# Patient Record
Sex: Female | Born: 1940
Health system: Midwestern US, Community
[De-identification: ages and names within clinical notes are randomized; demographics above are authoritative.]

## PROBLEM LIST (undated history)

## (undated) DIAGNOSIS — R413 Other amnesia: Secondary | ICD-10-CM

## (undated) DIAGNOSIS — R5381 Other malaise: Secondary | ICD-10-CM

## (undated) DIAGNOSIS — K219 Gastro-esophageal reflux disease without esophagitis: Secondary | ICD-10-CM

## (undated) DIAGNOSIS — M542 Cervicalgia: Secondary | ICD-10-CM

## (undated) DIAGNOSIS — F419 Anxiety disorder, unspecified: Secondary | ICD-10-CM

## (undated) DIAGNOSIS — B9620 Unspecified Escherichia coli [E. coli] as the cause of diseases classified elsewhere: Secondary | ICD-10-CM

## (undated) DIAGNOSIS — C44311 Basal cell carcinoma of skin of nose: Secondary | ICD-10-CM

## (undated) DIAGNOSIS — D72829 Elevated white blood cell count, unspecified: Secondary | ICD-10-CM

## (undated) DIAGNOSIS — M549 Dorsalgia, unspecified: Secondary | ICD-10-CM

## (undated) DIAGNOSIS — R9439 Abnormal result of other cardiovascular function study: Secondary | ICD-10-CM

## (undated) DIAGNOSIS — R1312 Dysphagia, oropharyngeal phase: Secondary | ICD-10-CM

## (undated) DIAGNOSIS — M4319 Spondylolisthesis, multiple sites in spine: Secondary | ICD-10-CM

## (undated) DIAGNOSIS — M810 Age-related osteoporosis without current pathological fracture: Principal | ICD-10-CM

## (undated) DIAGNOSIS — M461 Sacroiliitis, not elsewhere classified: Secondary | ICD-10-CM

## (undated) DIAGNOSIS — M8580 Other specified disorders of bone density and structure, unspecified site: Secondary | ICD-10-CM

## (undated) DIAGNOSIS — M8589 Other specified disorders of bone density and structure, multiple sites: Secondary | ICD-10-CM

## (undated) DIAGNOSIS — M545 Low back pain, unspecified: Secondary | ICD-10-CM

## (undated) DIAGNOSIS — J449 Chronic obstructive pulmonary disease, unspecified: Principal | ICD-10-CM

## (undated) DIAGNOSIS — M47816 Spondylosis without myelopathy or radiculopathy, lumbar region: Secondary | ICD-10-CM

## (undated) DIAGNOSIS — R42 Dizziness and giddiness: Secondary | ICD-10-CM

## (undated) DIAGNOSIS — R300 Dysuria: Secondary | ICD-10-CM

## (undated) DIAGNOSIS — E785 Hyperlipidemia, unspecified: Secondary | ICD-10-CM

## (undated) DIAGNOSIS — J432 Centrilobular emphysema: Principal | ICD-10-CM

## (undated) DIAGNOSIS — R0902 Hypoxemia: Secondary | ICD-10-CM

## (undated) DIAGNOSIS — N390 Urinary tract infection, site not specified: Principal | ICD-10-CM

## (undated) DIAGNOSIS — J984 Other disorders of lung: Secondary | ICD-10-CM

## (undated) DIAGNOSIS — R1084 Generalized abdominal pain: Secondary | ICD-10-CM

## (undated) DIAGNOSIS — Z9889 Other specified postprocedural states: Principal | ICD-10-CM

## (undated) DIAGNOSIS — I160 Hypertensive urgency: Secondary | ICD-10-CM

## (undated) DIAGNOSIS — R251 Tremor, unspecified: Secondary | ICD-10-CM

## (undated) DIAGNOSIS — R030 Elevated blood-pressure reading, without diagnosis of hypertension: Secondary | ICD-10-CM

## (undated) DIAGNOSIS — R350 Frequency of micturition: Principal | ICD-10-CM

## (undated) DIAGNOSIS — M48061 Spinal stenosis, lumbar region without neurogenic claudication: Secondary | ICD-10-CM

## (undated) DIAGNOSIS — N3001 Acute cystitis with hematuria: Secondary | ICD-10-CM

## (undated) DIAGNOSIS — J069 Acute upper respiratory infection, unspecified: Principal | ICD-10-CM

## (undated) DIAGNOSIS — Z4789 Encounter for other orthopedic aftercare: Secondary | ICD-10-CM

## (undated) DIAGNOSIS — R1310 Dysphagia, unspecified: Secondary | ICD-10-CM

## (undated) DIAGNOSIS — G8918 Other acute postprocedural pain: Principal | ICD-10-CM

## (undated) DIAGNOSIS — R1906 Epigastric swelling, mass or lump: Secondary | ICD-10-CM

## (undated) DIAGNOSIS — M4807 Spinal stenosis, lumbosacral region: Secondary | ICD-10-CM

## (undated) DIAGNOSIS — Z1231 Encounter for screening mammogram for malignant neoplasm of breast: Secondary | ICD-10-CM

---

## 2011-11-11 NOTE — Telephone Encounter (Signed)
ERROR.

## 2011-11-17 NOTE — Telephone Encounter (Signed)
Patient is on Macrobid 100mg  BID.  She wanted to inform us of this so we will know when she gets her injection in December.

## 2011-11-21 NOTE — Telephone Encounter (Signed)
To add meds to her chart.

## 2011-11-21 NOTE — Telephone Encounter (Signed)
Error.

## 2013-11-06 NOTE — Telephone Encounter (Signed)
Pt scheduled to see Katie Juarez on 11/08/13. Will call for pt's paper chart.

## 2013-11-06 NOTE — Telephone Encounter (Signed)
Pt has not been seen for couple yrs and would like to come back in for an injection.   Please call to schedule

## 2013-11-06 NOTE — Telephone Encounter (Signed)
Left message to call office

## 2013-11-06 NOTE — Telephone Encounter (Signed)
Okay to schedule for H&P. Please provide paper chart

## 2013-11-08 ENCOUNTER — Ambulatory Visit: Admit: 2013-11-08 | Discharge: 2013-11-08 | Payer: MEDICARE | Attending: Nurse Practitioner | Primary: Family Medicine

## 2013-11-08 DIAGNOSIS — M48061 Spinal stenosis, lumbar region without neurogenic claudication: Secondary | ICD-10-CM

## 2013-11-08 NOTE — Progress Notes (Signed)
Subjective:      Patient ID: Katie Juarez is a 73 y.o. female.    HPI Here to schedule L4/L5 IESI. Last received 2013, worked very well. The pain is the same, prefers to schedule the same procedure. Also complaining neck pain, denies cervical radicular symptoms.     Allergies   Allergen Reactions   ??? Pcn [Penicillins]    ??? Zithromax [Azithromycin]        Outpatient Prescriptions Marked as Taking for the 11/08/13 encounter (Office Visit) with [name], NP   Medication Sig Dispense Refill   ??? atorvastatin (LIPITOR) 20 MG tablet      ??? NEXIUM 40 MG capsule      ??? FLUZONE HIGH-DOSE 0.5 ML SUSY injection   0   ??? RA LORATADINE 10 MG tablet   0   ??? PREVNAR 13 SUSP inj   0   ??? Multiple Vitamins-Iron (MULTI-VITAMIN/IRON) TABS Take  by mouth Daily.     ??? CALCIUM CARBONATE-VIT D-MIN PO Take  by mouth 2 times daily.     ??? aspirin 81 MG tablet Take 81 mg by mouth daily.     ??? citalopram (CELEXA) 10 MG tablet Take 10 mg by mouth. 1/2 tablet by mouth at bedtime     ??? LORazepam (ATIVAN) 0.5 MG tablet Take 0.5 mg by mouth. 1 tablet by mouth at bedtime PRN     ??? budesonide-formoterol (SYMBICORT) 160-4.5 MCG/ACT AERO Inhale 1 puff into the lungs 2 times daily.     ??? bisacodyl (DULCOLAX) 5 MG EC tablet Take 5 mg by mouth daily as needed.     ??? polyvinyl alcohol-povidone (HYPOTEARS) 1.4-0.6 % ophthalmic solution Place 1-2 drops into both eyes as needed.     ??? nitrofurantoin, macrocrystal-monohydrate, (MACROBID) 100 MG capsule Take 100 mg by mouth 2 times daily.         Past Medical History   Diagnosis Date   ??? Breast cancer ([place])    ??? SS (spinal stenosis)    ??? DDD (degenerative disc disease)    ??? LBP (low back pain)        Past Surgical History   Procedure Laterality Date   ??? Hysterectomy     ??? Bladder suspension     ??? Cholecystectomy     ??? Breast lumpectomy     ??? Spine surgery  06/23/11, 06/30/11, 07/12/11 12/29/11     L4/L5 IESI     Review of Systems   Constitutional: Positive for fatigue.   Respiratory: Negative for shortness  of breath.    Cardiovascular: Positive for leg swelling. Negative for chest pain.   Gastrointestinal: Positive for constipation.   Genitourinary: Negative for difficulty urinating.   Musculoskeletal: Positive for myalgias, back pain, joint swelling and arthralgias.   Neurological: Positive for weakness and numbness.   Psychiatric/Behavioral: Positive for sleep disturbance.       Objective:   Physical Exam   Constitutional: She is oriented to person, place, and time. Vital signs are normal. She appears well-developed and well-nourished. She is cooperative. No distress.   HENT:   Head: Normocephalic and atraumatic.   Cardiovascular: Normal rate and intact distal pulses.    Pulmonary/Chest: Effort normal. No respiratory distress.   Musculoskeletal:        Lumbar back: She exhibits decreased range of motion, tenderness and pain.   Neurological: She is alert and oriented to person, place, and time. She is not disoriented. No cranial nerve deficit.   Skin: Skin  is warm, dry and intact. She is not diaphoretic. No cyanosis. No pallor. Nails show no clubbing.   Psychiatric: She has a normal mood and affect. Her speech is normal.   Vitals reviewed.      Assessment:      1. Spinal stenosis of lumbar region    2. Midline low back pain without sciatica    3. Degeneration of intervertebral disc of lumbar region           Plan:     Schedule L4/L5 IESI    Informed consent has been obtained for procedure. Roselyn Reef Dishongis  on blood thinners. Medications to hold have been reviewed with patient.Blood thinners must be held with permission from your cardiologist or primary care physician. A letter is  required to besent to PCP/Cardiologist regarding holding medications for procedure to decrease bleeding risk. Blood Thinners (antiplatelet/) medications that must be held prior to spinal cord stimulator trials, and implants, interlaminar epidurals, caudal epidurals, or transforaminal epidural injections:    Antiplatelets Aspirin,  Effient, Plavix,Ticlid,  Brilinta, Savaysa for 3-5 days  Anticoagulant: Coumadin, Eliquis, Lovenox, Heparin, Pradaxa, Xarelto for 5-7 days.  All antiplatelets/anticoagulants must be held for 7 days for a spinal cord stimulator.

## 2013-11-08 NOTE — Patient Instructions (Signed)
[facility]  [address] [place], [place] [address]  Telephone [phone]  Fax [phone]    PROCEDURE INSTRUCTIONS FOR  PAIN MANAGEMENT PROCEDURES WITHOUT SEDATION    You are scheduled to see Dr. Joylene Igo to undergo the following procedure: ___L4/L5 Interlaminar Epidural Steroid Injection___    Procedure Date: __Friday  11/6/15__  Arrival Time: ___7:30 AM___     Report to the [facility], Registration office on the 1st floor in the hospital, after check in and signing of paper work you will then go to the second floor to the surgery center.    1. Stop the following medications prior to the procedure: ASPIRIN  Date_11/3/15_ : Stop blood thinners as directed before the injection, with permission from your cardiologist or primary care physician. We will send a letter to them requesting permission to hold the blood thinners.     Blood Thinners (antiplatelet/) medications that must be held prior to injections:    Antiplatelets: Effient, Plavix,Ticlid,  Brilinta, Savaysa for 3 days  Anticoagulant: Coumadin,Eliquis, Lovenox, Heparin, Pradaxa, Xarelto for 5-7 days.     If you are having an interlaminar epidural, caudal epidural, or transforaminal epidural injection performed, ASA must also be held for 3 days, with permission from your cardiologist or primary care physician.      If you take Warfarin (Coumadin), you must have your blood drawn for an INR the day before the procedure. INR must be less than 1.5.    2.  Take all routine medications unless otherwise instructed. Ok to take vitamins and antiinflammatory medications    3.  EATING & DRINKING:  Ok to eat or drink before the injection - no IV sedation will be used.     4. If you are allergic to contrast or iodine, you must take benadryl and prednisone prior to the injection to prevent an allergic reaction. Follow the directions on the prescription for the times to take the medication.    5. Oral valium can be prescribed if your are  anxious about the injections or feel that you can not lay still during the injection. If you take valium, you must have a driver. Make arrangements for a family member or friend to drive you to the surgery center.  Your ride must stay in the hospital while you are having the injection done. If they cannot stay, the injection will be rescheduled. The valium may affect your judgment following the procedure and driving a vehicle within 24 hours after the sedation could be dangerous.    6.  Wear simple loose clothing, which can be easily changed.      7.  Leave jewelry (including rings) and other valuables at home.     8.  You will be asked to sign several forms prior to surgery; patients under the age of 62 must have a parent or legal guardian sign the permit to be able to do the procedure.      9.  You must have finished any antibiotic prescribed for recent infections. If required, please take pre-procedure antibiotic or other pre-procedure medications as instructed.      10. Bring inhalers and pain medications with you to your procedure.      11. Bring your MRI/CT films if they were done outside of the [facility].    12. If you should develop a cold, sore throat, cough, fever or other new indication of illness or infection prior to the scheduled procedure, please notify the Doctor???s office  as early as possible at [phone].

## 2013-11-14 NOTE — H&P (Signed)
11/08/2013 11:25 AM        Related encounter: Office Visit from 11/08/2013 in [facility]      Attestation signed by Brien Few, MD at 11/11/2013 8:14 AM   I have reviewed the encounter and agree with the above assessment and plan.     Electronically signed by Brien Few, MD on 11/11/2013 at 8:14 AM        Expand All Collapse All   Subjective:    Patient ID: Katie Juarez is a 73 y.o. female.    HPI Here to schedule L4/L5 IESI. Last received 2013, worked very well. The pain is the same, prefers to schedule the same procedure. Also complaining neck pain, denies cervical radicular symptoms.   Allergies    Allergen  Reactions    ???  Pcn [Penicillins]     ???  Zithromax [Azithromycin]        Active Medications    Outpatient Prescriptions Marked as Taking for the 11/08/13 encounter (Office Visit) with [name], NP    Medication  Sig  Dispense  Refill    ???  atorvastatin (LIPITOR) 20 MG tablet       ???  NEXIUM 40 MG capsule       ???  FLUZONE HIGH-DOSE 0.5 ML SUSY injection    0    ???  RA LORATADINE 10 MG tablet    0    ???  PREVNAR 13 SUSP inj    0    ???  Multiple Vitamins-Iron (MULTI-VITAMIN/IRON) TABS  Take by mouth Daily.      ???  CALCIUM CARBONATE-VIT D-MIN PO  Take by mouth 2 times daily.      ???  aspirin 81 MG tablet  Take 81 mg by mouth daily.      ???  citalopram (CELEXA) 10 MG tablet  Take 10 mg by mouth. 1/2 tablet by mouth at bedtime      ???  LORazepam (ATIVAN) 0.5 MG tablet  Take 0.5 mg by mouth. 1 tablet by mouth at bedtime PRN      ???  budesonide-formoterol (SYMBICORT) 160-4.5 MCG/ACT AERO  Inhale 1 puff into the lungs 2 times daily.      ???  bisacodyl (DULCOLAX) 5 MG EC tablet  Take 5 mg by mouth daily as needed.      ???  polyvinyl alcohol-povidone (HYPOTEARS) 1.4-0.6 % ophthalmic solution  Place 1-2 drops into both eyes as needed.      ???  nitrofurantoin, macrocrystal-monohydrate, (MACROBID) 100 MG capsule  Take 100 mg by mouth 2 times daily.            Past Medical History    Past Medical  History    Diagnosis  Date    ???  Breast cancer ([place])     ???  SS (spinal stenosis)     ???  DDD (degenerative disc disease)     ???  LBP (low back pain)           Past Surgical History    Past Surgical History    Procedure  Laterality  Date    ???  Hysterectomy      ???  Bladder suspension      ???  Cholecystectomy      ???  Breast lumpectomy      ???  Spine surgery   06/23/11, 06/30/11, 07/12/11 12/29/11      L4/L5 IESI         Review of  Systems   Constitutional: Positive for fatigue.   Respiratory: Negative for shortness of breath.   Cardiovascular: Positive for leg swelling. Negative for chest pain.   Gastrointestinal: Positive for constipation.   Genitourinary: Negative for difficulty urinating.   Musculoskeletal: Positive for myalgias, back pain, joint swelling and arthralgias.   Neurological: Positive for weakness and numbness.   Psychiatric/Behavioral: Positive for sleep disturbance.     Objective:    Physical Exam   Constitutional: She is oriented to person, place, and time. Vital signs are normal. She appears well-developed and well-nourished. She is cooperative. No distress.   HENT:   Head: Normocephalic and atraumatic.   Cardiovascular: Normal rate and intact distal pulses.   Pulmonary/Chest: Effort normal. No respiratory distress.   Musculoskeletal:   Lumbar back: She exhibits decreased range of motion, tenderness and pain.   Neurological: She is alert and oriented to person, place, and time. She is not disoriented. No cranial nerve deficit.   Skin: Skin is warm, dry and intact. She is not diaphoretic. No cyanosis. No pallor. Nails show no clubbing.   Psychiatric: She has a normal mood and affect. Her speech is normal.   Vitals reviewed.    Assessment:       1.  Spinal stenosis of lumbar region     2.  Midline low back pain without sciatica    3.  Degeneration of intervertebral disc of lumbar region           Plan:      Schedule L4/L5 IESI    Informed consent has been obtained for procedure. Roselyn Reef Dishongis  on blood  thinners. Medications to hold have been reviewed with patient.Blood thinners must be held with permission from your cardiologist or primary care physician. A letter is required to besent to PCP/Cardiologist regarding holding medications for procedure to decrease bleeding risk. Blood Thinners (antiplatelet/) medications that must be held prior to spinal cord stimulator trials, and implants, interlaminar epidurals, caudal epidurals, or transforaminal epidural injections:   Antiplatelets Aspirin, Effient, Plavix,Ticlid, Brilinta, Savaysa for 3-5 days  Anticoagulant: Coumadin, Eliquis, Lovenox, Heparin, Pradaxa, Xarelto for 5-7 days.  All antiplatelets/anticoagulants must be held for 7 days for a spinal cord stimulator.

## 2013-11-15 ENCOUNTER — Encounter: Admit: 2013-11-15 | Attending: Physical Medicine & Rehabilitation | Primary: Family Medicine

## 2013-11-15 ENCOUNTER — Encounter: Admit: 2013-11-15 | Primary: Family Medicine

## 2013-11-15 MED ORDER — BUFFERED LIDOCAINE 1%
Status: AC
Start: 2013-11-15 — End: ?

## 2013-11-15 MED ORDER — BUPIVACAINE HCL (PF) 0.5 % IJ SOLN
0.5 % | INTRAMUSCULAR | Status: AC
Start: 2013-11-15 — End: ?

## 2013-11-15 MED ORDER — DEXAMETHASONE SODIUM PHOSPHATE 10 MG/ML IJ SOLN
10 MG/ML | INTRAMUSCULAR | Status: AC
Start: 2013-11-15 — End: ?

## 2013-11-15 MED FILL — DEXAMETHASONE SODIUM PHOSPHATE 10 MG/ML IJ SOLN: 10 MG/ML | INTRAMUSCULAR | Qty: 2

## 2013-11-15 MED FILL — BUFFERED LIDOCAINE 1%: Qty: 20

## 2013-11-15 MED FILL — BUPIVACAINE HCL (PF) 0.5 % IJ SOLN: 0.5 % | INTRAMUSCULAR | Qty: 30

## 2013-11-15 NOTE — Discharge Instructions (Signed)
Home Care after Epidural Steroid Injection    The doctor has done an injection in your back to see if those nerves are causing your pain and to decrease pain and inflammation.     You may feel sore at the injection site for the next 2-4 days. You may apply ice to the site for 20 minutes on and 20 minutes off to decrease pain and discomfort, if needed, for the first 24 hours. After 24 hours, you may use heat if needed.    Your pain may subside right away, or it may take a number of days. This is because two medicines were used in the injection. The first, a local anesthetic, will only work for a few hours. The second, a steroid, may not start working for 2-5 days. Some patients have noticed no changes in their pain for up to 2 weeks.    There may be a time after the local anesthetic wears off that you feel like you have more pain. This is called pain flare. If this happens:  . Limit your activities for the first 24 hours to those that you can do without pain.  . Keep on taking your pain medicine as prescribed.    Sometimes, some patients have had facial and neck flushing, anxiety or nervousness, and mood swings with the use of steroids. These symptoms most often occur within the first 24-48 hours and do not require any treatment. They should go away on their own within one week.    If you have diabetes, steroids will cause your blood sugar to increase. Make sure your primary doctor is aware of this and that you have orders to treat your blood sugar to keep it within your normal range.    You may have some weakness for the next 3-5 hours due to the anesthetic used. Take it easy. No baths or soaking the injection site for 24 hours after the procedure. Taking a shower is okay.    You may resume taking your routine medicines after the procedure including pain medicines as prescribed. Resume any medications held for the procedure (blood thinners, aspirin, anti-inflammatories).    If you do not already have a  follow-up appointment, call your referring doctor's office to make one to discuss your results within 2-4 weeks after the treatment. Your doctor will have the report within 7-10 days.    You will be given a pain log to complete for the next 14 days. Complete this form and make a copy for your own records. Then, mail it back to us or drop it off at the pain management clinic. We will need this information to decide the next step in your treatment plan.    Signs of infection:  . Fever greater than 100.4F by mouth for 2 readings taken 4 hours apart  . Increased redness, swelling around the site  . Any drainage from the site     If you have any new symptoms or any signs of infection, please call [phone] during business hours to notify us. You can also notify your primary care physician.After hours, nights and weekends, call [phone].

## 2013-11-15 NOTE — Interval H&P Note (Signed)
I have interviewed and examined the patient and reviewed the recent History and Physical.  There have been no changes to the recent H&P documentation. The surgical consent form has been signed.      Outpatient Prescriptions Marked as Taking for the 11/15/13 encounter [facility] Encounter) with Brien Few, MD   Medication Sig Dispense Refill   ??? NEXIUM 40 MG capsule      ??? RA LORATADINE 10 MG tablet   0   ??? Multiple Vitamins-Iron (MULTI-VITAMIN/IRON) TABS Take  by mouth Daily.     ??? CALCIUM CARBONATE-VIT D-MIN PO Take  by mouth 2 times daily.     ??? citalopram (CELEXA) 10 MG tablet Take 10 mg by mouth. 1/2 tablet by mouth at bedtime     ??? LORazepam (ATIVAN) 0.5 MG tablet Take 0.5 mg by mouth. 1 tablet by mouth at bedtime PRN     ??? budesonide-formoterol (SYMBICORT) 160-4.5 MCG/ACT AERO Inhale 1 puff into the lungs 2 times daily.     ??? nitrofurantoin, macrocrystal-monohydrate, (MACROBID) 100 MG capsule Take 100 mg by mouth 2 times daily.         The patient understands the planned operation and its associated risks and benefits and agrees to proceed.        Electronically signed by Brien Few, MD on 11/15/2013 at 8:26 AM

## 2013-11-15 NOTE — Op Note (Signed)
INTERLAMINAR EPIDURAL STEROID INJECTION    11/15/13    Surgeon: Brien Few, MD    Pre-operative Diagnosis:   Active Hospital Problems    Diagnosis Date Noted   ??? SS (spinal stenosis) [M48.00]    ??? DDD (degenerative disc disease) [IMO0002]    ??? Low back pain [M54.5]        Post-operative Diagnosis: Same    Assistants: none    This is a 73 y.o. female patient with pain in the Back.?? Pain intensity is 7 on a 0 to 10 numeric pain scale.  Previous treatment and examination findings are noted in the H&P.??L4/L5 interlaminar epidural injection has been requested for diagnostic and therapeutic reasons.    EXAMINATION:  1. L4/L5 Interlaminar radiculogram/epidurogram.   2. L4/L5 Interlaminar epidural anesthetic injection.   3. L4/L5 Interlaminar epidural steroid injection.    CONSENT: Written consent was obtained from the patient on preprinted consent form after explaining the procedure, indications, potential complications and outcomes. Alternative treatments were also discussed.    DISCUSSION: The patient was sterilely prepped and draped in the usual fashion in the prone position. ???Time out??? was verified for correct patient, side, level and procedure.    SEDATION:   No conscious sedation was performed during the procedure.?? The patient remained awake and conversed throughout the procedure.?? The patient underwent pulse oximetry and blood pressure monitoring independently by a trained observer, as well as by a physician.    PROCEDURE:  Under image-intensifier control, a 22 gauge needle x 3.5 inch spinal needle was guided successfully into the epidural space employing a posterior midline interlaminar approach. Needle aspiration was negative for heme or CSF.Instillation of  .5 mL of Omnipaque 240 contrast medium opacified the spinal nerve and demonstrated contiguous flow into the epidural space. No vascular spread was noted. Digital subtraction was not employed to evaluate for vascular spread. The patient was monitored  for any untoward reaction to contrast medium before proceeding with procedure #2. The patient did report pain reproduction in a concordant distribution.    Following needle position verification, a test dose of  1 mL of sterile normal saline was administered and patient monitored for any adverse effects. Then, 1.5 mL of dexamethasone (Decadron 10 mg/mL) was instilled into the epidural space and the patient's response was again monitored.  Finally, 56ml of 0.5% bupivacaine was then instilled. The patient's response was again monitored.    The spinal needle was removed, and the patient's pain response, gait pattern, sensorimotor examination and range of motion were examined.    The patient tolerated the procedure well and without complications and was noted to be in stable condition prior to discharge from the procedure center with discharge instructions.    IMPRESSIONS:  1. L4/L5 Interlaminar epidurogram, epidural anesthesia and epidural steroid injection procedures accomplished without incident.     EBL: no blood loss    SPECIMEN: none    RECOMMENDATIONS:  1. Complete and return ???Post-Procedure Pain and Activity Diary.???   2. Contact the [facility] for symptom exacerbation, fever or unusual symptoms.   3. Post-procedure care according to verbal and written discharge instructions    POST-PROCEDURE EPIDUROGRAPHY INTERPRETATION:    EXAMINATION: AP, lateral views.    FLUORO TIME: 23 seconds    DISCUSSION: Spot views of the spine reveal normal alignment and segmentation.  Spinal needle is positioned at the L4/L5 interlaminar space. Contrast spreads and outlines the epidural space. The epidurogram reveals good contrast flow. Visualized spine reveals See radiology  report.Soft tissues reveal no abnormalities.    IMPRESSION: L4/L5 interlaminar epidurogram/epineurogram reveals satisfactory needle position and contrast spread.     Electronically signed by Brien Few, MD on 11/15/2013 at 8:29 AM

## 2013-11-15 NOTE — Other (Signed)
Xray 23 seconds  Procedure time 5 minutes

## 2013-11-19 LAB — SURGICAL PATHOLOGY REPORT

## 2013-12-02 NOTE — Telephone Encounter (Signed)
Patient called c/o worsening LBP and trouble with neck pain. Stated that she will be leaving for [place] on 12/20/13 and will be gone until April. Please advise what patient should do since she had her injections on 11/6 & 11/11.  [phone]

## 2013-12-03 NOTE — Telephone Encounter (Signed)
What injection did she have on 11/20/2013? Recommend sooner appt, do discuss her neck pain and any other recommendations for her back

## 2013-12-03 NOTE — Telephone Encounter (Signed)
Patient didn't have procedure on 11/20/13, she had a L4/L5 IESI on 11/15/13 with no f/u scheduled.  She was originally a referral for injection therapy.  At [facility] surgery center our soonest available is on 12/27/13, but we do have [facility] surgery center next Wednesday.  They are leaving for [place] on 12/20/13 and wouldn't be able to get into our [place] surgery center.  I called patient to see if she was interested in [place] surgery center but she wasn't available, and I spoke to her husband.  He stated that if she cannot have another procedure done prior to leaving for [place] she would like to discuss pain meds.  She is to call our office back.

## 2013-12-04 ENCOUNTER — Ambulatory Visit: Admit: 2013-12-04 | Payer: MEDICARE | Primary: Family Medicine

## 2013-12-04 ENCOUNTER — Ambulatory Visit: Admit: 2013-12-04 | Discharge: 2013-12-04 | Payer: MEDICARE | Attending: Nurse Practitioner | Primary: Family Medicine

## 2013-12-04 DIAGNOSIS — M5136 Other intervertebral disc degeneration, lumbar region: Secondary | ICD-10-CM

## 2013-12-04 MED ORDER — IBUPROFEN 600 MG PO TABS
600 MG | ORAL_TABLET | Freq: Three times a day (TID) | ORAL | Status: DC | PRN
Start: 2013-12-04 — End: 2017-04-24

## 2013-12-04 NOTE — Progress Notes (Signed)
Subjective:      Patient ID: Katie Juarez is a 73 y.o. female.    HPI L4/L5 IESI 11/14/13, helped initially the first 3 days, pain gradually returning. She had the same injection 2013, the pain is the same, no change in pain. Taking Tylenol/Motrin PRN. Leaving December 11th for [place]. Also complaining neck pain, radiates upwards causing headache. Denies radicular complaints. Back pain is low back, radiates into the buttocks. Denies groin pain.     Allergies   Allergen Reactions   ??? Pcn [Penicillins]    ??? Zithromax [Azithromycin]        Outpatient Prescriptions Marked as Taking for the 12/04/13 encounter (Office Visit) with [name], NP   Medication Sig Dispense Refill   ??? vitamin B-12 (CYANOCOBALAMIN) 1000 MCG tablet Take 1,000 mcg by mouth daily     ??? atorvastatin (LIPITOR) 20 MG tablet      ??? NEXIUM 40 MG capsule      ??? FLUZONE HIGH-DOSE 0.5 ML SUSY injection   0   ??? RA LORATADINE 10 MG tablet   0   ??? PREVNAR 13 SUSP inj   0   ??? Multiple Vitamins-Iron (MULTI-VITAMIN/IRON) TABS Take  by mouth Daily.     ??? CALCIUM CARBONATE-VIT D-MIN PO Take  by mouth 2 times daily.     ??? aspirin 81 MG tablet Take 81 mg by mouth daily.     ??? citalopram (CELEXA) 10 MG tablet Take 10 mg by mouth. 1/2 tablet by mouth at bedtime     ??? LORazepam (ATIVAN) 0.5 MG tablet Take 0.5 mg by mouth. 1 tablet by mouth at bedtime PRN     ??? budesonide-formoterol (SYMBICORT) 160-4.5 MCG/ACT AERO Inhale 1 puff into the lungs 2 times daily.     ??? bisacodyl (DULCOLAX) 5 MG EC tablet Take 5 mg by mouth daily as needed.     ??? polyvinyl alcohol-povidone (HYPOTEARS) 1.4-0.6 % ophthalmic solution Place 1-2 drops into both eyes as needed.     ??? nitrofurantoin, macrocrystal-monohydrate, (MACROBID) 100 MG capsule Take 100 mg by mouth 2 times daily.         Past Medical History   Diagnosis Date   ??? Breast cancer ([place])    ??? SS (spinal stenosis)    ??? DDD (degenerative disc disease)    ??? LBP (low back pain)        Past Surgical History   Procedure  Laterality Date   ??? Hysterectomy     ??? Bladder suspension     ??? Cholecystectomy     ??? Breast lumpectomy     ??? Spine surgery  06/23/11, 06/30/11, 07/12/11 12/29/11     L4/L5 IESI   ??? Other surgical history  11/14/2013     L4/L5 IESI     Review of Systems   Constitutional: Positive for fatigue.   Respiratory: Negative for shortness of breath.    Cardiovascular: Positive for leg swelling. Negative for chest pain.   Gastrointestinal: Positive for constipation.   Genitourinary: Negative for difficulty urinating.   Musculoskeletal: Positive for myalgias, back pain, joint swelling, arthralgias and neck pain.   Neurological: Positive for weakness, numbness and headaches.   Psychiatric/Behavioral: Positive for sleep disturbance.       Objective:   Physical Exam   Constitutional: She is oriented to person, place, and time. Vital signs are normal. She appears well-developed and well-nourished. She is cooperative. No distress.   HENT:   Head: Normocephalic and atraumatic.  Cardiovascular: Normal rate and intact distal pulses.    Pulmonary/Chest: Effort normal. No respiratory distress.   Musculoskeletal:        Lumbar back: She exhibits decreased range of motion, tenderness and pain.        Back:    Neurological: She is alert and oriented to person, place, and time. She is not disoriented. No cranial nerve deficit.   Skin: Skin is warm, dry and intact. She is not diaphoretic. No cyanosis. No pallor. Nails show no clubbing.   Psychiatric: She has a normal mood and affect. Her speech is normal.   Vitals reviewed.      Assessment:      1. Degeneration of intervertebral disc of lumbar region    2. Lumbar spinal stenosis    3. Cervicalgia             Plan:    Schedule repeat L4/L5 IESI vs L5/S1 (as the pain seems lower, radiates into buttocks), although it has been two years since previous set of injections, L4/L5 level may be so flared up one injection wasn't going to work. Recommend updated lumbar imaging to rule out worsening of that  level and changes. Cspine Xray followed by cervical MRI as she is also interested in cervical injections.    Informed consent has been obtained for procedure. Roselyn Reef Dishongis  on blood thinners. Medications to hold have been reviewed with patient.Blood thinners must be held with permission from your cardiologist or primary care physician. A letter is  required to besent to PCP/Cardiologist regarding holding medications for procedure to decrease bleeding risk. Blood Thinners (antiplatelet/) medications that must be held prior to spinal cord stimulator trials, and implants, interlaminar epidurals, caudal epidurals, or transforaminal epidural injections:    Antiplatelets Aspirin, Effient, Plavix,Ticlid,  Brilinta, Savaysa for 3-5 days  Anticoagulant: Coumadin, Eliquis, Lovenox, Heparin, Pradaxa, Xarelto for 5-7 days.  All antiplatelets/anticoagulants must be held for 7 days for a spinal cord stimulator.

## 2013-12-04 NOTE — Telephone Encounter (Signed)
Evaluated in clinic 12/04/13

## 2013-12-04 NOTE — Patient Instructions (Addendum)
Okay to take Motrin 600mg  up to 3x's per day        [facility]  [address] [place], [place] [address]  Telephone [phone]  Fax [phone]    PROCEDURE INSTRUCTIONS FOR  PAIN MANAGEMENT PROCEDURES WITHOUT IV SEDATION    Katie Juarez scheduled to see Dr. Eulah Citizen to undergo the following procedure:  L4/L5 IESI vs L5/S1 IESI    Procedure Date: _____12/8/15_____  Arrival Time: _____07:00AM_____     Report to the [facility], Registration office on the 1st floor in the hospital, after check in and signing of paper work you will then go to the second floor to the surgery center.    1. Stop the following medications prior to the procedure:  Date__12/1/15__ : Stop blood thinners as directed before the injection, with permission from your cardiologist or primary care physician. We will send a letter to them requesting permission to hold the blood thinners.     ?? Medication____Aspirin____ held for __7__ days    If you take Warfarin (Coumadin), you must have your blood drawn for an INR the day before the procedure. INR must be less than 1.5.    2.  Take all routine medications unless otherwise instructed. Ok to take vitamins and antiinflammatory medications    3.  EATING & DRINKING:  Ok to eat or drink before the injection - no IV sedation will be used.     4. If you are allergic to contrast or iodine, you must take benadryl and prednisone prior to the injection to prevent an allergic reaction. Follow the directions on the prescription for the times to take the medication.    5. Oral valium can be prescribed if your are anxious about the injections or feel that you can not lay still during the injection. If you take valium, you must have a driver. Make arrangements for a family member or friend to drive you to the surgery center.  Your ride must stay in the hospital while you are having the injection done. If they cannot stay, the injection will be rescheduled. The valium may affect  your judgment following the procedure and driving a vehicle within 24 hours after the sedation could be dangerous.    6.  Wear simple loose clothing, which can be easily changed.      7.  Leave jewelry (including rings) and other valuables at home.     8.  You will be asked to sign several forms prior to surgery; patients under the age of 23 must have a parent or legal guardian sign the permit to be able to do the procedure.      9.  You must have finished any antibiotic prescribed for recent infections. If required, please take pre-procedure antibiotic or other pre-procedure medications as instructed.      10. Bring inhalers and pain medications with you to your procedure.      11. Bring your MRI/CT films if they were done outside of the [facility].    12. If you should develop a cold, sore throat, cough, fever or other new indication of illness or infection, or are started on antibiotics within 2 weeks of the scheduled procedure, please notify the Doctor???s office as early as possible at [phone].          ORAL SEDATION:  Ativan  take 1 tablet 12 hours prior to procedure (at 0800PM), may take 1 additional tablet 2 hours prior to procedure (  at 06:00AM) if needed.  MUST HAVE A DRIVER TO AND FROM THE PROCEDURE!!!

## 2013-12-12 ENCOUNTER — Encounter

## 2013-12-13 ENCOUNTER — Ambulatory Visit: Admit: 2013-12-13 | Payer: MEDICARE | Primary: Family Medicine

## 2013-12-17 ENCOUNTER — Encounter: Admit: 2013-12-17 | Primary: Family Medicine

## 2013-12-17 ENCOUNTER — Inpatient Hospital Stay: Payer: MEDICARE | Attending: Physical Medicine & Rehabilitation | Primary: Family Medicine

## 2013-12-17 ENCOUNTER — Encounter: Admit: 2013-12-17 | Attending: Physical Medicine & Rehabilitation | Primary: Family Medicine

## 2013-12-17 DIAGNOSIS — M4319 Spondylolisthesis, multiple sites in spine: Secondary | ICD-10-CM

## 2013-12-17 MED ORDER — BUPIVACAINE HCL (PF) 0.5 % IJ SOLN
0.5 % | INTRAMUSCULAR | Status: AC
Start: 2013-12-17 — End: ?

## 2013-12-17 MED ORDER — BUFFERED LIDOCAINE 1%
Status: AC
Start: 2013-12-17 — End: ?

## 2013-12-17 MED FILL — BUFFERED LIDOCAINE 1%: Qty: 20

## 2013-12-17 MED FILL — BUPIVACAINE HCL (PF) 0.5 % IJ SOLN: 0.5 % | INTRAMUSCULAR | Qty: 30

## 2013-12-17 NOTE — Interval H&P Note (Signed)
I have interviewed and examined the patient and reviewed the recent History and Physical.  There have been no changes to the recent H&P documentation. The surgical consent form has been signed.    Outpatient Prescriptions Marked as Taking for the 12/17/13 encounter [facility] Encounter) with Brien Few, MD   Medication Sig Dispense Refill   ??? NEXIUM 40 MG capsule      ??? Multiple Vitamins-Iron (MULTI-VITAMIN/IRON) TABS Take  by mouth Daily.     ??? CALCIUM CARBONATE-VIT D-MIN PO Take  by mouth 2 times daily.     ??? aspirin 81 MG tablet Take 81 mg by mouth daily.     ??? citalopram (CELEXA) 10 MG tablet Take 10 mg by mouth. 1/2 tablet by mouth at bedtime     ??? LORazepam (ATIVAN) 0.5 MG tablet Take 0.5 mg by mouth. 1 tablet by mouth at bedtime PRN     ??? budesonide-formoterol (SYMBICORT) 160-4.5 MCG/ACT AERO Inhale 1 puff into the lungs 2 times daily.     ??? bisacodyl (DULCOLAX) 5 MG EC tablet Take 5 mg by mouth daily as needed.     ??? polyvinyl alcohol-povidone (HYPOTEARS) 1.4-0.6 % ophthalmic solution Place 1-2 drops into both eyes as needed.     ??? nitrofurantoin, macrocrystal-monohydrate, (MACROBID) 100 MG capsule Take 100 mg by mouth 2 times daily.         The patient understands the planned operation and its associated risks and benefits and agrees to proceed.        Electronically signed by Brien Few, MD on 12/17/2013 at 8:20 AM

## 2013-12-17 NOTE — Op Note (Signed)
INTERLAMINAR EPIDURAL STEROID INJECTION    12/17/13    Surgeon: Brien Few, MD    Pre-operative Diagnosis:   Active Hospital Problems    Diagnosis Date Noted   ??? 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 DDD (degenerative disc disease) [[id number]]        Post-operative Diagnosis: Same    Assistants: none    This is a 73 y.o. female patient with pain in the Back.?? Pain intensity is 6 on a 0 to 10 numeric pain scale.  Previous treatment and examination findings are noted in the H&P.??  L5/S1 interlaminar epidural injection has been requested for diagnostic and therapeutic reasons.    EXAMINATION:  1. L5/S1 Interlaminar radiculogram/epidurogram.   2. L5/S1 Interlaminar epidural anesthetic injection.   3. L5/S1 Interlaminar epidural steroid injection.    CONSENT: Written consent was obtained from the patient on preprinted consent form after explaining the procedure, indications, potential complications and outcomes. Alternative treatments were also discussed.    DISCUSSION: The patient was sterilely prepped and draped in the usual fashion in the prone position. ???Time out??? was verified for correct patient, side, level and procedure.    SEDATION: No conscious sedation was performed during the procedure.?? The patient remained awake and conversed throughout the procedure.?? The patient underwent pulse oximetry and blood pressure monitoring independently by a trained observer, as well as by a physician.    PROCEDURE:  Under image-intensifier control, a 22 gauge needle x 3.5 inch spinal needle was guided successfully into the epidural space employing a posterior midline  interlaminar approach. Needle aspiration was negative for heme or CSF.Instillation of  1 mL of Omnipaque 240 contrast medium opacified the spinal nerve and demonstrated contiguous flow into the epidural space. No vascular spread was noted. Digital subtraction was not employed to evaluate for  vascular spread. The patient was monitored for any untoward reaction to contrast medium before proceeding with procedure #2. The patient did not report pain reproduction in a concordant distribution.    Following needle position verification, a test dose of  1 mL of sterile normal saline was administered and patient monitored for any adverse effects. Then, 1.5 mL of dexamethasone (Decadron 10 mg/mL) was instilled into the epidural space and the patient's response was again monitored.  Finally, 67ml of 0.5% bupivacaine was then instilled. The patient's response was again monitored.    The spinal needle was removed, and the patient's pain response, gait pattern, sensorimotor examination and range of motion were examined.    The patient tolerated the procedure well and without complications and was noted to be in stable condition prior to discharge from the procedure center with discharge instructions.    IMPRESSIONS:  1. L5/S1 Interlaminar epidurogram, epidural anesthesia and epidural steroid injection procedures accomplished without incident.     EBL: no blood loss    SPECIMEN: none    RECOMMENDATIONS:  1. Complete and return ???Post-Procedure Pain and Activity Diary.???   2. Contact the [facility] for symptom exacerbation, fever or unusual symptoms.   3. Post-procedure care according to verbal and written discharge instructions    POST-PROCEDURE EPIDUROGRAPHY INTERPRETATION:    EXAMINATION: AP, lateral views.    FLUORO TIME: 19 seconds    DISCUSSION: Spot views of the spine reveal normal alignment and segmentation.  Spinal needle is positioned at the L5/S1 interlaminar space. Contrast spreads and outlines the epidural space. The epidurogram reveals good contrast flow. Visualized  spine reveals Spondylolisthesis- L3, L4, L5, S1, Degenerative Joint Disease- L3, L4, L5, S1.Soft tissues reveal no abnormalities.    IMPRESSION:L5/S1  interlaminar epidurogram/epineurogram reveals satisfactory needle position and  contrast spread.     Electronically signed by Brien Few, MD on 12/17/2013 at 7:59 AM

## 2013-12-17 NOTE — Discharge Instructions (Signed)
Home Care after Epidural Steroid Injection    The doctor has done an injection in your back to see if those nerves are causing your pain and to decrease pain and inflammation.     You may feel sore at the injection site for the next 2-4 days. You may apply ice to the site for 20 minutes on and 20 minutes off to decrease pain and discomfort, if needed, for the first 24 hours. After 24 hours, you may use heat if needed.    Your pain may subside right away, or it may take a number of days. This is because two medicines were used in the injection. The first, a local anesthetic, will only work for a few hours. The second, a steroid, may not start working for 2-5 days. Some patients have noticed no changes in their pain for up to 2 weeks.    There may be a time after the local anesthetic wears off that you feel like you have more pain. This is called pain flare. If this happens:  . Limit your activities for the first 24 hours to those that you can do without pain.  . Keep on taking your pain medicine as prescribed.    Sometimes, some patients have had facial and neck flushing, anxiety or nervousness, and mood swings with the use of steroids. These symptoms most often occur within the first 24-48 hours and do not require any treatment. They should go away on their own within one week.    If you have diabetes, steroids will cause your blood sugar to increase. Make sure your primary doctor is aware of this and that you have orders to treat your blood sugar to keep it within your normal range.    You may have some weakness for the next 3-5 hours due to the anesthetic used. Take it easy. No baths or soaking the injection site for 24 hours after the procedure. Taking a shower is okay.    You may resume taking your routine medicines after the procedure including pain medicines as prescribed. Resume any medications held for the procedure (blood thinners, aspirin, anti-inflammatories).    If you do not already have a  follow-up appointment, call your referring doctor's office to make one to discuss your results within 2-4 weeks after the treatment. Your doctor will have the report within 7-10 days.    You will be given a pain log to complete for the next 14 days. Complete this form and make a copy for your own records. Then, mail it back to us or drop it off at the pain management clinic. We will need this information to decide the next step in your treatment plan.    Signs of infection:  . Fever greater than 100.4F by mouth for 2 readings taken 4 hours apart  . Increased redness, swelling around the site  . Any drainage from the site     If you have any new symptoms or any signs of infection, please call [phone] during business hours to notify us. You can also notify your primary care physician.After hours, nights and weekends, call [phone].

## 2013-12-17 NOTE — H&P (Signed)
[name], NP Nurse Practitioner Attested  Progress Notes 12/04/2013 10:10 AM   Related encounter: Office Visit from 12/04/2013 in [facility]      Attestation signed by Brien Few, MD at 12/09/2013 8:17 AM   I have reviewed the encounter and agree with the above assessment and plan.     Electronically signed by Brien Few, MD on 12/09/2013 at 8:17 AM            Expand All Collapse All   Subjective:        Patient ID: Katie Juarez is a 73 y.o. female.    HPI L4/L5 IESI 11/14/13, helped initially the first 3 days, pain gradually returning. She had the same injection 2013, the pain is the same, no change in pain. Taking Tylenol/Motrin PRN. Leaving December 11th for [place]. Also complaining neck pain, radiates upwards causing headache. Denies radicular complaints. Back pain is low back, radiates into the buttocks. Denies groin pain.       Allergies    Allergen  Reactions    ???  Pcn [Penicillins]      ???  Zithromax [Azithromycin]             Active Medications    Outpatient Prescriptions Marked as Taking for the 12/04/13 encounter (Office Visit) with [name], NP    Medication  Sig  Dispense  Refill    ???  vitamin B-12 (CYANOCOBALAMIN) 1000 MCG tablet  Take 1,000 mcg by mouth daily        ???  atorvastatin (LIPITOR) 20 MG tablet          ???  NEXIUM 40 MG capsule          ???  FLUZONE HIGH-DOSE 0.5 ML SUSY injection      0    ???  RA LORATADINE 10 MG tablet      0    ???  PREVNAR 13 SUSP inj      0    ???  Multiple Vitamins-Iron (MULTI-VITAMIN/IRON) TABS  Take  by mouth Daily.        ???  CALCIUM CARBONATE-VIT D-MIN PO  Take  by mouth 2 times daily.        ???  aspirin 81 MG tablet  Take 81 mg by mouth daily.        ???  citalopram (CELEXA) 10 MG tablet  Take 10 mg by mouth. 1/2 tablet by mouth at bedtime        ???  LORazepam (ATIVAN) 0.5 MG tablet  Take 0.5 mg by mouth. 1 tablet by mouth at bedtime PRN        ???  budesonide-formoterol (SYMBICORT) 160-4.5 MCG/ACT AERO  Inhale 1 puff into the lungs 2  times daily.        ???  bisacodyl (DULCOLAX) 5 MG EC tablet  Take 5 mg by mouth daily as needed.        ???  polyvinyl alcohol-povidone (HYPOTEARS) 1.4-0.6 % ophthalmic solution  Place 1-2 drops into both eyes as needed.        ???  nitrofurantoin, macrocrystal-monohydrate, (MACROBID) 100 MG capsule  Take 100 mg by mouth 2 times daily.                  Past Medical History    Past Medical History    Diagnosis  Date    ???  Breast cancer ([place])      ???  SS (spinal stenosis)      ???  DDD (degenerative disc disease)      ???  LBP (low back pain)                Past Surgical History    Past Surgical History    Procedure  Laterality  Date    ???  Hysterectomy        ???  Bladder suspension        ???  Cholecystectomy        ???  Breast lumpectomy        ???  Spine surgery    06/23/11, 06/30/11, 07/12/11 12/29/11        L4/L5 IESI    ???  Other surgical history    11/14/2013        L4/L5 IESI         Review of Systems   Constitutional: Positive for fatigue.   Respiratory: Negative for shortness of breath.    Cardiovascular: Positive for leg swelling. Negative for chest pain.   Gastrointestinal: Positive for constipation.   Genitourinary: Negative for difficulty urinating.   Musculoskeletal: Positive for myalgias, back pain, joint swelling, arthralgias and neck pain.   Neurological: Positive for weakness, numbness and headaches.   Psychiatric/Behavioral: Positive for sleep disturbance.        Objective:    Physical Exam   Constitutional: She is oriented to person, place, and time. Vital signs are normal. She appears well-developed and well-nourished. She is cooperative. No distress.   HENT:    Head: Normocephalic and atraumatic.   Cardiovascular: Normal rate and intact distal pulses.    Pulmonary/Chest: Effort normal. No respiratory distress.   Musculoskeletal:        Lumbar back: She exhibits decreased range of motion, tenderness and pain.        Back:    Neurological: She is alert and oriented to person, place, and time. She is not disoriented. No  cranial nerve deficit.   Skin: Skin is warm, dry and intact. She is not diaphoretic. No cyanosis. No pallor. Nails show no clubbing.   Psychiatric: She has a normal mood and affect. Her speech is normal.   Vitals reviewed.        Assessment:       1.  Degeneration of intervertebral disc of lumbar region     2.  Lumbar spinal stenosis     3.  Cervicalgia                        Plan:     Schedule repeat L4/L5 IESI vs L5/S1 (as the pain seems lower, radiates into buttocks), although it has been two years since previous set of injections, L4/L5 level may be so flared up one injection wasn't going to work. Recommend updated lumbar imaging to rule out worsening of that level and changes. Cspine Xray followed by cervical MRI as she is also interested in cervical injections.    Informed consent has been obtained for procedure. Roselyn Reef Dishongis  on blood thinners. Medications to hold have been reviewed with patient.Blood thinners must be held with permission from your cardiologist or primary care physician. A letter is  required to besent to PCP/Cardiologist regarding holding medications for procedure to decrease bleeding risk. Blood Thinners (antiplatelet/) medications that must be held prior to spinal cord stimulator trials, and implants, interlaminar epidurals, caudal epidurals, or transforaminal epidural injections:     Antiplatelets Aspirin, Effient, Plavix,Ticlid,  Brilinta, Savaysa for 3-5 days  Anticoagulant: Coumadin, Eliquis,  Lovenox, Heparin, Pradaxa, Xarelto for 5-7 days.  All antiplatelets/anticoagulants must be held for 7 days for a spinal cord stimulator.                                Cosigned by: Brien Few, MD at 12/09/2013  8:17 AM   Revision History       Date/Time User Provider Type Action     12/09/2013  8:17 AM Brien Few, MD Physician Cosign     12/04/2013 12:47 PM [name], NP Nurse Practitioner Sign

## 2013-12-18 ENCOUNTER — Encounter

## 2013-12-18 NOTE — Telephone Encounter (Signed)
Faxed 12/04/13 medication hiatus letter to Dr Beau Fanny at [phone] on 12/09/13. Received confirmation of fax transmission at 14:10.

## 2014-04-30 ENCOUNTER — Encounter: Attending: Nurse Practitioner | Primary: Family Medicine

## 2014-05-02 ENCOUNTER — Ambulatory Visit: Admit: 2014-05-02 | Discharge: 2014-05-02 | Payer: MEDICARE | Attending: Family | Primary: Family Medicine

## 2014-05-02 DIAGNOSIS — N390 Urinary tract infection, site not specified: Secondary | ICD-10-CM

## 2014-05-02 LAB — MICROSCOPIC URINALYSIS
Epithelial Cells UA: 5 /HPF (ref 0–5)
RBC, UA: 5 /HPF (ref 0–4)
WBC, UA: >100 /HPF (ref 0–4)

## 2014-05-02 LAB — UA W/REFLEX CULTURE
Bilirubin Urine: NEGATIVE
Glucose, Ur: NEGATIVE
Ketones, Urine: NEGATIVE
Nitrite, Urine: NEGATIVE
Protein, UA: NEGATIVE
Specific Gravity, UA: 1.03 — ABNORMAL HIGH (ref 1.010–1.025)
Urobilinogen, Urine: NORMAL
pH, UA: 5 (ref 5.0–6.0)

## 2014-05-02 MED ORDER — LEVOFLOXACIN 500 MG PO TABS
500 MG | ORAL_TABLET | Freq: Every day | ORAL | Status: DC
Start: 2014-05-02 — End: 2014-05-09

## 2014-05-02 NOTE — Patient Instructions (Signed)
Urinary Tract Infection in Women: Care Instructions  Your Care Instructions     A urinary tract infection, or UTI, is a general term for an infection anywhere between the kidneys and the urethra (where urine comes out). Most UTIs are bladder infections. They often cause pain or burning when you urinate.  UTIs are caused by bacteria and can be cured with antibiotics. Be sure to complete your treatment so that the infection goes away.  Follow-up care is a key part of your treatment and safety. Be sure to make and go to all appointments, and call your doctor if you are having problems. It's also a good idea to know your test results and keep a list of the medicines you take.  How can you care for yourself at home?  ?? Take your antibiotics as directed. Do not stop taking them just because you feel better. You need to take the full course of antibiotics.  ?? Drink extra water and other fluids for the next day or two. This may help wash out the bacteria that are causing the infection. (If you have kidney, heart, or liver disease and have to limit fluids, talk with your doctor before you increase your fluid intake.)  ?? Avoid drinks that are carbonated or have caffeine. They can irritate the bladder.  ?? Urinate often. Try to empty your bladder each time.  ?? To relieve pain, take a hot bath or lay a heating pad set on low over your lower belly or genital area. Never go to sleep with a heating pad in place.  To prevent UTIs  ?? Drink plenty of water each day. This helps you urinate often, which clears bacteria from your system. (If you have kidney, heart, or liver disease and have to limit fluids, talk with your doctor before you increase your fluid intake.)  ?? Consider adding cranberry juice to your diet.  ?? Urinate when you need to.  ?? Urinate right after you have sex.  ?? Change sanitary pads often.  ?? Avoid douches, bubble baths, feminine hygiene sprays, and other feminine hygiene products that have deodorants.  ?? After  going to the bathroom, wipe from front to back.  When should you call for help?  Call your doctor now or seek immediate medical care if:  ?? Symptoms such as fever, chills, nausea, or vomiting get worse or appear for the first time.  ?? You have new pain in your back just below your rib cage. This is called flank pain.  ?? There is new blood or pus in your urine.  ?? You have any problems with your antibiotic medicine.  Watch closely for changes in your health, and be sure to contact your doctor if:  ?? You are not getting better after taking an antibiotic for 2 days.  ?? Your symptoms go away but then come back.   Where can you learn more?   Go to [email or web address] and sign in to your MyChart account. Enter [id number] in the Search Health Information box to learn more about ???Urinary Tract Infection in Women: Care Instructions.???    If you do not have an account, please click on the ???Sign Up Now??? link.     ?? 2006-2015 [employer]. Care instructions adapted under license by [facility]. This care instruction is for use with your licensed healthcare professional. If you have questions about a medical condition or this instruction, always ask your healthcare professional. [employer] disclaims any warranty or liability for your   use of this information.  Content Version: 10.6.465758; Current as of: September 18, 2012

## 2014-05-02 NOTE — Progress Notes (Addendum)
Subjective:      Patient ID: Katie Juarez is a 74 y.o. female.    Urinary Tract Infection   This is a new problem. The current episode started 1 to 4 weeks ago. The problem occurs every urination. The quality of the pain is described as burning. The pain is at a severity of 6/10. She is sexually active. Associated symptoms include frequency and urgency. Pertinent negatives include no nausea. Treatments tried: Keflex. The treatment provided no relief.       Review of Systems   Constitutional: Negative.    Respiratory: Negative.    Cardiovascular: Negative.    Gastrointestinal: Negative for nausea.   Genitourinary: Positive for dysuria, urgency and frequency.   Musculoskeletal: Negative.    Skin: Negative.    Neurological: Negative.        Objective:   Physical Exam   Constitutional: She is oriented to person, place, and time. She appears well-developed.   HENT:   Head: Normocephalic and atraumatic.   Right Ear: Tympanic membrane, external ear and ear canal normal.   Left Ear: Tympanic membrane, external ear and ear canal normal.   Nose: Nose normal.   Mouth/Throat: Uvula is midline, oropharynx is clear and moist and mucous membranes are normal.   Eyes: Conjunctivae, EOM and lids are normal. Pupils are equal, round, and reactive to light.   Neck: Trachea normal and normal range of motion.   Cardiovascular: Normal rate, regular rhythm, normal heart sounds and normal pulses.    Pulmonary/Chest: Effort normal and breath sounds normal.   Musculoskeletal: Normal range of motion.   Neurological: She is alert and oriented to person, place, and time.   Skin: Skin is warm, dry and intact.     Filed Vitals:    05/02/14 1746   BP: 140/78   Pulse: 60   Temp: 97.9 ??F (36.6 ??C)   Resp: 16   SpO2: 97%     Results for orders placed or performed in visit on 05/02/14   UA W/Reflex Culture   Result Value Ref Range    Color, UA NOT REPORTED YEL    Turbidity UA NOT REPORTED CLEAR    Glucose, Ur NEGATIVE NEG    Bilirubin Urine NEGATIVE  NEG    Ketones, Urine NEGATIVE NEG    Specific Gravity, UA 1.030 (H) 1.010 - 1.025    Urine Hgb 2+ (A) NEG    pH, UA 5.0 5.0 - 6.0    Protein, UA NEGATIVE NEG    Urobilinogen, Urine Normal NORM    Nitrite, Urine NEGATIVE NEG    Leukocyte Esterase, Urine 3+ (A) NEG    Urinalysis Comments NOT REPORTED      U/A:    Lab Results   Component Value Date    COLORU NOT REPORTED 05/05/2014    PHUR 5.0 05/05/2014    WBCUA 10 TO 25 05/05/2014    RBCUA 5 TO 10 05/05/2014    MUCUS NOT REPORTED 05/05/2014    TRICHOMONAS NOT REPORTED 05/05/2014    YEAST NOT REPORTED 05/05/2014    BACTERIA 2+ 05/05/2014    SPECGRAV 1.025 05/05/2014    LEUKOCYTESUR 2+ 05/05/2014    UROBILINOGEN 4 mg/dL 05/05/2014    BILIRUBINUR NEGATIVE 05/05/2014    GLUCOSEU TRACE 05/05/2014    AMORPHOUS NOT REPORTED 05/05/2014         Assessment:      1. Urinary tract infection, site unspecified  levofloxacin (LEVAQUIN) 500 MG tablet   2. Dysuria  UA W/Reflex  Culture    levofloxacin (LEVAQUIN) 500 MG tablet     05/05/14: Patient was switched to Bactrim due to worsening symptoms from 05/02/14.         Plan:      Patient advised to drink plenty of fluids and take medication as prescribed. Advised to follow up with family doctor or return to urgent care/ emergency room if symptoms worsen with high fever, chills, vomiting, increased back or flank pain, abdominal pain or any new symptoms develops.  Stop taking Celexa while taking Levaquin.  Patient states she is usually treated with Levaquin as Cipro and Bactrim do not work for her.    05/05/14: Switch from Levaquin to Bactrim.  Start Pyridium.  Will call when Urine culture results made available if Bactrim not going to be effective.    Allergies   Allergen Reactions   ??? Pcn [Penicillins]    ??? Zithromax [Azithromycin]      Current Outpatient Prescriptions   Medication Sig Dispense Refill   ??? levofloxacin (LEVAQUIN) 500 MG tablet Take 1 tablet by mouth daily for 7 days Take with food. 7 tablet 0   ??? ibuprofen (ADVIL;MOTRIN)  600 MG tablet Take 1 tablet by mouth every 8 hours as needed for Pain 120 tablet 3   ??? atorvastatin (LIPITOR) 20 MG tablet      ??? NEXIUM 40 MG capsule      ??? RA LORATADINE 10 MG tablet   0   ??? PREVNAR 13 SUSP inj   0   ??? Multiple Vitamins-Iron (MULTI-VITAMIN/IRON) TABS Take  by mouth Daily.     ??? CALCIUM CARBONATE-VIT D-MIN PO Take  by mouth 2 times daily.     ??? aspirin 81 MG tablet Take 81 mg by mouth daily.     ??? citalopram (CELEXA) 10 MG tablet Take 10 mg by mouth. 1/2 tablet by mouth at bedtime     ??? LORazepam (ATIVAN) 0.5 MG tablet Take 0.5 mg by mouth. 1 tablet by mouth at bedtime PRN     ??? budesonide-formoterol (SYMBICORT) 160-4.5 MCG/ACT AERO Inhale 1 puff into the lungs 2 times daily.     ??? polyvinyl alcohol-povidone (HYPOTEARS) 1.4-0.6 % ophthalmic solution Place 1-2 drops into both eyes as needed.     ??? nitrofurantoin, macrocrystal-monohydrate, (MACROBID) 100 MG capsule Take 100 mg by mouth 2 times daily.     ??? sulfamethoxazole-trimethoprim (BACTRIM DS) 800-160 MG per tablet Take 1 tablet by mouth 2 times daily for 7 days 14 tablet 0   ??? phenazopyridine (PYRIDIUM) 200 MG tablet Take 1 tablet by mouth 3 times daily as needed for Pain 12 tablet 0   ??? vitamin B-12 (CYANOCOBALAMIN) 1000 MCG tablet Take 1,000 mcg by mouth daily     ??? FLUZONE HIGH-DOSE 0.5 ML SUSY injection   0   ??? bisacodyl (DULCOLAX) 5 MG EC tablet Take 5 mg by mouth daily as needed.       No current facility-administered medications for this visit.

## 2014-05-05 ENCOUNTER — Encounter: Admit: 2014-05-05 | Discharge: 2014-05-05 | Payer: MEDICARE | Primary: Family Medicine

## 2014-05-05 ENCOUNTER — Encounter

## 2014-05-05 ENCOUNTER — Telehealth: Payer: MEDICARE

## 2014-05-05 LAB — UA W/REFLEX CULTURE
Bilirubin Urine: NEGATIVE
Ketones, Urine: NEGATIVE
Nitrite, Urine: POSITIVE — AB
Specific Gravity, UA: 1.025 (ref 1.010–1.025)
Urobilinogen, Urine: 4 — AB
pH, UA: 5 (ref 5.0–6.0)

## 2014-05-05 LAB — MICROSCOPIC URINALYSIS
Epithelial Cells UA: 0 /HPF (ref 0–5)
RBC, UA: 5 /HPF (ref 0–4)
WBC, UA: 10 /HPF (ref 0–4)

## 2014-05-05 MED ORDER — SULFAMETHOXAZOLE-TRIMETHOPRIM 800-160 MG PO TABS
800-160 MG | ORAL_TABLET | Freq: Two times a day (BID) | ORAL | Status: AC
Start: 2014-05-05 — End: 2014-05-12

## 2014-05-05 MED ORDER — PHENAZOPYRIDINE HCL 200 MG PO TABS
200 MG | ORAL_TABLET | Freq: Three times a day (TID) | ORAL | Status: AC | PRN
Start: 2014-05-05 — End: 2014-05-08

## 2014-05-05 NOTE — Telephone Encounter (Signed)
Patient called stating she is not feeling better, still having urinary burning and frequency.  Spoke with Lanice Schwab and verbal order for UA and culture placed in computer.  Patient will stop in today to have done.

## 2014-05-05 NOTE — Telephone Encounter (Signed)
UA done today and notes placed under lab result for change in medication.

## 2014-05-08 LAB — CULTURE, URINE

## 2014-05-09 ENCOUNTER — Ambulatory Visit: Admit: 2014-05-09 | Discharge: 2014-05-09 | Payer: MEDICARE | Attending: Nurse Practitioner | Primary: Family Medicine

## 2014-05-09 DIAGNOSIS — M533 Sacrococcygeal disorders, not elsewhere classified: Secondary | ICD-10-CM

## 2014-05-09 NOTE — Telephone Encounter (Signed)
Faxed medication hiatus letter to Dr Beau Fanny at [phone] on 05/09/14. Received confirmation of fax transmission at [phone].

## 2014-05-09 NOTE — Progress Notes (Signed)
Subjective:      Patient ID: Katie Juarez is a 74 y.o. female.    HPI Underwent IESI's previously in 11/2013 and 12/2013, no longer effective. Low back pain, pain in buttocks as well as posterior thigh. Worse with sitting.    Allergies   Allergen Reactions   ??? Pcn [Penicillins]    ??? Zithromax [Azithromycin]        Outpatient Prescriptions Marked as Taking for the 05/09/14 encounter (Office Visit) with [name], NP   Medication Sig Dispense Refill   ??? sulfamethoxazole-trimethoprim (BACTRIM DS) 800-160 MG per tablet Take 1 tablet by mouth 2 times daily for 7 days 14 tablet 0   ??? vitamin B-12 (CYANOCOBALAMIN) 1000 MCG tablet Take 1,000 mcg by mouth daily     ??? ibuprofen (ADVIL;MOTRIN) 600 MG tablet Take 1 tablet by mouth every 8 hours as needed for Pain 120 tablet 3   ??? atorvastatin (LIPITOR) 20 MG tablet Take 20 mg by mouth daily      ??? NEXIUM 40 MG capsule Take 40 mg by mouth every morning (before breakfast)      ??? RA LORATADINE 10 MG tablet Take 10 mg by mouth daily   0   ??? Multiple Vitamins-Iron (MULTI-VITAMIN/IRON) TABS Take  by mouth Daily.     ??? CALCIUM CARBONATE-VIT D-MIN PO Take  by mouth 2 times daily.     ??? aspirin 81 MG tablet Take 81 mg by mouth daily.     ??? citalopram (CELEXA) 10 MG tablet Take 10 mg by mouth. 1/2 tablet by mouth at bedtime     ??? LORazepam (ATIVAN) 0.5 MG tablet Take 0.5 mg by mouth. 1 tablet by mouth at bedtime PRN     ??? budesonide-formoterol (SYMBICORT) 160-4.5 MCG/ACT AERO Inhale 1 puff into the lungs 2 times daily.     ??? polyvinyl alcohol-povidone (HYPOTEARS) 1.4-0.6 % ophthalmic solution Place 1-2 drops into both eyes as needed.     ??? nitrofurantoin, macrocrystal-monohydrate, (MACROBID) 100 MG capsule Take 100 mg by mouth 2 times daily.         Past Medical History   Diagnosis Date   ??? Breast cancer ([place])    ??? SS (spinal stenosis)    ??? DDD (degenerative disc disease)    ??? LBP (low back pain)    ??? 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 Hysterectomy     ??? Bladder suspension     ??? Cholecystectomy     ??? Breast lumpectomy     ??? Other surgical history  11/14/13, 06/23/11, 06/30/11, 07/12/11 12/29/11     L4/L5 IESI   ??? Other surgical history  12/17/13     L5/S1 IESI       Review of Systems   Constitutional: Positive for fatigue.   Respiratory: Negative for shortness of breath.    Cardiovascular: Positive for leg swelling. Negative for chest pain.   Gastrointestinal: Positive for constipation.   Genitourinary: Negative for difficulty urinating.   Musculoskeletal: Positive for myalgias, back pain, joint swelling, arthralgias and neck pain.   Neurological: Positive for weakness, numbness and headaches.   Psychiatric/Behavioral: Positive for sleep disturbance.       Objective:   Physical Exam   Constitutional: She is oriented to person, place, and time. Vital signs are normal. She appears well-developed and well-nourished. She is cooperative. No distress.   HENT:  Head: Normocephalic and atraumatic.   Cardiovascular: Normal rate and intact distal pulses.    Pulmonary/Chest: Effort normal. No respiratory distress.   Musculoskeletal:        Lumbar back: She exhibits decreased range of motion (flexion induces pain), tenderness, bony tenderness (bilateral PSIS) and pain.        Back:    TYPE OF EXAM: MRI lumbar spine without contrast  DATE OF EXAM: 12/13/13  COMPARISON: 12/21/10  CLINICAL INDICATION: Degeneration of intervertebral disc of the lumbar region, lumbar spinal stenosis  FINDINGS: 2 mm retrolisthesis of L5 on S1, 2 mm anterolisthesis of L4 on L5, and 2 mm retrolisthesis of L3 on L4, no pars interarticularis defect evident. Conus at the level of LI and appears unremarkable. Mild degenerative disease within the lumbar   spine. Cystic structures posterior to L4/5 facet joints and adjacent to the spinous process of L5 likely related to synovial cysts, largest is on the right measuring 1.6 cm. Fluid within L4/5 facet  joints may be on a degenerative or inflammatory basis. 1  cm cyst lower pole left kidney unchanged.   L5/S1 disc bulge asymmetric to the left. Facet hypertrophic degenerative changes and ligamentum flavum hypertrophy are present. No central canal stenosis. Mild narrowing right neural foramen. Severe narrowing left neural foramen, facet hypertrophic   degenerative changes and disc contact exiting nerve root within the left neural foramen and disc contacts exiting nerve root just lateral to the left neural foramen.  L4/5 disc bulge along with facet hypertrophic degenerative changes. Severe central canal stenosis anterior posterior dimension of the thecal sac measures 6 mm. Mild narrowing bilateral neural foramina.  L3/4 left neural foraminal/lateral disc protrusion superimposed on disc bulge. Facet hypertrophic degenerative changes and ligamentum flavum hypertrophy present. No central canal stenosis. Mild narrowing right neural foramen. Moderate narrowing left   neural foramen, disc contacts exiting nerve root in the distal left neural foramen and just lateral to the left neural foramen.  L2/3 mild disc bulge slightly asymmetric to the left. Facet hypertrophic degenerative changes and ligamentum flavum hypertrophy present. No central canal stenosis. Right neural foramen patent. Mild narrowing left neural foramen.  L1/2 no disc herniation, central canal stenosis, or neural foramina compromise. Mild facet hypertrophic degenerative changes are present.  IMPRESSION: 1. Degenerative changes result in severe central canal stenosis at L4/5 along with varying degrees of neural foraminal narrowing within the lumbar spine. See above for description of findings at each level. Overall, findings are similar when compared to   prior exam.           Neurological: She is alert and oriented to person, place, and time. She is not disoriented. No cranial nerve deficit.   Negative SLR   Skin: Skin is warm, dry and intact. She is not  diaphoretic. No cyanosis. No pallor. Nails show no clubbing.   Psychiatric: She has a normal mood and affect. Her speech is normal.   Vitals reviewed.      Assessment:      1. Pain of both sacroiliac joints    2. Piriformis syndrome of left side    3. Piriformis syndrome, right    4. Lumbar radicular pain    5. Neural foraminal stenosis of lumbar spine    6. Chronic low back pain    7. Spinal stenosis, lumbar           Plan:    Schedule bilateral SIJ/piriformis injection. Two weeks later bilateral L5 TFE     Informed consent  has been obtained for procedure. Roselyn Reef Dishongis  on blood thinners. Medications to hold have been reviewed with patient.Blood thinners must be held with permission from your cardiologist or primary care physician. A letter is required to besent to PCP/Cardiologist regarding holding medications for procedure to decrease bleeding risk. Blood Thinners (antiplatelet/) medications that must be held prior to spinal cord stimulator trials, and implants, interlaminar epidurals, caudal epidurals, or transforaminal epidural injections:    Antiplatelets Aspirin, Effient, Plavix,Ticlid,  Brilinta, Savaysa for 3-5 days  Anticoagulant: Coumadin, Eliquis, Lovenox, Heparin, Pradaxa, Xarelto for 5-7 days.  All antiplatelets/anticoagulants must be held for 7 days for a spinal cord stimulator.

## 2014-05-09 NOTE — Patient Instructions (Signed)
[facility]  [address], [place], [place] [address]  Telephone [phone]  Fax [phone]      PROCEDURE INSTRUCTIONS FOR  PAIN MANAGEMENT PROCEDURES WITHOUT IV SEDATION    Katie Juarez scheduled to see Dr. Marcello Moores to undergo the following procedure: Bilateral SIJ and piriformis Inj    Procedure Date: ____05/20/2016____  Arrival Time: ____08:45AM____     Report to the [place], Registration office on the 1st floor in the hospital, after check in and signing of paper work you will then go to the second floor to the surgery center.    1. Stop the following medications prior to the procedure: NONE    2.  Take all routine medications unless otherwise instructed. Ok to take vitamins and antiinflammatory medications    3.  EATING & DRINKING:  Ok to eat or drink before the injection - no IV sedation will be used.     4. If you are allergic to contrast or iodine, you must take benadryl and prednisone prior to the injection to prevent an allergic reaction. Follow the directions on the prescription for the times to take the medication.    5. Oral valium can be prescribed if your are anxious about the injections or feel that you can not lay still during the injection. If you take valium, you must have a driver. Make arrangements for a family member or friend to drive you to the surgery center.  Your ride must stay in the hospital while you are having the injection done. If they cannot stay, the injection will be rescheduled. The valium may affect your judgment following the procedure and driving a vehicle within 24 hours after the sedation could be dangerous.    6.  Wear simple loose clothing, which can be easily changed.      7.  Leave jewelry (including rings) and other valuables at home.     8.  You will be asked to sign several forms prior to surgery; patients under the age of 7 must have a parent or legal guardian sign the permit to be able to do the procedure.      9.  You must  have finished any antibiotic prescribed for recent infections. If required, please take pre-procedure antibiotic or other pre-procedure medications as instructed.      10. Bring inhalers and pain medications with you to your procedure.      11. Bring your MRI/CT films if they were done outside of the [facility].    12. If you should develop a cold, sore throat, cough, fever or other new indication of illness or infection, or are started on antibiotics within 2 weeks of the scheduled procedure, please notify the Doctor???s office as early as possible at [phone].                    [facility]  [address] [place], [place] [address]  Telephone [phone]  Fax [phone]      PROCEDURE INSTRUCTIONS FOR  PAIN MANAGEMENT PROCEDURES WITHOUT IV SEDATION    Katie Juarez scheduled to see Dr. Marcello Moores to undergo the following procedure: Bilateral L5 TFE    Procedure Date: ____06/03/2016____  Arrival Time: ____08:45AM____     Report to the [place], Registration office on the 1st floor in the hospital, after check in and signing of paper work you will then go to the second floor to the surgery center.  1. Stop the following medications prior to the procedure:  Date__5/27/16__ : Stop blood thinners as directed before the injection, with permission from your cardiologist or primary care physician. We will send a letter to them requesting permission to hold the blood thinners.     ?? Medication___Aspirin___ held for __7__ days    If you take Warfarin (Coumadin), you must have your blood drawn for an INR the day before the procedure. INR must be less than 1.5.    2.  Take all routine medications unless otherwise instructed. Ok to take vitamins and antiinflammatory medications    3.  EATING & DRINKING:  Ok to eat or drink before the injection - no IV sedation will be used.     4. If you are allergic to contrast or iodine, you must take benadryl and prednisone prior to the  injection to prevent an allergic reaction. Follow the directions on the prescription for the times to take the medication.    5. Oral valium can be prescribed if your are anxious about the injections or feel that you can not lay still during the injection. If you take valium, you must have a driver. Make arrangements for a family member or friend to drive you to the surgery center.  Your ride must stay in the hospital while you are having the injection done. If they cannot stay, the injection will be rescheduled. The valium may affect your judgment following the procedure and driving a vehicle within 24 hours after the sedation could be dangerous.    6.  Wear simple loose clothing, which can be easily changed.      7.  Leave jewelry (including rings) and other valuables at home.     8.  You will be asked to sign several forms prior to surgery; patients under the age of 62 must have a parent or legal guardian sign the permit to be able to do the procedure.      9.  You must have finished any antibiotic prescribed for recent infections. If required, please take pre-procedure antibiotic or other pre-procedure medications as instructed.      10. Bring inhalers and pain medications with you to your procedure.      11. Bring your MRI/CT films if they were done outside of the [facility].    12. If you should develop a cold, sore throat, cough, fever or other new indication of illness or infection, or are started on antibiotics within 2 weeks of the scheduled procedure, please notify the Doctor???s office as early as possible at [phone].

## 2014-05-30 ENCOUNTER — Encounter: Admit: 2014-05-30 | Payer: MEDICARE | Primary: Family Medicine

## 2014-05-30 ENCOUNTER — Inpatient Hospital Stay: Payer: MEDICARE | Attending: Physical Medicine & Rehabilitation | Primary: Family Medicine

## 2014-05-30 ENCOUNTER — Encounter: Attending: Physical Medicine & Rehabilitation | Primary: Family Medicine

## 2014-05-30 DIAGNOSIS — M533 Sacrococcygeal disorders, not elsewhere classified: Secondary | ICD-10-CM

## 2014-05-30 MED ORDER — ROPIVACAINE HCL 5 MG/ML IJ SOLN
INTRAMUSCULAR | Status: AC
Start: 2014-05-30 — End: ?

## 2014-05-30 MED ORDER — DEXAMETHASONE SOD PHOSPHATE PF 10 MG/ML IJ SOLN
10 MG/ML | INTRAMUSCULAR | Status: AC
Start: 2014-05-30 — End: ?

## 2014-05-30 MED ORDER — BUFFERED LIDOCAINE 1%
Status: AC
Start: 2014-05-30 — End: ?

## 2014-05-30 MED ORDER — LIDOCAINE HCL (PF) 2 % IJ SOLN
2 % | INTRAMUSCULAR | Status: AC
Start: 2014-05-30 — End: ?

## 2014-05-30 MED FILL — BUFFERED LIDOCAINE 1%: Qty: 20

## 2014-05-30 MED FILL — NAROPIN 5 MG/ML IJ SOLN: 5 MG/ML | INTRAMUSCULAR | Qty: 20

## 2014-05-30 MED FILL — DEXAMETHASONE SOD PHOSPHATE PF 10 MG/ML IJ SOLN: 10 MG/ML | INTRAMUSCULAR | Qty: 2

## 2014-05-30 MED FILL — LIDOCAINE HCL (PF) 2 % IJ SOLN: 2 % | INTRAMUSCULAR | Qty: 10

## 2014-05-30 NOTE — H&P (Signed)
[name], NP Nurse Practitioner Signed  Progress Notes 05/09/2014 11:27 AM   Related encounter: Office Visit from 05/09/2014 in [facility]      Expand All Collapse All   Subjective:        Patient ID: Katie Juarez is a 74 y.o. female.    HPI Underwent IESI's previously in 11/2013 and 12/2013, no longer effective. Low back pain, pain in buttocks as well as posterior thigh. Worse with sitting.      Allergies    Allergen  Reactions    ???  Pcn [Penicillins]      ???  Zithromax [Azithromycin]             Active Medications    Outpatient Prescriptions Marked as Taking for the 05/09/14 encounter (Office Visit) with [name], NP    Medication  Sig  Dispense  Refill    ???  sulfamethoxazole-trimethoprim (BACTRIM DS) 800-160 MG per tablet  Take 1 tablet by mouth 2 times daily for 7 days  14 tablet  0    ???  vitamin B-12 (CYANOCOBALAMIN) 1000 MCG tablet  Take 1,000 mcg by mouth daily        ???  ibuprofen (ADVIL;MOTRIN) 600 MG tablet  Take 1 tablet by mouth every 8 hours as needed for Pain  120 tablet  3    ???  atorvastatin (LIPITOR) 20 MG tablet  Take 20 mg by mouth daily         ???  NEXIUM 40 MG capsule  Take 40 mg by mouth every morning (before breakfast)         ???  RA LORATADINE 10 MG tablet  Take 10 mg by mouth daily     0    ???  Multiple Vitamins-Iron (MULTI-VITAMIN/IRON) TABS  Take  by mouth Daily.        ???  CALCIUM CARBONATE-VIT D-MIN PO  Take  by mouth 2 times daily.        ???  aspirin 81 MG tablet  Take 81 mg by mouth daily.        ???  citalopram (CELEXA) 10 MG tablet  Take 10 mg by mouth. 1/2 tablet by mouth at bedtime        ???  LORazepam (ATIVAN) 0.5 MG tablet  Take 0.5 mg by mouth. 1 tablet by mouth at bedtime PRN        ???  budesonide-formoterol (SYMBICORT) 160-4.5 MCG/ACT AERO  Inhale 1 puff into the lungs 2 times daily.        ???  polyvinyl alcohol-povidone (HYPOTEARS) 1.4-0.6 % ophthalmic solution  Place 1-2 drops into both eyes as needed.        ???  nitrofurantoin, macrocrystal-monohydrate, (MACROBID)  100 MG capsule  Take 100 mg by mouth 2 times daily.                  Past Medical History    Past Medical History    Diagnosis  Date    ???  Breast cancer ([place])      ???  SS (spinal stenosis)      ???  DDD (degenerative disc disease)      ???  LBP (low back pain)      ???  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  Hysterectomy        ???  Bladder suspension        ???  Cholecystectomy        ???  Breast lumpectomy        ???  Other surgical history    11/14/13, 06/23/11, 06/30/11, 07/12/11 12/29/11        L4/L5 IESI    ???  Other surgical history    12/17/13        L5/S1 IESI           Review of Systems   Constitutional: Positive for fatigue.   Respiratory: Negative for shortness of breath.    Cardiovascular: Positive for leg swelling. Negative for chest pain.   Gastrointestinal: Positive for constipation.   Genitourinary: Negative for difficulty urinating.   Musculoskeletal: Positive for myalgias, back pain, joint swelling, arthralgias and neck pain.   Neurological: Positive for weakness, numbness and headaches.   Psychiatric/Behavioral: Positive for sleep disturbance.        Objective:    Physical Exam   Constitutional: She is oriented to person, place, and time. Vital signs are normal. She appears well-developed and well-nourished. She is cooperative. No distress.   HENT:    Head: Normocephalic and atraumatic.   Cardiovascular: Normal rate and intact distal pulses.    Pulmonary/Chest: Effort normal. No respiratory distress.   Musculoskeletal:        Lumbar back: She exhibits decreased range of motion (flexion induces pain), tenderness, bony tenderness (bilateral PSIS) and pain.       Back:    TYPE OF EXAM: MRI lumbar spine without contrast  DATE OF EXAM: 12/13/13  COMPARISON: 12/21/10  CLINICAL INDICATION: Degeneration of intervertebral disc of the lumbar region, lumbar spinal stenosis  FINDINGS: 2 mm retrolisthesis of L5 on S1,  2 mm anterolisthesis of L4 on L5, and 2 mm retrolisthesis of L3 on L4, no pars interarticularis defect evident. Conus at the level of LI and appears unremarkable. Mild degenerative disease within the lumbar   spine. Cystic structures posterior to L4/5 facet joints and adjacent to the spinous process of L5 likely related to synovial cysts, largest is on the right measuring 1.6 cm. Fluid within L4/5 facet joints may be on a degenerative or inflammatory basis. 1  cm cyst lower pole left kidney unchanged.   L5/S1 disc bulge asymmetric to the left. Facet hypertrophic degenerative changes and ligamentum flavum hypertrophy are present. No central canal stenosis. Mild narrowing right neural foramen. Severe narrowing left neural foramen, facet hypertrophic   degenerative changes and disc contact exiting nerve root within the left neural foramen and disc contacts exiting nerve root just lateral to the left neural foramen.  L4/5 disc bulge along with facet hypertrophic degenerative changes. Severe central canal stenosis anterior posterior dimension of the thecal sac measures 6 mm. Mild narrowing bilateral neural foramina.  L3/4 left neural foraminal/lateral disc protrusion superimposed on disc bulge. Facet hypertrophic degenerative changes and ligamentum flavum hypertrophy present. No central canal stenosis. Mild narrowing right neural foramen. Moderate narrowing left   neural foramen, disc contacts exiting nerve root in the distal left neural foramen and just lateral to the left neural foramen.  L2/3 mild disc bulge slightly asymmetric to the left. Facet hypertrophic degenerative changes and ligamentum flavum hypertrophy present. No central canal stenosis. Right neural foramen patent. Mild narrowing left neural foramen.  L1/2 no disc herniation, central canal stenosis, or neural foramina compromise. Mild facet hypertrophic degenerative changes are present.  IMPRESSION: 1. Degenerative changes  result in severe central canal  stenosis at L4/5 along with varying degrees of neural foraminal narrowing within the lumbar spine. See above for description of findings at each level. Overall, findings are similar when compared to   prior exam.          Neurological: She is alert and oriented to person, place, and time. She is not disoriented. No cranial nerve deficit.   Negative SLR   Skin: Skin is warm, dry and intact. She is not diaphoretic. No cyanosis. No pallor. Nails show no clubbing.   Psychiatric: She has a normal mood and affect. Her speech is normal.   Vitals reviewed.        Assessment:       1.  Pain of both sacroiliac joints     2.  Piriformis syndrome of left side     3.  Piriformis syndrome, right     4.  Lumbar radicular pain     5.  Neural foraminal stenosis of lumbar spine     6.  Chronic low back pain     7.  Spinal stenosis, lumbar                      Plan:     Schedule bilateral SIJ/piriformis injection. Two weeks later bilateral L5 TFE     Informed consent has been obtained for procedure. Roselyn Reef Dishongis  on blood thinners. Medications to hold have been reviewed with patient.Blood thinners must be held with permission from your cardiologist or primary care physician. A letter is required to besent to PCP/Cardiologist regarding holding medications for procedure to decrease bleeding risk. Blood Thinners (antiplatelet/) medications that must be held prior to spinal cord stimulator trials, and implants, interlaminar epidurals, caudal epidurals, or transforaminal epidural injections:     Antiplatelets Aspirin, Effient, Plavix,Ticlid,  Brilinta, Savaysa for 3-5 days  Anticoagulant: Coumadin, Eliquis, Lovenox, Heparin, Pradaxa, Xarelto for 5-7 days.  All antiplatelets/anticoagulants must be held for 7 days for a spinal cord stimulator.

## 2014-05-30 NOTE — Op Note (Signed)
[place] JOINT INJECTION    05/30/14    Surgeon: Willa Rough, MD    Pre-operative Diagnosis:   Patient Active Problem List   Diagnosis Code   ??? SS (spinal stenosis) M48.00   ??? 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 Low back pain M54.5   ??? 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 Lumbosacral stenosis M48.07   ??? 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 Pain of both sacroiliac joints M53.3   ??? Piriformis syndrome of left side G57.02   ??? Piriformis syndrome G57.00   ??? Lumbar radicular pain M54.16   ??? 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 Chronic low back pain M54.5, G89.29   ??? Spinal stenosis, lumbar M48.06       Post-operative Diagnosis: Same    Assistants: none    INDICATION::Please see H&P for details on previous treatments, examination findings, and work up.  Bilateral  sacroiliac joint injection with arthrography is requested for diagnostic reasons.    Conservative treatment was ineffective i.e.: ice, NSAIDS, rest, narcotic medication, chiropractic care, physical therapy and message therapy.    Patient is unable to perform the following ADL's: ambulating and grooming                         Last Plain films:lumbar MRI    EXAMINATION:??Bilateral sacroiliac joint injection with arthrography.    CONSENT:?? Written consent was obtained from the patient on preprinted consent form after explaining the procedure, indications, potential complications and outcomes. Alternative treatments were also discussed.    DISCUSSION:?? The patient was sterilely prepped and draped in the usual fashion in the prone position.?? ???Time out??? was verified for correct patient, side, level and procedure.    SEDATION:   No conscious sedation was performed during the procedure.?? The patient remained awake and conversed throughout the procedure.?? The patient underwent pulse oximetry and blood pressure monitoring independently by a trained observer, as well as by a  physician.    PROCEDURES #1 to #3:  Under image-intensifier control, a standard technique was employed, a 22 gauge needle x 3.5 inch spinal needle was guided successfully to cannulate the Bilateral sacroiliac joint via a posterior lateral approach.    Instillation of .5 mL of Omnipaque 240 contrast medium demonstrated contiguous flow into the joint and the joint capsule. No vascular spread was noted. Digital subtraction was not employed to evaluate for vascular spread. The patient was monitored for any untoward reaction to contrast medium before proceeding with procedure #2. Then 3.0 mL of equal volumes of 0.50% ropivacaine, 2% lidocaine and betamethasone (Celestone 6 mg/mL), dexamethasone (Decadron 10 mg/mL), methylprednisolone (Depo-Medrol 80 mg/mL was injected into each joint.      PIRIFORMIS MUSCLE TRIGGER POINT INJECTION:??  Using a 22 gauge needle x 3.5 inch spinal needle and fluoroscopic imaging the Bilateral piriformis muscle was accessed just inferior and lateral to the inferior margin of the Bilateral sacroiliac joint.?? Then, 0.3 mL of Omnipaque 240 confirmed avascular injection into the muscle, and 3 mL of 2% lidocaine was injected into the muscle and the needle removed.    PROVOCATION: The patient did not report pain reproduction in a concordant distribution upon capsular distention of the Bilateral sacroiliac joints.    ARTHROGRAPHY: The Bilateral sacroiliac arthrogram revealed contrast in the joint space and inferior recesses; no contrast extravasation.    EBL: no blood loss    SPECIMEN: none  The patient tolerated the procedure well and without complications and was noted to be in stable condition prior to discharge from the procedure center with discharge instructions.    IMPRESSIONS:  1. Bilateral  sacroiliac arthrogram is abnormal: extrasvasation  Right.  2. Bilateral  sacroiliac  joint injection  negative for provocation of the patient's pain symptoms.  3. Bilateral piriformis muscle  injection    RECOMMENDATIONS:  1. Complete and return ???Post-Procedure Pain and Activity Diary.???  2. Contact the [facility] for symptom exacerbation, fever or unusual symptoms.   3. Post-procedure care according to verbal and written discharge instructions    PELVIC FLUOROSCOPIC IMAGE INTERPRETATION    EXAMINATION:?? AP and lateral views.    FLUORO TIME: 21 seconds    DISCUSSION:?? Spot views of the spine reveal normal alignment and segmentation. Spinal needles are positioned in the Bilateral sacroiliac joint. Contrast outlines the joint space and reveals excellent contrast flow.Spinal needles are placed just anterior to the sacrum for the piriformis muscle injection. Contrast pattern is consistent with contrast in the muscle.  Visualized spine reveals Degenerative Joint Disease- S-I  pyriformis.Soft tissues reveal no abnormalities.    IMPRESSION:?? Bilateral sacroiliac joint arthrogram ??with satiafactory needle placement and contrast dispersal.     Electronically signed by Willa Rough, MD on 05/30/2014 at 8:06 AM

## 2014-05-30 NOTE — Interval H&P Note (Signed)
I have interviewed and examined the patient and reviewed the recent History and Physical.  There have been no changes to the recent H&P documentation. The surgical consent form has been signed.    Last anticoagulant medication use was:    Premedication taken for contrast allergy?No    Valium taken for oral sedation? No    No outpatient prescriptions have been marked as taking for the 05/30/14 encounter [facility] Encounter) with Willa Rough, MD.       The patient understands the planned operation and its associated risks and benefits and agrees to proceed.        Electronically signed by Willa Rough, MD on 05/30/2014 at 8:04 AM

## 2014-05-30 NOTE — Other (Signed)
Patient Acct Nbr:  [id number]  Primary AUTH/CERT:  [place] Name:   [place] Name:    Primary Insurance Group Number:    Primary Insurance Plan Type: [facility]  Primary Insurance Policy Number:  [id number]    Secondary AUTH/CERT:    [place] Name:   [facility]  Secondary Insurance Plan Name:  [place]  Secondary Insurance Group Number:  [id number]  Secondary Insurance Plan Type: [employer]  Secondary Insurance Policy Number:  [id number]    Tertiary AUTH/CERT:    [place] Name:   [facility] Insurance Plan Name:  [facility] Insurance Group Number:    [facility] Insurance Plan Type: [employer] Policy Number:  [id number]

## 2014-06-02 NOTE — Telephone Encounter (Signed)
Patient called to inform our office that after her procedure on Friday 05/30/14 she became very red all over her body and itchy.  The patient stated that after she took 2 benadryl and the redness and itching went away.  Patient stated that she has never had a reaction to these in the past.  The patient had one back in December 2015 and the patient has only ever had decadron and the one she had recently shows that she had methylprednisolone, and she had a reaction after the previous one.  Surgery center aware per phone to only use decadron for upcoming procedures,and depomedrol added to patient's allergies.

## 2014-06-02 NOTE — Progress Notes (Signed)
Patient thought she may have had a reaction to the injection due to feeling hot and turning red. Pt notified the office this morning.

## 2014-06-13 ENCOUNTER — Inpatient Hospital Stay: Payer: MEDICARE | Attending: Physical Medicine & Rehabilitation | Primary: Family Medicine

## 2014-06-13 ENCOUNTER — Encounter: Admit: 2014-06-13 | Payer: MEDICARE | Primary: Family Medicine

## 2014-06-13 ENCOUNTER — Encounter: Admit: 2014-06-13 | Attending: Physical Medicine & Rehabilitation | Primary: Family Medicine

## 2014-06-13 DIAGNOSIS — M5416 Radiculopathy, lumbar region: Secondary | ICD-10-CM

## 2014-06-13 MED ORDER — BUPIVACAINE HCL (PF) 0.5 % IJ SOLN
0.5 % | INTRAMUSCULAR | Status: AC
Start: 2014-06-13 — End: ?

## 2014-06-13 MED ORDER — BUFFERED LIDOCAINE 1%
Status: AC
Start: 2014-06-13 — End: ?

## 2014-06-13 MED ORDER — DEXAMETHASONE SOD PHOSPHATE PF 10 MG/ML IJ SOLN
10 MG/ML | INTRAMUSCULAR | Status: AC
Start: 2014-06-13 — End: ?

## 2014-06-13 MED FILL — BUPIVACAINE HCL (PF) 0.5 % IJ SOLN: 0.5 % | INTRAMUSCULAR | Qty: 30

## 2014-06-13 MED FILL — BUFFERED LIDOCAINE 1%: Qty: 20

## 2014-06-13 MED FILL — DEXAMETHASONE SOD PHOSPHATE PF 10 MG/ML IJ SOLN: 10 MG/ML | INTRAMUSCULAR | Qty: 2

## 2014-06-13 NOTE — Interval H&P Note (Signed)
I have interviewed and examined the patient and reviewed the recent History and Physical.  There have been no changes to the recent H&P documentation. The surgical consent form has been signed.    Last anticoagulant medication use was:    Premedication taken for contrast allergy?No    Valium taken for oral sedation? No    Outpatient Prescriptions Marked as Taking for the 06/13/14 encounter [facility] Encounter) with Willa Rough, MD   Medication Sig Dispense Refill   ??? vitamin B-12 (CYANOCOBALAMIN) 1000 MCG tablet Take 1,000 mcg by mouth daily     ??? ibuprofen (ADVIL;MOTRIN) 600 MG tablet Take 1 tablet by mouth every 8 hours as needed for Pain 120 tablet 3   ??? atorvastatin (LIPITOR) 20 MG tablet Take 20 mg by mouth daily      ??? NEXIUM 40 MG capsule Take 40 mg by mouth every morning (before breakfast)      ??? Multiple Vitamins-Iron (MULTI-VITAMIN/IRON) TABS Take  by mouth Daily.     ??? CALCIUM CARBONATE-VIT D-MIN PO Take  by mouth 2 times daily.     ??? aspirin 81 MG tablet Take 81 mg by mouth daily.     ??? citalopram (CELEXA) 10 MG tablet Take 10 mg by mouth. 1/2 tablet by mouth at bedtime     ??? LORazepam (ATIVAN) 0.5 MG tablet Take 0.5 mg by mouth. 1 tablet by mouth at bedtime PRN     ??? budesonide-formoterol (SYMBICORT) 160-4.5 MCG/ACT AERO Inhale 1 puff into the lungs 2 times daily.     ??? polyvinyl alcohol-povidone (HYPOTEARS) 1.4-0.6 % ophthalmic solution Place 1-2 drops into both eyes as needed.         The patient understands the planned operation and its associated risks and benefits and agrees to proceed.        Electronically signed by Willa Rough, MD on 06/13/2014 at 9:57 AM

## 2014-06-13 NOTE — Other (Addendum)
Patient Acct Nbr:  [id number]  Primary AUTH/CERT:    Katie Juarez Name:   [name] Name:    Primary Insurance Group Number:    Primary Insurance Plan Type: [facility]  Primary Insurance Policy Number:  [id number]    Secondary AUTH/CERT:    [place] Name:   [facility]  Secondary Insurance Plan Name:  [place]  Secondary Insurance Group Number:  [id number]  Secondary Insurance Plan Type: [employer]  Secondary Insurance Policy Number:  [id number]    Tertiary AUTH/CERT:    [place] Name:   [facility] Insurance Plan Name:  [facility] Insurance Group Number:    [facility] Insurance Plan Type: [employer] Policy Number:  [id number]

## 2014-06-13 NOTE — Op Note (Signed)
TRANSFORAMINAL EPIDURAL STEROID INJECTION    06/13/14    Surgeon: Willa Rough, MD    Pre-operative Diagnosis:   Patient Active Problem List   Diagnosis Code   ??? SS (spinal stenosis) M48.00   ??? 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 Low back pain M54.5   ??? 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 Lumbosacral stenosis M48.07   ??? 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 Pain of both sacroiliac joints M53.3   ??? Piriformis syndrome of left side G57.02   ??? Piriformis syndrome G57.00   ??? Lumbar radicular pain M54.16   ??? 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 Chronic low back pain M54.5, G89.29   ??? Spinal stenosis, lumbar M48.06       Post-operative Diagnosis: Same    Assistants: none    This is a 74 y.o. female patient with pain in the Back, left leg, right leg.?? Previous treatment and examination findings are noted in the H&P.BL5 transforaminal epidural injection has been requested for diagnostic and therapeutic reasons.    Conservative treatment was ineffective i.e.: ice, NSAIDS, rest, narcotic medication, chiropractic care, physical therapy and message therapy.    Patient is unable to perform the following ADL's: ambulating and grooming     Pain Assessment: 0-10  Pain Level: 5     Pain Orientation: Lower  Pain Location: Back       Last Plain films: 12/13/13      EXAMINATION:  1. BL5 transforaminal radiculogram/epidurogram.   2. BL5 transforaminal epidural anesthetic injection.   3. BL5 transforaminal epidural steroid injection.    CONSENT: Written consent was obtained from the patient on preprinted consent form after explaining the procedure, indications, potential complications and outcomes. Alternative treatments were also discussed.    DISCUSSION: The patient was sterilely prepped and draped in the usual fashion in the prone position. ???Time out??? was verified for correct patient, side, level and procedure.    SEDATION:   No conscious sedation was performed  during the procedure.?? The patient remained awake and conversed throughout the procedure.?? The patient underwent pulse oximetry and blood pressure monitoring independently by a trained observer, as well as by a physician.    PROCEDURE:  Under image-intensifier control, a 22 gauge needle x 5 inch spinal needle was guided successfully into the epidural space employing a posterior lateral/oblique approach. Needle aspiration was negative for heme or CSF.     Instillation of  .5 mL of Omnipaque 240 contrast medium opacified the spinal nerve and demonstrated contiguous flow into the epidural space. No vascular spread was noted. Digital subtraction was not employed to evaluate for vascular spread. The patient was monitored for any untoward reaction to contrast medium before proceeding with procedure #2. The patient did not report pain reproduction in a concordant distribution.    Following needle position verification, a test dose of .5 mL of sterile saline was administered and patient monitored for any adverse effects. Then, 1 mL of dexamethasone (Decadron 10 mg/mL), was instilled into the epidural space and the patient's response was again monitored.  Finally,  .5 ml of 0.5% bupivacaine was then instilled. The patient's response was again monitored. The spinal needle was removed.    The patient tolerated the procedure well and without complications and was noted to be in stable condition prior to discharge from the procedure center with discharge instructions.    EBL: no blood loss  SPECIMEN: none    IMPRESSIONS:  1. BL5 transforaminal epidurogram, epidural anesthesia and epidural steroid injection procedures accomplished without incident.      RECOMMENDATIONS:  1. Complete and return ???Post-Procedure Pain and Activity Diary.???  2. Contact the [facility] for symptom exacerbation, fever or unusual symptoms.   3. Post-procedure care according to verbal and written discharge instructions    POST-PROCEDURE  EPIDUROGRAPHY INTERPRETATION:    EXAMINATION: AP, lateral, and oblique views    FLUORO TIME: 31 seconds    DISCUSSION: Spot views of the spine reveal normal alignment and segmentation.  Spinal needle is positioned at the BL5 neuroforamin. Contrast spreads and outlines the BL5 nerve/ neuroforamin and epidural space. The epidurogram reveals excellent contrast flow. Visualized spine reveals Degenerative Joint Disease- L5. Soft tissues reveal no abnormalities.    IMPRESSION: BL5 transforaminal epidurogram/epineurogram reveals satisfactory needle position and contrast spread.     Electronically signed by Willa Rough, MD on 06/13/2014 at 9:58 AM

## 2014-06-13 NOTE — H&P (Signed)
Willa Rough, MD Physician Signed General Surgery H&P 05/30/2014  8:03 AM   Related encounter: Admission (Discharged) from OP Visit from 05/30/2014 in [place], NP  Nurse Practitioner  Signed    Progress Notes  05/09/2014 11:27 AM    Related encounter: Office Visit from 05/09/2014 in [facility] Pain Management        Expand All Collapse All   Subjective:         Patient ID: [name] is a 74 y.o. female.    HPI Underwent IESI's previously in 11/2013 and 12/2013, no longer effective. Low back pain, pain in buttocks as well as posterior thigh. Worse with sitting.      Allergies     Allergen   Reactions     ???   Pcn [Penicillins]        ???   Zithromax [Azithromycin]                Active Medications      Outpatient Prescriptions Marked as Taking for the 05/09/14 encounter (Office Visit) with [name], NP     Medication   Sig   Dispense   Refill     ???   sulfamethoxazole-trimethoprim (BACTRIM DS) 800-160 MG per tablet   Take 1 tablet by mouth 2 times daily for 7 days   14 tablet   0     ???   vitamin B-12 (CYANOCOBALAMIN) 1000 MCG tablet   Take 1,000 mcg by mouth daily           ???   ibuprofen (ADVIL;MOTRIN) 600 MG tablet   Take 1 tablet by mouth every 8 hours as needed for Pain   120 tablet   3     ???   atorvastatin (LIPITOR) 20 MG tablet   Take 20 mg by mouth daily            ???   NEXIUM 40 MG capsule   Take 40 mg by mouth every morning (before breakfast)            ???   RA LORATADINE 10 MG tablet   Take 10 mg by mouth daily       0     ???   Multiple Vitamins-Iron (MULTI-VITAMIN/IRON) TABS   Take  by mouth Daily.           ???   CALCIUM CARBONATE-VIT D-MIN PO   Take  by mouth 2 times daily.           ???   aspirin 81 MG tablet   Take 81 mg by mouth daily.           ???   citalopram (CELEXA) 10 MG tablet   Take 10 mg by mouth. 1/2 tablet by mouth at bedtime           ???   LORazepam (ATIVAN) 0.5 MG tablet   Take 0.5 mg by mouth. 1 tablet by mouth at bedtime PRN           ???    budesonide-formoterol (SYMBICORT) 160-4.5 MCG/ACT AERO   Inhale 1 puff into the lungs 2 times daily.           ???   polyvinyl alcohol-povidone (HYPOTEARS) 1.4-0.6 % ophthalmic solution   Place 1-2 drops into both eyes as needed.           ???   nitrofurantoin,  macrocrystal-monohydrate, (MACROBID) 100 MG capsule   Take 100 mg by mouth 2 times daily.                       Past Medical History      Past Medical History     Diagnosis   Date     ???   Breast cancer ([place])        ???   SS (spinal stenosis)        ???   DDD (degenerative disc disease)        ???   LBP (low back pain)        ???   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   Hysterectomy           ???   Bladder suspension           ???   Cholecystectomy           ???   Breast lumpectomy           ???   Other surgical history      11/14/13, 06/23/11, 06/30/11, 07/12/11 12/29/11           L4/L5 IESI     ???   Other surgical history      12/17/13           L5/S1 IESI             Review of Systems    Constitutional: Positive for fatigue.   Respiratory: Negative for shortness of breath.     Cardiovascular: Positive for leg swelling. Negative for chest pain.    Gastrointestinal: Positive for constipation.   Genitourinary: Negative for difficulty urinating.    Musculoskeletal: Positive for myalgias, back pain, joint swelling, arthralgias and neck pain.   Neurological: Positive for weakness, numbness and headaches.   Psychiatric/Behavioral: Positive for sleep disturbance.        Objective:     Physical Exam   Constitutional: She is oriented to person, place, and time. Vital signs are normal. She appears well-developed and well-nourished. She is cooperative. No distress.    HENT:    Head: Normocephalic and atraumatic.    Cardiovascular: Normal rate and intact distal pulses.     Pulmonary/Chest: Effort normal. No respiratory distress.   Musculoskeletal:        Lumbar back: She  exhibits decreased range of motion (flexion induces pain), tenderness, bony tenderness (bilateral PSIS) and pain.       Back:    TYPE OF EXAM: MRI lumbar spine without contrast  DATE OF EXAM: 12/13/13  COMPARISON: 12/21/10  CLINICAL INDICATION: Degeneration of intervertebral disc of the lumbar region, lumbar spinal stenosis  FINDINGS: 2 mm retrolisthesis of L5 on S1, 2 mm anterolisthesis of L4 on L5, and 2 mm retrolisthesis of L3 on L4, no pars interarticularis defect evident. Conus at the level of LI and appears unremarkable. Mild degenerative disease within the lumbar   spine. Cystic structures posterior to L4/5 facet joints and adjacent to the spinous process of L5 likely related to synovial cysts, largest is on the right measuring 1.6 cm. Fluid within L4/5 facet joints may be on a degenerative or inflammatory basis. 1  cm cyst  lower pole left kidney unchanged.   L5/S1 disc bulge asymmetric to the left. Facet hypertrophic degenerative changes and ligamentum flavum hypertrophy are present. No central canal stenosis. Mild narrowing right neural foramen. Severe narrowing left neural foramen, facet hypertrophic   degenerative changes and disc contact exiting nerve root within the left neural foramen and disc contacts exiting nerve root just lateral to the left neural foramen.  L4/5 disc bulge along with facet hypertrophic degenerative changes. Severe central canal stenosis anterior posterior dimension of the thecal sac measures 6 mm. Mild narrowing bilateral neural foramina.  L3/4 left neural foraminal/lateral disc protrusion superimposed on disc bulge. Facet hypertrophic degenerative changes and ligamentum flavum hypertrophy present. No central canal stenosis. Mild narrowing right neural foramen. Moderate narrowing left   neural foramen, disc contacts exiting nerve root in the distal left neural foramen and just lateral to the left neural foramen.  L2/3 mild disc bulge slightly asymmetric to the left. Facet  hypertrophic degenerative changes and ligamentum flavum hypertrophy present. No central canal stenosis. Right neural foramen patent. Mild narrowing left neural foramen.  L1/2 no disc herniation, central canal stenosis, or neural foramina compromise. Mild facet hypertrophic degenerative changes are present.  IMPRESSION: 1. Degenerative changes result in severe central canal stenosis at L4/5 along with varying degrees of neural foraminal narrowing within the lumbar spine. See above for description of findings at each level. Overall, findings are similar when compared to   prior exam.          Neurological: She is alert and oriented to person, place, and time. She is not disoriented. No cranial nerve deficit.   Negative SLR    Skin: Skin is warm, dry and intact. She is not diaphoretic. No cyanosis. No pallor. Nails show no clubbing.   Psychiatric: She has a normal mood and affect. Her speech is normal.    Vitals reviewed.        Assessment:        1.   Pain of both sacroiliac joints      2.   Piriformis syndrome of left side      3.   Piriformis syndrome, right      4.   Lumbar radicular pain      5.   Neural foraminal stenosis of lumbar spine      6.   Chronic low back pain      7.   Spinal stenosis, lumbar                        Plan:      Schedule bilateral SIJ/piriformis injection. Two weeks later bilateral L5 TFE     Informed consent has been obtained for procedure. Roselyn Reef Dishongis  on blood thinners. Medications to hold have been reviewed with patient.Blood thinners must be held with permission from your cardiologist or primary care physician. A letter is required to besent to PCP/Cardiologist regarding holding medications for procedure to decrease bleeding risk. Blood Thinners (antiplatelet/) medications that must be held prior to spinal cord stimulator trials, and implants, interlaminar epidurals, caudal epidurals, or transforaminal epidural injections:     Antiplatelets Aspirin, Effient, Plavix,Ticlid,   Brilinta, Savaysa for 3-5 days  Anticoagulant: Coumadin, Eliquis, Lovenox, Heparin, Pradaxa, Xarelto for 5-7 days.  All antiplatelets/anticoagulants must be held for 7 days for a spinal cord stimulator.  [name], NP Nurse Practitioner Signed  Progress Notes 05/09/2014 11:27 AM   Related encounter: Office Visit from 05/09/2014 in [facility] Pain Management      Expand All Collapse All   Subjective:        Patient ID: ASTRYD PEARCY is a 74 y.o. female.    HPI Underwent IESI's previously in 11/2013 and 12/2013, no longer effective. Low back pain, pain in buttocks as well as posterior thigh. Worse with sitting.      Allergies    Allergen  Reactions    ???  Pcn [Penicillins]      ???  Zithromax [Azithromycin]             Active Medications    Outpatient Prescriptions Marked as Taking for the 05/09/14 encounter (Office Visit) with [name], NP    Medication  Sig  Dispense  Refill    ???  sulfamethoxazole-trimethoprim (BACTRIM DS) 800-160 MG per tablet  Take 1 tablet by mouth 2 times daily for 7 days  14 tablet  0    ???  vitamin B-12 (CYANOCOBALAMIN) 1000 MCG tablet  Take 1,000 mcg by mouth daily        ???  ibuprofen (ADVIL;MOTRIN) 600 MG tablet  Take 1 tablet by mouth every 8 hours as needed for Pain  120 tablet  3    ???  atorvastatin (LIPITOR) 20 MG tablet  Take 20 mg by mouth daily         ???  NEXIUM 40 MG capsule  Take 40 mg by mouth every morning (before breakfast)         ???  RA LORATADINE 10 MG tablet  Take 10 mg by mouth daily     0    ???  Multiple Vitamins-Iron (MULTI-VITAMIN/IRON) TABS  Take  by mouth Daily.        ???  CALCIUM CARBONATE-VIT D-MIN PO  Take  by mouth 2 times daily.        ???  aspirin 81 MG tablet  Take 81 mg by mouth daily.        ???  citalopram (CELEXA) 10 MG tablet  Take 10 mg by mouth. 1/2 tablet by mouth at bedtime        ???  LORazepam (ATIVAN) 0.5 MG tablet  Take 0.5 mg by mouth. 1 tablet by mouth at bedtime PRN        ???   budesonide-formoterol (SYMBICORT) 160-4.5 MCG/ACT AERO  Inhale 1 puff into the lungs 2 times daily.        ???  polyvinyl alcohol-povidone (HYPOTEARS) 1.4-0.6 % ophthalmic solution  Place 1-2 drops into both eyes as needed.        ???  nitrofurantoin, macrocrystal-monohydrate, (MACROBID) 100 MG capsule  Take 100 mg by mouth 2 times daily.                  Past Medical History    Past Medical History    Diagnosis  Date    ???  Breast cancer ([place])      ???  SS (spinal stenosis)      ???  DDD (degenerative disc disease)      ???  LBP (low back pain)      ???  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  Hysterectomy        ???  Bladder suspension        ???  Cholecystectomy        ???  Breast lumpectomy        ???  Other surgical history    11/14/13, 06/23/11, 06/30/11, 07/12/11 12/29/11        L4/L5 IESI    ???  Other surgical history    12/17/13        L5/S1 IESI           Review of Systems   Constitutional: Positive for fatigue.   Respiratory: Negative for shortness of breath.    Cardiovascular: Positive for leg swelling. Negative for chest pain.   Gastrointestinal: Positive for constipation.   Genitourinary: Negative for difficulty urinating.   Musculoskeletal: Positive for myalgias, back pain, joint swelling, arthralgias and neck pain.   Neurological: Positive for weakness, numbness and headaches.   Psychiatric/Behavioral: Positive for sleep disturbance.        Objective:    Physical Exam   Constitutional: She is oriented to person, place, and time. Vital signs are normal. She appears well-developed and well-nourished. She is cooperative. No distress.   HENT:    Head: Normocephalic and atraumatic.   Cardiovascular: Normal rate and intact distal pulses.    Pulmonary/Chest: Effort normal. No respiratory distress.   Musculoskeletal:        Lumbar back: She exhibits decreased range of motion (flexion induces pain), tenderness, bony  tenderness (bilateral PSIS) and pain.       Back:    TYPE OF EXAM: MRI lumbar spine without contrast  DATE OF EXAM: 12/13/13  COMPARISON: 12/21/10  CLINICAL INDICATION: Degeneration of intervertebral disc of the lumbar region, lumbar spinal stenosis  FINDINGS: 2 mm retrolisthesis of L5 on S1, 2 mm anterolisthesis of L4 on L5, and 2 mm retrolisthesis of L3 on L4, no pars interarticularis defect evident. Conus at the level of LI and appears unremarkable. Mild degenerative disease within the lumbar   spine. Cystic structures posterior to L4/5 facet joints and adjacent to the spinous process of L5 likely related to synovial cysts, largest is on the right measuring 1.6 cm. Fluid within L4/5 facet joints may be on a degenerative or inflammatory basis. 1  cm cyst lower pole left kidney unchanged.   L5/S1 disc bulge asymmetric to the left. Facet hypertrophic degenerative changes and ligamentum flavum hypertrophy are present. No central canal stenosis. Mild narrowing right neural foramen. Severe narrowing left neural foramen, facet hypertrophic   degenerative changes and disc contact exiting nerve root within the left neural foramen and disc contacts exiting nerve root just lateral to the left neural foramen.  L4/5 disc bulge along with facet hypertrophic degenerative changes. Severe central canal stenosis anterior posterior dimension of the thecal sac measures 6 mm. Mild narrowing bilateral neural foramina.  L3/4 left neural foraminal/lateral disc protrusion superimposed on disc bulge. Facet hypertrophic degenerative changes and ligamentum flavum hypertrophy present. No central canal stenosis. Mild narrowing right neural foramen. Moderate narrowing left   neural foramen, disc contacts exiting nerve root in the distal left neural foramen and just lateral to the left neural foramen.  L2/3 mild disc bulge slightly asymmetric to the left. Facet hypertrophic degenerative changes and ligamentum flavum hypertrophy present. No  central canal stenosis. Right neural foramen patent. Mild narrowing left neural foramen.  L1/2 no disc herniation, central canal stenosis, or neural foramina compromise. Mild facet hypertrophic degenerative changes are present.  IMPRESSION: 1. Degenerative  changes result in severe central canal stenosis at L4/5 along with varying degrees of neural foraminal narrowing within the lumbar spine. See above for description of findings at each level. Overall, findings are similar when compared to   prior exam.          Neurological: She is alert and oriented to person, place, and time. She is not disoriented. No cranial nerve deficit.   Negative SLR   Skin: Skin is warm, dry and intact. She is not diaphoretic. No cyanosis. No pallor. Nails show no clubbing.   Psychiatric: She has a normal mood and affect. Her speech is normal.   Vitals reviewed.        Assessment:       1.  Pain of both sacroiliac joints     2.  Piriformis syndrome of left side     3.  Piriformis syndrome, right     4.  Lumbar radicular pain     5.  Neural foraminal stenosis of lumbar spine     6.  Chronic low back pain     7.  Spinal stenosis, lumbar                      Plan:     Schedule bilateral SIJ/piriformis injection. Two weeks later bilateral L5 TFE     Informed consent has been obtained for procedure. Roselyn Reef Dishongis  on blood thinners. Medications to hold have been reviewed with patient.Blood thinners must be held with permission from your cardiologist or primary care physician. A letter is required to besent to PCP/Cardiologist regarding holding medications for procedure to decrease bleeding risk. Blood Thinners (antiplatelet/) medications that must be held prior to spinal cord stimulator trials, and implants, interlaminar epidurals, caudal epidurals, or transforaminal epidural injections:     Antiplatelets Aspirin, Effient, Plavix,Ticlid,  Brilinta, Savaysa for 3-5 days  Anticoagulant: Coumadin, Eliquis, Lovenox, Heparin, Pradaxa, Xarelto  for 5-7 days.  All antiplatelets/anticoagulants must be held for 7 days for a spinal cord stimulator.

## 2014-09-10 ENCOUNTER — Ambulatory Visit: Admit: 2014-09-10 | Payer: MEDICARE | Attending: Physical Medicine & Rehabilitation | Primary: Family Medicine

## 2014-09-10 DIAGNOSIS — M47816 Spondylosis without myelopathy or radiculopathy, lumbar region: Secondary | ICD-10-CM

## 2014-09-10 NOTE — Progress Notes (Signed)
PAIN MANAGEMENT FOLLOW-UP NOTE  09/10/14    CHIEF COMPLAINT: This is a 74 y.o. female patient who returns to the Pain Management Clinic with a history of No chief complaint on file.      PAIN HPI: Katie Juarez returns today for  reevaluation.  Since the visit, the patient reports that the pain is not changed. Seems that S-I and ESIS are now not effective for B  Gluteal and  Posterior upper thigh pain    ANALGESIA:   Are your Current Pain medication (s) helping to decrease pain?  No.   Current Pain score:      ADVERSE AFFECTS:   Medication Side Effects: No.    ACTIVITY:  Are you able to be more active with your pain medications? No      ABERRANT BEHAVIORS SINCE LAST VISIT? No    Review of Systems   Constitutional: Positive for fatigue.   HENT: Negative.    Eyes: Negative.    Respiratory: Negative.    Cardiovascular: Negative.    Gastrointestinal: Negative for constipation and diarrhea.   Endocrine: Negative.    Genitourinary: Negative for difficulty urinating and flank pain.   Musculoskeletal: Positive for back pain and myalgias.   Skin: Negative.    Allergic/Immunologic: Negative.    Neurological: Positive for weakness and numbness.   Hematological: Negative.    Psychiatric/Behavioral: Positive for sleep disturbance.        Allergies   Allergen Reactions   ??? Pcn [Penicillins] Hives   ??? Zithromax [Azithromycin] Hives       Outpatient Medications Prior to Visit   Medication Sig Dispense Refill   ??? ibuprofen (ADVIL;MOTRIN) 600 MG tablet Take 1 tablet by mouth every 8 hours as needed for Pain 120 tablet 3   ??? atorvastatin (LIPITOR) 20 MG tablet Take 20 mg by mouth daily      ??? NEXIUM 40 MG capsule Take 40 mg by mouth every morning (before breakfast)      ??? RA LORATADINE 10 MG tablet Take 10 mg by mouth daily   0   ??? Multiple Vitamins-Iron (MULTI-VITAMIN/IRON) TABS Take  by mouth Daily.     ??? CALCIUM CARBONATE-VIT D-MIN PO Take  by mouth 2 times daily.     ??? aspirin 81 MG tablet Take 81 mg by mouth daily.     ???  citalopram (CELEXA) 10 MG tablet Take 10 mg by mouth nightly      ??? LORazepam (ATIVAN) 0.5 MG tablet Take 0.5 mg by mouth. 1 tablet by mouth at bedtime PRN     ??? budesonide-formoterol (SYMBICORT) 160-4.5 MCG/ACT AERO Inhale 1 puff into the lungs 2 times daily.     ??? polyvinyl alcohol-povidone (HYPOTEARS) 1.4-0.6 % ophthalmic solution Place 1-2 drops into both eyes as needed.     ??? vitamin B-12 (CYANOCOBALAMIN) 1000 MCG tablet Take 1,000 mcg by mouth daily       No facility-administered medications prior to visit.        Past Medical History   Diagnosis Date   ??? Breast cancer ([place])    ??? DDD (degenerative disc disease)    ??? LBP (low back pain)    ??? 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 SS (spinal stenosis)        Past Surgical History   Procedure Laterality Date   ??? Hysterectomy     ??? Bladder suspension     ??? Cholecystectomy     ??? Breast lumpectomy     ???  Other surgical history  11/14/13, 06/23/11, 06/30/11, 07/12/11 12/29/11     L4/L5 IESI   ??? Other surgical history  12/17/13     L5/S1 IESI   ??? Other surgical history Bilateral 05/30/14     SIJ and piriformis       Family and Social History reviewed in the electronic medical record.    Imaging: Reviewed available imaging in our system with the patient.  No results found.    Objective:     Physical Exam:  Vitals:    09/10/14 0812   Weight: 153 lb 9.6 oz (69.7 kg)          Physical Exam   Constitutional: She is oriented to person, place, and time. She appears well-developed and well-nourished. No distress.   HENT:   Head: Normocephalic and atraumatic.   Right Ear: External ear normal. No decreased hearing is noted.   Left Ear: External ear normal. No decreased hearing is noted.   Nose: Nose normal.   Mouth/Throat: Oropharynx is clear and moist and mucous membranes are normal.   Eyes: Conjunctivae and lids are normal. No scleral icterus.   Neck: Phonation normal.   Cardiovascular:   No BLE edema present   Pulmonary/Chest: Effort normal. No accessory muscle  usage. No respiratory distress.   Abdominal: Normal appearance.   Genitourinary:   Genitourinary Comments: deferred   Neurological: She is alert and oriented to person, place, and time.   Skin: Skin is warm, dry and intact. No rash noted. She is not diaphoretic. No cyanosis or erythema. No pallor.   Psychiatric: She has a normal mood and affect. Her speech is normal and behavior is normal.     Back Exam     Comments:  Mild Kemps B L3,4,5  Slump +  Fabers +                                   Assessment:   This is a 74 y.o. female patient with:    Diagnosis:   No diagnosis found.    Medical Comorbidities:  As listed in the patient's past medical and surgical history    Functional Limitations:   Pain limits function and quality of life.     Plan:     B L3,4,5 MBB Diagnostic  Meds:   Controlled Substances Monitoring: Pt educated about the risks of taking opiates, including increased sedation, constipation, slowed breathing, tolerance, dependence, and addiction.      New Prescriptions    No medications on file      No orders of the defined types were placed in this encounter.    No orders of the defined types were placed in this encounter.      No Follow-up on file.    The patient expressed understanding of the above assessment and plan.    Total time spent face to face with patient was 20 minutes in which  50% or more of the time was spent in counseling, education about risk and benefits of the above plan, and coordination of care.

## 2014-09-10 NOTE — Patient Instructions (Signed)
[facility]  [address] [place], [place] [address]  Telephone [phone]  Fax [phone]      PROCEDURE INSTRUCTIONS FOR  PAIN MANAGEMENT PROCEDURES WITHOUT IV SEDATION    Katie Juarez scheduled to see Dr. Marcello Moores to undergo the following procedure: Bilateral L3,L4,L5 Diagnostic MBB    Procedure Date: ____09/06/2016____  Arrival Time: ____01:00PM____     Report to the [place], Registration office on the 1st floor in the hospital, after check in and signing of paper work you will then go to the second floor to the surgery center.    1. Stop the following medications prior to the procedure: NONE    2.  Take all routine medications unless otherwise instructed. Ok to take vitamins and antiinflammatory medications    3.  EATING & DRINKING:  Ok to eat or drink before the injection - no IV sedation will be used.     4. If you are allergic to contrast or iodine, you must take benadryl and prednisone prior to the injection to prevent an allergic reaction. Follow the directions on the prescription for the times to take the medication.    5. Oral valium can be prescribed if your are anxious about the injections or feel that you can not lay still during the injection. If you take valium, you must have a driver. Make arrangements for a family member or friend to drive you to the surgery center.  Your ride must stay in the hospital while you are having the injection done. If they cannot stay, the injection will be rescheduled. The valium may affect your judgment following the procedure and driving a vehicle within 24 hours after the sedation could be dangerous.    6.  Wear simple loose clothing, which can be easily changed.      7.  Leave jewelry (including rings) and other valuables at home.     8.  You will be asked to sign several forms prior to surgery; patients under the age of 83 must have a parent or legal guardian sign the permit to be able to do the procedure.      9.  You must  have finished any antibiotic prescribed for recent infections. If required, please take pre-procedure antibiotic or other pre-procedure medications as instructed.      10. Bring inhalers and pain medications with you to your procedure.      11. Bring your MRI/CT films if they were done outside of the [facility]    12. If you should develop a cold, sore throat, cough, fever or other new indication of illness or infection, or are started on antibiotics within 2 weeks of the scheduled procedure, please notify the Doctor???s office as early as possible at [phone].

## 2014-09-16 ENCOUNTER — Encounter: Admit: 2014-09-16 | Primary: Family Medicine

## 2014-09-16 ENCOUNTER — Encounter: Admit: 2014-09-16 | Attending: Physical Medicine & Rehabilitation | Primary: Family Medicine

## 2014-09-16 MED ORDER — LIDOCAINE HCL (PF) 2 % IJ SOLN
2 % | INTRAMUSCULAR | Status: AC
Start: 2014-09-16 — End: ?

## 2014-09-16 MED ORDER — NORMAL SALINE FLUSH 0.9 % IV SOLN
0.9 % | Freq: Two times a day (BID) | INTRAVENOUS | Status: DC
Start: 2014-09-16 — End: 2014-09-16

## 2014-09-16 MED ORDER — BUFFERED LIDOCAINE 1%
Status: AC
Start: 2014-09-16 — End: ?

## 2014-09-16 MED ORDER — NORMAL SALINE FLUSH 0.9 % IV SOLN
0.9 % | INTRAVENOUS | Status: DC | PRN
Start: 2014-09-16 — End: 2014-09-16

## 2014-09-16 MED FILL — LIDOCAINE HCL (PF) 2 % IJ SOLN: 2 % | INTRAMUSCULAR | Qty: 10

## 2014-09-16 MED FILL — BUFFERED LIDOCAINE 1%: Qty: 20

## 2014-09-16 NOTE — H&P (Signed)
Katie Rough, MD Physician Signed  Progress Notes Date of Service: 09/10/2014  8:18 AM   Related encounter: Office Visit from 09/10/2014 in [facility]      Expand All Collapse All    Hide copied text  Hover for attribution information  PAIN MANAGEMENT FOLLOW-UP NOTE  09/10/14     CHIEF COMPLAINT: This is a 74 y.o. female patient who returns to the Pain Management Clinic with a history of No chief complaint on file.        PAIN HPI: Katie Juarez returns today for reevaluation. Since the visit, the patient reports that the pain is not changed. Seems that S-I and ESIS are now not effective for B Gluteal and Posterior upper thigh pain     ANALGESIA:   Are your Current Pain medication (s) helping to decrease pain? No.    Current Pain score:       ADVERSE AFFECTS:   Medication Side Effects: No.     ACTIVITY:  Are you able to be more active with your pain medications? No        ABERRANT BEHAVIORS SINCE LAST VISIT? No     Review of Systems   Constitutional: Positive for fatigue.   HENT: Negative.   Eyes: Negative.   Respiratory: Negative.   Cardiovascular: Negative.   Gastrointestinal: Negative for constipation and diarrhea.   Endocrine: Negative.   Genitourinary: Negative for difficulty urinating and flank pain.   Musculoskeletal: Positive for back pain and myalgias.   Skin: Negative.   Allergic/Immunologic: Negative.   Neurological: Positive for weakness and numbness.   Hematological: Negative.   Psychiatric/Behavioral: Positive for sleep disturbance.              Allergies    Allergen  Reactions    ???  Pcn [Penicillins]  Hives    ???  Zithromax [Azithromycin]  Hives           Medications Prior to Visit           Outpatient Medications Prior to Visit    Medication  Sig  Dispense  Refill    ???  ibuprofen (ADVIL;MOTRIN) 600 MG tablet  Take 1 tablet by mouth every 8 hours as needed for Pain  120 tablet  3    ???  atorvastatin (LIPITOR) 20 MG tablet  Take 20 mg by mouth daily         ???  NEXIUM 40 MG capsule  Take 40 mg  by mouth every morning (before breakfast)         ???  RA LORATADINE 10 MG tablet  Take 10 mg by mouth daily     0    ???  Multiple Vitamins-Iron (MULTI-VITAMIN/IRON) TABS  Take by mouth Daily.        ???  CALCIUM CARBONATE-VIT D-MIN PO  Take by mouth 2 times daily.        ???  aspirin 81 MG tablet  Take 81 mg by mouth daily.        ???  citalopram (CELEXA) 10 MG tablet  Take 10 mg by mouth nightly         ???  LORazepam (ATIVAN) 0.5 MG tablet  Take 0.5 mg by mouth. 1 tablet by mouth at bedtime PRN        ???  budesonide-formoterol (SYMBICORT) 160-4.5 MCG/ACT AERO  Inhale 1 puff into the lungs 2 times daily.        ???  polyvinyl alcohol-povidone (HYPOTEARS) 1.4-0.6 %  ophthalmic solution  Place 1-2 drops into both eyes as needed.        ???  vitamin B-12 (CYANOCOBALAMIN) 1000 MCG tablet  Take 1,000 mcg by mouth daily           No facility-administered medications prior to visit.               Past Medical History         Past Medical History    Diagnosis  Date    ???  Breast cancer ([place])      ???  DDD (degenerative disc disease)      ???  LBP (low back pain)      ???  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  SS (spinal stenosis)                Past Surgical History           Past Surgical History    Procedure  Laterality  Date    ???  Hysterectomy        ???  Bladder suspension        ???  Cholecystectomy        ???  Breast lumpectomy        ???  Other surgical history    11/14/13, 06/23/11, 06/30/11, 07/12/11 12/29/11        L4/L5 IESI    ???  Other surgical history    12/17/13        L5/S1 IESI    ???  Other surgical history  Bilateral  05/30/14        SIJ and piriformis             Family and Social History reviewed in the electronic medical record.     Imaging: Reviewed available imaging in our system with the patient.  No results found.     Objective:       Physical Exam:   Vitals    Vitals:      09/10/14 0812    Weight:  153 lb 9.6 oz (69.7 kg)                Physical Exam   Constitutional: She is oriented to person, place, and time.  She appears well-developed and well-nourished. No distress.   HENT:    Head: Normocephalic and atraumatic.   Right Ear: External ear normal. No decreased hearing is noted.   Left Ear: External ear normal. No decreased hearing is noted.    Nose: Nose normal.    Mouth/Throat: Oropharynx is clear and moist and mucous membranes are normal.   Eyes: Conjunctivae and lids are normal. No scleral icterus.   Neck: Phonation normal.   Cardiovascular:   No BLE edema present   Pulmonary/Chest: Effort normal. No accessory muscle usage. No respiratory distress.   Abdominal: Normal appearance.   Genitourinary:   Genitourinary Comments: deferred   Neurological: She is alert and oriented to person, place, and time.   Skin: Skin is warm, dry and intact. No rash noted. She is not diaphoretic. No cyanosis or erythema. No pallor.   Psychiatric: She has a normal mood and affect. Her speech is normal and behavior is normal.      Back Exam      Comments: Mild [place] B L3,4,5  Slump +  [place] +            Assessment:    This is a 74 y.o. female  patient with:     Diagnosis:    No diagnosis found.     Medical Comorbidities:  As listed in the patient's past medical and surgical history     Functional Limitations:    Pain limits function and quality of life.      Plan:       B L3,4,5 MBB Diagnostic  Meds:    Controlled Substances Monitoring: Pt educated about the risks of taking opiates, including increased sedation, constipation, slowed breathing, tolerance, dependence, and addiction.           New Prescriptions      No medications on file           Encounter Medications     No orders of the defined types were placed in this encounter.      No orders of the defined types were placed in this encounter.        No Follow-up on file.     The patient expressed understanding of the above assessment and plan.     Total time spent face to face with patient was 20 minutes in which 50% or more of the time was spent in counseling, education about risk and  benefits of the above plan, and coordination of care.                      Katie Rough, MD Physician Signed General Surgery H&P 05/30/2014  8:03 AM   Related encounter: Admission (Discharged) from OP Visit from 05/30/2014 in [place], NP  Nurse Practitioner  Signed    Progress Notes  05/09/2014 11:27 AM    Related encounter: Office Visit from 05/09/2014 in [facility]        Expand All Collapse All   Subjective:         Patient ID: Katie Juarez is a 74 y.o. female.    HPI Underwent IESI's previously in 11/2013 and 12/2013, no longer effective. Low back pain, pain in buttocks as well as posterior thigh. Worse with sitting.      Allergies     Allergen   Reactions     ???   Pcn [Penicillins]        ???   Zithromax [Azithromycin]                Active Medications      Outpatient Prescriptions Marked as Taking for the 05/09/14 encounter (Office Visit) with [name], NP     Medication   Sig   Dispense   Refill     ???   sulfamethoxazole-trimethoprim (BACTRIM DS) 800-160 MG per tablet   Take 1 tablet by mouth 2 times daily for 7 days   14 tablet   0     ???   vitamin B-12 (CYANOCOBALAMIN) 1000 MCG tablet   Take 1,000 mcg by mouth daily           ???   ibuprofen (ADVIL;MOTRIN) 600 MG tablet   Take 1 tablet by mouth every 8 hours as needed for Pain   120 tablet   3     ???   atorvastatin (LIPITOR) 20 MG tablet   Take 20 mg by mouth daily            ???   NEXIUM 40 MG capsule   Take 40 mg by mouth every morning (before breakfast)            ???  RA LORATADINE 10 MG tablet   Take 10 mg by mouth daily       0     ???   Multiple Vitamins-Iron (MULTI-VITAMIN/IRON) TABS   Take  by mouth Daily.           ???   CALCIUM CARBONATE-VIT D-MIN PO   Take  by mouth 2 times daily.           ???   aspirin 81 MG tablet   Take 81 mg by mouth daily.           ???   citalopram (CELEXA) 10 MG tablet   Take 10 mg by mouth. 1/2 tablet by mouth at bedtime           ???   LORazepam (ATIVAN) 0.5 MG  tablet   Take 0.5 mg by mouth. 1 tablet by mouth at bedtime PRN           ???   budesonide-formoterol (SYMBICORT) 160-4.5 MCG/ACT AERO   Inhale 1 puff into the lungs 2 times daily.           ???   polyvinyl alcohol-povidone (HYPOTEARS) 1.4-0.6 % ophthalmic solution   Place 1-2 drops into both eyes as needed.           ???   nitrofurantoin, macrocrystal-monohydrate, (MACROBID) 100 MG capsule   Take 100 mg by mouth 2 times daily.                       Past Medical History      Past Medical History     Diagnosis   Date     ???   Breast cancer ([place])        ???   SS (spinal stenosis)        ???   DDD (degenerative disc disease)        ???   LBP (low back pain)        ???   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   Hysterectomy           ???   Bladder suspension           ???   Cholecystectomy           ???   Breast lumpectomy           ???   Other surgical history      11/14/13, 06/23/11, 06/30/11, 07/12/11 12/29/11           L4/L5 IESI     ???   Other surgical history      12/17/13           L5/S1 IESI             Review of Systems    Constitutional: Positive for fatigue.   Respiratory: Negative for shortness of breath.     Cardiovascular: Positive for leg swelling. Negative for chest pain.    Gastrointestinal: Positive for constipation.   Genitourinary: Negative for difficulty urinating.    Musculoskeletal: Positive for myalgias, back pain, joint swelling, arthralgias and neck pain.   Neurological: Positive for weakness, numbness and headaches.   Psychiatric/Behavioral: Positive for sleep disturbance.  Objective:     Physical Exam   Constitutional: She is oriented to person, place, and time. Vital signs are normal. She appears well-developed and well-nourished. She is cooperative. No distress.    HENT:    Head: Normocephalic and atraumatic.    Cardiovascular: Normal rate and intact distal pulses.     Pulmonary/Chest: Effort  normal. No respiratory distress.   Musculoskeletal:        Lumbar back: She exhibits decreased range of motion (flexion induces pain), tenderness, bony tenderness (bilateral PSIS) and pain.       Back:    TYPE OF EXAM: MRI lumbar spine without contrast  DATE OF EXAM: 12/13/13  COMPARISON: 12/21/10  CLINICAL INDICATION: Degeneration of intervertebral disc of the lumbar region, lumbar spinal stenosis  FINDINGS: 2 mm retrolisthesis of L5 on S1, 2 mm anterolisthesis of L4 on L5, and 2 mm retrolisthesis of L3 on L4, no pars interarticularis defect evident. Conus at the level of LI and appears unremarkable. Mild degenerative disease within the lumbar   spine. Cystic structures posterior to L4/5 facet joints and adjacent to the spinous process of L5 likely related to synovial cysts, largest is on the right measuring 1.6 cm. Fluid within L4/5 facet joints may be on a degenerative or inflammatory basis. 1  cm cyst lower pole left kidney unchanged.   L5/S1 disc bulge asymmetric to the left. Facet hypertrophic degenerative changes and ligamentum flavum hypertrophy are present. No central canal stenosis. Mild narrowing right neural foramen. Severe narrowing left neural foramen, facet hypertrophic   degenerative changes and disc contact exiting nerve root within the left neural foramen and disc contacts exiting nerve root just lateral to the left neural foramen.  L4/5 disc bulge along with facet hypertrophic degenerative changes. Severe central canal stenosis anterior posterior dimension of the thecal sac measures 6 mm. Mild narrowing bilateral neural foramina.  L3/4 left neural foraminal/lateral disc protrusion superimposed on disc bulge. Facet hypertrophic degenerative changes and ligamentum flavum hypertrophy present. No central canal stenosis. Mild narrowing right neural foramen. Moderate narrowing left   neural foramen, disc contacts exiting nerve root in the distal left neural foramen and just lateral to the left neural  foramen.  L2/3 mild disc bulge slightly asymmetric to the left. Facet hypertrophic degenerative changes and ligamentum flavum hypertrophy present. No central canal stenosis. Right neural foramen patent. Mild narrowing left neural foramen.  L1/2 no disc herniation, central canal stenosis, or neural foramina compromise. Mild facet hypertrophic degenerative changes are present.  IMPRESSION: 1. Degenerative changes result in severe central canal stenosis at L4/5 along with varying degrees of neural foraminal narrowing within the lumbar spine. See above for description of findings at each level. Overall, findings are similar when compared to   prior exam.          Neurological: She is alert and oriented to person, place, and time. She is not disoriented. No cranial nerve deficit.   Negative SLR    Skin: Skin is warm, dry and intact. She is not diaphoretic. No cyanosis. No pallor. Nails show no clubbing.   Psychiatric: She has a normal mood and affect. Her speech is normal.    Vitals reviewed.        Assessment:        1.   Pain of both sacroiliac joints      2.   Piriformis syndrome of left side      3.   Piriformis syndrome, right      4.  Lumbar radicular pain      5.   Neural foraminal stenosis of lumbar spine      6.   Chronic low back pain      7.   Spinal stenosis, lumbar                        Plan:      Schedule bilateral SIJ/piriformis injection. Two weeks later bilateral L5 TFE     Informed consent has been obtained for procedure. Roselyn Reef Dishongis  on blood thinners. Medications to hold have been reviewed with patient.Blood thinners must be held with permission from your cardiologist or primary care physician. A letter is required to besent to PCP/Cardiologist regarding holding medications for procedure to decrease bleeding risk. Blood Thinners (antiplatelet/) medications that must be held prior to spinal cord stimulator trials, and implants, interlaminar epidurals, caudal epidurals, or transforaminal  epidural injections:     Antiplatelets Aspirin, Effient, Plavix,Ticlid,  Brilinta, Savaysa for 3-5 days  Anticoagulant: Coumadin, Eliquis, Lovenox, Heparin, Pradaxa, Xarelto for 5-7 days.  All antiplatelets/anticoagulants must be held for 7 days for a spinal cord stimulator.                                                                          [name], NP Nurse Practitioner Signed  Progress Notes 05/09/2014 11:27 AM   Related encounter: Office Visit from 05/09/2014 in [facility]      Expand All Collapse All   Subjective:        Patient ID: Katie Juarez is a 74 y.o. female.    HPI Underwent IESI's previously in 11/2013 and 12/2013, no longer effective. Low back pain, pain in buttocks as well as posterior thigh. Worse with sitting.      Allergies    Allergen  Reactions    ???  Pcn [Penicillins]      ???  Zithromax [Azithromycin]             Active Medications    Outpatient Prescriptions Marked as Taking for the 05/09/14 encounter (Office Visit) with [name], NP    Medication  Sig  Dispense  Refill    ???  sulfamethoxazole-trimethoprim (BACTRIM DS) 800-160 MG per tablet  Take 1 tablet by mouth 2 times daily for 7 days  14 tablet  0    ???  vitamin B-12 (CYANOCOBALAMIN) 1000 MCG tablet  Take 1,000 mcg by mouth daily        ???  ibuprofen (ADVIL;MOTRIN) 600 MG tablet  Take 1 tablet by mouth every 8 hours as needed for Pain  120 tablet  3    ???  atorvastatin (LIPITOR) 20 MG tablet  Take 20 mg by mouth daily         ???  NEXIUM 40 MG capsule  Take 40 mg by mouth every morning (before breakfast)         ???  RA LORATADINE 10 MG tablet  Take 10 mg by mouth daily     0    ???  Multiple Vitamins-Iron (MULTI-VITAMIN/IRON) TABS  Take  by mouth Daily.        ???  CALCIUM CARBONATE-VIT D-MIN PO  Take  by mouth 2 times daily.        ???  aspirin 81 MG tablet  Take 81 mg by mouth daily.        ???  citalopram (CELEXA) 10 MG tablet  Take 10 mg by mouth. 1/2 tablet by mouth at bedtime        ???  LORazepam (ATIVAN) 0.5 MG tablet   Take 0.5 mg by mouth. 1 tablet by mouth at bedtime PRN        ???  budesonide-formoterol (SYMBICORT) 160-4.5 MCG/ACT AERO  Inhale 1 puff into the lungs 2 times daily.        ???  polyvinyl alcohol-povidone (HYPOTEARS) 1.4-0.6 % ophthalmic solution  Place 1-2 drops into both eyes as needed.        ???  nitrofurantoin, macrocrystal-monohydrate, (MACROBID) 100 MG capsule  Take 100 mg by mouth 2 times daily.                  Past Medical History    Past Medical History    Diagnosis  Date    ???  Breast cancer ([place])      ???  SS (spinal stenosis)      ???  DDD (degenerative disc disease)      ???  LBP (low back pain)      ???  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  Hysterectomy        ???  Bladder suspension        ???  Cholecystectomy        ???  Breast lumpectomy        ???  Other surgical history    11/14/13, 06/23/11, 06/30/11, 07/12/11 12/29/11        L4/L5 IESI    ???  Other surgical history    12/17/13        L5/S1 IESI           Review of Systems   Constitutional: Positive for fatigue.   Respiratory: Negative for shortness of breath.    Cardiovascular: Positive for leg swelling. Negative for chest pain.   Gastrointestinal: Positive for constipation.   Genitourinary: Negative for difficulty urinating.   Musculoskeletal: Positive for myalgias, back pain, joint swelling, arthralgias and neck pain.   Neurological: Positive for weakness, numbness and headaches.   Psychiatric/Behavioral: Positive for sleep disturbance.        Objective:    Physical Exam   Constitutional: She is oriented to person, place, and time. Vital signs are normal. She appears well-developed and well-nourished. She is cooperative. No distress.   HENT:    Head: Normocephalic and atraumatic.   Cardiovascular: Normal rate and intact distal pulses.    Pulmonary/Chest: Effort normal. No respiratory distress.   Musculoskeletal:        Lumbar back: She exhibits decreased  range of motion (flexion induces pain), tenderness, bony tenderness (bilateral PSIS) and pain.       Back:    TYPE OF EXAM: MRI lumbar spine without contrast  DATE OF EXAM: 12/13/13  COMPARISON: 12/21/10  CLINICAL INDICATION: Degeneration of intervertebral disc of the lumbar region, lumbar spinal stenosis  FINDINGS: 2 mm retrolisthesis of L5 on S1, 2 mm anterolisthesis of L4 on L5, and 2 mm retrolisthesis of L3 on L4,  no pars interarticularis defect evident. Conus at the level of LI and appears unremarkable. Mild degenerative disease within the lumbar   spine. Cystic structures posterior to L4/5 facet joints and adjacent to the spinous process of L5 likely related to synovial cysts, largest is on the right measuring 1.6 cm. Fluid within L4/5 facet joints may be on a degenerative or inflammatory basis. 1  cm cyst lower pole left kidney unchanged.   L5/S1 disc bulge asymmetric to the left. Facet hypertrophic degenerative changes and ligamentum flavum hypertrophy are present. No central canal stenosis. Mild narrowing right neural foramen. Severe narrowing left neural foramen, facet hypertrophic   degenerative changes and disc contact exiting nerve root within the left neural foramen and disc contacts exiting nerve root just lateral to the left neural foramen.  L4/5 disc bulge along with facet hypertrophic degenerative changes. Severe central canal stenosis anterior posterior dimension of the thecal sac measures 6 mm. Mild narrowing bilateral neural foramina.  L3/4 left neural foraminal/lateral disc protrusion superimposed on disc bulge. Facet hypertrophic degenerative changes and ligamentum flavum hypertrophy present. No central canal stenosis. Mild narrowing right neural foramen. Moderate narrowing left   neural foramen, disc contacts exiting nerve root in the distal left neural foramen and just lateral to the left neural foramen.  L2/3 mild disc bulge slightly asymmetric to the left. Facet hypertrophic degenerative  changes and ligamentum flavum hypertrophy present. No central canal stenosis. Right neural foramen patent. Mild narrowing left neural foramen.  L1/2 no disc herniation, central canal stenosis, or neural foramina compromise. Mild facet hypertrophic degenerative changes are present.  IMPRESSION: 1. Degenerative changes result in severe central canal stenosis at L4/5 along with varying degrees of neural foraminal narrowing within the lumbar spine. See above for description of findings at each level. Overall, findings are similar when compared to   prior exam.          Neurological: She is alert and oriented to person, place, and time. She is not disoriented. No cranial nerve deficit.   Negative SLR   Skin: Skin is warm, dry and intact. She is not diaphoretic. No cyanosis. No pallor. Nails show no clubbing.   Psychiatric: She has a normal mood and affect. Her speech is normal.   Vitals reviewed.        Assessment:       1.  Pain of both sacroiliac joints     2.  Piriformis syndrome of left side     3.  Piriformis syndrome, right     4.  Lumbar radicular pain     5.  Neural foraminal stenosis of lumbar spine     6.  Chronic low back pain     7.  Spinal stenosis, lumbar                      Plan:     Schedule bilateral SIJ/piriformis injection. Two weeks later bilateral L5 TFE     Informed consent has been obtained for procedure. Roselyn Reef Dishongis  on blood thinners. Medications to hold have been reviewed with patient.Blood thinners must be held with permission from your cardiologist or primary care physician. A letter is required to besent to PCP/Cardiologist regarding holding medications for procedure to decrease bleeding risk. Blood Thinners (antiplatelet/) medications that must be held prior to spinal cord stimulator trials, and implants, interlaminar epidurals, caudal epidurals, or transforaminal epidural injections:     Antiplatelets Aspirin, Effient, Plavix,Ticlid,  Brilinta, Savaysa for 3-5 days  Anticoagulant:  Coumadin, Eliquis, Lovenox, Heparin, Pradaxa, Xarelto for 5-7 days.  All antiplatelets/anticoagulants must be held for 7 days for a spinal cord stimulator.

## 2014-09-16 NOTE — Other (Signed)
Patient Acct Nbr:  [id number]  Primary AUTH/CERT:    [place] Name:   [place] Name:    Primary Insurance Group Number:    Primary Insurance Plan Type: [facility]  Primary Insurance Policy Number:  [id number]    Secondary AUTH/CERT:    [place] Name:   [facility]  Secondary Insurance Plan Name:  [place]  Secondary Insurance Group Number:  [id number]  Secondary Insurance Plan Type: [employer]  Secondary Insurance Policy Number:  [id number]    Tertiary AUTH/CERT:    [place] Name:   [facility] Insurance Plan Name:  [facility] Insurance Group Number:    [facility] Insurance Plan Type: [employer] Policy Number:  [id number]

## 2014-09-16 NOTE — Op Note (Signed)
MEDIAL BRANCH BLOCK     09/16/14    Surgeon: Willa Rough, MD    Pre-operative Diagnosis:   Patient Active Problem List   Diagnosis Code   ??? SS (spinal stenosis) M48.00   ??? 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 Lumbar facet joint syndrome M48.8X6   ??? 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 Lumbosacral stenosis M48.07   ??? 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 Pain of both sacroiliac joints M53.3   ??? Piriformis syndrome of left side G57.02   ??? Piriformis syndrome G57.00   ??? Lumbar radicular pain M54.16   ??? 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 Chronic low back pain M54.5, G89.29   ??? Spinal stenosis, lumbar M48.06       Post-operative Diagnosis: Same    INDICATION:Please see H&P for details on previous treatments, examination findings, and work up. bilateral L3, L4, L5 medial branch blocks are requested for diagnostic reasons.    Conservative treatment was ineffective i.e.: ice, NSAIDS, rest, narcotic medication, chiropractic care, physical therapy and message therapy.    Patient is unable to perform the following ADL's: ambulating and grooming     Pain Assessment: 0-10  Pain Level: 8     Pain Orientation: Lower  Pain Location: Back  Pain Descriptors: Constant    Last Plain films: 12/15/13    EXAMINATION:?? bilateral L3, L4, L5 medial branch blocks.    CONSENT:?? Written consent was obtained from the patient on preprinted consent form after explaining the procedure, indications, potential complications and outcomes. Alternative treatments were also discussed.    DISCUSSION:?? The patient was sterilely prepped and draped in the usual fashion in the prone position.?? ???Time out??? was verified for correct patient, side, level and procedure.    SEDATION:   No conscious sedation was performed during the procedure.?? The patient remained awake and conversed throughout the procedure.?? The patient underwent pulse oximetry and blood pressure monitoring independently by  a trained observer, as well as by a physician.    PROCEDURE:   Under image-intensifier control, 22 gauge needle x 5 inch spinal needles were directed into the bilateral L3, L4, L5 medial branches of the dorsal rami at the target points, according to ISIS guidelines.?? Needle tip positions were confirmed with 0.2 mL of Omnipaque 240 contrast medium. Then, 74mL of equal volumes of  2% lidocaine  were instilled at each site.     EBL: no blood loss    SPECIMEN: none    The patient tolerated the procedure well and without complications and was noted to be in stable condition prior to discharge from the procedure center with discharge instructions.    IMPRESSIONS:  1.??????????bilateral L4 medial branch blocks performed uneventfully.  2.?????????? bilateralL3, L4, L5 medial branch blocks decreased pain to a 2 on 0 to 10 numeric pain scale at 15 and 30 minutes after the procedure.    RECOMMENDATIONS:  1. Complete and return the "Post-Procedure Pain and Activity Diary.???  2. Contact me for symptom exacerbation, fever or unusual symptoms. 4.?????????? Post-procedure care according to verbal and written instructions at discharge.  3. Follow this block with physical therapy, as per MD directions.  4. Consider confirmatory MBB if there is > 80% pain relief and improved activity scores.    POST-PROCEDURE LUMBAR/CERVICAL SPINE FLUOROSCOPIC IMAGE INTERPRETATION:    EXAMINATION: AP, lateral, and oblique views.    FLUORO TIME: 36 seconds    DISCUSSION:??  Spot views of the spine reveal normal alignment and segmentation. Spinal needles are positioned at the base of the SAP at the junction with the transverse process/sacral ala  OR center of?? the articular pillars on PA and lateral views. Contrast at needle tip confirms satisfactory placement. Visualized spine reveals bilateral Degenerative Joint Disease- L3, L4, L5. Soft tissues reveal no abnormalities.    IMPRESSION:?? Satisfactory needle placement and contrast dispersal for bilateral L3, L4, L5 medial  branch block.     Electronically signed by Willa Rough, MD on 09/16/2014 at 1:54 PM

## 2014-09-16 NOTE — Other (Signed)
Procedure time 6 minutes , xray 36 seconds

## 2014-09-16 NOTE — Interval H&P Note (Signed)
I have interviewed and examined the patient and reviewed the recent History and Physical.  There have been no changes to the recent H&P documentation. The surgical consent form has been signed.    Last anticoagulant medication use was:na    Premedication taken for contrast allergy?No    Valium taken for oral sedation? No    Outpatient Prescriptions Marked as Taking for the 09/16/14 encounter [facility] Encounter) with Willa Rough, MD   Medication Sig Dispense Refill   ??? acetaminophen (TYLENOL) 500 MG tablet Take 1,000 mg by mouth every 6 hours as needed for Pain     ??? clobetasol (TEMOVATE) 0.05 % cream Apply topically twice a week Apply topically 2 times daily.     ??? ibuprofen (ADVIL;MOTRIN) 600 MG tablet Take 1 tablet by mouth every 8 hours as needed for Pain 120 tablet 3   ??? atorvastatin (LIPITOR) 20 MG tablet Take 20 mg by mouth daily      ??? NEXIUM 40 MG capsule Take 40 mg by mouth every morning (before breakfast)      ??? Multiple Vitamins-Iron (MULTI-VITAMIN/IRON) TABS Take  by mouth Daily.     ??? CALCIUM CARBONATE-VIT D-MIN PO Take  by mouth 2 times daily.     ??? aspirin 81 MG tablet Take 81 mg by mouth daily.     ??? citalopram (CELEXA) 10 MG tablet Take 10 mg by mouth nightly      ??? LORazepam (ATIVAN) 0.5 MG tablet Take 0.5 mg by mouth. 1 tablet by mouth at bedtime PRN     ??? budesonide-formoterol (SYMBICORT) 160-4.5 MCG/ACT AERO Inhale 1 puff into the lungs 2 times daily.     ??? polyvinyl alcohol-povidone (HYPOTEARS) 1.4-0.6 % ophthalmic solution Place 1-2 drops into both eyes as needed.         The patient understands the planned operation and its associated risks and benefits and agrees to proceed.        Electronically signed by Willa Rough, MD on 09/16/2014 at 1:52 PM

## 2014-09-18 NOTE — Telephone Encounter (Signed)
Patient had a Bilateral L3,L4,L5 Diagnostic MBB on 09/12/14 and did not feel that it was effective for her pain.

## 2014-09-18 NOTE — Telephone Encounter (Signed)
Patient called and stated that she could not tell that she had anything done for pain. She stated that yesterday was "uncomfortable", but thought that it could have been from being in the car for a total of 6 hrs. She stated that today she has pain that she took OTC Tylenol.   [phone]

## 2014-09-18 NOTE — Telephone Encounter (Signed)
We can discuss further options at OV

## 2014-09-18 NOTE — Telephone Encounter (Signed)
Patient is scheduled for 09/24/14.

## 2014-09-24 ENCOUNTER — Ambulatory Visit: Admit: 2014-09-24 | Discharge: 2014-09-24 | Payer: MEDICARE | Attending: Nurse Practitioner | Primary: Family Medicine

## 2014-09-24 DIAGNOSIS — M5416 Radiculopathy, lumbar region: Secondary | ICD-10-CM

## 2014-09-24 NOTE — Patient Instructions (Signed)
[facility]  [address], [place], [place] [address]  Telephone [phone]  Fax [phone]      PROCEDURE INSTRUCTIONS FOR  PAIN MANAGEMENT PROCEDURES WITHOUT IV SEDATION    Katie Juarez scheduled to see Dr. Marcello Moores to undergo the following procedure: Bilateral L5 TFE    Procedure Date: ____09/16/2016____  Arrival Time: ____7:30AM____     Report to the [place], Registration office on the 1st floor in the hospital, after check in and signing of paper work you will then go to the second floor to the surgery center.    1. Stop the following medications prior to the procedure:  Date__9/12/16__ : Stop blood thinners as directed before the injection, with permission from your cardiologist or primary care physician. We will send a letter to them requesting permission to hold the blood thinners.     ?? Medication___Aspirin___ held for __3__ days    If you take Warfarin (Coumadin), you must have your blood drawn for an INR the day before the procedure. INR must be less than 1.5.    2.  Take all routine medications unless otherwise instructed. Ok to take vitamins and antiinflammatory medications    3.  EATING & DRINKING:  Ok to eat or drink before the injection - no IV sedation will be used.     4. If you are allergic to contrast or iodine, you must take benadryl and prednisone prior to the injection to prevent an allergic reaction. Follow the directions on the prescription for the times to take the medication.    5. Oral valium can be prescribed if your are anxious about the injections or feel that you can not lay still during the injection. If you take valium, you must have a driver. Make arrangements for a family member or friend to drive you to the surgery center.  Your ride must stay in the hospital while you are having the injection done. If they cannot stay, the injection will be rescheduled. The valium may affect your judgment following the procedure and driving a vehicle  within 24 hours after the sedation could be dangerous.    6.  Wear simple loose clothing, which can be easily changed.      7.  Leave jewelry (including rings) and other valuables at home.     8.  You will be asked to sign several forms prior to surgery; patients under the age of 46 must have a parent or legal guardian sign the permit to be able to do the procedure.      9.  You must have finished any antibiotic prescribed for recent infections. If required, please take pre-procedure antibiotic or other pre-procedure medications as instructed.      10. Bring inhalers and pain medications with you to your procedure.      11. Bring your MRI/CT films if they were done outside of the [facility].    12. If you should develop a cold, sore throat, cough, fever or other new indication of illness or infection, or are started on antibiotics within 2 weeks of the scheduled procedure, please notify the Doctor???s office as early as possible at [phone].            [facility]  [address] [place], [place] [address]  Telephone [phone]  Fax [phone]      PROCEDURE INSTRUCTIONS FOR  PAIN MANAGEMENT PROCEDURES WITHOUT IV SEDATION    Katie Juarez scheduled to see  Dr. Marcello Moores to undergo the following procedure: Bilateral L5 TFE    Procedure Date: ____9/30/2016____  Arrival Time: ____8:00AM____     Report to the [place], Registration office on the 1st floor in the hospital, after check in and signing of paper work you will then go to the second floor to the surgery center.    1. Stop the following medications prior to the procedure:  Date__9/26/16__ : Stop blood thinners as directed before the injection, with permission from your cardiologist or primary care physician. We will send a letter to them requesting permission to hold the blood thinners.     ?? Medication___Aspirin___ held for __3__ days    If you take Warfarin (Coumadin), you must have your blood drawn for an INR the  day before the procedure. INR must be less than 1.5.    2.  Take all routine medications unless otherwise instructed. Ok to take vitamins and antiinflammatory medications    3.  EATING & DRINKING:  Ok to eat or drink before the injection - no IV sedation will be used.     4. If you are allergic to contrast or iodine, you must take benadryl and prednisone prior to the injection to prevent an allergic reaction. Follow the directions on the prescription for the times to take the medication.    5. Oral valium can be prescribed if your are anxious about the injections or feel that you can not lay still during the injection. If you take valium, you must have a driver. Make arrangements for a family member or friend to drive you to the surgery center.  Your ride must stay in the hospital while you are having the injection done. If they cannot stay, the injection will be rescheduled. The valium may affect your judgment following the procedure and driving a vehicle within 24 hours after the sedation could be dangerous.    6.  Wear simple loose clothing, which can be easily changed.      7.  Leave jewelry (including rings) and other valuables at home.     8.  You will be asked to sign several forms prior to surgery; patients under the age of 39 must have a parent or legal guardian sign the permit to be able to do the procedure.      9.  You must have finished any antibiotic prescribed for recent infections. If required, please take pre-procedure antibiotic or other pre-procedure medications as instructed.      10. Bring inhalers and pain medications with you to your procedure.      11. Bring your MRI/CT films if they were done outside of the [facility].    12. If you should develop a cold, sore throat, cough, fever or other new indication of illness or infection, or are started on antibiotics within 2 weeks of the scheduled procedure, please notify the Doctor???s office as early as possible at [phone].

## 2014-09-24 NOTE — Progress Notes (Signed)
Subjective:      Patient ID: Katie Juarez is a 74 y.o. female.    HPI Post diagnostic MBB, no relief. Feels it made her pain worse. When she first started injections, L4/L5 IESI effective. At that time, she was not have radicular pain    Pain Assessment  Location of Pain: Leg  Location Modifiers: Left, Right  Severity of Pain: 7  Quality of Pain: Aching  Duration of Pain: Persistent  Frequency of Pain: Constant  Aggravating Factors: Other (Comment), Standing, Walking (shoots from buttocks into legs )  Limiting Behavior: Some  Relieving Factors: Rest, Ice, Heat  Are there other pain locations you wish to document?: Yes    Allergies   Allergen Reactions   ??? Pcn [Penicillins] Hives   ??? Zithromax [Azithromycin] Hives       Outpatient Prescriptions Marked as Taking for the 09/24/14 encounter (Office Visit) with [name], NP   Medication Sig Dispense Refill   ??? acetaminophen (TYLENOL) 500 MG tablet Take 1,000 mg by mouth every 6 hours as needed for Pain     ??? clobetasol (TEMOVATE) 0.05 % cream Apply topically twice a week Apply topically 2 times daily.     ??? ibuprofen (ADVIL;MOTRIN) 600 MG tablet Take 1 tablet by mouth every 8 hours as needed for Pain 120 tablet 3   ??? atorvastatin (LIPITOR) 20 MG tablet Take 20 mg by mouth daily      ??? NEXIUM 40 MG capsule Take 40 mg by mouth every morning (before breakfast)      ??? RA LORATADINE 10 MG tablet Take 10 mg by mouth daily   0   ??? Multiple Vitamins-Iron (MULTI-VITAMIN/IRON) TABS Take  by mouth Daily.     ??? CALCIUM CARBONATE-VIT D-MIN PO Take  by mouth 2 times daily.     ??? aspirin 81 MG tablet Take 81 mg by mouth daily.     ??? citalopram (CELEXA) 10 MG tablet Take 10 mg by mouth nightly      ??? LORazepam (ATIVAN) 0.5 MG tablet Take 0.5 mg by mouth. 1 tablet by mouth at bedtime PRN     ??? budesonide-formoterol (SYMBICORT) 160-4.5 MCG/ACT AERO Inhale 1 puff into the lungs 2 times daily.     ??? polyvinyl alcohol-povidone (HYPOTEARS) 1.4-0.6 % ophthalmic solution Place 1-2  drops into both eyes as needed.         Past Medical History   Diagnosis Date   ??? Breast cancer ([place])    ??? DDD (degenerative disc disease)    ??? LBP (low back pain)    ??? 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 SS (spinal stenosis)        Past Surgical History   Procedure Laterality Date   ??? Hysterectomy     ??? Bladder suspension     ??? Cholecystectomy     ??? Breast lumpectomy     ??? Other surgical history  11/14/13, 06/23/11, 06/30/11, 07/12/11 12/29/11     L4/L5 IESI   ??? Other surgical history  12/17/13     L5/S1 IESI   ??? Other surgical history Bilateral 05/30/14     SIJ and piriformis   ??? Other surgical history Bilateral 09/16/2014     L3, L4, L5 Diagnostic Medial Branch Block     Review of Systems   Constitutional: Positive for fatigue.   Respiratory: Negative for shortness of breath.    Cardiovascular: Positive for leg swelling. Negative for chest pain.  Gastrointestinal: Positive for constipation.   Genitourinary: Negative for difficulty urinating.   Musculoskeletal: Positive for arthralgias, back pain, joint swelling, myalgias and neck pain.   Neurological: Positive for weakness, numbness and headaches.   Psychiatric/Behavioral: Positive for sleep disturbance.       Objective:   Physical Exam   Constitutional: She is oriented to person, place, and time. Vital signs are normal. She appears well-developed and well-nourished. She is cooperative. No distress.   HENT:   Head: Normocephalic and atraumatic.   Cardiovascular: Normal rate and intact distal pulses.    Pulmonary/Chest: Effort normal. No respiratory distress.   Musculoskeletal:        Lumbar back: She exhibits decreased range of motion, tenderness and pain.   Neurological: She is alert and oriented to person, place, and time. She is not disoriented. No cranial nerve deficit.   Skin: Skin is warm, dry and intact. She is not diaphoretic. No cyanosis. No pallor. Nails show no clubbing.   Psychiatric: She has a normal mood and affect. Her speech is  normal.   Vitals reviewed.      Assessment:      1. Lumbar radicular pain    2. Neural foraminal stenosis of lumbar spine    3. Chronic bilateral low back pain with bilateral sciatica          Plan:    Schedule bilateral L5 TFE x2    Informed consent has been obtained for procedure. Roselyn Reef Dishongis  on blood thinners. Medications to hold have been reviewed with patient.Blood thinners must be held with permission from your cardiologist or primary care physician. A letter is  required to besent to PCP/Cardiologist regarding holding medications for procedure to decrease bleeding risk. Blood Thinners (antiplatelet/) medications that must be held prior to spinal cord stimulator trials, and implants, interlaminar epidurals, caudal epidurals, or transforaminal epidural injections:    Antiplatelets Aspirin, Effient, Plavix,Ticlid,  Brilinta, Savaysa for 3-5 days  Anticoagulant: Coumadin, Eliquis, Lovenox, Heparin, Pradaxa, Xarelto for 5-7 days.  All antiplatelets/anticoagulants must be held for 7 days for a spinal cord stimulator.

## 2014-09-26 ENCOUNTER — Encounter: Admit: 2014-09-26 | Attending: Physical Medicine & Rehabilitation | Primary: Family Medicine

## 2014-09-26 ENCOUNTER — Inpatient Hospital Stay: Payer: MEDICARE | Attending: Physical Medicine & Rehabilitation | Primary: Family Medicine

## 2014-09-26 ENCOUNTER — Encounter: Admit: 2014-09-26 | Primary: Family Medicine

## 2014-09-26 DIAGNOSIS — M5416 Radiculopathy, lumbar region: Secondary | ICD-10-CM

## 2014-09-26 MED ORDER — BUFFERED LIDOCAINE 1%
Status: AC
Start: 2014-09-26 — End: ?

## 2014-09-26 MED ORDER — NORMAL SALINE FLUSH 0.9 % IV SOLN
0.9 % | Freq: Two times a day (BID) | INTRAVENOUS | Status: DC
Start: 2014-09-26 — End: 2014-09-26

## 2014-09-26 MED ORDER — NORMAL SALINE FLUSH 0.9 % IV SOLN
0.9 % | INTRAVENOUS | Status: DC | PRN
Start: 2014-09-26 — End: 2014-09-26

## 2014-09-26 MED ORDER — DEXAMETHASONE SOD PHOSPHATE PF 10 MG/ML IJ SOLN
10 MG/ML | INTRAMUSCULAR | Status: AC
Start: 2014-09-26 — End: ?

## 2014-09-26 MED ORDER — BUPIVACAINE HCL (PF) 0.5 % IJ SOLN
0.5 % | INTRAMUSCULAR | Status: AC
Start: 2014-09-26 — End: ?

## 2014-09-26 MED FILL — BUFFERED LIDOCAINE 1%: Qty: 20

## 2014-09-26 MED FILL — DEXAMETHASONE SOD PHOSPHATE PF 10 MG/ML IJ SOLN: 10 MG/ML | INTRAMUSCULAR | Qty: 2

## 2014-09-26 MED FILL — BUPIVACAINE HCL (PF) 0.5 % IJ SOLN: 0.5 % | INTRAMUSCULAR | Qty: 30

## 2014-09-26 NOTE — Other (Signed)
Patient Acct Nbr:  [id number]  Primary AUTH/CERT:    [place] Name:   [place]  Primary Insurance Plan Name:    Primary Insurance Group Number:    Primary Insurance Plan Type: [facility]  Primary Insurance Policy Number:  [id number]    Secondary AUTH/CERT:    [place] Name:   [employer] [place]  Secondary Insurance Plan Name:  [place]  Secondary Insurance Group Number:  [id number]  Secondary Insurance Plan Type: [employer]  Secondary Insurance Policy Number:  [id number]    Tertiary AUTH/CERT:    [employer] Name:   [facility] Insurance Plan Name:  [facility] Insurance Group Number:    [facility] Insurance Plan Type: [employer] Policy Number:  [id number]

## 2014-09-26 NOTE — Interval H&P Note (Signed)
I have interviewed and examined the patient and reviewed the recent History and Physical.  There have been no changes to the recent H&P documentation. The surgical consent form has been signed.    Last anticoagulant medication use was:na    Premedication taken for contrast allergy?No    Valium taken for oral sedation? No    Outpatient Prescriptions Marked as Taking for the 09/26/14 encounter [facility] Encounter) with Willa Rough, MD   Medication Sig Dispense Refill   ??? acetaminophen (TYLENOL) 500 MG tablet Take 1,000 mg by mouth every 6 hours as needed for Pain     ??? clobetasol (TEMOVATE) 0.05 % cream Apply topically twice a week Apply topically 2 times daily.     ??? ibuprofen (ADVIL;MOTRIN) 600 MG tablet Take 1 tablet by mouth every 8 hours as needed for Pain 120 tablet 3   ??? NEXIUM 40 MG capsule Take 40 mg by mouth every morning (before breakfast)      ??? Multiple Vitamins-Iron (MULTI-VITAMIN/IRON) TABS Take  by mouth Daily.     ??? CALCIUM CARBONATE-VIT D-MIN PO Take  by mouth 2 times daily.     ??? aspirin 81 MG tablet Take 81 mg by mouth daily.     ??? citalopram (CELEXA) 10 MG tablet Take 10 mg by mouth nightly      ??? LORazepam (ATIVAN) 0.5 MG tablet Take 0.5 mg by mouth. 1 tablet by mouth at bedtime PRN     ??? budesonide-formoterol (SYMBICORT) 160-4.5 MCG/ACT AERO Inhale 1 puff into the lungs 2 times daily.     ??? polyvinyl alcohol-povidone (HYPOTEARS) 1.4-0.6 % ophthalmic solution Place 1-2 drops into both eyes as needed.         The patient understands the planned operation and its associated risks and benefits and agrees to proceed.        Electronically signed by Willa Rough, MD on 09/26/2014 at 8:30 AM

## 2014-09-26 NOTE — H&P (Signed)
[name], NP Nurse Practitioner Signed  Progress Notes Date of Service: 09/24/2014  9:23 AM   Related encounter: Office Visit from 09/24/2014 in [facility] Pain Management      Expand All Collapse All    Hide copied text  Hover for attribution information  Subjective:        Patient ID: Katie Juarez is a 74 y.o. female.     HPI Post diagnostic MBB, no relief. Feels it made her pain worse. When she first started injections, L4/L5 IESI effective. At that time, she was not have radicular pain     Pain Assessment  Location of Pain: Leg  Location Modifiers: Left, Right  Severity of Pain: 7  Quality of Pain: Aching  Duration of Pain: Persistent  Frequency of Pain: Constant  Aggravating Factors: Other (Comment), Standing, Walking (shoots from buttocks into legs )  Limiting Behavior: Some  Relieving Factors: Rest, Ice, Heat  Are there other pain locations you wish to document?: Yes          Allergies    Allergen  Reactions    ???  Pcn [Penicillins]  Hives    ???  Zithromax [Azithromycin]  Hives           Active Medications           Outpatient Prescriptions Marked as Taking for the 09/24/14 encounter (Office Visit) with [name], NP    Medication  Sig  Dispense  Refill    ???  acetaminophen (TYLENOL) 500 MG tablet  Take 1,000 mg by mouth every 6 hours as needed for Pain        ???  clobetasol (TEMOVATE) 0.05 % cream  Apply topically twice a week Apply topically 2 times daily.        ???  ibuprofen (ADVIL;MOTRIN) 600 MG tablet  Take 1 tablet by mouth every 8 hours as needed for Pain  120 tablet  3    ???  atorvastatin (LIPITOR) 20 MG tablet  Take 20 mg by mouth daily         ???  NEXIUM 40 MG capsule  Take 40 mg by mouth every morning (before breakfast)         ???  RA LORATADINE 10 MG tablet  Take 10 mg by mouth daily     0    ???  Multiple Vitamins-Iron (MULTI-VITAMIN/IRON) TABS  Take by mouth Daily.        ???  CALCIUM CARBONATE-VIT D-MIN PO  Take by mouth 2 times daily.        ???  aspirin 81 MG tablet  Take 81 mg by mouth daily.         ???  citalopram (CELEXA) 10 MG tablet  Take 10 mg by mouth nightly         ???  LORazepam (ATIVAN) 0.5 MG tablet  Take 0.5 mg by mouth. 1 tablet by mouth at bedtime PRN        ???  budesonide-formoterol (SYMBICORT) 160-4.5 MCG/ACT AERO  Inhale 1 puff into the lungs 2 times daily.        ???  polyvinyl alcohol-povidone (HYPOTEARS) 1.4-0.6 % ophthalmic solution  Place 1-2 drops into both eyes as needed.                  Past Medical History         Past Medical History    Diagnosis  Date    ???  Breast cancer ([place])      ???  DDD (degenerative disc disease)      ???  LBP (low back pain)      ???  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  SS (spinal stenosis)                Past Surgical History           Past Surgical History    Procedure  Laterality  Date    ???  Hysterectomy        ???  Bladder suspension        ???  Cholecystectomy        ???  Breast lumpectomy        ???  Other surgical history    11/14/13, 06/23/11, 06/30/11, 07/12/11 12/29/11        L4/L5 IESI    ???  Other surgical history    12/17/13        L5/S1 IESI    ???  Other surgical history  Bilateral  05/30/14        SIJ and piriformis    ???  Other surgical history  Bilateral  09/16/2014        L3, L4, L5 Diagnostic Medial Branch Block          Review of Systems   Constitutional: Positive for fatigue.   Respiratory: Negative for shortness of breath.   Cardiovascular: Positive for leg swelling. Negative for chest pain.   Gastrointestinal: Positive for constipation.   Genitourinary: Negative for difficulty urinating.   Musculoskeletal: Positive for arthralgias, back pain, joint swelling, myalgias and neck pain.   Neurological: Positive for weakness, numbness and headaches.   Psychiatric/Behavioral: Positive for sleep disturbance.         Objective:    Physical Exam   Constitutional: She is oriented to person, place, and time. Vital signs are normal. She appears well-developed and well-nourished. She is cooperative. No distress.   HENT:    Head: Normocephalic and  atraumatic.   Cardiovascular: Normal rate and intact distal pulses.   Pulmonary/Chest: Effort normal. No respiratory distress.   Musculoskeletal:   Lumbar back: She exhibits decreased range of motion, tenderness and pain.   Neurological: She is alert and oriented to person, place, and time. She is not disoriented. No cranial nerve deficit.   Skin: Skin is warm, dry and intact. She is not diaphoretic. No cyanosis. No pallor. Nails show no clubbing.   Psychiatric: She has a normal mood and affect. Her speech is normal.   Vitals reviewed.        Assessment:       1.  Lumbar radicular pain     2.  Neural foraminal stenosis of lumbar spine     3.  Chronic bilateral low back pain with bilateral sciatica            Plan:     Schedule bilateral L5 TFE x2     Informed consent has been obtained for procedure. Roselyn Reef Dishongis  on blood thinners. Medications to hold have been reviewed with patient.Blood thinners must be held with permission from your cardiologist or primary care physician. A letter is  required to besent to PCP/Cardiologist regarding holding medications for procedure to decrease bleeding risk. Blood Thinners (antiplatelet/) medications that must be held prior to spinal cord stimulator trials, and implants, interlaminar epidurals, caudal epidurals, or transforaminal epidural injections:    Antiplatelets Aspirin, Effient, Plavix,Ticlid, Brilinta, Savaysa for 3-5 days  Anticoagulant: Coumadin, Eliquis, Lovenox,  Heparin, Pradaxa, Xarelto for 5-7 days.  All antiplatelets/anticoagulants must be held for 7 days for a spinal cord stimulator.                                Willa Rough, MD Physician Signed  Progress Notes Date of Service: 09/10/2014  8:18 AM   Related encounter: Office Visit from 09/10/2014 in [facility] Pain Management      Expand All Collapse All    Hide copied text  Hover for attribution information  PAIN MANAGEMENT FOLLOW-UP NOTE  09/10/14     CHIEF COMPLAINT: This is a 74 y.o. female patient who returns  to the Pain Management Clinic with a history of No chief complaint on file.        PAIN HPI: YORLEY BUCH returns today for reevaluation. Since the visit, the patient reports that the pain is not changed. Seems that S-I and ESIS are now not effective for B Gluteal and Posterior upper thigh pain     ANALGESIA:   Are your Current Pain medication (s) helping to decrease pain? No.    Current Pain score:       ADVERSE AFFECTS:   Medication Side Effects: No.     ACTIVITY:  Are you able to be more active with your pain medications? No        ABERRANT BEHAVIORS SINCE LAST VISIT? No     Review of Systems   Constitutional: Positive for fatigue.   HENT: Negative.   Eyes: Negative.   Respiratory: Negative.   Cardiovascular: Negative.   Gastrointestinal: Negative for constipation and diarrhea.   Endocrine: Negative.   Genitourinary: Negative for difficulty urinating and flank pain.   Musculoskeletal: Positive for back pain and myalgias.   Skin: Negative.   Allergic/Immunologic: Negative.   Neurological: Positive for weakness and numbness.   Hematological: Negative.   Psychiatric/Behavioral: Positive for sleep disturbance.              Allergies    Allergen  Reactions    ???  Pcn [Penicillins]  Hives    ???  Zithromax [Azithromycin]  Hives           Medications Prior to Visit           Outpatient Medications Prior to Visit    Medication  Sig  Dispense  Refill    ???  ibuprofen (ADVIL;MOTRIN) 600 MG tablet  Take 1 tablet by mouth every 8 hours as needed for Pain  120 tablet  3    ???  atorvastatin (LIPITOR) 20 MG tablet  Take 20 mg by mouth daily         ???  NEXIUM 40 MG capsule  Take 40 mg by mouth every morning (before breakfast)         ???  RA LORATADINE 10 MG tablet  Take 10 mg by mouth daily     0    ???  Multiple Vitamins-Iron (MULTI-VITAMIN/IRON) TABS  Take by mouth Daily.        ???  CALCIUM CARBONATE-VIT D-MIN PO  Take by mouth 2 times daily.        ???  aspirin 81 MG tablet  Take 81 mg by mouth daily.        ???  citalopram (CELEXA)  10 MG tablet  Take 10 mg by mouth nightly         ???  LORazepam (ATIVAN) 0.5 MG  tablet  Take 0.5 mg by mouth. 1 tablet by mouth at bedtime PRN        ???  budesonide-formoterol (SYMBICORT) 160-4.5 MCG/ACT AERO  Inhale 1 puff into the lungs 2 times daily.        ???  polyvinyl alcohol-povidone (HYPOTEARS) 1.4-0.6 % ophthalmic solution  Place 1-2 drops into both eyes as needed.        ???  vitamin B-12 (CYANOCOBALAMIN) 1000 MCG tablet  Take 1,000 mcg by mouth daily           No facility-administered medications prior to visit.               Past Medical History         Past Medical History    Diagnosis  Date    ???  Breast cancer ([place])      ???  DDD (degenerative disc disease)      ???  LBP (low back pain)      ???  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  SS (spinal stenosis)                Past Surgical History           Past Surgical History    Procedure  Laterality  Date    ???  Hysterectomy        ???  Bladder suspension        ???  Cholecystectomy        ???  Breast lumpectomy        ???  Other surgical history    11/14/13, 06/23/11, 06/30/11, 07/12/11 12/29/11        L4/L5 IESI    ???  Other surgical history    12/17/13        L5/S1 IESI    ???  Other surgical history  Bilateral  05/30/14        SIJ and piriformis             Family and Social History reviewed in the electronic medical record.     Imaging: Reviewed available imaging in our system with the patient.  No results found.     Objective:       Physical Exam:   Vitals        Vitals:      09/10/14 0812    Weight:  153 lb 9.6 oz (69.7 kg)                Physical Exam   Constitutional: She is oriented to person, place, and time. She appears well-developed and well-nourished. No distress.   HENT:    Head: Normocephalic and atraumatic.   Right Ear: External ear normal. No decreased hearing is noted.   Left Ear: External ear normal. No decreased hearing is noted.    Nose: Nose normal.    Mouth/Throat: Oropharynx is clear and moist and mucous membranes are normal.   Eyes:  Conjunctivae and lids are normal. No scleral icterus.   Neck: Phonation normal.   Cardiovascular:   No BLE edema present   Pulmonary/Chest: Effort normal. No accessory muscle usage. No respiratory distress.   Abdominal: Normal appearance.   Genitourinary:   Genitourinary Comments: deferred   Neurological: She is alert and oriented to person, place, and time.   Skin: Skin is warm, dry and intact. No rash noted. She is not diaphoretic. No cyanosis or erythema. No pallor.   Psychiatric: She has a  normal mood and affect. Her speech is normal and behavior is normal.      Back Exam      Comments: Mild Kemps B L3,4,5  Slump +  Fabers +            Assessment:    This is a 74 y.o. female patient with:     Diagnosis:    No diagnosis found.     Medical Comorbidities:  As listed in the patient's past medical and surgical history     Functional Limitations:    Pain limits function and quality of life.      Plan:       B L3,4,5 MBB Diagnostic  Meds:    Controlled Substances Monitoring: Pt educated about the risks of taking opiates, including increased sedation, constipation, slowed breathing, tolerance, dependence, and addiction.           New Prescriptions      No medications on file           Encounter Medications     No orders of the defined types were placed in this encounter.      No orders of the defined types were placed in this encounter.        No Follow-up on file.     The patient expressed understanding of the above assessment and plan.     Total time spent face to face with patient was 20 minutes in which 50% or more of the time was spent in counseling, education about risk and benefits of the above plan, and coordination of care.                      Willa Rough, MD Physician Signed General Surgery H&P 05/30/2014  8:03 AM   Related encounter: Admission (Discharged) from OP Visit from 05/30/2014 in [facility], NP  Nurse Practitioner  Signed    Progress Notes   05/09/2014 11:27 AM    Related encounter: Office Visit from 05/09/2014 in [facility]        Expand All Collapse All   Subjective:         Patient ID: Katie Juarez is a 74 y.o. female.    HPI Underwent IESI's previously in 11/2013 and 12/2013, no longer effective. Low back pain, pain in buttocks as well as posterior thigh. Worse with sitting.      Allergies     Allergen   Reactions     ???   Pcn [Penicillins]        ???   Zithromax [Azithromycin]                Active Medications      Outpatient Prescriptions Marked as Taking for the 05/09/14 encounter (Office Visit) with [name], NP     Medication   Sig   Dispense   Refill     ???   sulfamethoxazole-trimethoprim (BACTRIM DS) 800-160 MG per tablet   Take 1 tablet by mouth 2 times daily for 7 days   14 tablet   0     ???   vitamin B-12 (CYANOCOBALAMIN) 1000 MCG tablet   Take 1,000 mcg by mouth daily           ???   ibuprofen (ADVIL;MOTRIN) 600 MG tablet   Take 1 tablet by mouth every 8 hours as needed for Pain  120 tablet   3     ???   atorvastatin (LIPITOR) 20 MG tablet   Take 20 mg by mouth daily            ???   NEXIUM 40 MG capsule   Take 40 mg by mouth every morning (before breakfast)            ???   RA LORATADINE 10 MG tablet   Take 10 mg by mouth daily       0     ???   Multiple Vitamins-Iron (MULTI-VITAMIN/IRON) TABS   Take  by mouth Daily.           ???   CALCIUM CARBONATE-VIT D-MIN PO   Take  by mouth 2 times daily.           ???   aspirin 81 MG tablet   Take 81 mg by mouth daily.           ???   citalopram (CELEXA) 10 MG tablet   Take 10 mg by mouth. 1/2 tablet by mouth at bedtime           ???   LORazepam (ATIVAN) 0.5 MG tablet   Take 0.5 mg by mouth. 1 tablet by mouth at bedtime PRN           ???   budesonide-formoterol (SYMBICORT) 160-4.5 MCG/ACT AERO   Inhale 1 puff into the lungs 2 times daily.           ???   polyvinyl alcohol-povidone (HYPOTEARS) 1.4-0.6 % ophthalmic solution   Place 1-2 drops into both eyes as needed.           ???   nitrofurantoin,  macrocrystal-monohydrate, (MACROBID) 100 MG capsule   Take 100 mg by mouth 2 times daily.                       Past Medical History      Past Medical History     Diagnosis   Date     ???   Breast cancer ([place])        ???   SS (spinal stenosis)        ???   DDD (degenerative disc disease)        ???   LBP (low back pain)        ???   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   Hysterectomy           ???   Bladder suspension           ???   Cholecystectomy           ???   Breast lumpectomy           ???   Other surgical history      11/14/13, 06/23/11, 06/30/11, 07/12/11 12/29/11           L4/L5 IESI     ???   Other surgical history      12/17/13           L5/S1 IESI             Review of Systems    Constitutional: Positive for fatigue.   Respiratory:  Negative for shortness of breath.     Cardiovascular: Positive for leg swelling. Negative for chest pain.    Gastrointestinal: Positive for constipation.   Genitourinary: Negative for difficulty urinating.    Musculoskeletal: Positive for myalgias, back pain, joint swelling, arthralgias and neck pain.   Neurological: Positive for weakness, numbness and headaches.   Psychiatric/Behavioral: Positive for sleep disturbance.        Objective:     Physical Exam   Constitutional: She is oriented to person, place, and time. Vital signs are normal. She appears well-developed and well-nourished. She is cooperative. No distress.    HENT:    Head: Normocephalic and atraumatic.    Cardiovascular: Normal rate and intact distal pulses.     Pulmonary/Chest: Effort normal. No respiratory distress.   Musculoskeletal:        Lumbar back: She exhibits decreased range of motion (flexion induces pain), tenderness, bony tenderness (bilateral PSIS) and pain.       Back:    TYPE OF EXAM: MRI lumbar spine without contrast  DATE OF EXAM: 12/13/13  COMPARISON: 12/21/10  CLINICAL INDICATION: Degeneration of  intervertebral disc of the lumbar region, lumbar spinal stenosis  FINDINGS: 2 mm retrolisthesis of L5 on S1, 2 mm anterolisthesis of L4 on L5, and 2 mm retrolisthesis of L3 on L4, no pars interarticularis defect evident. Conus at the level of LI and appears unremarkable. Mild degenerative disease within the lumbar   spine. Cystic structures posterior to L4/5 facet joints and adjacent to the spinous process of L5 likely related to synovial cysts, largest is on the right measuring 1.6 cm. Fluid within L4/5 facet joints may be on a degenerative or inflammatory basis. 1  cm cyst lower pole left kidney unchanged.   L5/S1 disc bulge asymmetric to the left. Facet hypertrophic degenerative changes and ligamentum flavum hypertrophy are present. No central canal stenosis. Mild narrowing right neural foramen. Severe narrowing left neural foramen, facet hypertrophic   degenerative changes and disc contact exiting nerve root within the left neural foramen and disc contacts exiting nerve root just lateral to the left neural foramen.  L4/5 disc bulge along with facet hypertrophic degenerative changes. Severe central canal stenosis anterior posterior dimension of the thecal sac measures 6 mm. Mild narrowing bilateral neural foramina.  L3/4 left neural foraminal/lateral disc protrusion superimposed on disc bulge. Facet hypertrophic degenerative changes and ligamentum flavum hypertrophy present. No central canal stenosis. Mild narrowing right neural foramen. Moderate narrowing left   neural foramen, disc contacts exiting nerve root in the distal left neural foramen and just lateral to the left neural foramen.  L2/3 mild disc bulge slightly asymmetric to the left. Facet hypertrophic degenerative changes and ligamentum flavum hypertrophy present. No central canal stenosis. Right neural foramen patent. Mild narrowing left neural foramen.  L1/2 no disc herniation, central canal stenosis, or neural foramina compromise. Mild facet  hypertrophic degenerative changes are present.  IMPRESSION: 1. Degenerative changes result in severe central canal stenosis at L4/5 along with varying degrees of neural foraminal narrowing within the lumbar spine. See above for description of findings at each level. Overall, findings are similar when compared to   prior exam.          Neurological: She is alert and oriented to person, place, and time. She is not disoriented. No cranial nerve deficit.   Negative SLR    Skin: Skin is warm, dry and intact. She is not diaphoretic. No cyanosis. No pallor. Nails show no clubbing.   Psychiatric:  She has a normal mood and affect. Her speech is normal.    Vitals reviewed.        Assessment:        1.   Pain of both sacroiliac joints      2.   Piriformis syndrome of left side      3.   Piriformis syndrome, right      4.   Lumbar radicular pain      5.   Neural foraminal stenosis of lumbar spine      6.   Chronic low back pain      7.   Spinal stenosis, lumbar                        Plan:      Schedule bilateral SIJ/piriformis injection. Two weeks later bilateral L5 TFE     Informed consent has been obtained for procedure. Roselyn Reef Dishongis  on blood thinners. Medications to hold have been reviewed with patient.Blood thinners must be held with permission from your cardiologist or primary care physician. A letter is required to besent to PCP/Cardiologist regarding holding medications for procedure to decrease bleeding risk. Blood Thinners (antiplatelet/) medications that must be held prior to spinal cord stimulator trials, and implants, interlaminar epidurals, caudal epidurals, or transforaminal epidural injections:     Antiplatelets Aspirin, Effient, Plavix,Ticlid,  Brilinta, Savaysa for 3-5 days  Anticoagulant: Coumadin, Eliquis, Lovenox, Heparin, Pradaxa, Xarelto for 5-7 days.  All antiplatelets/anticoagulants must be held for 7 days for a spinal cord stimulator.                                                                           [name], NP Nurse Practitioner Signed  Progress Notes 05/09/2014 11:27 AM   Related encounter: Office Visit from 05/09/2014 in [facility] Pain Management      Expand All Collapse All   Subjective:        Patient ID: SAYRE MAZOR is a 74 y.o. female.    HPI Underwent IESI's previously in 11/2013 and 12/2013, no longer effective. Low back pain, pain in buttocks as well as posterior thigh. Worse with sitting.      Allergies    Allergen  Reactions    ???  Pcn [Penicillins]      ???  Zithromax [Azithromycin]             Active Medications    Outpatient Prescriptions Marked as Taking for the 05/09/14 encounter (Office Visit) with [name], NP    Medication  Sig  Dispense  Refill    ???  sulfamethoxazole-trimethoprim (BACTRIM DS) 800-160 MG per tablet  Take 1 tablet by mouth 2 times daily for 7 days  14 tablet  0    ???  vitamin B-12 (CYANOCOBALAMIN) 1000 MCG tablet  Take 1,000 mcg by mouth daily        ???  ibuprofen (ADVIL;MOTRIN) 600 MG tablet  Take 1 tablet by mouth every 8 hours as needed for Pain  120 tablet  3    ???  atorvastatin (LIPITOR) 20 MG tablet  Take 20 mg by mouth daily         ???  NEXIUM 40 MG capsule  Take 40 mg by mouth every morning (before breakfast)         ???  RA LORATADINE 10 MG tablet  Take 10 mg by mouth daily     0    ???  Multiple Vitamins-Iron (MULTI-VITAMIN/IRON) TABS  Take  by mouth Daily.        ???  CALCIUM CARBONATE-VIT D-MIN PO  Take  by mouth 2 times daily.        ???  aspirin 81 MG tablet  Take 81 mg by mouth daily.        ???  citalopram (CELEXA) 10 MG tablet  Take 10 mg by mouth. 1/2 tablet by mouth at bedtime        ???  LORazepam (ATIVAN) 0.5 MG tablet  Take 0.5 mg by mouth. 1 tablet by mouth at bedtime PRN        ???  budesonide-formoterol (SYMBICORT) 160-4.5 MCG/ACT AERO  Inhale 1 puff into the lungs 2 times daily.        ???  polyvinyl alcohol-povidone (HYPOTEARS) 1.4-0.6 % ophthalmic solution  Place 1-2 drops into both eyes as needed.        ???  nitrofurantoin, macrocrystal-monohydrate,  (MACROBID) 100 MG capsule  Take 100 mg by mouth 2 times daily.                  Past Medical History    Past Medical History    Diagnosis  Date    ???  Breast cancer ([place])      ???  SS (spinal stenosis)      ???  DDD (degenerative disc disease)      ???  LBP (low back pain)      ???  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  Hysterectomy        ???  Bladder suspension        ???  Cholecystectomy        ???  Breast lumpectomy        ???  Other surgical history    11/14/13, 06/23/11, 06/30/11, 07/12/11 12/29/11        L4/L5 IESI    ???  Other surgical history    12/17/13        L5/S1 IESI           Review of Systems   Constitutional: Positive for fatigue.   Respiratory: Negative for shortness of breath.    Cardiovascular: Positive for leg swelling. Negative for chest pain.   Gastrointestinal: Positive for constipation.   Genitourinary: Negative for difficulty urinating.   Musculoskeletal: Positive for myalgias, back pain, joint swelling, arthralgias and neck pain.   Neurological: Positive for weakness, numbness and headaches.   Psychiatric/Behavioral: Positive for sleep disturbance.        Objective:    Physical Exam   Constitutional: She is oriented to person, place, and time. Vital signs are normal. She appears well-developed and well-nourished. She is cooperative. No distress.   HENT:    Head: Normocephalic and atraumatic.   Cardiovascular: Normal rate and intact distal pulses.    Pulmonary/Chest: Effort normal. No respiratory distress.   Musculoskeletal:        Lumbar back: She exhibits decreased range of motion (flexion induces pain), tenderness, bony  tenderness (bilateral PSIS) and pain.       Back:    TYPE OF EXAM: MRI lumbar spine without contrast  DATE OF EXAM: 12/13/13  COMPARISON: 12/21/10  CLINICAL INDICATION: Degeneration of intervertebral disc of the lumbar region, lumbar spinal stenosis  FINDINGS: 2 mm retrolisthesis of  L5 on S1, 2 mm anterolisthesis of L4 on L5, and 2 mm retrolisthesis of L3 on L4, no pars interarticularis defect evident. Conus at the level of LI and appears unremarkable. Mild degenerative disease within the lumbar   spine. Cystic structures posterior to L4/5 facet joints and adjacent to the spinous process of L5 likely related to synovial cysts, largest is on the right measuring 1.6 cm. Fluid within L4/5 facet joints may be on a degenerative or inflammatory basis. 1  cm cyst lower pole left kidney unchanged.   L5/S1 disc bulge asymmetric to the left. Facet hypertrophic degenerative changes and ligamentum flavum hypertrophy are present. No central canal stenosis. Mild narrowing right neural foramen. Severe narrowing left neural foramen, facet hypertrophic   degenerative changes and disc contact exiting nerve root within the left neural foramen and disc contacts exiting nerve root just lateral to the left neural foramen.  L4/5 disc bulge along with facet hypertrophic degenerative changes. Severe central canal stenosis anterior posterior dimension of the thecal sac measures 6 mm. Mild narrowing bilateral neural foramina.  L3/4 left neural foraminal/lateral disc protrusion superimposed on disc bulge. Facet hypertrophic degenerative changes and ligamentum flavum hypertrophy present. No central canal stenosis. Mild narrowing right neural foramen. Moderate narrowing left   neural foramen, disc contacts exiting nerve root in the distal left neural foramen and just lateral to the left neural foramen.  L2/3 mild disc bulge slightly asymmetric to the left. Facet hypertrophic degenerative changes and ligamentum flavum hypertrophy present. No central canal stenosis. Right neural foramen patent. Mild narrowing left neural foramen.  L1/2 no disc herniation, central canal stenosis, or neural foramina compromise. Mild facet hypertrophic degenerative changes are present.  IMPRESSION: 1. Degenerative changes result in severe  central canal stenosis at L4/5 along with varying degrees of neural foraminal narrowing within the lumbar spine. See above for description of findings at each level. Overall, findings are similar when compared to   prior exam.          Neurological: She is alert and oriented to person, place, and time. She is not disoriented. No cranial nerve deficit.   Negative SLR   Skin: Skin is warm, dry and intact. She is not diaphoretic. No cyanosis. No pallor. Nails show no clubbing.   Psychiatric: She has a normal mood and affect. Her speech is normal.   Vitals reviewed.        Assessment:       1.  Pain of both sacroiliac joints     2.  Piriformis syndrome of left side     3.  Piriformis syndrome, right     4.  Lumbar radicular pain     5.  Neural foraminal stenosis of lumbar spine     6.  Chronic low back pain     7.  Spinal stenosis, lumbar                      Plan:     Schedule bilateral SIJ/piriformis injection. Two weeks later bilateral L5 TFE     Informed consent has been obtained for procedure. Roselyn Reef Dishongis  on blood thinners. Medications to hold have been reviewed with  patient.Blood thinners must be held with permission from your cardiologist or primary care physician. A letter is required to besent to PCP/Cardiologist regarding holding medications for procedure to decrease bleeding risk. Blood Thinners (antiplatelet/) medications that must be held prior to spinal cord stimulator trials, and implants, interlaminar epidurals, caudal epidurals, or transforaminal epidural injections:     Antiplatelets Aspirin, Effient, Plavix,Ticlid,  Brilinta, Savaysa for 3-5 days  Anticoagulant: Coumadin, Eliquis, Lovenox, Heparin, Pradaxa, Xarelto for 5-7 days.  All antiplatelets/anticoagulants must be held for 7 days for a spinal cord stimulator.

## 2014-09-26 NOTE — Op Note (Signed)
TRANSFORAMINAL EPIDURAL STEROID INJECTION    09/26/14    Surgeon: Willa Rough, MD    Pre-operative Diagnosis:   Patient Active Problem List   Diagnosis Code   ??? SS (spinal stenosis) M48.00   ??? 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 Lumbar facet joint syndrome M48.8X6   ??? 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 Lumbosacral stenosis M48.07   ??? 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 Pain of both sacroiliac joints M53.3   ??? Piriformis syndrome of left side G57.02   ??? Piriformis syndrome G57.00   ??? Lumbar radicular pain M54.16   ??? 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 Chronic low back pain M54.5, G89.29   ??? Spinal stenosis, lumbar M48.06       Post-operative Diagnosis: Same    Assistants: none    This is a 74 y.o. female patient with pain in the Back, left leg, right leg.?? Previous treatment and examination findings are noted in the H&P.B L5 transforaminal epidural injection has been requested for diagnostic and therapeutic reasons.    Conservative treatment was ineffective i.e.: ice, NSAIDS, rest, narcotic medication, chiropractic care, physical therapy and message therapy.    Patient is unable to perform the following ADL's: ambulating and grooming     Pain Assessment: 0-10  Pain Level: 7     Pain Orientation: Right, Left, Lower, Mid  Pain Location: Back  Pain Descriptors: Nagging    Last Plain films: 12/13/13      EXAMINATION:  1. B L5 transforaminal radiculogram/epidurogram.   2. B L5 transforaminal epidural anesthetic injection.   3. B L5 transforaminal epidural steroid injection.    CONSENT: Written consent was obtained from the patient on preprinted consent form after explaining the procedure, indications, potential complications and outcomes. Alternative treatments were also discussed.    DISCUSSION: The patient was sterilely prepped and draped in the usual fashion in the prone position. ???Time out??? was verified for correct patient, side, level and  procedure.    SEDATION:   No conscious sedation was performed during the procedure.?? The patient remained awake and conversed throughout the procedure.?? The patient underwent pulse oximetry and blood pressure monitoring independently by a trained observer, as well as by a physician.    PROCEDURE:  Under image-intensifier control, a 22 gauge needle x 5 inch spinal needle was guided successfully into the epidural space employing a posterior lateral/oblique approach. Needle aspiration was negative for heme or CSF.     Instillation of  .5 mL of Omnipaque 240 contrast medium opacified the spinal nerve and demonstrated contiguous flow into the epidural space. No vascular spread was noted. Digital subtraction was not employed to evaluate for vascular spread. The patient was monitored for any untoward reaction to contrast medium before proceeding with procedure #2. The patient did not report pain reproduction in a concordant distribution.    Following needle position verification, a test dose of .5 mL of sterile saline was administered and patient monitored for any adverse effects. Then, 1 mL of , dexamethasone (Decadron 10 mg/mL), was instilled into the epidural space and the patient's response was again monitored.  Finally,  .5 ml of 0.5% bupivacaine was then instilled. The patient's response was again monitored. The spinal needle was removed.    The patient tolerated the procedure well and without complications and was noted to be in stable condition prior to discharge from the procedure center with discharge instructions.  EBL: no blood loss    SPECIMEN: none    IMPRESSIONS:  1. R L5 transforaminal epidurogram, epidural anesthesia and epidural steroid injection procedures accomplished without incident.      RECOMMENDATIONS:  1. Complete and return ???Post-Procedure Pain and Activity Diary.???  2. Contact the [facility] for symptom exacerbation, fever or unusual symptoms.   3. Post-procedure care according  to verbal and written discharge instructions    POST-PROCEDURE EPIDUROGRAPHY INTERPRETATION:    EXAMINATION: AP, lateral, and oblique views    FLUORO TIME: 31 seconds    DISCUSSION: Spot views of the spine reveal normal alignment and segmentation.  Spinal needle is positioned at the R L5 neuroforamin. Contrast spreads and outlines the R L5 nerve/ neuroforamin and epidural space. The epidurogram reveals excellent contrast flow. Visualized spine reveals Degenerative Joint Disease- L5. Soft tissues reveal no abnormalities.    IMPRESSION: B L5 transforaminal epidurogram/epineurogram reveals satisfactory needle position and contrast spread.     Electronically signed by Willa Rough, MD on 09/26/2014 at 8:31 AM

## 2014-09-30 NOTE — Telephone Encounter (Signed)
Erroneous encounter

## 2014-10-10 ENCOUNTER — Encounter: Admit: 2014-10-10 | Primary: Family Medicine

## 2014-10-10 ENCOUNTER — Encounter: Admit: 2014-10-10 | Attending: Physical Medicine & Rehabilitation | Primary: Family Medicine

## 2014-10-10 ENCOUNTER — Inpatient Hospital Stay: Payer: MEDICARE | Attending: Physical Medicine & Rehabilitation | Primary: Family Medicine

## 2014-10-10 DIAGNOSIS — M5416 Radiculopathy, lumbar region: Secondary | ICD-10-CM

## 2014-10-10 MED ORDER — BUPIVACAINE HCL (PF) 0.5 % IJ SOLN
0.5 % | INTRAMUSCULAR | Status: AC
Start: 2014-10-10 — End: ?

## 2014-10-10 MED ORDER — DEXAMETHASONE SOD PHOSPHATE PF 10 MG/ML IJ SOLN
10 MG/ML | INTRAMUSCULAR | Status: AC
Start: 2014-10-10 — End: ?

## 2014-10-10 MED ORDER — BUFFERED LIDOCAINE 1%
Status: AC
Start: 2014-10-10 — End: ?

## 2014-10-10 MED FILL — BUPIVACAINE HCL (PF) 0.5 % IJ SOLN: 0.5 % | INTRAMUSCULAR | Qty: 30

## 2014-10-10 MED FILL — DEXAMETHASONE SOD PHOSPHATE PF 10 MG/ML IJ SOLN: 10 MG/ML | INTRAMUSCULAR | Qty: 1

## 2014-10-10 MED FILL — BUFFERED LIDOCAINE 1%: Qty: 20

## 2014-10-10 NOTE — Op Note (Signed)
TRANSFORAMINAL EPIDURAL STEROID INJECTION    10/10/14    Surgeon: Willa Rough, MD    Pre-operative Diagnosis:   Patient Active Problem List   Diagnosis Code   ??? SS (spinal stenosis) M48.00   ??? 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 Lumbar facet joint syndrome M48.8X6   ??? 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 Lumbosacral stenosis M48.07   ??? 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 Pain of both sacroiliac joints M53.3   ??? Piriformis syndrome of left side G57.02   ??? Piriformis syndrome G57.00   ??? Lumbar radicular pain M54.16   ??? 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 Chronic low back pain M54.5, G89.29   ??? Spinal stenosis, lumbar M48.06       Post-operative Diagnosis: Same    Assistants: none    This is a 74 y.o. female patient with pain in the Back, left leg, right leg.?? Previous treatment and examination findings are noted in the H&P.B L5 transforaminal epidural injection has been requested for diagnostic and therapeutic reasons.    Conservative treatment was ineffective i.e.: ice, NSAIDS, rest, narcotic medication, chiropractic care, physical therapy and message therapy.    Patient is unable to perform the following ADL's: ambulating and grooming     Pain Assessment: 0-10  Pain Level: 7     Pain Orientation: Lower  Pain Location: Back       Last Plain films: 12/13/13      EXAMINATION:  1. B L5 B L5 transforaminal radiculogram/epidurogram.   2. B L5 transforaminal epidural anesthetic injection.   3. B L5 transforaminal epidural steroid injection.    CONSENT: Written consent was obtained from the patient on preprinted consent form after explaining the procedure, indications, potential complications and outcomes. Alternative treatments were also discussed.    DISCUSSION: The patient was sterilely prepped and draped in the usual fashion in the prone position. ???Time out??? was verified for correct patient, side, level and procedure.    SEDATION:   No  conscious sedation was performed during the procedure.?? The patient remained awake and conversed throughout the procedure.?? The patient underwent pulse oximetry and blood pressure monitoring independently by a trained observer, as well as by a physician.    PROCEDURE:  Under image-intensifier control, a 22 gauge needle x 5 inch spinal needle was guided successfully into the epidural space employing a posterior lateral/oblique approach. Needle aspiration was negative for heme or CSF.     Instillation of  .5 mL of Omnipaque 240 contrast medium opacified the spinal nerve and demonstrated contiguous flow into the epidural space. No vascular spread was noted. Digital subtraction was not employed to evaluate for vascular spread. The patient was monitored for any untoward reaction to contrast medium before proceeding with procedure #2. The patient did not report pain reproduction in a concordant distribution.    Following needle position verification, a test dose of .5 mL of sterile saline was administered and patient monitored for any adverse effects. Then, 1 mL of dexamethasone (Decadron 10 mg/mL),  was instilled into the epidural space and the patient's response was again monitored.  Finally,  .5 ml of 0.5% bupivacaine was then instilled. The patient's response was again monitored. The spinal needle was removed.    The patient tolerated the procedure well and without complications and was noted to be in stable condition prior to discharge from the procedure center with discharge instructions.  EBL: no blood loss    SPECIMEN: none    IMPRESSIONS:  1. B L5 transforaminal epidurogram, epidural anesthesia and epidural steroid injection procedures accomplished without incident.      RECOMMENDATIONS:  1. Complete and return ???Post-Procedure Pain and Activity Diary.???  2. Contact the [facility] for symptom exacerbation, fever or unusual symptoms.   3. Post-procedure care according to verbal and written  discharge instructions    POST-PROCEDURE EPIDUROGRAPHY INTERPRETATION:    EXAMINATION: AP, lateral, and oblique views    FLUORO TIME: 31 seconds    DISCUSSION: Spot views of the spine reveal normal alignment and segmentation.  Spinal needle is positioned at the B L5 neuroforamin. Contrast spreads and outlines the BL5 nerve/ neuroforamin and epidural space. The epidurogram reveals excellent contrast flow. Visualized spine reveals Degenerative Joint Disease- L5. Soft tissues reveal no abnormalities.    IMPRESSION: B L5 transforaminal epidurogram/epineurogram reveals satisfactory needle position and contrast spread.     Electronically signed by Willa Rough, MD on 10/10/2014 at 9:40 AM

## 2014-10-10 NOTE — Other (Signed)
Patient Acct Nbr:  [id number]  Primary AUTH/CERT:    [place] Name:   [place]  Primary Insurance Plan Name:    Primary Insurance Group Number:    Primary Insurance Plan Type: [facility]  Primary Insurance Policy Number:  [id number]    Secondary AUTH/CERT:    [place] Name:   [facility]  Secondary Insurance Plan Name:  [place]  Secondary Insurance Group Number:  [id number]  Secondary Insurance Plan Type: [employer]  Secondary Insurance Policy Number:  [id number]    Tertiary AUTH/CERT:    [place] Name:   [facility] Insurance Plan Name:  [facility] Insurance Group Number:    [facility] Insurance Plan Type: [employer] Policy Number:  [id number]

## 2014-10-10 NOTE — H&P (Signed)
[name], NP Nurse Practitioner Signed  Progress Notes Date of Service: 09/24/2014  9:23 AM   Related encounter: Office Visit from 09/24/2014 in [facility]      Expand All Collapse All    Hide copied text  Hover for attribution information  Subjective:        Patient ID: Katie Juarez is a 74 y.o. female.     HPI Post diagnostic MBB, no relief. Feels it made her pain worse. When she first started injections, L4/L5 IESI effective. At that time, she was not have radicular pain     Pain Assessment  Location of Pain: Leg  Location Modifiers: Left, Right  Severity of Pain: 7  Quality of Pain: Aching  Duration of Pain: Persistent  Frequency of Pain: Constant  Aggravating Factors: Other (Comment), Standing, Walking (shoots from buttocks into legs )  Limiting Behavior: Some  Relieving Factors: Rest, Ice, Heat  Are there other pain locations you wish to document?: Yes          Allergies    Allergen  Reactions    ???  Pcn [Penicillins]  Hives    ???  Zithromax [Azithromycin]  Hives           Active Medications           Outpatient Prescriptions Marked as Taking for the 09/24/14 encounter (Office Visit) with [name], NP    Medication  Sig  Dispense  Refill    ???  acetaminophen (TYLENOL) 500 MG tablet  Take 1,000 mg by mouth every 6 hours as needed for Pain        ???  clobetasol (TEMOVATE) 0.05 % cream  Apply topically twice a week Apply topically 2 times daily.        ???  ibuprofen (ADVIL;MOTRIN) 600 MG tablet  Take 1 tablet by mouth every 8 hours as needed for Pain  120 tablet  3    ???  atorvastatin (LIPITOR) 20 MG tablet  Take 20 mg by mouth daily         ???  NEXIUM 40 MG capsule  Take 40 mg by mouth every morning (before breakfast)         ???  RA LORATADINE 10 MG tablet  Take 10 mg by mouth daily     0    ???  Multiple Vitamins-Iron (MULTI-VITAMIN/IRON) TABS  Take by mouth Daily.        ???  CALCIUM CARBONATE-VIT D-MIN PO  Take by mouth 2 times daily.        ???  aspirin 81 MG tablet  Take 81 mg by mouth daily.         ???  citalopram (CELEXA) 10 MG tablet  Take 10 mg by mouth nightly         ???  LORazepam (ATIVAN) 0.5 MG tablet  Take 0.5 mg by mouth. 1 tablet by mouth at bedtime PRN        ???  budesonide-formoterol (SYMBICORT) 160-4.5 MCG/ACT AERO  Inhale 1 puff into the lungs 2 times daily.        ???  polyvinyl alcohol-povidone (HYPOTEARS) 1.4-0.6 % ophthalmic solution  Place 1-2 drops into both eyes as needed.                  Past Medical History         Past Medical History    Diagnosis  Date    ???  Breast cancer ([place])      ???  DDD (degenerative disc disease)      ???  LBP (low back pain)      ???  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  SS (spinal stenosis)                Past Surgical History           Past Surgical History    Procedure  Laterality  Date    ???  Hysterectomy        ???  Bladder suspension        ???  Cholecystectomy        ???  Breast lumpectomy        ???  Other surgical history    11/14/13, 06/23/11, 06/30/11, 07/12/11 12/29/11        L4/L5 IESI    ???  Other surgical history    12/17/13        L5/S1 IESI    ???  Other surgical history  Bilateral  05/30/14        SIJ and piriformis    ???  Other surgical history  Bilateral  09/16/2014        L3, L4, L5 Diagnostic Medial Branch Block          Review of Systems   Constitutional: Positive for fatigue.   Respiratory: Negative for shortness of breath.   Cardiovascular: Positive for leg swelling. Negative for chest pain.   Gastrointestinal: Positive for constipation.   Genitourinary: Negative for difficulty urinating.   Musculoskeletal: Positive for arthralgias, back pain, joint swelling, myalgias and neck pain.   Neurological: Positive for weakness, numbness and headaches.   Psychiatric/Behavioral: Positive for sleep disturbance.         Objective:    Physical Exam   Constitutional: She is oriented to person, place, and time. Vital signs are normal. She appears well-developed and well-nourished. She is cooperative. No distress.   HENT:    Head: Normocephalic and  atraumatic.   Cardiovascular: Normal rate and intact distal pulses.   Pulmonary/Chest: Effort normal. No respiratory distress.   Musculoskeletal:   Lumbar back: She exhibits decreased range of motion, tenderness and pain.   Neurological: She is alert and oriented to person, place, and time. She is not disoriented. No cranial nerve deficit.   Skin: Skin is warm, dry and intact. She is not diaphoretic. No cyanosis. No pallor. Nails show no clubbing.   Psychiatric: She has a normal mood and affect. Her speech is normal.   Vitals reviewed.        Assessment:       1.  Lumbar radicular pain     2.  Neural foraminal stenosis of lumbar spine     3.  Chronic bilateral low back pain with bilateral sciatica            Plan:     Schedule bilateral L5 TFE x2     Informed consent has been obtained for procedure. Roselyn Reef Dishongis  on blood thinners. Medications to hold have been reviewed with patient.Blood thinners must be held with permission from your cardiologist or primary care physician. A letter is  required to besent to PCP/Cardiologist regarding holding medications for procedure to decrease bleeding risk. Blood Thinners (antiplatelet/) medications that must be held prior to spinal cord stimulator trials, and implants, interlaminar epidurals, caudal epidurals, or transforaminal epidural injections:    Antiplatelets Aspirin, Effient, Plavix,Ticlid, Brilinta, Savaysa for 3-5 days  Anticoagulant: Coumadin, Eliquis, Lovenox,  Heparin, Pradaxa, Xarelto for 5-7 days.  All antiplatelets/anticoagulants must be held for 7 days for a spinal cord stimulator.                                [name], NP Nurse Practitioner Signed  Progress Notes Date of Service: 09/24/2014  9:23 AM   Related encounter: Office Visit from 09/24/2014 in [facility]      Expand All Collapse All    Hide copied text  Hover for attribution information  Subjective:        Patient ID: Katie Juarez is a 74 y.o. female.     HPI Post diagnostic  MBB, no relief. Feels it made her pain worse. When she first started injections, L4/L5 IESI effective. At that time, she was not have radicular pain     Pain Assessment  Location of Pain: Leg  Location Modifiers: Left, Right  Severity of Pain: 7  Quality of Pain: Aching  Duration of Pain: Persistent  Frequency of Pain: Constant  Aggravating Factors: Other (Comment), Standing, Walking (shoots from buttocks into legs )  Limiting Behavior: Some  Relieving Factors: Rest, Ice, Heat  Are there other pain locations you wish to document?: Yes          Allergies    Allergen  Reactions    ???  Pcn [Penicillins]  Hives    ???  Zithromax [Azithromycin]  Hives           Active Medications           Outpatient Prescriptions Marked as Taking for the 09/24/14 encounter (Office Visit) with [name], NP    Medication  Sig  Dispense  Refill    ???  acetaminophen (TYLENOL) 500 MG tablet  Take 1,000 mg by mouth every 6 hours as needed for Pain        ???  clobetasol (TEMOVATE) 0.05 % cream  Apply topically twice a week Apply topically 2 times daily.        ???  ibuprofen (ADVIL;MOTRIN) 600 MG tablet  Take 1 tablet by mouth every 8 hours as needed for Pain  120 tablet  3    ???  atorvastatin (LIPITOR) 20 MG tablet  Take 20 mg by mouth daily         ???  NEXIUM 40 MG capsule  Take 40 mg by mouth every morning (before breakfast)         ???  RA LORATADINE 10 MG tablet  Take 10 mg by mouth daily     0    ???  Multiple Vitamins-Iron (MULTI-VITAMIN/IRON) TABS  Take by mouth Daily.        ???  CALCIUM CARBONATE-VIT D-MIN PO  Take by mouth 2 times daily.        ???  aspirin 81 MG tablet  Take 81 mg by mouth daily.        ???  citalopram (CELEXA) 10 MG tablet  Take 10 mg by mouth nightly         ???  LORazepam (ATIVAN) 0.5 MG tablet  Take 0.5 mg by mouth. 1 tablet by mouth at bedtime PRN        ???  budesonide-formoterol (SYMBICORT) 160-4.5 MCG/ACT AERO  Inhale 1 puff into the lungs 2 times daily.        ???  polyvinyl alcohol-povidone (HYPOTEARS) 1.4-0.6 % ophthalmic  solution  Place 1-2  drops into both eyes as needed.                  Past Medical History         Past Medical History    Diagnosis  Date    ???  Breast cancer ([place])      ???  DDD (degenerative disc disease)      ???  LBP (low back pain)      ???  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  SS (spinal stenosis)                Past Surgical History           Past Surgical History    Procedure  Laterality  Date    ???  Hysterectomy        ???  Bladder suspension        ???  Cholecystectomy        ???  Breast lumpectomy        ???  Other surgical history    11/14/13, 06/23/11, 06/30/11, 07/12/11 12/29/11        L4/L5 IESI    ???  Other surgical history    12/17/13        L5/S1 IESI    ???  Other surgical history  Bilateral  05/30/14        SIJ and piriformis    ???  Other surgical history  Bilateral  09/16/2014        L3, L4, L5 Diagnostic Medial Branch Block          Review of Systems   Constitutional: Positive for fatigue.   Respiratory: Negative for shortness of breath.   Cardiovascular: Positive for leg swelling. Negative for chest pain.   Gastrointestinal: Positive for constipation.   Genitourinary: Negative for difficulty urinating.   Musculoskeletal: Positive for arthralgias, back pain, joint swelling, myalgias and neck pain.   Neurological: Positive for weakness, numbness and headaches.   Psychiatric/Behavioral: Positive for sleep disturbance.         Objective:    Physical Exam   Constitutional: She is oriented to person, place, and time. Vital signs are normal. She appears well-developed and well-nourished. She is cooperative. No distress.   HENT:    Head: Normocephalic and atraumatic.   Cardiovascular: Normal rate and intact distal pulses.   Pulmonary/Chest: Effort normal. No respiratory distress.   Musculoskeletal:   Lumbar back: She exhibits decreased range of motion, tenderness and pain.   Neurological: She is alert and oriented to person, place, and time. She is not disoriented. No cranial nerve deficit.   Skin: Skin  is warm, dry and intact. She is not diaphoretic. No cyanosis. No pallor. Nails show no clubbing.   Psychiatric: She has a normal mood and affect. Her speech is normal.   Vitals reviewed.        Assessment:       1.  Lumbar radicular pain     2.  Neural foraminal stenosis of lumbar spine     3.  Chronic bilateral low back pain with bilateral sciatica            Plan:     Schedule bilateral L5 TFE x2     Informed consent has been obtained for procedure. Roselyn Reef Dishongis  on blood thinners. Medications to hold have been reviewed with patient.Blood thinners must be held with permission from your cardiologist or primary care physician. A  letter is  required to besent to PCP/Cardiologist regarding holding medications for procedure to decrease bleeding risk. Blood Thinners (antiplatelet/) medications that must be held prior to spinal cord stimulator trials, and implants, interlaminar epidurals, caudal epidurals, or transforaminal epidural injections:    Antiplatelets Aspirin, Effient, Plavix,Ticlid, Brilinta, Savaysa for 3-5 days  Anticoagulant: Coumadin, Eliquis, Lovenox, Heparin, Pradaxa, Xarelto for 5-7 days.  All antiplatelets/anticoagulants must be held for 7 days for a spinal cord stimulator.                                Willa Rough, MD Physician Signed  Progress Notes Date of Service: 09/10/2014  8:18 AM   Related encounter: Office Visit from 09/10/2014 in [facility]      Expand All Collapse All    Hide copied text  Hover for attribution information  PAIN MANAGEMENT FOLLOW-UP NOTE  09/10/14     CHIEF COMPLAINT: This is a 74 y.o. female patient who returns to the Pain Management Clinic with a history of No chief complaint on file.        PAIN HPI: IYLAH DWORKIN returns today for reevaluation. Since the visit, the patient reports that the pain is not changed. Seems that S-I and ESIS are now not effective for B Gluteal and Posterior upper thigh pain     ANALGESIA:   Are your Current Pain medication (s)  helping to decrease pain? No.    Current Pain score:       ADVERSE AFFECTS:   Medication Side Effects: No.     ACTIVITY:  Are you able to be more active with your pain medications? No        ABERRANT BEHAVIORS SINCE LAST VISIT? No     Review of Systems   Constitutional: Positive for fatigue.   HENT: Negative.   Eyes: Negative.   Respiratory: Negative.   Cardiovascular: Negative.   Gastrointestinal: Negative for constipation and diarrhea.   Endocrine: Negative.   Genitourinary: Negative for difficulty urinating and flank pain.   Musculoskeletal: Positive for back pain and myalgias.   Skin: Negative.   Allergic/Immunologic: Negative.   Neurological: Positive for weakness and numbness.   Hematological: Negative.   Psychiatric/Behavioral: Positive for sleep disturbance.              Allergies    Allergen  Reactions    ???  Pcn [Penicillins]  Hives    ???  Zithromax [Azithromycin]  Hives           Medications Prior to Visit           Outpatient Medications Prior to Visit    Medication  Sig  Dispense  Refill    ???  ibuprofen (ADVIL;MOTRIN) 600 MG tablet  Take 1 tablet by mouth every 8 hours as needed for Pain  120 tablet  3    ???  atorvastatin (LIPITOR) 20 MG tablet  Take 20 mg by mouth daily         ???  NEXIUM 40 MG capsule  Take 40 mg by mouth every morning (before breakfast)         ???  RA LORATADINE 10 MG tablet  Take 10 mg by mouth daily     0    ???  Multiple Vitamins-Iron (MULTI-VITAMIN/IRON) TABS  Take by mouth Daily.        ???  CALCIUM CARBONATE-VIT D-MIN PO  Take by mouth 2  times daily.        ???  aspirin 81 MG tablet  Take 81 mg by mouth daily.        ???  citalopram (CELEXA) 10 MG tablet  Take 10 mg by mouth nightly         ???  LORazepam (ATIVAN) 0.5 MG tablet  Take 0.5 mg by mouth. 1 tablet by mouth at bedtime PRN        ???  budesonide-formoterol (SYMBICORT) 160-4.5 MCG/ACT AERO  Inhale 1 puff into the lungs 2 times daily.        ???  polyvinyl alcohol-povidone (HYPOTEARS) 1.4-0.6 % ophthalmic solution  Place 1-2 drops into  both eyes as needed.        ???  vitamin B-12 (CYANOCOBALAMIN) 1000 MCG tablet  Take 1,000 mcg by mouth daily           No facility-administered medications prior to visit.               Past Medical History         Past Medical History    Diagnosis  Date    ???  Breast cancer ([place])      ???  DDD (degenerative disc disease)      ???  LBP (low back pain)      ???  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  SS (spinal stenosis)                Past Surgical History           Past Surgical History    Procedure  Laterality  Date    ???  Hysterectomy        ???  Bladder suspension        ???  Cholecystectomy        ???  Breast lumpectomy        ???  Other surgical history    11/14/13, 06/23/11, 06/30/11, 07/12/11 12/29/11        L4/L5 IESI    ???  Other surgical history    12/17/13        L5/S1 IESI    ???  Other surgical history  Bilateral  05/30/14        SIJ and piriformis             Family and Social History reviewed in the electronic medical record.     Imaging: Reviewed available imaging in our system with the patient.  No results found.     Objective:       Physical Exam:   Vitals        Vitals:      09/10/14 0812    Weight:  153 lb 9.6 oz (69.7 kg)                Physical Exam   Constitutional: She is oriented to person, place, and time. She appears well-developed and well-nourished. No distress.   HENT:    Head: Normocephalic and atraumatic.   Right Ear: External ear normal. No decreased hearing is noted.   Left Ear: External ear normal. No decreased hearing is noted.    Nose: Nose normal.    Mouth/Throat: Oropharynx is clear and moist and mucous membranes are normal.   Eyes: Conjunctivae and lids are normal. No scleral icterus.   Neck: Phonation normal.   Cardiovascular:   No BLE edema present   Pulmonary/Chest: Effort normal. No accessory muscle usage.  No respiratory distress.   Abdominal: Normal appearance.   Genitourinary:   Genitourinary Comments: deferred   Neurological: She is alert and oriented to person, place, and  time.   Skin: Skin is warm, dry and intact. No rash noted. She is not diaphoretic. No cyanosis or erythema. No pallor.   Psychiatric: She has a normal mood and affect. Her speech is normal and behavior is normal.      Back Exam      Comments: Mild [place] B L3,4,5  Slump +  [place] +            Assessment:    This is a 73 y.o. female patient with:     Diagnosis:    No diagnosis found.     Medical Comorbidities:  As listed in the patient's past medical and surgical history     Functional Limitations:    Pain limits function and quality of life.      Plan:       B L3,4,5 MBB Diagnostic  Meds:    Controlled Substances Monitoring: Pt educated about the risks of taking opiates, including increased sedation, constipation, slowed breathing, tolerance, dependence, and addiction.           New Prescriptions      No medications on file           Encounter Medications     No orders of the defined types were placed in this encounter.      No orders of the defined types were placed in this encounter.        No Follow-up on file.     The patient expressed understanding of the above assessment and plan.     Total time spent face to face with patient was 20 minutes in which 50% or more of the time was spent in counseling, education about risk and benefits of the above plan, and coordination of care.                      Willa Rough, MD Physician Signed General Surgery H&P 05/30/2014  8:03 AM   Related encounter: Admission (Discharged) from OP Visit from 05/30/2014 in [place], NP  Nurse Practitioner  Signed    Progress Notes  05/09/2014 11:27 AM    Related encounter: Office Visit from 05/09/2014 in [facility]        Expand All Collapse All   Subjective:         Patient ID: Katie Juarez is a 74 y.o. female.    HPI Underwent IESI's previously in 11/2013 and 12/2013, no longer effective. Low back pain, pain in buttocks as well as posterior thigh. Worse with sitting.       Allergies     Allergen   Reactions     ???   Pcn [Penicillins]        ???   Zithromax [Azithromycin]                Active Medications      Outpatient Prescriptions Marked as Taking for the 05/09/14 encounter (Office Visit) with [name], NP     Medication   Sig   Dispense   Refill     ???   sulfamethoxazole-trimethoprim (BACTRIM DS) 800-160 MG per tablet   Take 1 tablet by mouth 2 times daily for 7 days   14  tablet   0     ???   vitamin B-12 (CYANOCOBALAMIN) 1000 MCG tablet   Take 1,000 mcg by mouth daily           ???   ibuprofen (ADVIL;MOTRIN) 600 MG tablet   Take 1 tablet by mouth every 8 hours as needed for Pain   120 tablet   3     ???   atorvastatin (LIPITOR) 20 MG tablet   Take 20 mg by mouth daily            ???   NEXIUM 40 MG capsule   Take 40 mg by mouth every morning (before breakfast)            ???   RA LORATADINE 10 MG tablet   Take 10 mg by mouth daily       0     ???   Multiple Vitamins-Iron (MULTI-VITAMIN/IRON) TABS   Take  by mouth Daily.           ???   CALCIUM CARBONATE-VIT D-MIN PO   Take  by mouth 2 times daily.           ???   aspirin 81 MG tablet   Take 81 mg by mouth daily.           ???   citalopram (CELEXA) 10 MG tablet   Take 10 mg by mouth. 1/2 tablet by mouth at bedtime           ???   LORazepam (ATIVAN) 0.5 MG tablet   Take 0.5 mg by mouth. 1 tablet by mouth at bedtime PRN           ???   budesonide-formoterol (SYMBICORT) 160-4.5 MCG/ACT AERO   Inhale 1 puff into the lungs 2 times daily.           ???   polyvinyl alcohol-povidone (HYPOTEARS) 1.4-0.6 % ophthalmic solution   Place 1-2 drops into both eyes as needed.           ???   nitrofurantoin, macrocrystal-monohydrate, (MACROBID) 100 MG capsule   Take 100 mg by mouth 2 times daily.                       Past Medical History      Past Medical History     Diagnosis   Date     ???   Breast cancer ([place])        ???   SS (spinal stenosis)        ???   DDD (degenerative disc disease)        ???   LBP (low back pain)        ???   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   Hysterectomy           ???   Bladder suspension           ???   Cholecystectomy           ???   Breast lumpectomy           ???   Other surgical history      11/14/13, 06/23/11, 06/30/11, 07/12/11 12/29/11  L4/L5 IESI     ???   Other surgical history      12/17/13           L5/S1 IESI             Review of Systems    Constitutional: Positive for fatigue.   Respiratory: Negative for shortness of breath.     Cardiovascular: Positive for leg swelling. Negative for chest pain.    Gastrointestinal: Positive for constipation.   Genitourinary: Negative for difficulty urinating.    Musculoskeletal: Positive for myalgias, back pain, joint swelling, arthralgias and neck pain.   Neurological: Positive for weakness, numbness and headaches.   Psychiatric/Behavioral: Positive for sleep disturbance.        Objective:     Physical Exam   Constitutional: She is oriented to person, place, and time. Vital signs are normal. She appears well-developed and well-nourished. She is cooperative. No distress.    HENT:    Head: Normocephalic and atraumatic.    Cardiovascular: Normal rate and intact distal pulses.     Pulmonary/Chest: Effort normal. No respiratory distress.   Musculoskeletal:        Lumbar back: She exhibits decreased range of motion (flexion induces pain), tenderness, bony tenderness (bilateral PSIS) and pain.       Back:    TYPE OF EXAM: MRI lumbar spine without contrast  DATE OF EXAM: 12/13/13  COMPARISON: 12/21/10  CLINICAL INDICATION: Degeneration of intervertebral disc of the lumbar region, lumbar spinal stenosis  FINDINGS: 2 mm retrolisthesis of L5 on S1, 2 mm anterolisthesis of L4 on L5, and 2 mm retrolisthesis of L3 on L4, no pars interarticularis defect evident. Conus at the level of LI and appears unremarkable. Mild degenerative disease within the lumbar   spine. Cystic structures posterior to L4/5 facet  joints and adjacent to the spinous process of L5 likely related to synovial cysts, largest is on the right measuring 1.6 cm. Fluid within L4/5 facet joints may be on a degenerative or inflammatory basis. 1  cm cyst lower pole left kidney unchanged.   L5/S1 disc bulge asymmetric to the left. Facet hypertrophic degenerative changes and ligamentum flavum hypertrophy are present. No central canal stenosis. Mild narrowing right neural foramen. Severe narrowing left neural foramen, facet hypertrophic   degenerative changes and disc contact exiting nerve root within the left neural foramen and disc contacts exiting nerve root just lateral to the left neural foramen.  L4/5 disc bulge along with facet hypertrophic degenerative changes. Severe central canal stenosis anterior posterior dimension of the thecal sac measures 6 mm. Mild narrowing bilateral neural foramina.  L3/4 left neural foraminal/lateral disc protrusion superimposed on disc bulge. Facet hypertrophic degenerative changes and ligamentum flavum hypertrophy present. No central canal stenosis. Mild narrowing right neural foramen. Moderate narrowing left   neural foramen, disc contacts exiting nerve root in the distal left neural foramen and just lateral to the left neural foramen.  L2/3 mild disc bulge slightly asymmetric to the left. Facet hypertrophic degenerative changes and ligamentum flavum hypertrophy present. No central canal stenosis. Right neural foramen patent. Mild narrowing left neural foramen.  L1/2 no disc herniation, central canal stenosis, or neural foramina compromise. Mild facet hypertrophic degenerative changes are present.  IMPRESSION: 1. Degenerative changes result in severe central canal stenosis at L4/5 along with varying degrees of neural foraminal narrowing within the lumbar spine. See above for description of findings at each level. Overall, findings are similar when compared to   prior exam.  Neurological: She is alert and  oriented to person, place, and time. She is not disoriented. No cranial nerve deficit.   Negative SLR    Skin: Skin is warm, dry and intact. She is not diaphoretic. No cyanosis. No pallor. Nails show no clubbing.   Psychiatric: She has a normal mood and affect. Her speech is normal.    Vitals reviewed.        Assessment:        1.   Pain of both sacroiliac joints      2.   Piriformis syndrome of left side      3.   Piriformis syndrome, right      4.   Lumbar radicular pain      5.   Neural foraminal stenosis of lumbar spine      6.   Chronic low back pain      7.   Spinal stenosis, lumbar                        Plan:      Schedule bilateral SIJ/piriformis injection. Two weeks later bilateral L5 TFE     Informed consent has been obtained for procedure. Roselyn Reef Dishongis  on blood thinners. Medications to hold have been reviewed with patient.Blood thinners must be held with permission from your cardiologist or primary care physician. A letter is required to besent to PCP/Cardiologist regarding holding medications for procedure to decrease bleeding risk. Blood Thinners (antiplatelet/) medications that must be held prior to spinal cord stimulator trials, and implants, interlaminar epidurals, caudal epidurals, or transforaminal epidural injections:     Antiplatelets Aspirin, Effient, Plavix,Ticlid,  Brilinta, Savaysa for 3-5 days  Anticoagulant: Coumadin, Eliquis, Lovenox, Heparin, Pradaxa, Xarelto for 5-7 days.  All antiplatelets/anticoagulants must be held for 7 days for a spinal cord stimulator.                                                                          [name], NP Nurse Practitioner Signed  Progress Notes 05/09/2014 11:27 AM   Related encounter: Office Visit from 05/09/2014 in [facility]      Expand All Collapse All   Subjective:        Patient ID: TAHNI PORCHIA is a 75 y.o. female.    HPI Underwent IESI's previously in 11/2013 and 12/2013, no longer effective. Low back pain, pain in  buttocks as well as posterior thigh. Worse with sitting.      Allergies    Allergen  Reactions    ???  Pcn [Penicillins]      ???  Zithromax [Azithromycin]             Active Medications    Outpatient Prescriptions Marked as Taking for the 05/09/14 encounter (Office Visit) with [name], NP    Medication  Sig  Dispense  Refill    ???  sulfamethoxazole-trimethoprim (BACTRIM DS) 800-160 MG per tablet  Take 1 tablet by mouth 2 times daily for 7 days  14 tablet  0    ???  vitamin B-12 (CYANOCOBALAMIN) 1000 MCG tablet  Take 1,000 mcg by mouth daily        ???  ibuprofen (ADVIL;MOTRIN) 600 MG tablet  Take 1 tablet by mouth every 8 hours as needed for Pain  120 tablet  3    ???  atorvastatin (LIPITOR) 20 MG tablet  Take 20 mg by mouth daily         ???  NEXIUM 40 MG capsule  Take 40 mg by mouth every morning (before breakfast)         ???  RA LORATADINE 10 MG tablet  Take 10 mg by mouth daily     0    ???  Multiple Vitamins-Iron (MULTI-VITAMIN/IRON) TABS  Take  by mouth Daily.        ???  CALCIUM CARBONATE-VIT D-MIN PO  Take  by mouth 2 times daily.        ???  aspirin 81 MG tablet  Take 81 mg by mouth daily.        ???  citalopram (CELEXA) 10 MG tablet  Take 10 mg by mouth. 1/2 tablet by mouth at bedtime        ???  LORazepam (ATIVAN) 0.5 MG tablet  Take 0.5 mg by mouth. 1 tablet by mouth at bedtime PRN        ???  budesonide-formoterol (SYMBICORT) 160-4.5 MCG/ACT AERO  Inhale 1 puff into the lungs 2 times daily.        ???  polyvinyl alcohol-povidone (HYPOTEARS) 1.4-0.6 % ophthalmic solution  Place 1-2 drops into both eyes as needed.        ???  nitrofurantoin, macrocrystal-monohydrate, (MACROBID) 100 MG capsule  Take 100 mg by mouth 2 times daily.                  Past Medical History    Past Medical History    Diagnosis  Date    ???  Breast cancer ([place])      ???  SS (spinal stenosis)      ???  DDD (degenerative disc disease)      ???  LBP (low back pain)      ???  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  Hysterectomy        ???  Bladder suspension        ???  Cholecystectomy        ???  Breast lumpectomy        ???  Other surgical history    11/14/13, 06/23/11, 06/30/11, 07/12/11 12/29/11        L4/L5 IESI    ???  Other surgical history    12/17/13        L5/S1 IESI           Review of Systems   Constitutional: Positive for fatigue.   Respiratory: Negative for shortness of breath.    Cardiovascular: Positive for leg swelling. Negative for chest pain.   Gastrointestinal: Positive for constipation.   Genitourinary: Negative for difficulty urinating.   Musculoskeletal: Positive for myalgias, back pain, joint swelling, arthralgias and neck pain.   Neurological: Positive for weakness, numbness and headaches.   Psychiatric/Behavioral: Positive for sleep disturbance.        Objective:    Physical Exam   Constitutional: She is oriented to person, place, and time. Vital signs are normal. She appears well-developed and well-nourished. She is cooperative. No distress.  HENT:    Head: Normocephalic and atraumatic.   Cardiovascular: Normal rate and intact distal pulses.    Pulmonary/Chest: Effort normal. No respiratory distress.   Musculoskeletal:        Lumbar back: She exhibits decreased range of motion (flexion induces pain), tenderness, bony tenderness (bilateral PSIS) and pain.       Back:    TYPE OF EXAM: MRI lumbar spine without contrast  DATE OF EXAM: 12/13/13  COMPARISON: 12/21/10  CLINICAL INDICATION: Degeneration of intervertebral disc of the lumbar region, lumbar spinal stenosis  FINDINGS: 2 mm retrolisthesis of L5 on S1, 2 mm anterolisthesis of L4 on L5, and 2 mm retrolisthesis of L3 on L4, no pars interarticularis defect evident. Conus at the level of LI and appears unremarkable. Mild degenerative disease within the lumbar   spine. Cystic structures posterior to L4/5 facet joints and adjacent to the spinous process of L5 likely related to synovial cysts, largest is on  the right measuring 1.6 cm. Fluid within L4/5 facet joints may be on a degenerative or inflammatory basis. 1  cm cyst lower pole left kidney unchanged.   L5/S1 disc bulge asymmetric to the left. Facet hypertrophic degenerative changes and ligamentum flavum hypertrophy are present. No central canal stenosis. Mild narrowing right neural foramen. Severe narrowing left neural foramen, facet hypertrophic   degenerative changes and disc contact exiting nerve root within the left neural foramen and disc contacts exiting nerve root just lateral to the left neural foramen.  L4/5 disc bulge along with facet hypertrophic degenerative changes. Severe central canal stenosis anterior posterior dimension of the thecal sac measures 6 mm. Mild narrowing bilateral neural foramina.  L3/4 left neural foraminal/lateral disc protrusion superimposed on disc bulge. Facet hypertrophic degenerative changes and ligamentum flavum hypertrophy present. No central canal stenosis. Mild narrowing right neural foramen. Moderate narrowing left   neural foramen, disc contacts exiting nerve root in the distal left neural foramen and just lateral to the left neural foramen.  L2/3 mild disc bulge slightly asymmetric to the left. Facet hypertrophic degenerative changes and ligamentum flavum hypertrophy present. No central canal stenosis. Right neural foramen patent. Mild narrowing left neural foramen.  L1/2 no disc herniation, central canal stenosis, or neural foramina compromise. Mild facet hypertrophic degenerative changes are present.  IMPRESSION: 1. Degenerative changes result in severe central canal stenosis at L4/5 along with varying degrees of neural foraminal narrowing within the lumbar spine. See above for description of findings at each level. Overall, findings are similar when compared to   prior exam.          Neurological: She is alert and oriented to person, place, and time. She is not disoriented. No cranial nerve deficit.   Negative SLR    Skin: Skin is warm, dry and intact. She is not diaphoretic. No cyanosis. No pallor. Nails show no clubbing.   Psychiatric: She has a normal mood and affect. Her speech is normal.   Vitals reviewed.        Assessment:       1.  Pain of both sacroiliac joints     2.  Piriformis syndrome of left side     3.  Piriformis syndrome, right     4.  Lumbar radicular pain     5.  Neural foraminal stenosis of lumbar spine     6.  Chronic low back pain     7.  Spinal stenosis, lumbar  Plan:     Schedule bilateral SIJ/piriformis injection. Two weeks later bilateral L5 TFE     Informed consent has been obtained for procedure. Roselyn Reef Dishongis  on blood thinners. Medications to hold have been reviewed with patient.Blood thinners must be held with permission from your cardiologist or primary care physician. A letter is required to besent to PCP/Cardiologist regarding holding medications for procedure to decrease bleeding risk. Blood Thinners (antiplatelet/) medications that must be held prior to spinal cord stimulator trials, and implants, interlaminar epidurals, caudal epidurals, or transforaminal epidural injections:     Antiplatelets Aspirin, Effient, Plavix,Ticlid,  Brilinta, Savaysa for 3-5 days  Anticoagulant: Coumadin, Eliquis, Lovenox, Heparin, Pradaxa, Xarelto for 5-7 days.  All antiplatelets/anticoagulants must be held for 7 days for a spinal cord stimulator.

## 2014-10-10 NOTE — Interval H&P Note (Signed)
I have interviewed and examined the patient and reviewed the recent History and Physical.  There have been no changes to the recent H&P documentation. The surgical consent form has been signed.    Last anticoagulant medication use was:na    Premedication taken for contrast allergy?No    Valium taken for oral sedation? No    Outpatient Prescriptions Marked as Taking for the 10/10/14 encounter [facility] Encounter) with Willa Rough, MD   Medication Sig Dispense Refill   ??? acetaminophen (TYLENOL) 500 MG tablet Take 1,000 mg by mouth every 6 hours as needed for Pain     ??? clobetasol (TEMOVATE) 0.05 % cream Apply topically twice a week Apply topically 2 times daily.     ??? ibuprofen (ADVIL;MOTRIN) 600 MG tablet Take 1 tablet by mouth every 8 hours as needed for Pain 120 tablet 3   ??? atorvastatin (LIPITOR) 20 MG tablet Take 20 mg by mouth daily      ??? RA LORATADINE 10 MG tablet Take 10 mg by mouth daily   0   ??? Multiple Vitamins-Iron (MULTI-VITAMIN/IRON) TABS Take  by mouth Daily.     ??? CALCIUM CARBONATE-VIT D-MIN PO Take  by mouth 2 times daily.     ??? aspirin 81 MG tablet Take 81 mg by mouth daily.     ??? citalopram (CELEXA) 10 MG tablet Take 10 mg by mouth nightly      ??? LORazepam (ATIVAN) 0.5 MG tablet Take 0.5 mg by mouth. 1 tablet by mouth at bedtime PRN     ??? budesonide-formoterol (SYMBICORT) 160-4.5 MCG/ACT AERO Inhale 1 puff into the lungs 2 times daily.     ??? polyvinyl alcohol-povidone (HYPOTEARS) 1.4-0.6 % ophthalmic solution Place 1-2 drops into both eyes as needed.         The patient understands the planned operation and its associated risks and benefits and agrees to proceed.        Electronically signed by Willa Rough, MD on 10/10/2014 at 9:19 AM

## 2014-10-24 ENCOUNTER — Ambulatory Visit: Admit: 2014-10-24 | Payer: MEDICARE | Attending: Nurse Practitioner | Primary: Family Medicine

## 2014-10-24 DIAGNOSIS — G8929 Other chronic pain: Secondary | ICD-10-CM

## 2014-10-24 MED ORDER — GABAPENTIN 300 MG PO CAPS
300 MG | ORAL_CAPSULE | Freq: Every evening | ORAL | 3 refills | Status: DC
Start: 2014-10-24 — End: 2017-04-24

## 2014-10-24 MED ORDER — DIAZEPAM 5 MG PO TABS
5 MG | ORAL_TABLET | ORAL | 0 refills | Status: DC
Start: 2014-10-24 — End: 2017-04-24

## 2014-10-24 NOTE — Telephone Encounter (Signed)
10/24/14                                                                                                                                                                         To: Ethel Rana   [address] [place] / [place] [address]       From:  Josefine Class, MD   Interventional Pain Mgmt Physician, [facility]    Re: Medication Hiatus for Interventional Pain/Spine Procedure for Katie Juarez [id number]    Dear Ethel Rana                   I am recommending an injection for Roselyn Reef Minor to decrease their spine related pain. In order to perform this procedure, antiplatelet/anticoagulant medications will need to be held prior to the procedure.  Please respond with one of the options below if you feel that it is safe for [name] to hold this medication and return this letter to me.     Roselyn Reef Handler is taking: ASA    Medications that must be held prior to injections:     Antiplatelets Aspirin, Effient, Plavix,Ticlid,  Brilinta, Savaysa for 3-5 days   Anticoagulant: Coumadin, Eliquis, Lovenox, Heparin, Pradaxa, Xarelto for 5-7 days.   All antiplatelets/anticoagulants must be held for 7 days for a spinal cord stimulator..    Sincerely,       Electronically signed by [name], NP on 10/24/2014 at 1:46 PM    Josefine Class, MD      ??  I agree with holding this medication for the time described above.  ??  I agree with holding this medication for ____ days only.  ??  I agree with holding this medication for ____ days, however pt must be on another anticoagulant, _______________.  I  DO NOT agree with holding this medication

## 2014-10-24 NOTE — Progress Notes (Signed)
Subjective:      Patient ID: Katie Juarez is a 74 y.o. female.    HPI Here today for routine pain clinic recheck.     Frustrated no injections have worked.     Pain Assessment  Location of Pain: Leg  Location Modifiers: Left, Right  Severity of Pain: 6  Quality of Pain: Other (Comment) (shoots down buttocks to both legs )  Duration of Pain: Persistent  Frequency of Pain: Constant  Aggravating Factors: Walking, Standing, Other (Comment) (sitting )  Limiting Behavior: Some  Relieving Factors: Nsaids  Are there other pain locations you wish to document?: Yes    Allergies   Allergen Reactions   ??? Pcn [Penicillins] Hives   ??? Zithromax [Azithromycin] Hives       Outpatient Prescriptions Marked as Taking for the 10/24/14 encounter (Office Visit) with [name], NP   Medication Sig Dispense Refill   ??? gabapentin (NEURONTIN) 300 MG capsule Take 1 capsule by mouth nightly 30 capsule 3   ??? acetaminophen (TYLENOL) 500 MG tablet Take 1,000 mg by mouth every 6 hours as needed for Pain     ??? clobetasol (TEMOVATE) 0.05 % cream Apply topically twice a week Apply topically 2 times daily.     ??? ibuprofen (ADVIL;MOTRIN) 600 MG tablet Take 1 tablet by mouth every 8 hours as needed for Pain 120 tablet 3   ??? atorvastatin (LIPITOR) 20 MG tablet Take 20 mg by mouth daily      ??? NEXIUM 40 MG capsule Take 40 mg by mouth every morning (before breakfast)      ??? RA LORATADINE 10 MG tablet Take 10 mg by mouth daily   0   ??? Multiple Vitamins-Iron (MULTI-VITAMIN/IRON) TABS Take  by mouth Daily.     ??? CALCIUM CARBONATE-VIT D-MIN PO Take  by mouth 2 times daily.     ??? aspirin 81 MG tablet Take 81 mg by mouth daily.     ??? citalopram (CELEXA) 10 MG tablet Take 10 mg by mouth nightly      ??? LORazepam (ATIVAN) 0.5 MG tablet Take 0.5 mg by mouth. 1 tablet by mouth at bedtime PRN     ??? budesonide-formoterol (SYMBICORT) 160-4.5 MCG/ACT AERO Inhale 1 puff into the lungs 2 times daily.     ??? polyvinyl alcohol-povidone (HYPOTEARS) 1.4-0.6 %  ophthalmic solution Place 1-2 drops into both eyes as needed.         Past Medical History   Diagnosis Date   ??? Breast cancer ([place])    ??? DDD (degenerative disc disease)    ??? LBP (low back pain)    ??? 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 SS (spinal stenosis)        Past Surgical History   Procedure Laterality Date   ??? Hysterectomy     ??? Bladder suspension     ??? Cholecystectomy     ??? Breast lumpectomy     ??? Other surgical history  11/14/13, 06/23/11, 06/30/11, 07/12/11 12/29/11     L4/L5 IESI   ??? Other surgical history  12/17/13     L5/S1 IESI   ??? Other surgical history Bilateral 05/30/14     SIJ and piriformis   ??? Other surgical history Bilateral 09/16/2014     L3, L4, L5 Diagnostic Medial Branch Block   ??? Other surgical history Bilateral 09/26/2014     L5 TFE   ??? Other surgical history  10/10/2014  bil L5 TFE     Review of Systems   Constitutional: Positive for fatigue.   Respiratory: Negative for shortness of breath.    Cardiovascular: Positive for leg swelling. Negative for chest pain.   Gastrointestinal: Positive for constipation.   Genitourinary: Negative for difficulty urinating.   Musculoskeletal: Positive for arthralgias, back pain, joint swelling, myalgias and neck pain.   Neurological: Positive for weakness, numbness and headaches.   Psychiatric/Behavioral: Positive for sleep disturbance.       Objective:   Physical Exam   Constitutional: She is oriented to person, place, and time. Vital signs are normal. She appears well-developed and well-nourished. She is cooperative. No distress.   HENT:   Head: Normocephalic and atraumatic.   Cardiovascular: Normal rate and intact distal pulses.    Pulmonary/Chest: Effort normal. No accessory muscle usage. No respiratory distress.   Abdominal: Normal appearance.   Musculoskeletal:        Lumbar back: She exhibits decreased range of motion, tenderness and pain.   Neurological: She is alert and oriented to person, place, and time. She is not disoriented.  No cranial nerve deficit. GCS eye subscore is 4. GCS verbal subscore is 5. GCS motor subscore is 6.   Skin: Skin is warm, dry and intact. She is not diaphoretic. No cyanosis. No pallor. Nails show no clubbing.   Psychiatric: She has a normal mood and affect. Her speech is normal.   Vitals reviewed.      Assessment:      1. Chronic bilateral low back pain with bilateral sciatica    2. 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 Lumbar radicular pain          Plan:    Schedule caudal epidural    Start gabapentin 300mg  at bedtime    Informed consent has been obtained for procedure. Roselyn Reef Dishongis  on blood thinners. Medications to hold have been reviewed with patient.Blood thinners must be held with permission from your cardiologist or primary care physician. A letter is  required to besent to PCP/Cardiologist regarding holding medications for procedure to decrease bleeding risk. Blood Thinners (antiplatelet/) medications that must be held prior to spinal cord stimulator trials, and implants, interlaminar epidurals, caudal epidurals, or transforaminal epidural injections:    Antiplatelets Aspirin, Effient, Plavix,Ticlid,  Brilinta, Savaysa for 3-5 days  Anticoagulant: Coumadin, Eliquis, Lovenox, Heparin, Pradaxa, Xarelto for 5-7 days.  All antiplatelets/anticoagulants must be held for 7 days for a spinal cord stimulator.

## 2014-10-28 NOTE — Telephone Encounter (Signed)
Patient called and states that she hasn't taken her aspirin x 1 month.  Instructions mailed to patient.

## 2014-10-31 ENCOUNTER — Encounter: Admit: 2014-10-31 | Primary: Family Medicine

## 2014-10-31 ENCOUNTER — Encounter: Admit: 2014-10-31 | Attending: Physical Medicine & Rehabilitation | Primary: Family Medicine

## 2014-10-31 MED ORDER — BUFFERED LIDOCAINE 1%
Status: AC
Start: 2014-10-31 — End: ?

## 2014-10-31 MED ORDER — BUPIVACAINE HCL (PF) 0.5 % IJ SOLN
0.5 % | INTRAMUSCULAR | Status: AC
Start: 2014-10-31 — End: ?

## 2014-10-31 MED ORDER — DEXAMETHASONE SOD PHOSPHATE PF 10 MG/ML IJ SOLN
10 MG/ML | INTRAMUSCULAR | Status: AC
Start: 2014-10-31 — End: ?

## 2014-10-31 MED FILL — DEXAMETHASONE SOD PHOSPHATE PF 10 MG/ML IJ SOLN: 10 MG/ML | INTRAMUSCULAR | Qty: 3

## 2014-10-31 MED FILL — BUPIVACAINE HCL (PF) 0.5 % IJ SOLN: 0.5 % | INTRAMUSCULAR | Qty: 30

## 2014-10-31 MED FILL — BUFFERED LIDOCAINE 1%: Qty: 20

## 2014-10-31 NOTE — Op Note (Signed)
CAUDAL STEROID INJECTION    10/31/14    Surgeon: Willa Rough, MD    Pre-operative Diagnosis:   Patient Active Problem List   Diagnosis Code   ??? SS (spinal stenosis) M48.00   ??? 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 Lumbar facet joint syndrome ([place]) M12.88   ??? 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 Lumbosacral stenosis M48.07   ??? 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 Pain of both sacroiliac joints M53.3   ??? Piriformis syndrome of left side G57.02   ??? Piriformis syndrome G57.00   ??? Lumbar radicular pain M54.16   ??? 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 Chronic low back pain M54.5, G89.29   ??? Spinal stenosis, lumbar M48.06       Post-operative Diagnosis: Same    Assistants: none    INDICATION:  This is a 74 y.o. female patient with pain in the Back, left leg, right leg.?? Previous treatment and examination findings are noted in the H&P and previous clinic notes.??Caudal epidural injection has been requested for diagnostic and therapeutic reasons.    Conservative treatment was ineffective i.e.: ice, NSAIDS, rest, narcotic medication, chiropractic care, physical therapy and message therapy.    Patient is unable to perform the following ADL's: ambulating, grooming, sitting and standing     Pain Assessment: 0-10  Pain Level: 6     Pain Orientation: Left  Pain Location: Back, Hip  Pain Descriptors: Aching    Last Plain films: 12/13/13    EXAMINATION:  1. Caudal radiculogram/epidurogram.   2. Caudal epidural anesthetic injection.   3. Caudal epidural steroid injection.    CONSENT: Written consent was obtained from the patient on preprinted consent form after explaining the procedure, indications, potential complications and outcomes. Alternative treatments were also discussed.    DISCUSSION: The patient was sterilely prepped and draped in the usual fashion in the   prone position. ???Time out??? was verified for correct patient, side, level and procedure.    No  conscious sedation was performed during the procedure.?? The patient remained awake and conversed throughout the procedure.?? The patient underwent continuous EKG, pulse oximetry and blood pressure monitoring independently by a trained observer, as well as by a physician.    Under image-intensifier control, a 22 gauge needle x 5 inch spinal needle was guided successfully into the sacral hiatus employing a posterior midline approach. Needle aspiration was negative for heme or CSF.     Under image-intensifier control, a  16 gauge 19mm Epimed RX COUDE epidural needle was guided successfully into the sacral hiatus employing a posterior midline approach. Needle aspiration was negative for heme or CSF.     Next, a radiopaque Epimed BREVI-XL 19-gauge catheter was inserted through the 16-gauge spinal needle and gradually advanced in the midline into the sacral canal  and advance the catheter proximally with minimal patient discomfort.?? The catheter was advanced to the level of the S1.    PROCEDURES #1 to #3:  Installation of .5 mL of Omnipaque 240 contrast medium opacified the spinal nerve and demonstrated contiguous flow into the epidural space. No vascular spread was noted. Digital subtraction was not employed to evaluate for vascular spread.] The patient was monitored for any untoward reaction to contrast medium before proceeding with procedure #2. The patient did not report pain reproduction in a concordant distribution.    Following needle position verification, a test dose of 44mL of sterile saline lidocaine  was administered and patient monitored for any adverse effects.Then,  84mL of , dexamethasone (Decadron 10 mg/m) was instilled into the epidural space and the patient's response was again monitored.  Finally,  1 ml of 0.5% bupivacaine was then instilled. The patient's response was again monitored.    The spinal needle was removed, and the patient's pain response, gait pattern, sensorimotor examination and range of  motion were examined.    The patient tolerated the procedure well and without complications and was noted to be in stable condition prior to discharge from the procedure center with discharge instructions.    IMPRESSIONS:  1. Caudal epidurogram, epidural anesthesia and epidural steroid injection procedures accomplished without incident.  with the assistance of a catheter placed into the epidural space through the sacral approach     EBL: no blood loss    SPECIMEN: none    RECOMMENDATIONS:  1. Complete and return ???Post-Procedure Pain and Activity Diary.???   2. Contact the [facility] for symptom exacerbation, fever or unusual symptoms.   3. Post-procedure care according to verbal and written discharge instructions    POST-PROCEDURE SACRAL EPIDUROGRAPHY INTERPRETATION:    EXAMINATION: AP, lateral views.    FLUORO TIME: 23 seconds    DISCUSSION: Spot views of the spine reveal normal alignment and segmentation.  Spinal needle is positioned at the level of S2 though the sacral hiatus. Contrast spreads and outlines the epidural space. The epidurogram reveals excellent contrast flow. Visualized spine reveals Degenerative Joint Disease- S1 .Soft tissues reveal no abnormalities.    IMPRESSION: Caudal epidurogram/epineurogram reveals satisfactory needle position and contrast spread.     Electronically signed by Willa Rough, MD on 10/31/2014 at 1:42 PM

## 2014-10-31 NOTE — Other (Signed)
REAL TIME OUT TIME 1346

## 2014-10-31 NOTE — H&P (Signed)
[name], NP Nurse Practitioner Signed  Progress Notes Date of Service: 10/24/2014  1:00 PM   Related encounter: Office Visit from 10/24/2014 in [facility]      Expand All Collapse All    Hide copied text  Hover for attribution information  Subjective:        Patient ID: Katie Juarez is a 74 y.o. female.     HPI Here today for routine pain clinic recheck.      Frustrated no injections have worked.      Pain Assessment  Location of Pain: Leg  Location Modifiers: Left, Right  Severity of Pain: 6  Quality of Pain: Other (Comment) (shoots down buttocks to both legs )  Duration of Pain: Persistent  Frequency of Pain: Constant  Aggravating Factors: Walking, Standing, Other (Comment) (sitting )  Limiting Behavior: Some  Relieving Factors: Nsaids  Are there other pain locations you wish to document?: Yes          Allergies    Allergen  Reactions    ???  Pcn [Penicillins]  Hives    ???  Zithromax [Azithromycin]  Hives           Active Medications           Outpatient Prescriptions Marked as Taking for the 10/24/14 encounter (Office Visit) with [name], NP    Medication  Sig  Dispense  Refill    ???  gabapentin (NEURONTIN) 300 MG capsule  Take 1 capsule by mouth nightly  30 capsule  3    ???  acetaminophen (TYLENOL) 500 MG tablet  Take 1,000 mg by mouth every 6 hours as needed for Pain        ???  clobetasol (TEMOVATE) 0.05 % cream  Apply topically twice a week Apply topically 2 times daily.        ???  ibuprofen (ADVIL;MOTRIN) 600 MG tablet  Take 1 tablet by mouth every 8 hours as needed for Pain  120 tablet  3    ???  atorvastatin (LIPITOR) 20 MG tablet  Take 20 mg by mouth daily         ???  NEXIUM 40 MG capsule  Take 40 mg by mouth every morning (before breakfast)         ???  RA LORATADINE 10 MG tablet  Take 10 mg by mouth daily     0    ???  Multiple Vitamins-Iron (MULTI-VITAMIN/IRON) TABS  Take by mouth Daily.        ???  CALCIUM CARBONATE-VIT D-MIN PO  Take by mouth 2 times daily.        ???  aspirin 81 MG tablet   Take 81 mg by mouth daily.        ???  citalopram (CELEXA) 10 MG tablet  Take 10 mg by mouth nightly         ???  LORazepam (ATIVAN) 0.5 MG tablet  Take 0.5 mg by mouth. 1 tablet by mouth at bedtime PRN        ???  budesonide-formoterol (SYMBICORT) 160-4.5 MCG/ACT AERO  Inhale 1 puff into the lungs 2 times daily.        ???  polyvinyl alcohol-povidone (HYPOTEARS) 1.4-0.6 % ophthalmic solution  Place 1-2 drops into both eyes as needed.                  Past Medical History         Past Medical History  Diagnosis  Date    ???  Breast cancer ([place])      ???  DDD (degenerative disc disease)      ???  LBP (low back pain)      ???  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  SS (spinal stenosis)                Past Surgical History           Past Surgical History    Procedure  Laterality  Date    ???  Hysterectomy        ???  Bladder suspension        ???  Cholecystectomy        ???  Breast lumpectomy        ???  Other surgical history    11/14/13, 06/23/11, 06/30/11, 07/12/11 12/29/11        L4/L5 IESI    ???  Other surgical history    12/17/13        L5/S1 IESI    ???  Other surgical history  Bilateral  05/30/14        SIJ and piriformis    ???  Other surgical history  Bilateral  09/16/2014        L3, L4, L5 Diagnostic Medial Branch Block    ???  Other surgical history  Bilateral  09/26/2014        L5 TFE    ???  Other surgical history    10/10/2014        bil L5 TFE          Review of Systems   Constitutional: Positive for fatigue.   Respiratory: Negative for shortness of breath.   Cardiovascular: Positive for leg swelling. Negative for chest pain.   Gastrointestinal: Positive for constipation.   Genitourinary: Negative for difficulty urinating.   Musculoskeletal: Positive for arthralgias, back pain, joint swelling, myalgias and neck pain.   Neurological: Positive for weakness, numbness and headaches.   Psychiatric/Behavioral: Positive for sleep disturbance.         Objective:    Physical Exam   Constitutional: She is oriented to person,  place, and time. Vital signs are normal. She appears well-developed and well-nourished. She is cooperative. No distress.   HENT:    Head: Normocephalic and atraumatic.   Cardiovascular: Normal rate and intact distal pulses.   Pulmonary/Chest: Effort normal. No accessory muscle usage. No respiratory distress.   Abdominal: Normal appearance.   Musculoskeletal:   Lumbar back: She exhibits decreased range of motion, tenderness and pain.   Neurological: She is alert and oriented to person, place, and time. She is not disoriented. No cranial nerve deficit. GCS eye subscore is 4. GCS verbal subscore is 5. GCS motor subscore is 6.   Skin: Skin is warm, dry and intact. She is not diaphoretic. No cyanosis. No pallor. Nails show no clubbing.   Psychiatric: She has a normal mood and affect. Her speech is normal.   Vitals reviewed.        Assessment:       1.  Chronic bilateral low back pain with bilateral sciatica     2.  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  Lumbar radicular pain            Plan:     Schedule caudal epidural     Start gabapentin 300mg  at bedtime     Informed  consent has been obtained for procedure. Roselyn Reef Dishongis  on blood thinners. Medications to hold have been reviewed with patient.Blood thinners must be held with permission from your cardiologist or primary care physician. A letter is  required to besent to PCP/Cardiologist regarding holding medications for procedure to decrease bleeding risk. Blood Thinners (antiplatelet/) medications that must be held prior to spinal cord stimulator trials, and implants, interlaminar epidurals, caudal epidurals, or transforaminal epidural injections:    Antiplatelets Aspirin, Effient, Plavix,Ticlid, Brilinta, Savaysa for 3-5 days  Anticoagulant: Coumadin, Eliquis, Lovenox, Heparin, Pradaxa, Xarelto for 5-7 days.  All antiplatelets/anticoagulants must be held for 7 days for a spinal cord stimulator.                                [name], NP Nurse Practitioner Signed  Progress Notes Date of Service: 09/24/2014  9:23 AM   Related encounter: Office Visit from 09/24/2014 in [facility]      Expand All Collapse All    Hide copied text  Hover for attribution information  Subjective:        Patient ID: Katie Juarez is a 74 y.o. female.     HPI Post diagnostic MBB, no relief. Feels it made her pain worse. When she first started injections, L4/L5 IESI effective. At that time, she was not have radicular pain     Pain Assessment  Location of Pain: Leg  Location Modifiers: Left, Right  Severity of Pain: 7  Quality of Pain: Aching  Duration of Pain: Persistent  Frequency of Pain: Constant  Aggravating Factors: Other (Comment), Standing, Walking (shoots from buttocks into legs )  Limiting Behavior: Some  Relieving Factors: Rest, Ice, Heat  Are there other pain locations you wish to document?: Yes          Allergies    Allergen  Reactions    ???  Pcn [Penicillins]  Hives    ???  Zithromax [Azithromycin]  Hives           Active Medications           Outpatient Prescriptions Marked as Taking for the 09/24/14 encounter (Office Visit) with [name], NP    Medication  Sig  Dispense  Refill    ???  acetaminophen (TYLENOL) 500 MG tablet  Take 1,000 mg by mouth every 6 hours as needed for Pain        ???  clobetasol (TEMOVATE) 0.05 % cream  Apply topically twice a week Apply topically 2 times daily.        ???  ibuprofen (ADVIL;MOTRIN) 600 MG tablet  Take 1 tablet by mouth every 8 hours as needed for Pain  120 tablet  3    ???  atorvastatin (LIPITOR) 20 MG tablet  Take 20 mg by mouth daily         ???  NEXIUM 40 MG capsule  Take 40 mg by mouth every morning (before breakfast)         ???  RA LORATADINE 10 MG tablet  Take 10 mg by mouth daily     0    ???  Multiple Vitamins-Iron (MULTI-VITAMIN/IRON) TABS  Take by mouth Daily.        ???  CALCIUM CARBONATE-VIT D-MIN PO  Take by mouth 2 times daily.        ???  aspirin 81 MG tablet  Take 81 mg by mouth daily.        ???  citalopram (CELEXA) 10 MG tablet  Take 10 mg by mouth nightly         ???  LORazepam (ATIVAN) 0.5 MG tablet  Take 0.5 mg by mouth. 1 tablet by mouth at bedtime PRN        ???  budesonide-formoterol (SYMBICORT) 160-4.5 MCG/ACT AERO  Inhale 1 puff into the lungs 2 times daily.        ???  polyvinyl alcohol-povidone (HYPOTEARS) 1.4-0.6 % ophthalmic solution  Place 1-2 drops into both eyes as needed.                  Past Medical History         Past Medical History    Diagnosis  Date    ???  Breast cancer ([place])      ???  DDD (degenerative disc disease)      ???  LBP (low back pain)      ???  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  SS (spinal stenosis)                Past Surgical History           Past Surgical History    Procedure  Laterality  Date    ???  Hysterectomy        ???  Bladder suspension        ???  Cholecystectomy        ???  Breast lumpectomy        ???  Other surgical history    11/14/13, 06/23/11, 06/30/11, 07/12/11 12/29/11        L4/L5 IESI    ???  Other surgical history    12/17/13        L5/S1 IESI    ???  Other surgical history  Bilateral  05/30/14        SIJ and piriformis    ???  Other surgical history  Bilateral  09/16/2014        L3, L4, L5 Diagnostic Medial Branch Block          Review of Systems   Constitutional: Positive for fatigue.   Respiratory: Negative for shortness of breath.   Cardiovascular: Positive for leg swelling. Negative for chest pain.   Gastrointestinal: Positive for constipation.   Genitourinary: Negative for difficulty urinating.   Musculoskeletal: Positive for arthralgias, back pain, joint swelling, myalgias and neck pain.   Neurological: Positive for weakness, numbness and headaches.   Psychiatric/Behavioral: Positive for sleep disturbance.         Objective:    Physical Exam   Constitutional: She is oriented to person, place, and time. Vital signs are normal. She appears well-developed and well-nourished. She is cooperative. No distress.   HENT:    Head: Normocephalic and atraumatic.    Cardiovascular: Normal rate and intact distal pulses.   Pulmonary/Chest: Effort normal. No respiratory distress.   Musculoskeletal:   Lumbar back: She exhibits decreased range of motion, tenderness and pain.   Neurological: She is alert and oriented to person, place, and time. She is not disoriented. No cranial nerve deficit.   Skin: Skin is warm, dry and intact. She is not diaphoretic. No cyanosis. No pallor. Nails show no clubbing.   Psychiatric: She has a normal mood and affect. Her speech is normal.   Vitals reviewed.        Assessment:       1.  Lumbar radicular pain  2.  Neural foraminal stenosis of lumbar spine     3.  Chronic bilateral low back pain with bilateral sciatica            Plan:     Schedule bilateral L5 TFE x2     Informed consent has been obtained for procedure. Roselyn Reef Dishongis  on blood thinners. Medications to hold have been reviewed with patient.Blood thinners must be held with permission from your cardiologist or primary care physician. A letter is  required to besent to PCP/Cardiologist regarding holding medications for procedure to decrease bleeding risk. Blood Thinners (antiplatelet/) medications that must be held prior to spinal cord stimulator trials, and implants, interlaminar epidurals, caudal epidurals, or transforaminal epidural injections:    Antiplatelets Aspirin, Effient, Plavix,Ticlid, Brilinta, Savaysa for 3-5 days  Anticoagulant: Coumadin, Eliquis, Lovenox, Heparin, Pradaxa, Xarelto for 5-7 days.  All antiplatelets/anticoagulants must be held for 7 days for a spinal cord stimulator.                                [name], NP Nurse Practitioner Signed  Progress Notes Date of Service: 09/24/2014  9:23 AM   Related encounter: Office Visit from 09/24/2014 in [facility]      Expand All Collapse All    Hide copied text  Hover for attribution information  Subjective:        Patient ID: Katie Juarez is a 74 y.o. female.     HPI Post diagnostic MBB, no  relief. Feels it made her pain worse. When she first started injections, L4/L5 IESI effective. At that time, she was not have radicular pain     Pain Assessment  Location of Pain: Leg  Location Modifiers: Left, Right  Severity of Pain: 7  Quality of Pain: Aching  Duration of Pain: Persistent  Frequency of Pain: Constant  Aggravating Factors: Other (Comment), Standing, Walking (shoots from buttocks into legs )  Limiting Behavior: Some  Relieving Factors: Rest, Ice, Heat  Are there other pain locations you wish to document?: Yes          Allergies    Allergen  Reactions    ???  Pcn [Penicillins]  Hives    ???  Zithromax [Azithromycin]  Hives           Active Medications           Outpatient Prescriptions Marked as Taking for the 09/24/14 encounter (Office Visit) with [name], NP    Medication  Sig  Dispense  Refill    ???  acetaminophen (TYLENOL) 500 MG tablet  Take 1,000 mg by mouth every 6 hours as needed for Pain        ???  clobetasol (TEMOVATE) 0.05 % cream  Apply topically twice a week Apply topically 2 times daily.        ???  ibuprofen (ADVIL;MOTRIN) 600 MG tablet  Take 1 tablet by mouth every 8 hours as needed for Pain  120 tablet  3    ???  atorvastatin (LIPITOR) 20 MG tablet  Take 20 mg by mouth daily         ???  NEXIUM 40 MG capsule  Take 40 mg by mouth every morning (before breakfast)         ???  RA LORATADINE 10 MG tablet  Take 10 mg by mouth daily     0    ???  Multiple  Vitamins-Iron (MULTI-VITAMIN/IRON) TABS  Take by mouth Daily.        ???  CALCIUM CARBONATE-VIT D-MIN PO  Take by mouth 2 times daily.        ???  aspirin 81 MG tablet  Take 81 mg by mouth daily.        ???  citalopram (CELEXA) 10 MG tablet  Take 10 mg by mouth nightly         ???  LORazepam (ATIVAN) 0.5 MG tablet  Take 0.5 mg by mouth. 1 tablet by mouth at bedtime PRN        ???  budesonide-formoterol (SYMBICORT) 160-4.5 MCG/ACT AERO  Inhale 1 puff into the lungs 2 times daily.        ???  polyvinyl alcohol-povidone (HYPOTEARS) 1.4-0.6 % ophthalmic solution   Place 1-2 drops into both eyes as needed.                  Past Medical History         Past Medical History    Diagnosis  Date    ???  Breast cancer ([place])      ???  DDD (degenerative disc disease)      ???  LBP (low back pain)      ???  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  SS (spinal stenosis)                Past Surgical History           Past Surgical History    Procedure  Laterality  Date    ???  Hysterectomy        ???  Bladder suspension        ???  Cholecystectomy        ???  Breast lumpectomy        ???  Other surgical history    11/14/13, 06/23/11, 06/30/11, 07/12/11 12/29/11        L4/L5 IESI    ???  Other surgical history    12/17/13        L5/S1 IESI    ???  Other surgical history  Bilateral  05/30/14        SIJ and piriformis    ???  Other surgical history  Bilateral  09/16/2014        L3, L4, L5 Diagnostic Medial Branch Block          Review of Systems   Constitutional: Positive for fatigue.   Respiratory: Negative for shortness of breath.   Cardiovascular: Positive for leg swelling. Negative for chest pain.   Gastrointestinal: Positive for constipation.   Genitourinary: Negative for difficulty urinating.   Musculoskeletal: Positive for arthralgias, back pain, joint swelling, myalgias and neck pain.   Neurological: Positive for weakness, numbness and headaches.   Psychiatric/Behavioral: Positive for sleep disturbance.         Objective:    Physical Exam   Constitutional: She is oriented to person, place, and time. Vital signs are normal. She appears well-developed and well-nourished. She is cooperative. No distress.   HENT:    Head: Normocephalic and atraumatic.   Cardiovascular: Normal rate and intact distal pulses.   Pulmonary/Chest: Effort normal. No respiratory distress.   Musculoskeletal:   Lumbar back: She exhibits decreased range of motion, tenderness and pain.   Neurological: She is alert and oriented to person, place, and time. She is not disoriented. No cranial nerve deficit.   Skin: Skin is  warm,  dry and intact. She is not diaphoretic. No cyanosis. No pallor. Nails show no clubbing.   Psychiatric: She has a normal mood and affect. Her speech is normal.   Vitals reviewed.        Assessment:       1.  Lumbar radicular pain     2.  Neural foraminal stenosis of lumbar spine     3.  Chronic bilateral low back pain with bilateral sciatica            Plan:     Schedule bilateral L5 TFE x2     Informed consent has been obtained for procedure. Roselyn Reef Dishongis  on blood thinners. Medications to hold have been reviewed with patient.Blood thinners must be held with permission from your cardiologist or primary care physician. A letter is  required to besent to PCP/Cardiologist regarding holding medications for procedure to decrease bleeding risk. Blood Thinners (antiplatelet/) medications that must be held prior to spinal cord stimulator trials, and implants, interlaminar epidurals, caudal epidurals, or transforaminal epidural injections:    Antiplatelets Aspirin, Effient, Plavix,Ticlid, Brilinta, Savaysa for 3-5 days  Anticoagulant: Coumadin, Eliquis, Lovenox, Heparin, Pradaxa, Xarelto for 5-7 days.  All antiplatelets/anticoagulants must be held for 7 days for a spinal cord stimulator.                                Willa Rough, MD Physician Signed  Progress Notes Date of Service: 09/10/2014  8:18 AM   Related encounter: Office Visit from 09/10/2014 in [facility]      Expand All Collapse All    Hide copied text  Hover for attribution information  PAIN MANAGEMENT FOLLOW-UP NOTE  09/10/14     CHIEF COMPLAINT: This is a 74 y.o. female patient who returns to the Pain Management Clinic with a history of No chief complaint on file.        PAIN HPI: Katie Juarez returns today for reevaluation. Since the visit, the patient reports that the pain is not changed. Seems that S-I and ESIS are now not effective for B Gluteal and Posterior upper thigh pain     ANALGESIA:   Are your Current Pain medication (s) helping  to decrease pain? No.    Current Pain score:       ADVERSE AFFECTS:   Medication Side Effects: No.     ACTIVITY:  Are you able to be more active with your pain medications? No        ABERRANT BEHAVIORS SINCE LAST VISIT? No     Review of Systems   Constitutional: Positive for fatigue.   HENT: Negative.   Eyes: Negative.   Respiratory: Negative.   Cardiovascular: Negative.   Gastrointestinal: Negative for constipation and diarrhea.   Endocrine: Negative.   Genitourinary: Negative for difficulty urinating and flank pain.   Musculoskeletal: Positive for back pain and myalgias.   Skin: Negative.   Allergic/Immunologic: Negative.   Neurological: Positive for weakness and numbness.   Hematological: Negative.   Psychiatric/Behavioral: Positive for sleep disturbance.              Allergies    Allergen  Reactions    ???  Pcn [Penicillins]  Hives    ???  Zithromax [Azithromycin]  Hives           Medications Prior to Visit           Outpatient Medications  Prior to Visit    Medication  Sig  Dispense  Refill    ???  ibuprofen (ADVIL;MOTRIN) 600 MG tablet  Take 1 tablet by mouth every 8 hours as needed for Pain  120 tablet  3    ???  atorvastatin (LIPITOR) 20 MG tablet  Take 20 mg by mouth daily         ???  NEXIUM 40 MG capsule  Take 40 mg by mouth every morning (before breakfast)         ???  RA LORATADINE 10 MG tablet  Take 10 mg by mouth daily     0    ???  Multiple Vitamins-Iron (MULTI-VITAMIN/IRON) TABS  Take by mouth Daily.        ???  CALCIUM CARBONATE-VIT D-MIN PO  Take by mouth 2 times daily.        ???  aspirin 81 MG tablet  Take 81 mg by mouth daily.        ???  citalopram (CELEXA) 10 MG tablet  Take 10 mg by mouth nightly         ???  LORazepam (ATIVAN) 0.5 MG tablet  Take 0.5 mg by mouth. 1 tablet by mouth at bedtime PRN        ???  budesonide-formoterol (SYMBICORT) 160-4.5 MCG/ACT AERO  Inhale 1 puff into the lungs 2 times daily.        ???  polyvinyl alcohol-povidone (HYPOTEARS) 1.4-0.6 % ophthalmic solution  Place 1-2 drops into both eyes  as needed.        ???  vitamin B-12 (CYANOCOBALAMIN) 1000 MCG tablet  Take 1,000 mcg by mouth daily           No facility-administered medications prior to visit.               Past Medical History         Past Medical History    Diagnosis  Date    ???  Breast cancer ([place])      ???  DDD (degenerative disc disease)      ???  LBP (low back pain)      ???  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  SS (spinal stenosis)                Past Surgical History           Past Surgical History    Procedure  Laterality  Date    ???  Hysterectomy        ???  Bladder suspension        ???  Cholecystectomy        ???  Breast lumpectomy        ???  Other surgical history    11/14/13, 06/23/11, 06/30/11, 07/12/11 12/29/11        L4/L5 IESI    ???  Other surgical history    12/17/13        L5/S1 IESI    ???  Other surgical history  Bilateral  05/30/14        SIJ and piriformis             Family and Social History reviewed in the electronic medical record.     Imaging: Reviewed available imaging in our system with the patient.  No results found.     Objective:       Physical Exam:   Vitals        Vitals:  09/10/14 0812    Weight:  153 lb 9.6 oz (69.7 kg)                Physical Exam   Constitutional: She is oriented to person, place, and time. She appears well-developed and well-nourished. No distress.   HENT:    Head: Normocephalic and atraumatic.   Right Ear: External ear normal. No decreased hearing is noted.   Left Ear: External ear normal. No decreased hearing is noted.    Nose: Nose normal.    Mouth/Throat: Oropharynx is clear and moist and mucous membranes are normal.   Eyes: Conjunctivae and lids are normal. No scleral icterus.   Neck: Phonation normal.   Cardiovascular:   No BLE edema present   Pulmonary/Chest: Effort normal. No accessory muscle usage. No respiratory distress.   Abdominal: Normal appearance.   Genitourinary:   Genitourinary Comments: deferred   Neurological: She is alert and oriented to person, place, and time.    Skin: Skin is warm, dry and intact. No rash noted. She is not diaphoretic. No cyanosis or erythema. No pallor.   Psychiatric: She has a normal mood and affect. Her speech is normal and behavior is normal.      Back Exam      Comments: Mild [place] B L3,4,5  Slump +  [place] +            Assessment:    This is a 74 y.o. female patient with:     Diagnosis:    No diagnosis found.     Medical Comorbidities:  As listed in the patient's past medical and surgical history     Functional Limitations:    Pain limits function and quality of life.      Plan:       B L3,4,5 MBB Diagnostic  Meds:    Controlled Substances Monitoring: Pt educated about the risks of taking opiates, including increased sedation, constipation, slowed breathing, tolerance, dependence, and addiction.           New Prescriptions      No medications on file           Encounter Medications     No orders of the defined types were placed in this encounter.      No orders of the defined types were placed in this encounter.        No Follow-up on file.     The patient expressed understanding of the above assessment and plan.     Total time spent face to face with patient was 20 minutes in which 50% or more of the time was spent in counseling, education about risk and benefits of the above plan, and coordination of care.                      Willa Rough, MD Physician Signed General Surgery H&P 05/30/2014  8:03 AM   Related encounter: Admission (Discharged) from OP Visit from 05/30/2014 in [place], NP  Nurse Practitioner  Signed    Progress Notes  05/09/2014 11:27 AM    Related encounter: Office Visit from 05/09/2014 in [facility]        Expand All Collapse All   Subjective:         Patient ID: Katie Juarez is a 74 y.o. female.    HPI Underwent IESI's previously in 11/2013 and  12/2013, no longer effective. Low back pain, pain in buttocks as well as posterior thigh. Worse with sitting.       Allergies     Allergen   Reactions     ???   Pcn [Penicillins]        ???   Zithromax [Azithromycin]                Active Medications      Outpatient Prescriptions Marked as Taking for the 05/09/14 encounter (Office Visit) with [name], NP     Medication   Sig   Dispense   Refill     ???   sulfamethoxazole-trimethoprim (BACTRIM DS) 800-160 MG per tablet   Take 1 tablet by mouth 2 times daily for 7 days   14 tablet   0     ???   vitamin B-12 (CYANOCOBALAMIN) 1000 MCG tablet   Take 1,000 mcg by mouth daily           ???   ibuprofen (ADVIL;MOTRIN) 600 MG tablet   Take 1 tablet by mouth every 8 hours as needed for Pain   120 tablet   3     ???   atorvastatin (LIPITOR) 20 MG tablet   Take 20 mg by mouth daily            ???   NEXIUM 40 MG capsule   Take 40 mg by mouth every morning (before breakfast)            ???   RA LORATADINE 10 MG tablet   Take 10 mg by mouth daily       0     ???   Multiple Vitamins-Iron (MULTI-VITAMIN/IRON) TABS   Take  by mouth Daily.           ???   CALCIUM CARBONATE-VIT D-MIN PO   Take  by mouth 2 times daily.           ???   aspirin 81 MG tablet   Take 81 mg by mouth daily.           ???   citalopram (CELEXA) 10 MG tablet   Take 10 mg by mouth. 1/2 tablet by mouth at bedtime           ???   LORazepam (ATIVAN) 0.5 MG tablet   Take 0.5 mg by mouth. 1 tablet by mouth at bedtime PRN           ???   budesonide-formoterol (SYMBICORT) 160-4.5 MCG/ACT AERO   Inhale 1 puff into the lungs 2 times daily.           ???   polyvinyl alcohol-povidone (HYPOTEARS) 1.4-0.6 % ophthalmic solution   Place 1-2 drops into both eyes as needed.           ???   nitrofurantoin, macrocrystal-monohydrate, (MACROBID) 100 MG capsule   Take 100 mg by mouth 2 times daily.                       Past Medical History      Past Medical History     Diagnosis   Date     ???   Breast cancer ([place])        ???   SS (spinal stenosis)        ???   DDD (degenerative disc disease)        ???   LBP (low back pain)        ???  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   Hysterectomy           ???   Bladder suspension           ???   Cholecystectomy           ???   Breast lumpectomy           ???   Other surgical history      11/14/13, 06/23/11, 06/30/11, 07/12/11 12/29/11           L4/L5 IESI     ???   Other surgical history      12/17/13           L5/S1 IESI             Review of Systems    Constitutional: Positive for fatigue.   Respiratory: Negative for shortness of breath.     Cardiovascular: Positive for leg swelling. Negative for chest pain.    Gastrointestinal: Positive for constipation.   Genitourinary: Negative for difficulty urinating.    Musculoskeletal: Positive for myalgias, back pain, joint swelling, arthralgias and neck pain.   Neurological: Positive for weakness, numbness and headaches.   Psychiatric/Behavioral: Positive for sleep disturbance.        Objective:     Physical Exam   Constitutional: She is oriented to person, place, and time. Vital signs are normal. She appears well-developed and well-nourished. She is cooperative. No distress.    HENT:    Head: Normocephalic and atraumatic.    Cardiovascular: Normal rate and intact distal pulses.     Pulmonary/Chest: Effort normal. No respiratory distress.   Musculoskeletal:        Lumbar back: She exhibits decreased range of motion (flexion induces pain), tenderness, bony tenderness (bilateral PSIS) and pain.       Back:    TYPE OF EXAM: MRI lumbar spine without contrast  DATE OF EXAM: 12/13/13  COMPARISON: 12/21/10  CLINICAL INDICATION: Degeneration of intervertebral disc of the lumbar region, lumbar spinal stenosis  FINDINGS: 2 mm retrolisthesis of L5 on S1, 2 mm anterolisthesis of L4 on L5, and 2 mm retrolisthesis of L3 on L4, no pars interarticularis defect evident. Conus at the level of LI and appears unremarkable. Mild degenerative disease within the lumbar   spine. Cystic structures posterior to L4/5 facet  joints and adjacent to the spinous process of L5 likely related to synovial cysts, largest is on the right measuring 1.6 cm. Fluid within L4/5 facet joints may be on a degenerative or inflammatory basis. 1  cm cyst lower pole left kidney unchanged.   L5/S1 disc bulge asymmetric to the left. Facet hypertrophic degenerative changes and ligamentum flavum hypertrophy are present. No central canal stenosis. Mild narrowing right neural foramen. Severe narrowing left neural foramen, facet hypertrophic   degenerative changes and disc contact exiting nerve root within the left neural foramen and disc contacts exiting nerve root just lateral to the left neural foramen.  L4/5 disc bulge along with facet hypertrophic degenerative changes. Severe central canal stenosis anterior posterior dimension of the thecal sac measures 6 mm. Mild narrowing bilateral neural foramina.  L3/4 left neural foraminal/lateral disc protrusion superimposed on disc bulge. Facet hypertrophic degenerative changes and ligamentum flavum hypertrophy  present. No central canal stenosis. Mild narrowing right neural foramen. Moderate narrowing left   neural foramen, disc contacts exiting nerve root in the distal left neural foramen and just lateral to the left neural foramen.  L2/3 mild disc bulge slightly asymmetric to the left. Facet hypertrophic degenerative changes and ligamentum flavum hypertrophy present. No central canal stenosis. Right neural foramen patent. Mild narrowing left neural foramen.  L1/2 no disc herniation, central canal stenosis, or neural foramina compromise. Mild facet hypertrophic degenerative changes are present.  IMPRESSION: 1. Degenerative changes result in severe central canal stenosis at L4/5 along with varying degrees of neural foraminal narrowing within the lumbar spine. See above for description of findings at each level. Overall, findings are similar when compared to   prior exam.          Neurological: She is alert and  oriented to person, place, and time. She is not disoriented. No cranial nerve deficit.   Negative SLR    Skin: Skin is warm, dry and intact. She is not diaphoretic. No cyanosis. No pallor. Nails show no clubbing.   Psychiatric: She has a normal mood and affect. Her speech is normal.    Vitals reviewed.        Assessment:        1.   Pain of both sacroiliac joints      2.   Piriformis syndrome of left side      3.   Piriformis syndrome, right      4.   Lumbar radicular pain      5.   Neural foraminal stenosis of lumbar spine      6.   Chronic low back pain      7.   Spinal stenosis, lumbar                        Plan:      Schedule bilateral SIJ/piriformis injection. Two weeks later bilateral L5 TFE     Informed consent has been obtained for procedure. Roselyn Reef Dishongis  on blood thinners. Medications to hold have been reviewed with patient.Blood thinners must be held with permission from your cardiologist or primary care physician. A letter is required to besent to PCP/Cardiologist regarding holding medications for procedure to decrease bleeding risk. Blood Thinners (antiplatelet/) medications that must be held prior to spinal cord stimulator trials, and implants, interlaminar epidurals, caudal epidurals, or transforaminal epidural injections:     Antiplatelets Aspirin, Effient, Plavix,Ticlid,  Brilinta, Savaysa for 3-5 days  Anticoagulant: Coumadin, Eliquis, Lovenox, Heparin, Pradaxa, Xarelto for 5-7 days.  All antiplatelets/anticoagulants must be held for 7 days for a spinal cord stimulator.                                                                          [name], NP Nurse Practitioner Signed  Progress Notes 05/09/2014 11:27 AM   Related encounter: Office Visit from 05/09/2014 in [facility]      Expand All Collapse All   Subjective:        Patient ID: Katie Juarez is a 74 y.o. female.    HPI Underwent IESI's previously in 11/2013 and 12/2013, no longer effective.  Low back pain, pain in  buttocks as well as posterior thigh. Worse with sitting.      Allergies    Allergen  Reactions    ???  Pcn [Penicillins]      ???  Zithromax [Azithromycin]             Active Medications    Outpatient Prescriptions Marked as Taking for the 05/09/14 encounter (Office Visit) with [name], NP    Medication  Sig  Dispense  Refill    ???  sulfamethoxazole-trimethoprim (BACTRIM DS) 800-160 MG per tablet  Take 1 tablet by mouth 2 times daily for 7 days  14 tablet  0    ???  vitamin B-12 (CYANOCOBALAMIN) 1000 MCG tablet  Take 1,000 mcg by mouth daily        ???  ibuprofen (ADVIL;MOTRIN) 600 MG tablet  Take 1 tablet by mouth every 8 hours as needed for Pain  120 tablet  3    ???  atorvastatin (LIPITOR) 20 MG tablet  Take 20 mg by mouth daily         ???  NEXIUM 40 MG capsule  Take 40 mg by mouth every morning (before breakfast)         ???  RA LORATADINE 10 MG tablet  Take 10 mg by mouth daily     0    ???  Multiple Vitamins-Iron (MULTI-VITAMIN/IRON) TABS  Take  by mouth Daily.        ???  CALCIUM CARBONATE-VIT D-MIN PO  Take  by mouth 2 times daily.        ???  aspirin 81 MG tablet  Take 81 mg by mouth daily.        ???  citalopram (CELEXA) 10 MG tablet  Take 10 mg by mouth. 1/2 tablet by mouth at bedtime        ???  LORazepam (ATIVAN) 0.5 MG tablet  Take 0.5 mg by mouth. 1 tablet by mouth at bedtime PRN        ???  budesonide-formoterol (SYMBICORT) 160-4.5 MCG/ACT AERO  Inhale 1 puff into the lungs 2 times daily.        ???  polyvinyl alcohol-povidone (HYPOTEARS) 1.4-0.6 % ophthalmic solution  Place 1-2 drops into both eyes as needed.        ???  nitrofurantoin, macrocrystal-monohydrate, (MACROBID) 100 MG capsule  Take 100 mg by mouth 2 times daily.                  Past Medical History    Past Medical History    Diagnosis  Date    ???  Breast cancer ([place])      ???  SS (spinal stenosis)      ???  DDD (degenerative disc disease)      ???  LBP (low back pain)      ???  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  Hysterectomy        ???  Bladder suspension        ???  Cholecystectomy        ???  Breast lumpectomy        ???  Other surgical history    11/14/13, 06/23/11, 06/30/11, 07/12/11 12/29/11        L4/L5 IESI    ???  Other surgical history    12/17/13        L5/S1 IESI           Review of Systems   Constitutional: Positive for fatigue.   Respiratory: Negative for shortness of breath.    Cardiovascular: Positive for leg swelling. Negative for chest pain.   Gastrointestinal: Positive for constipation.   Genitourinary: Negative for difficulty urinating.   Musculoskeletal: Positive for myalgias, back pain, joint swelling, arthralgias and neck pain.   Neurological: Positive for weakness, numbness and headaches.   Psychiatric/Behavioral: Positive for sleep disturbance.        Objective:    Physical Exam   Constitutional: She is oriented to person, place, and time. Vital signs are normal. She appears well-developed and well-nourished. She is cooperative. No distress.   HENT:    Head: Normocephalic and atraumatic.   Cardiovascular: Normal rate and intact distal pulses.    Pulmonary/Chest: Effort normal. No respiratory distress.   Musculoskeletal:        Lumbar back: She exhibits decreased range of motion (flexion induces pain), tenderness, bony tenderness (bilateral PSIS) and pain.       Back:    TYPE OF EXAM: MRI lumbar spine without contrast  DATE OF EXAM: 12/13/13  COMPARISON: 12/21/10  CLINICAL INDICATION: Degeneration of intervertebral disc of the lumbar region, lumbar spinal stenosis  FINDINGS: 2 mm retrolisthesis of L5 on S1, 2 mm anterolisthesis of L4 on L5, and 2 mm retrolisthesis of L3 on L4, no pars interarticularis defect evident. Conus at the level of LI and appears unremarkable. Mild degenerative disease within the lumbar   spine. Cystic structures posterior to L4/5 facet joints and adjacent to the spinous process of L5 likely related to synovial cysts, largest is on  the right measuring 1.6 cm. Fluid within L4/5 facet joints may be on a degenerative or inflammatory basis. 1  cm cyst lower pole left kidney unchanged.   L5/S1 disc bulge asymmetric to the left. Facet hypertrophic degenerative changes and ligamentum flavum hypertrophy are present. No central canal stenosis. Mild narrowing right neural foramen. Severe narrowing left neural foramen, facet hypertrophic   degenerative changes and disc contact exiting nerve root within the left neural foramen and disc contacts exiting nerve root just lateral to the left neural foramen.  L4/5 disc bulge along with facet hypertrophic degenerative changes. Severe central canal stenosis anterior posterior dimension of the thecal sac measures 6 mm. Mild narrowing bilateral neural foramina.  L3/4 left neural foraminal/lateral disc protrusion superimposed on disc bulge. Facet hypertrophic degenerative changes and ligamentum flavum hypertrophy present. No central canal stenosis. Mild narrowing right neural foramen. Moderate narrowing left   neural foramen, disc contacts exiting nerve root in the distal left neural foramen and just lateral to the left neural foramen.  L2/3 mild disc bulge slightly asymmetric to the left. Facet hypertrophic degenerative changes and ligamentum flavum hypertrophy present. No central canal stenosis. Right neural foramen patent. Mild narrowing left neural foramen.  L1/2 no disc herniation, central canal stenosis, or neural foramina compromise. Mild facet hypertrophic degenerative changes are present.  IMPRESSION: 1. Degenerative changes result in severe central canal stenosis at L4/5 along with varying degrees of neural foraminal narrowing within the lumbar spine. See above for description of findings at each level. Overall, findings are similar when compared to   prior exam.          Neurological: She is alert and oriented to person, place, and time. She is not disoriented. No cranial nerve  deficit.   Negative SLR    Skin: Skin is warm, dry and intact. She is not diaphoretic. No cyanosis. No pallor. Nails show no clubbing.   Psychiatric: She has a normal mood and affect. Her speech is normal.   Vitals reviewed.        Assessment:       1.  Pain of both sacroiliac joints     2.  Piriformis syndrome of left side     3.  Piriformis syndrome, right     4.  Lumbar radicular pain     5.  Neural foraminal stenosis of lumbar spine     6.  Chronic low back pain     7.  Spinal stenosis, lumbar                      Plan:     Schedule bilateral SIJ/piriformis injection. Two weeks later bilateral L5 TFE     Informed consent has been obtained for procedure. Roselyn Reef Dishongis  on blood thinners. Medications to hold have been reviewed with patient.Blood thinners must be held with permission from your cardiologist or primary care physician. A letter is required to besent to PCP/Cardiologist regarding holding medications for procedure to decrease bleeding risk. Blood Thinners (antiplatelet/) medications that must be held prior to spinal cord stimulator trials, and implants, interlaminar epidurals, caudal epidurals, or transforaminal epidural injections:     Antiplatelets Aspirin, Effient, Plavix,Ticlid,  Brilinta, Savaysa for 3-5 days  Anticoagulant: Coumadin, Eliquis, Lovenox, Heparin, Pradaxa, Xarelto for 5-7 days.  All antiplatelets/anticoagulants must be held for 7 days for a spinal cord stimulator.

## 2014-10-31 NOTE — Interval H&P Note (Signed)
I have interviewed and examined the patient and reviewed the recent History and Physical.  There have been no changes to the recent H&P documentation. The surgical consent form has been signed.    Last anticoagulant medication use was:na    Premedication taken for contrast allergy?No    Valium taken for oral sedation? No    Outpatient Prescriptions Marked as Taking for the 10/31/14 encounter [facility] Encounter) with Willa Rough, MD   Medication Sig Dispense Refill   ??? gabapentin (NEURONTIN) 300 MG capsule Take 1 capsule by mouth nightly 30 capsule 3   ??? acetaminophen (TYLENOL) 500 MG tablet Take 1,000 mg by mouth every 6 hours as needed for Pain     ??? clobetasol (TEMOVATE) 0.05 % cream Apply topically twice a week Apply topically 2 times daily.     ??? ibuprofen (ADVIL;MOTRIN) 600 MG tablet Take 1 tablet by mouth every 8 hours as needed for Pain 120 tablet 3   ??? atorvastatin (LIPITOR) 20 MG tablet Take 20 mg by mouth daily      ??? NEXIUM 40 MG capsule Take 40 mg by mouth every morning (before breakfast)      ??? Multiple Vitamins-Iron (MULTI-VITAMIN/IRON) TABS Take  by mouth Daily.     ??? aspirin 81 MG tablet Take 81 mg by mouth daily.     ??? citalopram (CELEXA) 10 MG tablet Take 10 mg by mouth nightly      ??? LORazepam (ATIVAN) 0.5 MG tablet Take 0.5 mg by mouth. 1 tablet by mouth at bedtime PRN     ??? budesonide-formoterol (SYMBICORT) 160-4.5 MCG/ACT AERO Inhale 1 puff into the lungs 2 times daily.     ??? polyvinyl alcohol-povidone (HYPOTEARS) 1.4-0.6 % ophthalmic solution Place 1-2 drops into both eyes as needed.         The patient understands the planned operation and its associated risks and benefits and agrees to proceed.        Electronically signed by Willa Rough, MD on 10/31/2014 at 1:41 PM

## 2014-10-31 NOTE — Other (Signed)
Patient Acct Nbr:  [id number]  Primary AUTH/CERT:    [place] Name:   [place] Name:    Primary Insurance Group Number:    Primary Insurance Plan Type: [facility]  Primary Insurance Policy Number:  [id number]    Secondary AUTH/CERT:    [place] Name:   [facility]  Secondary Insurance Plan Name:  [place]  Secondary Insurance Group Number:  [id number]  Secondary Insurance Plan Type: [employer]  Secondary Insurance Policy Number:  [id number]    Tertiary AUTH/CERT:    [place] Name:   [facility] Insurance Plan Name:  [facility] Insurance Group Number:    [facility] Insurance Plan Type: [employer] Policy Number:  [id number]

## 2014-11-24 NOTE — Telephone Encounter (Signed)
Patient called c/o back and hip being right back to the way it was. She stated that she doesn't think that the injections are helping and that it's the pain is being caused by hip pain. She stated that she's been taking more OTC Ibuprofen and Tylenol to try an help with this, but it's only causing GI Upset. Patient stated that they are leaving soon for [place] and asked what else she can do/try? Please advise.  [phone]

## 2014-11-28 ENCOUNTER — Encounter

## 2014-11-28 NOTE — Telephone Encounter (Signed)
Patient called and asked for a referral to Dr Chancy Milroy at [facility] for a surgical consult since the injections are not helping.  [phone]) [phone]

## 2014-11-28 NOTE — Telephone Encounter (Signed)
Pt aware referral has been made.  Aware that Dr. Marella Bile office will be contacting her.

## 2014-11-28 NOTE — Telephone Encounter (Signed)
Pt

## 2014-11-28 NOTE — Telephone Encounter (Signed)
Referral in computer, please refer

## 2015-02-16 NOTE — Telephone Encounter (Signed)
error 

## 2016-05-20 LAB — SURGICAL PATHOLOGY REPORT

## 2016-07-19 LAB — SURGICAL PATHOLOGY REPORT

## 2017-04-24 ENCOUNTER — Ambulatory Visit: Admit: 2017-04-24 | Discharge: 2017-04-24 | Payer: MEDICARE | Attending: Family Medicine | Primary: Family Medicine

## 2017-04-24 ENCOUNTER — Inpatient Hospital Stay: Payer: MEDICARE | Primary: Family Medicine

## 2017-04-24 DIAGNOSIS — E785 Hyperlipidemia, unspecified: Secondary | ICD-10-CM

## 2017-04-24 DIAGNOSIS — R300 Dysuria: Secondary | ICD-10-CM

## 2017-04-24 LAB — URINALYSIS WITH REFLEX TO CULTURE
Bilirubin Urine: NEGATIVE
Glucose, Ur: NEGATIVE
Ketones, Urine: NEGATIVE
Leukocyte Esterase, Urine: NEGATIVE
Nitrite, Urine: NEGATIVE
Protein, UA: NEGATIVE
Specific Gravity, UA: 1.025 (ref 1.010–1.025)
Urobilinogen, Urine: NORMAL
pH, UA: 6.5 — ABNORMAL HIGH (ref 5.0–6.0)

## 2017-04-24 LAB — MICROSCOPIC URINALYSIS
Casts UA: 0 /LPF (ref 0–2)
Epithelial Cells UA: 0 /HPF (ref 0–5)
RBC, UA: 5 /HPF (ref 0–4)
WBC, UA: 0 /HPF (ref 0–4)

## 2017-04-24 MED ORDER — ATORVASTATIN CALCIUM 20 MG PO TABS
20 MG | ORAL_TABLET | Freq: Every day | ORAL | 3 refills | Status: DC
Start: 2017-04-24 — End: 2018-04-16

## 2017-04-24 MED ORDER — OMEPRAZOLE 40 MG PO CPDR
40 MG | ORAL_CAPSULE | Freq: Every day | ORAL | 3 refills | Status: DC
Start: 2017-04-24 — End: 2017-12-13

## 2017-04-24 MED ORDER — LORAZEPAM 0.5 MG PO TABS
0.5 MG | ORAL_TABLET | Freq: Every evening | ORAL | 0 refills | Status: DC | PRN
Start: 2017-04-24 — End: 2017-08-29

## 2017-04-24 MED ORDER — CITALOPRAM HYDROBROMIDE 10 MG PO TABS
10 MG | ORAL_TABLET | Freq: Every evening | ORAL | 3 refills | Status: DC
Start: 2017-04-24 — End: 2017-09-13

## 2017-04-24 NOTE — Progress Notes (Signed)
[facility]  [address] [place] [address]  [phone]      Katie Juarez is a 77 y.o. female who presents today for her medical conditions/complaints as noted below.  Katie Juarez is c/o of New Patient (previous pt of Dr Albertina Parr)      HPI:     Pt here today to establish - was most recently seeing Dr. Albertina Parr.    Pt has seen Dr. Laban Emperor in the past for recurrent UTI's; has not seen him in the past 2 years.  Still gets UTI's "every once in awhile"; most recently in the past 3-4 months.  Thinks she may have one now - having some dysuria when she bears down to void.  Denies hematuria.    Using Symbicort BID for COPD - stable on this.  Will use Albuterol inhaler as needed, maybe a couple times per month, mostly at night.    Taking Celexa 10 mg nightly for depression/anxiety; also uses Lorazepam 0.5 mg nightly as well.  Does not use any Lorazepam during the day.  Has been on these meds for "quite some time".  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  Not currently seeing a Psychiatrist.    Taking Lipitor 10 mg daily for HLD - stable; due to re-check her labs now.  Also taking ASA 81 mg daily.    Taking Omeprazole 40 mg daily for GERD - still has a cough which brings up a clear foamy sputum.  No heartburn, but will get soreness in her throat.  Has the head of her bed elevated.    Using Flonase once daily at night.    Taking Colace 100 mg once daily for constipation.    Taking Thrive multi-vitamin capsule once daily, then drinks shake once daily and wears a patch daily.  Can tell a positive difference in her energy level since starting this 3 weeks ago.    Lives at home with her husband.  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  Denies alcohol or recreational drug use.  + h/o tobacco use - used to smoke 2 ppd until 1994 when she quit with  patches.        Past Medical History:   Diagnosis Date   ??? Breast cancer ([place]) 1999    in remission   ??? DDD (degenerative disc disease)    ??? Large bowel obstruction ([place]) 02/2015    [facility]   ??? LBP (low back pain)    ??? SS (spinal stenosis)       Past Surgical History:   Procedure Laterality Date   ??? APPENDECTOMY  1956   ??? BLADDER SUSPENSION  1990   ??? BREAST LUMPECTOMY Right 1999   ??? CHOLECYSTECTOMY  1990   ??? HYSTERECTOMY  1980    Done for endometriosis; done in [place]   ??? OTHER SURGICAL HISTORY  11/14/13, 06/23/11, 06/30/11, 07/12/11 12/29/11    L4/L5 IESI   ??? OTHER SURGICAL HISTORY  12/17/13    L5/S1 IESI   ??? OTHER SURGICAL HISTORY Bilateral 05/30/14    SIJ and piriformis   ??? OTHER SURGICAL HISTORY Bilateral 09/16/2014    L3, L4, L5 Diagnostic Medial Branch Block   ??? OTHER SURGICAL HISTORY Bilateral 09/26/2014    L5 TFE   ??? OTHER SURGICAL HISTORY  10/10/2014  bil L5 TFE   ??? OTHER SURGICAL HISTORY  10/31/2014    caudal   ??? TONSILLECTOMY  1946     Family History   Problem Relation Age of Onset   ??? Other Mother         ALS - diagnosed at age 85   ??? Stroke Father         age 40   ??? Cancer Sister         pt thinks it was liver   ??? Cancer Brother         leukemia   ??? Diabetes Maternal Grandfather    ??? Other Brother         drowned   ??? Other Brother         died at age 2; think due to spinal meningitis   ??? Lupus Sister    ??? Alzheimer's Disease Sister    ??? Dementia Sister      Social History     Tobacco Use   ??? Smoking status: Former Smoker   ??? Smokeless tobacco: Never Used   ??? Tobacco comment: quit 25 years ago 1990   Substance Use Topics   ??? Alcohol use: No     Alcohol/week: 0.0 oz      Current Outpatient Medications   Medication Sig Dispense Refill   ??? Multiple Vitamins-Minerals (THRIVE FOR LIFE WOMENS) TABS Take by mouth     ??? albuterol sulfate HFA (VENTOLIN HFA) 108 (90 Base) MCG/ACT inhaler Ventolin HFA 90 mcg/actuation aerosol inhaler   Inhale 2 puffs every 6 hours by inhalation route as needed.   ASTHMA     ???  fluticasone (FLONASE) 50 MCG/ACT nasal Juarez      ??? docusate sodium (COLACE) 100 MG capsule Take 100 mg by mouth 2 times daily     ??? atorvastatin (LIPITOR) 20 MG tablet Take 1 tablet by mouth daily 90 tablet 3   ??? citalopram (CELEXA) 10 MG tablet Take 1 tablet by mouth nightly 90 tablet 3   ??? omeprazole (PRILOSEC) 40 MG delayed release capsule Take 1 capsule by mouth daily 90 capsule 3   ??? LORazepam (ATIVAN) 0.5 MG tablet Take 1 tablet by mouth nightly as needed for Anxiety for up to 90 days. 90 tablet 0   ??? aspirin 81 MG tablet Take 81 mg by mouth daily.     ??? polyvinyl alcohol-povidone (HYPOTEARS) 1.4-0.6 % ophthalmic solution Place 1-2 drops into both eyes as needed.     ??? budesonide-formoterol (SYMBICORT) 160-4.5 MCG/ACT AERO Inhale 1 puff into the lungs 2 times daily 3 Inhaler 3     No current facility-administered medications for this visit.      Allergies   Allergen Reactions   ??? Morphine Other (See Comments)   ??? Pcn [Penicillins] Hives   ??? Zithromax [Azithromycin] Hives       Health Maintenance   Topic Date Due   ??? DTaP/Tdap/Td vaccine (1 - Tdap) 06/09/1958   ??? Shingles Vaccine (1 of 2) 06/08/1989   ??? DEXA (modify frequency per FRAX score)  06/08/2004   ??? Flu vaccine  Completed   ??? Pneumococcal 65+ years Vaccine  Completed       Subjective:      Review of Systems   Respiratory: Negative for shortness of breath.    Cardiovascular: Positive for chest pain (intermittent, does not last long; unsure if it is related to anxiety).   Gastrointestinal: Positive for constipation.   Genitourinary: Positive for  dysuria. Negative for hematuria.   Neurological: Negative for dizziness.   Psychiatric/Behavioral: The patient is nervous/anxious (stable).        Objective:     Vitals:    04/24/17 1016   BP: 116/70   Site: Right Upper Arm   Position: Sitting   Cuff Size: Medium Adult   Pulse: 76   SpO2: 92%   Weight: 144 lb (65.3 kg)   Height: 5\' 4"  (1.626 m)     Physical Exam   Constitutional: She is oriented to person, place,  and time. She appears well-developed and well-nourished. No distress.   HENT:   Head: Normocephalic and atraumatic.   Eyes: Conjunctivae are normal.   Neck: Neck supple.   Cardiovascular: Normal rate, regular rhythm and normal heart sounds.   Pulmonary/Chest: Effort normal and breath sounds normal. No respiratory distress.   Abdominal: Soft. Bowel sounds are normal. She exhibits no distension. There is no tenderness.   Musculoskeletal: She exhibits no edema.   Neurological: She is alert and oriented to person, place, and time.   Skin: Skin is warm and dry.   Psychiatric: She has a normal mood and affect.       Assessment:       Diagnosis Orders   1. Hyperlipidemia, unspecified hyperlipidemia type  atorvastatin (LIPITOR) 20 MG tablet   2. Anxiety  citalopram (CELEXA) 10 MG tablet    LORazepam (ATIVAN) 0.5 MG tablet   3. Gastroesophageal reflux disease, esophagitis presence not specified  omeprazole (PRILOSEC) 40 MG delayed release capsule   4. Dysuria  Urinalysis Reflex to Culture         Plan:      Return in about 4 months (around 08/24/2017) for Follow-up.    Orders Placed This Encounter   Procedures   ??? Urinalysis Reflex to Culture     Standing Status:   Future     Number of Occurrences:   1     Standing Expiration Date:   04/25/2018     Order Specific Question:   SPECIFY(EX-CATH,MIDSTREAM,CYSTO,ETC)?     Answer:   ccms     Orders Placed This Encounter   Medications   ??? atorvastatin (LIPITOR) 20 MG tablet     Sig: Take 1 tablet by mouth daily     Dispense:  90 tablet     Refill:  3   ??? citalopram (CELEXA) 10 MG tablet     Sig: Take 1 tablet by mouth nightly     Dispense:  90 tablet     Refill:  3   ??? omeprazole (PRILOSEC) 40 MG delayed release capsule     Sig: Take 1 capsule by mouth daily     Dispense:  90 capsule     Refill:  3   ??? LORazepam (ATIVAN) 0.5 MG tablet     Sig: Take 1 tablet by mouth nightly as needed for Anxiety for up to 90 days.     Dispense:  90 tablet     Refill:  0       Patient given educational  materials - see patient instructions.  Discussed use, benefit, and side effects of prescribed medications.  All patient questions answered.  Pt voiced understanding.  Reviewed health maintenance.            Electronically signed by [name], DO on 04/30/2017 at 11:27 PM

## 2017-04-25 ENCOUNTER — Encounter

## 2017-04-25 MED ORDER — BUDESONIDE-FORMOTEROL FUMARATE 160-4.5 MCG/ACT IN AERO
Freq: Two times a day (BID) | RESPIRATORY_TRACT | 3 refills | Status: DC
Start: 2017-04-25 — End: 2018-05-07

## 2017-05-16 ENCOUNTER — Ambulatory Visit: Admit: 2017-05-16 | Discharge: 2017-05-16 | Payer: MEDICARE | Attending: Family | Primary: Family Medicine

## 2017-05-16 DIAGNOSIS — J3089 Other allergic rhinitis: Secondary | ICD-10-CM

## 2017-05-16 MED ORDER — CETIRIZINE HCL 10 MG PO TABS
10 MG | ORAL_TABLET | Freq: Every day | ORAL | 0 refills | Status: DC
Start: 2017-05-16 — End: 2017-09-13

## 2017-05-16 MED ORDER — FLUTICASONE PROPIONATE 50 MCG/ACT NA SUSP
50 MCG/ACT | Freq: Every day | NASAL | 0 refills | Status: DC
Start: 2017-05-16 — End: 2019-01-09

## 2017-05-16 NOTE — Progress Notes (Signed)
[facility]  [address]  [place], [place] [address]  [phone]      Raylena Cornwall Dishongis a 77 y.o. female who is c/o of Cough (chronic, occasionally has trouble swallowing food)      HPI:     Patient in today for an acute visit for symptomsof cough and some trouble with swallowing.   She does take Prilosec for her GERD. She states that she has been having trouble swallowing. She states that that last episode was yesterday but it has happened several times. She states yesterday it was chili. The chili had hamburger and sausage in it. She states that the food went down after a moment but she coughed up clear liquid.   She does not know what foods cause her to have swallowing issues. She does states that cold fluids cause her throat to close up.      Cough   This is a chronic problem. The current episode started more than 1 year ago. The problem has been gradually worsening. The problem occurs every few hours. The cough is productive of sputum (clear bubbly sputum). Associated symptoms include headaches, postnasal drip, rhinorrhea and a sore throat. Pertinent negatives include no fever, heartburn, nasal congestion or shortness of breath. Associated symptoms comments: She states that her cough will wake her up, no burning in her throat. Treatments tried: she takes flonase on occasion but not routinely. There is no history of environmental allergies.        Subjective:     Review of Systems   Constitutional: Negative.  Negative for fever.   HENT: Positive for postnasal drip, rhinorrhea, sore throat and trouble swallowing.    Respiratory: Positive for cough. Negative for shortness of breath.    Gastrointestinal: Negative for heartburn.   Allergic/Immunologic: Negative for environmental allergies.   Neurological: Positive for headaches.       Objective:     Vitals:    05/16/17 1206   BP: 130/72   Site: Left Upper Arm   Position: Sitting   Cuff Size: Medium Adult   Pulse: 70    Resp: 16   Temp: 98.7 F (37.1 C)   TempSrc: Tympanic   SpO2: 94%   Weight: 146 lb 9.6 oz (66.5 kg)   Height: 5\' 4"  (1.626 m)     Physical Exam   Constitutional: She is oriented to person, place, and time. Vital signs are normal. She appears well-developed and well-nourished. She is cooperative.   HENT:   Head: Normocephalic and atraumatic.   Right Ear: External ear normal.   Left Ear: External ear normal.   Nose: Nose normal.   Mouth/Throat: Oropharynx is clear and moist.   Eyes: Pupils are equal, round, and reactive to light. Conjunctivae and EOM are normal.   Neck: Normal range of motion.   Cardiovascular: Normal rate and regular rhythm.   Pulmonary/Chest: Effort normal and breath sounds normal.   Musculoskeletal: Normal range of motion.   Neurological: She is alert and oriented to person, place, and time.   Skin: Skin is warm and dry.   Psychiatric: She has a normal mood and affect. Her behavior is normal. Judgment and thought content normal.   Nursing note and vitals reviewed.      Assessment:       Diagnosis Orders   1. Environmental and seasonal allergies  [place] [facility] [place], Maisie Fus, MD, Otolaryngology, [place]    cetirizine (ZYRTEC ALLERGY) 10 MG tablet    fluticasone (  FLONASE) 50 MCG/ACT nasal spray   2. Dysphagia, unspecified type  [place] - Fayrene Helper, Maisie Fus, MD, Otolaryngology, [place]    FL ESOPHAGRAM       Plan:     Orders Placed This Encounter   Procedures   . FL ESOPHAGRAM     Standing Status:   Future     Standing Expiration Date:   05/17/2018     Order Specific Question:   Reason for exam:     Answer:   trouble swallowing   . [place] - Fayrene Helper, Maisie Fus, MD, Otolaryngology, [place]     Referral Priority:   Routine     Referral Type:   Eval and Treat     Referral Reason:   Specialty Services Required     Referred to Provider:   Riley Kill, MD     Requested Specialty:   Otolaryngology     Number of Visits Requested:   1     Advised to take her medications including flonase daily. Advised to keep  food log and episodes or swallowing difficulties. Will call with results of swallow study and will have her follow up with ENT.    Discussed use, benefit, and side effects of prescribed medications.All patient questions answered.  Pt voiced understanding. Follow up PRN.      Electronicallysigned by  Melburn Hake, APRN - CNP on 05/16/2017 at 12:51 PM

## 2017-05-16 NOTE — Patient Instructions (Signed)
Patient Education        Managing Your Allergies: Care Instructions  Your Care Instructions    Managing your allergies is an important part of staying healthy. Your doctor will help you find out what may be causing the allergies. Common causes of allergy symptoms are house dust and dust mites, animal dander, mold, and pollen.  As soon as you know what triggers your symptoms, try to reduce your exposure to your triggers. This can help prevent allergy symptoms, asthma, and other health problems.  Ask your doctor about allergy medicine or immunotherapy. These treatments may help reduce or prevent allergy symptoms.  Follow-up care is a key part of your treatment and safety. Be sure to make and go to all appointments, and call your doctor if you are having problems. It's also a good idea to know your test results and keep a list of the medicines you take.  How can you care for yourself at home?  ?? If you think that dust or dust mites are causing your allergies:  ? Wash sheets, pillowcases, and other bedding every week in hot water.  ? Use airtight, dust-proof covers for pillows, duvets, and mattresses. Avoid plastic covers, because they tend to tear quickly and do not "breathe." Wash according to the instructions.  ? Remove extra blankets and pillows that you don't need.  ? Use blankets that are machine-washable.  ? Don't use home humidifiers. They can help mites live longer.  ?? Use air-conditioning. Change or clean all filters every month. Keep windows closed. Use high-efficiency air filters. Don't use window or attic fans, which draw dust into the air.  ?? If you're allergic to pet dander, keep pets outside or, at the very least, out of your bedroom. Old carpet and cloth-covered furniture can hold a lot of animal dander. You may need to replace them.  ?? Look for signs of cockroaches. Use cockroach baits to get rid of them. Then clean your home well.  ?? If you're allergic to mold, don't keep indoor plants, because  molds can grow in soil. Get rid of furniture, rugs, and drapes that smell musty. Check for mold in the bathroom.  ?? If you're allergic to pollen, stay inside when pollen counts are high.  ?? Don't smoke or let anyone else smoke in your house. Don't use fireplaces or wood-burning stoves. Avoid paint fumes, perfumes, and other strong odors.  When should you call for help?  Give an epinephrine shot if:  ?? ?? You think you are having a severe allergic reaction.   ??After giving an epinephrine shot call 911, even if you feel better.  ??Call 911 if:  ?? ?? You have symptoms of a severe allergic reaction. These may include:  ? Sudden raised, red areas (hives) all over your body.  ? Swelling of the throat, mouth, lips, or tongue.  ? Trouble breathing.  ? Passing out (losing consciousness). Or you may feel very lightheaded or suddenly feel weak, confused, or restless.   ?? ?? You have been given an epinephrine shot, even if you feel better.   ??Call your doctor now or seek immediate medical care if:  ?? ?? You have symptoms of an allergic reaction, such as:  ? A rash or hives (raised, red areas on the skin).  ? Itching.  ? Swelling.  ? Belly pain, nausea, or vomiting.   ??Watch closely for changes in your health, and be sure to contact your doctor if:  ?? ?? Your   allergies get worse.   ?? ?? You need help controlling your allergies.   ?? ?? You have questions about allergy testing.   ?? ?? You do not get better as expected.   Where can you learn more?  Go to [email or web address] and sign in to your MyChart account. Enter L249 in the [place] box to learn more about "Managing Your Allergies: Care Instructions."     If you do not have an account, please click on the "Sign Up Now" link.  Current as of: July 06, 2016  Content Version: 11.9  ?? 2006-2018 [employer]. Care instructions adapted under license by [facility]. If you have questions about a medical condition or this instruction, always ask  your healthcare professional. [place] any warranty or liability for your use of this information.         Patient Education        Learning About the Diet for Swallowing Problems  What are swallowing problems?  Difficulty swallowing is also called dysphagia (say "dis-FAY-jee-uh"). It is most often a sign of a problem with your throat or esophagus. This is the tube that moves food and liquids from the back of your mouth to your stomach.  Trouble swallowing can occur when the muscles and nerves that move food through the throat and esophagus are not working right. To help you swallow food, your doctor or speech therapist may advise a special dysphagia diet for you.  Why is a special diet important?  A dysphagia diet can help you handle some problems that can occur when it's hard to swallow food and liquids easily. These problems can include:  ?? Malnutrition. This means you aren't getting enough healthy foods to keep your body working well.  ?? Dehydration. This means you aren't getting enough liquids to keep your body healthy.  ?? Aspiration. This means that food, liquid, or saliva goes down your windpipe (trachea) into your lungs, instead of down your esophagus to your stomach. This can lead to aspiration pneumonia, which is an inflammation of the lungs.  What is the dysphagia diet?  In the dysphagia diet, you change the foods you eat and the liquids you drink to make it easier to swallow them. You may:  ?? Change the texture of the foods you eat. Your doctor or speech therapist may advise you to eat one of these types of foods:  ? Solid, soft foods that have been cut up into small pieces. Extra gravy or sauce can help make these foods easier to swallow.  ? Semi-solid foods, like ground meats or fruits and vegetables that are mashed with a fork. These foods require some chewing. Extra gravy or sauce is often served.  ? Foods that are the texture of pudding. You don't have to chew these foods.  Examples include mashed potatoes with gravy; yogurt; pudding; and pureed meats, fruits, and vegetables.  ?? Thicken the liquids you drink. Your doctor or speech therapist will tell you what kind of thickener to use and how thick to make the liquids.  ? Nectar-thick liquids leave a thin coating when they are poured from a spoon.  ? Honey-thick liquids drip slowly when they are poured from a spoon.  ? Pudding-thick liquids do not pour from a spoon.  Your speech therapist will help you learn exercises to train your muscles to work together so you can swallow. You may also need to learn how to position your body or how to put food  in your mouth to be able to swallow better.  Some people have severe trouble swallowing and can't get enough food and liquids. In those cases, the doctor can insert a feeding tube so the person gets enough nutrients.  Follow-up care is a key part of your treatment and safety. Be sure to make and go to all appointments, and call your doctor if you are having problems. It's also a good idea to know your test results and keep a list of the medicines you take.  Where can you learn more?  Go to [email or web address] and sign in to your MyChart account. Enter [phone] in the [place] box to learn more about "Learning About the Diet for Swallowing Problems."     If you do not have an account, please click on the "Sign Up Now" link.  Current as of: October 02, 2016  Content Version: 11.9  ?? 2006-2018 [employer]wise, [employer]. Care instructions adapted under license by [facility]. If you have questions about a medical condition or this instruction, always ask your healthcare professional. [place] any warranty or liability for your use of this information.

## 2017-05-22 ENCOUNTER — Telehealth

## 2017-05-22 NOTE — Telephone Encounter (Signed)
Patient is scheduled for an esophogram tomorrow. Per radiologist, it needs to be a modified due to patient symptoms, I spoke with the patient to inform her not to come tomorrow and I would contact her with a new time and date after I have coordinated with speech therapy.     Please place fluoro modified barium swallow order.   Thanks!

## 2017-05-23 ENCOUNTER — Ambulatory Visit: Payer: MEDICARE | Primary: Family Medicine

## 2017-05-23 NOTE — Telephone Encounter (Signed)
Order pending [facility] signature. Thank you!

## 2017-05-25 ENCOUNTER — Inpatient Hospital Stay: Admit: 2017-05-25 | Payer: MEDICARE | Attending: Speech-Language Pathologist | Primary: Family Medicine

## 2017-05-25 ENCOUNTER — Inpatient Hospital Stay: Admit: 2017-05-25 | Payer: MEDICARE | Primary: Family Medicine

## 2017-05-25 DIAGNOSIS — R1310 Dysphagia, unspecified: Secondary | ICD-10-CM

## 2017-05-25 MED ORDER — BARIUM SULFATE 98 % PO SUSR
98 % | Freq: Once | ORAL | Status: AC | PRN
Start: 2017-05-25 — End: 2017-05-25
  Administered 2017-05-25: 19:00:00 140 mL via ORAL

## 2017-05-25 NOTE — Progress Notes (Signed)
SPEECH THERAPY  MODIFIED BARIUM SWALLOW STUDY    [x]  [facility]  []  Inpatient- [facility]    Patient: Katie Juarez  Date of Birth: December 16, 1940  Gender: female  MRN:  [id number]  Referring Physician: Otelia Santee, CNP  Diagnosis:  Dysphagia, unspecified     Date of Study:  05/25/17    History of Present Illness/Injury: Patient is a 77 y.o. female who reports difficulty swallowing.  She indicates food goes down but then she starts to cough up clear liquids.  She particularly notes coughing immediately after lying down.  Cold fluids also cause throat to "close up".  James reports a hx of back surgery about 3 years ago, which is about the time her cough and swallowing issues started.  She also has a hx of hiatal hernia and GERD.    Past Medical History:   Diagnosis Date   ??? Breast cancer ([place]) 1999    in remission   ??? DDD (degenerative disc disease)    ??? Large bowel obstruction ([place]) 02/2015    [facility]   ??? LBP (low back pain)    ??? SS (spinal stenosis)        Reported Dysphagia Symptoms:  [x] Coughing [] Food catching in throat [] Drooling  [] Reflux               [] Wet Vocal Quality  [] Other:     Reason for referral:   [x] Assess physiology of swallow   [x] Determine appropriate diet, posture, or compensatory strategies  [x] Rule out aspiration    [] Other:      Prior MBSS:  [x] No   [] Yes    Date:      Results:      Pain  [x] No     [] Yes      Location: N/A  Pain Rating (0-10 pain scale): 0  Pain Description: N/A      Current Diet:   [] NPO [x]  Regular diet  []  Soft diet []  Pureed diet         []  Dysphagia Level 1 []  Dysphagia Level  2 []  Dysphagia Level 3    Current Liquids: [x]  Thin  [] Nectar []  Honey [] Pudding    Respiratory Status: [x]  Independent []  Nasal Cannula []  Oxygen Mask        []  Tracheostomy []  Other:       []  Ventilator/Settings:    Behavioral Observation: [x]  Alert   [x]  Oriented   []  Confused  []  Lethargic      []  Dysarthric  []  Limited Direction  Following  []  Agitated  []  Other:    Patient was evaluated using:   [x]  Thin liquid   []  Nectar thick liquid  []  Honey thick liquid  []  Pudding thick liquid    [x]  Solid [x]  Soft  [x]  Puree    Oral Peripheral/Pharyngeal Mechanism Exam     Lingual Function   ROM [x] WFL              [] Limited:    [] elevation     [] depression     [] protrusion       [] retraction     [] lateralization     Strength [x] WFL  [] Weak:   [] left  [] right      Sensation: [x] WFL  [] Other:      Symmetry [x] WFL  [] Deviation:     [] left             [] right      Coordination [x] WFL  [] Other:  Labial Function   ROM [x] WFL    [] Limited:    [] protrusion      [] retraction     [] rounding     Strength [x] WFL  [] Weak:   [] left  [] right      Sensation: [x] WFL  [] Other:      Symmetry [x] WFL  [] Deviation:     [] left             [] right      Coordination [x] WFL  [] Other:       Drooling:  [x] No  [] Yes:   [] left [] right    Facial Appearance   Facial Weakness  [x] No [] Yes:   [] left [] right    Dentition   [x] natural, condition:    [x] good  [] fair  [] poor   [] edentulous   [] partials:   [] upper [] lower [] loose [] not worn for MBSS   [] dentures:   [] upper [] lower [] loose [] not worn for MBSS      ORAL PREPARATION PHASE: [x]  WFL  []  Impaired/Reduced   []  Slow mastication     []  Uncoordinated mastication    []  Decreased bolus control   []  Decreased bolus formation   []  Loss of bolus from lips / Anterior spillage   []  OTHER:       ORAL PHASE: [x]  WFL              []  Impaired/Reduced    []  Residue in the anterior sulcus                   []  Residue in the lateral sulcus - right   []  Residue in the lateral sulcus - left               []  Uncoordinated AP movement   []  Slow AP movement                             []  Decreased lingual elevation   []  Decreased tongue base retraction               []  Uncontrolled bolus/diffuse fall over tongue base   []  Piecemeal deglutition     []  Base of tongue residue   []  OTHER:     ORAL PHASE DOSS SCORE: (Dysphagia outcome and severity  scale)  [x]  7 = Normal in all situations  []  6 = WFL / Mod I; Normal diet; May have mild oral delay  []  5 = Mild dysphagia; May have one diet consistency restricted; Mild oral residue -clears.  []  4 = Mild-Moderate dysphagia; May have one or two diet consistencies restricted; Oral residue clears with cue;  Intermittent supervision or cueing.  []  3 = Moderate dysphagia; Total assist with strategies; Two or more consistencies restricted; Moderate oral residue clears with cue;   []  2 = Moderately Severe dysphagia; Tolerates at least one consistency safely with total use of strategies; Severe oral residue and unable to clear or needs multiple cues;  Non-oral nutrition.  []  1 = Severe dysphagia; NPO; Unable to tolerate any po safely; Severe oral loss of bolus or oral residue; Non-oral nutrition.         PHARYNGEAL PHASE: [x]  WFL  []  Impaired   []  Absent Swallow                []  Delayed Swallow               []  Decreased airway protection   []  Decreased epiglottic inversion     []   Decreased hyolaryngeal elevation   []  Residue in the valleculae-trace amount               []  Residue in the pyriform sinus    []  Cricopharyngeal dysfunction              []  Residue along posterior pharyngeal wall     []  Residue along the ariepiglottic folds    []  Decreased pharyngeal contraction   []  OTHER:    PHARYNGEAL PHASE DOSS SCORE: (Dysphagia outcome and severity scale)  [x]  7 = Normal in all situations; Normal diet; No strategies  []  6 = WFL / Mod I; May have mild pharyngeal delay or residue but clears spontaneously; No aspiration or laryngeal penetration  []  5 = Mild dysphagia:  May need one consistency restricted; May have one or more of the following:  Aspiration with thin - cough to clear;  Airway penetration midway to the vocal cords with one or more consistency or to the vocal folds with one consistency, but clears spontaneously; Residue in the pharynx clears spontaneously.  []  4 = Mild - Moderate dysphagia: One or two  consistencies restricted; May exhibit one or more of the following:  Residue clears with cue; Aspiration of one consistency with weak or no reflexive cough; Laryngeal penetration to the vocal cords with cough with two consistencies; Laryngeal penetration to the vocal cords without cough on one consistency.  []  3 = Moderate dysphagia:  Two or more diet consistencies restricted; May exhibit one or more of the following:  Moderate residue clears with cue; Airway penetration to the level of the vocal folds without cough with two or more consistencies; Aspiration with two consistencies with weak or no reflexive cough; Aspiration of one consistency, no cough and airway penetration with one consistency, no cough.  []  2 = Moderately Severe dysphagia:  Tolerates at least one consistency safely with total use of strategies; Non-oral nutrition; Severe residue unable to clear; Aspiration with two or more consistencies with no cough; Aspiration with one or more consistency, no cough and airway penetration to the vocal folds with one or more consistency, no cough.  []  1 = Severe dysphagia:  NPO; Unable to tolerate any po safely; Severe residue unable to clear; Silent aspiration with two or more consistencies, non-functional cough;  OR Unable to achieve a swallow.        SIGNS AND SYMPTOMS OF LARYNGEAL PENETRATION / ASPIRATION:  [x]  No evidence of laryngeal penetration  [x]  No evidence of aspiration  []  Laryngeal penetration evident with:  []  Audible aspiration evident with:  []  Silent aspiration evident with:    PENETRATION-ASPIRATION SCALE (PAS):  [x]  1 = Material does not enter the airway  []  2 = Material enters the airway, remains above the vocal folds, and is ejected from the airway  []  3 = Material enters the airway, remains above the vocal folds, and is not ejected from airway  []  4 = Material enters the airway, contacts the vocal folds, and is ejected from the airway  []  5 = Material enters the airway, contacts the vocal  folds, and is not ejected from the airway  []  6 = Material enters the airway, passes below the vocal folds, and is ejected into the larynx or out of the airway  []  7 = Material enters the airway, passes below the vocal folds, and is not ejected from the trachea despite effort  []  8 = Material enters the airway, passes below the vocal folds, and no effort  is made to eject    ESOPHAGEAL PHASE:   [x]  No significant findings  []  See Radiology Report for details  []  Recommend further esophageal testing    ATTEMPTED TECHNIQUES:                                 Comments:  [] Small bolus size []  Effective [] Not Effective    [] Straw []  Effective [] Not Effective    [] Cup []  Effective [] Not Effective    [] Chin tuck []  Effective [] Not Effective    [] Head Turn []  Effective [] Not Effective    [] Spoon presentations []  Effective [] Not Effective    []  Volitional cough []  Effective [] Not Effective    [] Spontaneous cough []  Effective [] Not Effective    [] OTHER: []  Effective [] Not Effective      DIAGNOSTIC IMPRESSIONS:  Patient presents with oral preparatory phase, oral phase, and pharyngeal phase of swallow WFL.  No penetration, no aspiration, no stasis.  Patient is at minimal risk of aspiration and pulmonary compromise.    Question whether coughing is related to possible reflux or sinuses as it seems to occur more when patient lies down.      DIET RECOMMENDATIONS:      []  NPO  [x]  Regular []  Soft  []  Soft with ground meat []  Puree  []  Dysphagia Level 1 []  Dysphagia Level 2 []  Dysphagia Level 3    LIQUID RECOMMENDATIONS:   [x]  Thin liquid []  Nectar thick liquid []  Honey thick liquid []  Pudding thick liquid    STRATEGIES:   [x]  Full upright position  [x]  Small bite/sip   []  No Straw   []  Multiple Swallow  []  Chin tuck   []  Head turn   []  Pulmonary monitoring []  Oral care after all meals  []  Supervision   []  Medication in applesauce   []  Direct 1:1 Supervision []  Spoon all liquids   [x]  Alternate solid / liquid []  Limit distractions   []   Monitor for fatigue  []  PMV in place for all po   []  OTHER:    PATIENT STRENGTHS:  []  Motivated [x]  Cooperative   []  Good family support    []  Prior level of function  []  OTHER:    REHAB POTENTIAL:   []  Excellent [x]  Good []  Fair  []  Poor    EDUCATION:   Learner: [x]  Patient            []  Significant other                                   []  Son/Daughter []  Parent []  Other:     Education: [x]  Reviewed results and recommendations of this evaluation     []  Reviewed diet and strategies     []  Reviewed signs, symptoms and risk of aspiration     []  Demonstrated how to thick liquid appropriately.     []  Reviewed goals and Plan of Care     []  OTHER:     Method: [x]  Discussion  []  Demonstration []  Hand-out     []  Other:     Evaluation of Education:     [x]  Verbalizes understanding      []  Demonstrates with assistance     []  Demonstrates without assistance                                  []   Needs further instruction      []  No evidence of learning                                        []  Family not present     PATIENT GOALS: []  Pt did not state; will further assess during treatment.     []  Return to the least restricted diet possible     [x]  Return to previous level of function     []  Other:    PLAN / TREATMENT RECOMMENDATIONS:  [x]  No further speech therapy services indicated.  []  Speech Therapy evaluation to assess speech, language, cognition and/or voice  []  Skilled dysphagia treatment ___ times per week for ___ weeks.  []  OTHER:      Electronically signed by:     Elsie Amis, MS, CCC-SLP          Date: 05/25/2017

## 2017-05-26 ENCOUNTER — Ambulatory Visit: Admit: 2017-05-26 | Discharge: 2017-05-26 | Payer: MEDICARE | Attending: Otolaryngology | Primary: Family Medicine

## 2017-05-26 DIAGNOSIS — R1310 Dysphagia, unspecified: Secondary | ICD-10-CM

## 2017-05-26 MED ORDER — OMEPRAZOLE 20 MG PO CPDR
20 MG | ORAL_CAPSULE | Freq: Every day | ORAL | 3 refills | Status: DC
Start: 2017-05-26 — End: 2017-09-13

## 2017-05-26 NOTE — Progress Notes (Signed)
Katie Juarez  05/26/17    Chief Complaint   Patient presents with   ??? Dysphagia     chokes on food    ??? Cough       HPI: Co dysphagia since back surg 3 yrs ago , MBS nl, has acid reflux on omep 40 at HS    Past Med Hx:     Past Medical History:   Diagnosis Date   ??? Breast cancer ([place]) 1999    in remission   ??? DDD (degenerative disc disease)    ??? Large bowel obstruction ([place]) 02/2015    [facility]   ??? LBP (low back pain)    ??? SS (spinal stenosis)         Past Surgical History:   Procedure Laterality Date   ??? APPENDECTOMY  1956   ??? BLADDER SUSPENSION  1990   ??? BREAST LUMPECTOMY Right 1999   ??? CHOLECYSTECTOMY  1990   ??? HYSTERECTOMY  1980    Done for endometriosis; done in [place]   ??? OTHER SURGICAL HISTORY  11/14/13, 06/23/11, 06/30/11, 07/12/11 12/29/11    L4/L5 IESI   ??? OTHER SURGICAL HISTORY  12/17/13    L5/S1 IESI   ??? OTHER SURGICAL HISTORY Bilateral 05/30/14    SIJ and piriformis   ??? OTHER SURGICAL HISTORY Bilateral 09/16/2014    L3, L4, L5 Diagnostic Medial Branch Block   ??? OTHER SURGICAL HISTORY Bilateral 09/26/2014    L5 TFE   ??? OTHER SURGICAL HISTORY  10/10/2014    bil L5 TFE   ??? OTHER SURGICAL HISTORY  10/31/2014    caudal   ??? TONSILLECTOMY  1946       Family History:     Family History   Problem Relation Age of Onset   ??? Other Mother         ALS - diagnosed at age 32   ??? Stroke Father         age 26   ??? Cancer Sister         pt thinks it was liver   ??? Cancer Brother         leukemia   ??? Diabetes Maternal Grandfather    ??? Other Brother         drowned   ??? Other Brother         died at age 89; think due to spinal meningitis   ??? Lupus Sister    ??? Alzheimer's Disease Sister    ??? Dementia Sister        Social Hx:     Social History     Socioeconomic History   ??? Marital status: Married     Spouse name: Not on file   ??? Number of children: Not on file   ??? Years of education: Not on file   ??? Highest education level: Not on file   Occupational History   ??? Not on file   Social Needs   ??? Financial resource strain: Not on  file   ??? Food insecurity:     Worry: Not on file     Inability: Not on file   ??? Transportation needs:     Medical: Not on file     Non-medical: Not on file   Tobacco Use   ??? Smoking status: Former Smoker     Packs/day: 1.00     Years: 40.00     Pack years: 40.00     Start date: 01/10/1953  Last attempt to quit: 01/11/1988     Years since quitting: 29.3   ??? Smokeless tobacco: Never Used   ??? Tobacco comment: quit 25 years ago 1990   Substance and Sexual Activity   ??? Alcohol use: No     Alcohol/week: 0.0 oz   ??? Drug use: Never   ??? Sexual activity: Not on file   Lifestyle   ??? Physical activity:     Days per week: Not on file     Minutes per session: Not on file   ??? Stress: Not on file   Relationships   ??? Social connections:     Talks on phone: Not on file     Gets together: Not on file     Attends religious service: Not on file     Active member of club or organization: Not on file     Attends meetings of clubs or organizations: Not on file     Relationship status: Not on file   ??? Intimate partner violence:     Fear of current or ex partner: Not on file     Emotionally abused: Not on file     Physically abused: Not on file     Forced sexual activity: Not on file   Other Topics Concern   ??? Not on file   Social History Narrative   ??? Not on file       Current Medications:     Current Outpatient Medications:   ???  omeprazole (PRILOSEC) 20 MG delayed release capsule, Take 2 capsules by mouth daily, Disp: 60 capsule, Rfl: 3  ???  cetirizine (ZYRTEC ALLERGY) 10 MG tablet, Take 1 tablet by mouth daily, Disp: 30 tablet, Rfl: 0  ???  fluticasone (FLONASE) 50 MCG/ACT nasal spray, 1 spray by Nasal route daily, Disp: 1 Bottle, Rfl: 0  ???  budesonide-formoterol (SYMBICORT) 160-4.5 MCG/ACT AERO, Inhale 1 puff into the lungs 2 times daily, Disp: 3 Inhaler, Rfl: 3  ???  Multiple Vitamins-Minerals (THRIVE FOR LIFE WOMENS) TABS, Take by mouth, Disp: , Rfl:   ???  albuterol sulfate HFA (VENTOLIN HFA) 108 (90 Base) MCG/ACT inhaler, Ventolin HFA 90  mcg/actuation aerosol inhaler  Inhale 2 puffs every 6 hours by inhalation route as needed.  ASTHMA, Disp: , Rfl:   ???  docusate sodium (COLACE) 100 MG capsule, Take 100 mg by mouth 2 times daily, Disp: , Rfl:   ???  atorvastatin (LIPITOR) 20 MG tablet, Take 1 tablet by mouth daily, Disp: 90 tablet, Rfl: 3  ???  citalopram (CELEXA) 10 MG tablet, Take 1 tablet by mouth nightly, Disp: 90 tablet, Rfl: 3  ???  omeprazole (PRILOSEC) 40 MG delayed release capsule, Take 1 capsule by mouth daily, Disp: 90 capsule, Rfl: 3  ???  LORazepam (ATIVAN) 0.5 MG tablet, Take 1 tablet by mouth nightly as needed for Anxiety for up to 90 days., Disp: 90 tablet, Rfl: 0  ???  aspirin 81 MG tablet, Take 81 mg by mouth daily., Disp: , Rfl:   ???  polyvinyl alcohol-povidone (HYPOTEARS) 1.4-0.6 % ophthalmic solution, Place 1-2 drops into both eyes as needed., Disp: , Rfl:      ROS:   CV: neg   Endocrine:    Resp:     GI:  Hiatal hernia     Neuro:   PE:     General appearance:  Normal                 Ability to Communicate:  Normal       Head & Face:  Normal   Salivary Glands:  Normal              Facial Strength:  Normal   Ears:    Pinna:  Normal            EAC:  Normal      TMs:  Normal       Hearing:    [address]:  [place] Rub     Nose:    External: Normal    Septum:  Normal    Turbinates: Normal             Nasal Cavity: Normal         Naso Pharyngoscopy:     Nasal Endoscopy:      Oral Cavity & Oral Pharynx:    Tongue:  Normal    Teeth & Gums:             Palate:  Norma     Tonsils:      FOM:  Normal     Other:      Neck:  Procedure: FIBEROPTIC DIRECT LARYNGOSCOPY    Pre-op dx: dysphagia     Post - op dx: Same    Indications: same    Anesthesia: Topical spray of oxymetazoline & 4% lidocaine mixed 1:1    Description of procedure: Each nostril was sprayed with an oxymetazoline & 4% lidocaine mixture. After approximately 5 minutes a  fiberoptic nasopharyngoscope was passed through the  nostril, through the naso pharynx to the  hypopharynx. The base of tongue, epiglottis, aryepglotic folds, arytenoids, intra-arytenoid space, false and true vocal cords, and pyriform sinuses were visualized. Vocal cord mobility was also assessed.     Findings:  Base of tongue: Normal    Epiglottis: Normal    Aryepiglottic folds: Normal    Arytenoids: Normal    False vocal cords: Normal    True vocal cords: Normal    Pyriform sinuses: Normal    Vocal cord mobility: Normal    Intra-aytenoid space: Normal    Hypopharynx : Normal    Thyroid:Normal       Lymph nodes: Normal           Trachea:  Normal      Masses:  Normal    Other:   Has had FESS     Eyes:    EOMs:      Nystagmus:      Neurological:    CN V:      CN VII:       Gait & Station:      [place]:      Tandem Gait:      [place]:       Oriented x 3: Normal     Affect:  Normal    Data reviewed:      ASSESSMENT:   Diagnosis Orders   1. Dysphagia, unspecified type  PR LARYNGOSCOPY FLEXIBLE DIAGNOSTIC   2. Gastroesophageal reflux disease without esophagitis  PR LARYNGOSCOPY FLEXIBLE DIAGNOSTIC       PLAN:take omep in AM and before dinner, consider zantac at HS, see 4 wks  Return in about 1 month (around 06/23/2017).    Orders Placed This Encounter   Medications   ??? omeprazole (PRILOSEC) 20 MG delayed release capsule     Sig: Take 2 capsules by mouth daily     Dispense:  60 capsule  Refill:  3         Gildardo Griffes Donta Mcinroy MD

## 2017-06-23 NOTE — Progress Notes (Signed)
Katie Juarez  06/23/17    Chief Complaint   Patient presents with   ??? Dysphagia       HPI:  fu for dysphagia since back surg 3 yrs ago, MBS nl, last visit increased omep to BID, but gave her upset stomach, now has some regurge    Past Med Hx:     Past Medical History:   Diagnosis Date   ??? Breast cancer ([place]) 1999    in remission   ??? DDD (degenerative disc disease)    ??? Large bowel obstruction ([place]) 02/2015    [facility]   ??? LBP (low back pain)    ??? SS (spinal stenosis)         Past Surgical History:   Procedure Laterality Date   ??? APPENDECTOMY  1956   ??? BLADDER SUSPENSION  1990   ??? BREAST LUMPECTOMY Right 1999   ??? CHOLECYSTECTOMY  1990   ??? HYSTERECTOMY  1980    Done for endometriosis; done in [place]   ??? OTHER SURGICAL HISTORY  11/14/13, 06/23/11, 06/30/11, 07/12/11 12/29/11    L4/L5 IESI   ??? OTHER SURGICAL HISTORY  12/17/13    L5/S1 IESI   ??? OTHER SURGICAL HISTORY Bilateral 05/30/14    SIJ and piriformis   ??? OTHER SURGICAL HISTORY Bilateral 09/16/2014    L3, L4, L5 Diagnostic Medial Branch Block   ??? OTHER SURGICAL HISTORY Bilateral 09/26/2014    L5 TFE   ??? OTHER SURGICAL HISTORY  10/10/2014    bil L5 TFE   ??? OTHER SURGICAL HISTORY  10/31/2014    caudal   ??? TONSILLECTOMY  1946       Family History:     Family History   Problem Relation Age of Onset   ??? Other Mother         ALS - diagnosed at age 51   ??? Stroke Father         age 58   ??? Cancer Sister         pt thinks it was liver   ??? Cancer Brother         leukemia   ??? Diabetes Maternal Grandfather    ??? Other Brother         drowned   ??? Other Brother         died at age 12; think due to spinal meningitis   ??? Lupus Sister    ??? Alzheimer's Disease Sister    ??? Dementia Sister        Social Hx:     Social History     Socioeconomic History   ??? Marital status: Married     Spouse name: Not on file   ??? Number of children: Not on file   ??? Years of education: Not on file   ??? Highest education level: Not on file   Occupational History   ??? Not on file   Social Needs   ??? Financial  resource strain: Not on file   ??? Food insecurity:     Worry: Not on file     Inability: Not on file   ??? Transportation needs:     Medical: Not on file     Non-medical: Not on file   Tobacco Use   ??? Smoking status: Former Smoker     Packs/day: 1.00     Years: 40.00     Pack years: 40.00     Start date: 01/10/1953     Last  attempt to quit: 01/11/1988     Years since quitting: 29.4   ??? Smokeless tobacco: Never Used   ??? Tobacco comment: quit 25 years ago 1990   Substance and Sexual Activity   ??? Alcohol use: No     Alcohol/week: 0.0 oz   ??? Drug use: Never   ??? Sexual activity: Not on file   Lifestyle   ??? Physical activity:     Days per week: Not on file     Minutes per session: Not on file   ??? Stress: Not on file   Relationships   ??? Social connections:     Talks on phone: Not on file     Gets together: Not on file     Attends religious service: Not on file     Active member of club or organization: Not on file     Attends meetings of clubs or organizations: Not on file     Relationship status: Not on file   ??? Intimate partner violence:     Fear of current or ex partner: Not on file     Emotionally abused: Not on file     Physically abused: Not on file     Forced sexual activity: Not on file   Other Topics Concern   ??? Not on file   Social History Narrative   ??? Not on file       Current Medications:     Current Outpatient Medications:   ???  cetirizine (ZYRTEC ALLERGY) 10 MG tablet, Take 1 tablet by mouth daily, Disp: 30 tablet, Rfl: 0  ???  budesonide-formoterol (SYMBICORT) 160-4.5 MCG/ACT AERO, Inhale 1 puff into the lungs 2 times daily, Disp: 3 Inhaler, Rfl: 3  ???  Multiple Vitamins-Minerals (THRIVE FOR LIFE WOMENS) TABS, Take by mouth, Disp: , Rfl:   ???  albuterol sulfate HFA (VENTOLIN HFA) 108 (90 Base) MCG/ACT inhaler, Ventolin HFA 90 mcg/actuation aerosol inhaler  Inhale 2 puffs every 6 hours by inhalation route as needed.  ASTHMA, Disp: , Rfl:   ???  docusate sodium (COLACE) 100 MG capsule, Take 100 mg by mouth 2 times daily,  Disp: , Rfl:   ???  atorvastatin (LIPITOR) 20 MG tablet, Take 1 tablet by mouth daily, Disp: 90 tablet, Rfl: 3  ???  citalopram (CELEXA) 10 MG tablet, Take 1 tablet by mouth nightly, Disp: 90 tablet, Rfl: 3  ???  LORazepam (ATIVAN) 0.5 MG tablet, Take 1 tablet by mouth nightly as needed for Anxiety for up to 90 days., Disp: 90 tablet, Rfl: 0  ???  aspirin 81 MG tablet, Take 81 mg by mouth daily., Disp: , Rfl:   ???  polyvinyl alcohol-povidone (HYPOTEARS) 1.4-0.6 % ophthalmic solution, Place 1-2 drops into both eyes as needed., Disp: , Rfl:   ???  omeprazole (PRILOSEC) 20 MG delayed release capsule, Take 2 capsules by mouth daily, Disp: 60 capsule, Rfl: 3  ???  fluticasone (FLONASE) 50 MCG/ACT nasal spray, 1 spray by Nasal route daily, Disp: 1 Bottle, Rfl: 0  ???  omeprazole (PRILOSEC) 40 MG delayed release capsule, Take 1 capsule by mouth daily, Disp: 90 capsule, Rfl: 3     ROS:   CV: neg   Endocrine:    Resp:     GI:       Neuro:   PE:     General appearance:  Normal                 Ability to Communicate:  Normal  Head & Face:  Normal   Salivary Glands:  Normal              Facial Strength:  Normal   Ears:    Pinna:  Normal            EAC:  Normal      TMs:  Normal       Hearing:    [address]:  [employer] Rub     Nose:    External: Normal    Septum:  Normal    Turbinates: Normal             Nasal Cavity: Normal         Naso Pharyngoscopy:     Nasal Endoscopy:      Oral Cavity & Oral Pharynx:    Tongue:  Normal    Teeth & Gums:             Palate:  Norma     Tonsils:      FOM:  Normal     Other:      Neck:    Thyroid:Normal       Lymph nodes: Normal           Trachea:  Normal      Masses:  Normal    Other:        Eyes:    EOMs:      Nystagmus:      Neurological:    CN V:      CN VII:       Gait & Station:      [place]:      Tandem Gait:      [place]:       Oriented x 3: Normal     Affect:  Normal    Data reviewed:      ASSESSMENT:   Diagnosis Orders   1. Gastroesophageal reflux disease without esophagitis      2. Dysphagia, unspecified type         PLAN: take omep 1x a day prior to supper, if no improve call in to get referral to see Gen surg for possible EGD  Return if symptoms worsen or fail to improve.    No orders of the defined types were placed in this encounter.        Gildardo Griffes Silvestre Mines MD

## 2017-07-04 ENCOUNTER — Encounter

## 2017-07-04 NOTE — Telephone Encounter (Signed)
Citalopram was already refilled on 04-24-17 x 1 year by Dr Renard Hamper.  [facility] confirmed they have it on file along with the Atorvastatin & Omeprazole that [facility] did at the same time.  Left message on [facility]'s ans machine to check with the pharmacy.

## 2017-08-29 ENCOUNTER — Encounter

## 2017-08-29 NOTE — Telephone Encounter (Signed)
Last appt 04-24-17.  Next appt 09-15-17.  Lorazepam last filled April 15 for #90 per [place].

## 2017-08-31 MED ORDER — LORAZEPAM 0.5 MG PO TABS
0.5 MG | ORAL_TABLET | ORAL | 0 refills | Status: DC
Start: 2017-08-31 — End: 2017-11-20

## 2017-08-31 NOTE — Telephone Encounter (Signed)
Controlled Substance Monitoring:    Acute and Chronic Pain Monitoring:   RX Monitoring 08/31/2017   Periodic Controlled Substance Monitoring No signs of potential drug abuse or diversion identified.

## 2017-09-13 ENCOUNTER — Ambulatory Visit: Admit: 2017-09-13 | Discharge: 2017-09-13 | Payer: MEDICARE | Attending: Family Medicine | Primary: Family Medicine

## 2017-09-13 DIAGNOSIS — F419 Anxiety disorder, unspecified: Secondary | ICD-10-CM

## 2017-09-13 MED ORDER — CITALOPRAM HYDROBROMIDE 20 MG PO TABS
20 MG | ORAL_TABLET | Freq: Every day | ORAL | 1 refills | Status: DC
Start: 2017-09-13 — End: 2017-12-13

## 2017-09-13 MED ORDER — ZOSTER VAC RECOMB ADJUVANTED 50 MCG/0.5ML IM SUSR
50 MCG/0.5ML | Freq: Once | INTRAMUSCULAR | 1 refills | Status: AC
Start: 2017-09-13 — End: 2017-09-13

## 2017-09-13 MED ORDER — TETANUS-DIPHTH-ACELL PERTUSSIS 5-2-15.5 LF-MCG/0.5 IM SUSP
Freq: Once | INTRAMUSCULAR | 0 refills | Status: AC
Start: 2017-09-13 — End: 2017-09-13

## 2017-09-13 MED ORDER — CETIRIZINE HCL 10 MG PO TABS
10 MG | ORAL_TABLET | Freq: Every day | ORAL | 3 refills | Status: DC
Start: 2017-09-13 — End: 2018-04-18

## 2017-09-13 NOTE — Progress Notes (Signed)
[name]  [address], [place] [address]  [phone]      Katie Juarez is a 77 y.o. female who presents today for her medical conditions/complaints as noted below.  Katie Juarez is c/o of Medicare AWV; Hyperlipidemia (5 month follow up); and Other (constipation and diarrhea)      HPI:     Pt here today for follow-up of COPD and HLD: also for MAW visit (see other note).    Using Symbicort BID for COPD - stable on this.  Will use Albuterol inhaler as needed, but does not use this often (maybe a couple times per month, mostly at night).  ??  Taking Celexa 10 mg nightly for depression/anxiety; also uses Lorazepam 0.5 mg nightly as well.  However, she is wondering about increasing her Lorazepam dose, as she is having increased stress/anxiety due to her husband currently.  States he is in such constant pain that he is more irritable and angry; states she feels "always on edge".  Does not use any Lorazepam during the day.  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  Not currently seeing a Psychiatrist.  ??  Taking Lipitor 20 mg daily for HLD - stable; due to re-check her labs now.  Also taking ASA 81 mg daily.  ??  Only taking Omeprazole 40 mg occasionally for GERD now - no increase in symptoms with decreasing from once daily.      Still has cough with foamy??white sputum production every few days, worse at night.  Still has her bed elevated.  Had been prescribed Zyrtec 10 mg daily in 05/2017 in [facility] - does think that this helped; has now been out since 06/2017, so would like this refilled.  Still using Flonase once daily at night for allergic rhinitis - stable.  ??  Taking Colace 100 mg once nightly and/or Miralax as needed for constipation.  Has had 2 episodes of L-sided abdominal pain, followed by diarrhea with urgency.  Otherwise, her stools vary from soft to hard.  Knows she does not eat enough fruit; tries to eat Raisin Bran for  fiber.  ??  Taking Thrive multi-vitamin capsule, drinks shake, and wears a patch intermittently - has not been as consistent with this recently due to running out and cost.     Pt gets recurrent UTI's, but has not had one recently (x past 6-8 months).        Past Medical History:   Diagnosis Date   ??? Breast cancer ([place]) 1999    in remission   ??? DDD (degenerative disc disease)    ??? Large bowel obstruction ([place]) 02/2015    [facility]   ??? LBP (low back pain)    ??? SS (spinal stenosis)       Past Surgical History:   Procedure Laterality Date   ??? APPENDECTOMY  1956   ??? BLADDER SUSPENSION  1990   ??? BREAST LUMPECTOMY Right 1999   ??? CHOLECYSTECTOMY  1990   ??? HYSTERECTOMY  1980    Done for endometriosis; done in [place]   ??? OTHER SURGICAL HISTORY  11/14/13, 06/23/11, 06/30/11, 07/12/11 12/29/11    L4/L5 IESI   ??? OTHER SURGICAL HISTORY  12/17/13    L5/S1 IESI   ??? OTHER SURGICAL HISTORY Bilateral 05/30/14    SIJ and piriformis   ??? OTHER SURGICAL HISTORY Bilateral 09/16/2014    L3, L4, L5  Diagnostic Medial Branch Block   ??? OTHER SURGICAL HISTORY Bilateral 09/26/2014    L5 TFE   ??? OTHER SURGICAL HISTORY  10/10/2014    bil L5 TFE   ??? OTHER SURGICAL HISTORY  10/31/2014    caudal   ??? TONSILLECTOMY  1946     Family History   Problem Relation Age of Onset   ??? Other Mother         ALS - diagnosed at age 44   ??? Stroke Father         age 1   ??? Cancer Sister         pt thinks it was liver   ??? Cancer Brother         leukemia   ??? Diabetes Maternal Grandfather    ??? Other Brother         drowned   ??? Other Brother         died at age 78; think due to spinal meningitis   ??? Lupus Sister    ??? Alzheimer's Disease Sister    ??? Dementia Sister      Social History     Tobacco Use   ??? Smoking status: Former Smoker     Packs/day: 1.00     Years: 40.00     Pack years: 40.00     Start date: 01/10/1953     Last attempt to quit: 01/11/1988     Years since quitting: 29.7   ??? Smokeless tobacco: Never Used   ??? Tobacco comment: quit 25 years ago 1990   Substance Use  Topics   ??? Alcohol use: No     Alcohol/week: 0.0 standard drinks     Current Outpatient Medications   Medication Sig Dispense Refill   ??? cetirizine (ZYRTEC ALLERGY) 10 MG tablet Take 1 tablet by mouth daily 90 tablet 3   ??? citalopram (CELEXA) 20 MG tablet Take 1 tablet by mouth daily 30 tablet 1   ??? LORazepam (ATIVAN) 0.5 MG tablet TAKE 1 TABLET BY MOUTH NIGHTLY AS NEEDED FOR ANXIETY 90 tablet 0   ??? budesonide-formoterol (SYMBICORT) 160-4.5 MCG/ACT AERO Inhale 1 puff into the lungs 2 times daily 3 Inhaler 3   ??? Multiple Vitamins-Minerals (THRIVE FOR LIFE WOMENS) TABS Take by mouth     ??? albuterol sulfate HFA (VENTOLIN HFA) 108 (90 Base) MCG/ACT inhaler Ventolin HFA 90 mcg/actuation aerosol inhaler   Inhale 2 puffs every 6 hours by inhalation route as needed.   ASTHMA     ??? docusate sodium (COLACE) 100 MG capsule Take 100 mg by mouth 2 times daily     ??? atorvastatin (LIPITOR) 20 MG tablet Take 1 tablet by mouth daily 90 tablet 3   ??? aspirin 81 MG tablet Take 81 mg by mouth daily.     ??? polyvinyl alcohol-povidone (HYPOTEARS) 1.4-0.6 % ophthalmic solution Place 1-2 drops into both eyes as needed.     ??? fluticasone (FLONASE) 50 MCG/ACT nasal spray 1 spray by Nasal route daily 1 Bottle 0   ??? omeprazole (PRILOSEC) 40 MG delayed release capsule Take 1 capsule by mouth daily 90 capsule 3     No current facility-administered medications for this visit.      Allergies   Allergen Reactions   ??? Morphine Other (See Comments)     Night sweats,felt "loopy".   ??? Pcn [Penicillins] Hives   ??? Zithromax [Azithromycin] Hives       Health Maintenance   Topic Date Due   ???  DTaP/Tdap/Td vaccine (1 - Tdap) 06/09/1958   ??? Shingles Vaccine (1 of 2) 06/08/1989   ??? DEXA (modify frequency per FRAX score)  06/08/2004   ??? Annual Wellness Visit (AWV)  06/07/2017   ??? Flu vaccine (1) 09/10/2017   ??? Pneumococcal 65+ years Vaccine  Completed       Subjective:      Review of Systems   Constitutional: Negative for fever and unexpected weight change.    HENT: Negative for hearing loss.    Eyes: Negative for visual disturbance.   Respiratory: Negative for shortness of breath.    Cardiovascular: Negative for chest pain.   Gastrointestinal: Positive for constipation (intermittent). Negative for blood in stool and vomiting.   Genitourinary: Negative for dysuria and hematuria.   Musculoskeletal: Positive for arthralgias (L knee x past 1.5 months - has been "clicking"; pain is along medial aspect of knee.  Hard to get up/down off the floor with cleaning or playing with the dog.). Negative for joint swelling.   Skin: Negative for color change.   Neurological: Negative for dizziness and light-headedness.   Psychiatric/Behavioral: Negative for dysphoric mood. The patient is nervous/anxious.        Objective:     Vitals:    09/13/17 0819   BP: 122/82   Pulse: 68   SpO2: 96%   Weight: 147 lb (66.7 kg)   Height: 5\' 4"  (1.626 m)     Physical Exam   Constitutional: She is oriented to person, place, and time. She appears well-developed and well-nourished. No distress.   HENT:   Head: Normocephalic and atraumatic.   Eyes: Conjunctivae are normal.   Neck: Neck supple.   Cardiovascular: Normal rate, regular rhythm and normal heart sounds.   Pulmonary/Chest: Effort normal and breath sounds normal. No respiratory distress.   Abdominal: Soft. Bowel sounds are normal. She exhibits no distension. There is no tenderness.   Musculoskeletal: She exhibits no edema.        Left knee: She exhibits normal range of motion, no swelling and no deformity. Tenderness found. Medial joint line (mild) tenderness noted.   Neurological: She is alert and oriented to person, place, and time.   Skin: Skin is warm and dry.   Psychiatric: She has a normal mood and affect.       Assessment:       Diagnosis Orders   1. Anxiety  citalopram (CELEXA) 20 MG tablet   2. Hyperlipidemia, unspecified hyperlipidemia type     3. 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 Environmental and seasonal allergies  cetirizine (ZYRTEC ALLERGY) 10 MG tablet   6. Gastroesophageal reflux disease, esophagitis presence not specified     7. Acute pain of left knee     8. Post-menopausal  DEXA Bone Density Axial Skeleton   9. Need for prophylactic vaccination against diphtheria-tetanus-pertussis (DTP)  Tdap (ADACEL) 05-11-13.5 LF-MCG/0.5 injection   10. Need for prophylactic vaccination and inoculation against varicella  zoster recombinant adjuvanted vaccine [facility]) 50 MCG/0.5ML SUSR injection       Plan:      Return in 3 months (on 12/13/2017) for Follow-up HLD, anxiety.    Orders Placed This Encounter   Procedures   ??? DEXA Bone Density Axial Skeleton     Standing Status:   Future     Standing Expiration Date:   09/14/2018   ??? Comprehensive Metabolic Panel   ??? Lipid  Panel     Orders Placed This Encounter   Medications   ??? Tdap (ADACEL) 05-11-13.5 LF-MCG/0.5 injection     Sig: Inject 0.5 mLs into the muscle once for 1 dose     Dispense:  0.5 mL     Refill:  0   ??? zoster recombinant adjuvanted vaccine (SHINGRIX) 50 MCG/0.5ML SUSR injection     Sig: Inject 0.5 mLs into the muscle once for 1 dose 50 MCG IM then repeat 2-6 months.     Dispense:  1 each     Refill:  1   ??? cetirizine (ZYRTEC ALLERGY) 10 MG tablet     Sig: Take 1 tablet by mouth daily     Dispense:  90 tablet     Refill:  3   ??? citalopram (CELEXA) 20 MG tablet     Sig: Take 1 tablet by mouth daily     Dispense:  30 tablet     Refill:  1       Patient given educational materials - see patient instructions.  Discussed use, benefit, and side effects of prescribed medications.  All patient questions answered.  Pt voiced understanding.  Reviewed health maintenance.            Electronically signed by [name], DO on 10/01/2017 at 9:37 PM

## 2017-09-13 NOTE — Progress Notes (Signed)
Medicare Annual Wellness Visit  Name: Katie Juarez Today???s Date: 09/13/2017   MRN: [id number] Sex: Female   Age: 77 y.o. Ethnicity: Non-Hispanic/Non Latino   DOB: May 08, 1940 Race: Katie Juarez is here for Medicare AWV; Hyperlipidemia (5 month follow up); and Other (constipation and diarrhea)    Screenings for behavioral, psychosocial and functional/safety risks, and cognitive dysfunction are all negative except as indicated below. These results, as well as other patient data from the [place] form, are documented in Flowsheets linked to this Encounter.    Allergies   Allergen Reactions   ??? Morphine Other (See Comments)     Night sweats,felt "loopy".   ??? Pcn [Penicillins] Hives   ??? Zithromax [Azithromycin] Hives       Prior to Visit Medications    Medication Sig Taking? Authorizing Provider   Tdap (ADACEL) 05-11-13.5 LF-MCG/0.5 injection Inject 0.5 mLs into the muscle once for 1 dose Yes Leandrew Koyanagi Arrie Zuercher, DO   zoster recombinant adjuvanted vaccine [facility]) 50 MCG/0.5ML SUSR injection Inject 0.5 mLs into the muscle once for 1 dose 50 MCG IM then repeat 2-6 months. Yes Leandrew Koyanagi Eliyah Mcshea, DO   LORazepam (ATIVAN) 0.5 MG tablet TAKE 1 TABLET BY MOUTH NIGHTLY AS NEEDED FOR ANXIETY Yes Leandrew Koyanagi Audrena Talaga, DO   cetirizine (ZYRTEC ALLERGY) 10 MG tablet Take 1 tablet by mouth daily Yes Jessica A Hermiller, APRN - CNP   budesonide-formoterol (SYMBICORT) 160-4.5 MCG/ACT AERO Inhale 1 puff into the lungs 2 times daily Yes Leandrew Koyanagi Fabyan Loughmiller, DO   Multiple Vitamins-Minerals (THRIVE FOR LIFE WOMENS) TABS Take by mouth Yes Historical Provider, MD   albuterol sulfate HFA (VENTOLIN HFA) 108 (90 Base) MCG/ACT inhaler Ventolin HFA 90 mcg/actuation aerosol inhaler   Inhale 2 puffs every 6 hours by inhalation route as needed.   ASTHMA Yes Historical Provider, MD   docusate sodium (COLACE) 100 MG capsule Take 100 mg by mouth 2 times daily Yes Historical Provider, MD   atorvastatin (LIPITOR) 20 MG tablet Take 1 tablet by  mouth daily Yes Leandrew Koyanagi Carmelle [name], DO   citalopram (CELEXA) 10 MG tablet Take 1 tablet by mouth nightly Yes Leandrew Koyanagi Rayvon Dakin, DO   aspirin 81 MG tablet Take 81 mg by mouth daily. Yes Historical Provider, MD   polyvinyl alcohol-povidone (HYPOTEARS) 1.4-0.6 % ophthalmic solution Place 1-2 drops into both eyes as needed. Yes Historical Provider, MD   omeprazole (PRILOSEC) 20 MG delayed release capsule Take 2 capsules by mouth daily  Patient not taking: Reported on 09/13/2017  Vaughan Browner, MD   fluticasone [facility]) 50 MCG/ACT nasal spray 1 spray by Nasal route daily  Jessica A Hermiller, APRN - CNP   omeprazole (PRILOSEC) 40 MG delayed release capsule Take 1 capsule by mouth daily  Patient not taking: Reported on 09/13/2017  [name], DO       Past Medical History:   Diagnosis Date   ??? Breast cancer ([place]) 1999    in remission   ??? DDD (degenerative disc disease)    ??? Large bowel obstruction ([place]) 02/2015    [facility]   ??? LBP (low back pain)    ??? SS (spinal stenosis)      Past Surgical History:   Procedure Laterality Date   ??? APPENDECTOMY  1956   ??? BLADDER SUSPENSION  1990   ??? BREAST LUMPECTOMY Right 1999   ??? CHOLECYSTECTOMY  1990   ??? HYSTERECTOMY  1980    Done  for endometriosis; done in [place]   ??? OTHER SURGICAL HISTORY  11/14/13, 06/23/11, 06/30/11, 07/12/11 12/29/11    L4/L5 IESI   ??? OTHER SURGICAL HISTORY  12/17/13    L5/S1 IESI   ??? OTHER SURGICAL HISTORY Bilateral 05/30/14    SIJ and piriformis   ??? OTHER SURGICAL HISTORY Bilateral 09/16/2014    L3, L4, L5 Diagnostic Medial Branch Block   ??? OTHER SURGICAL HISTORY Bilateral 09/26/2014    L5 TFE   ??? OTHER SURGICAL HISTORY  10/10/2014    bil L5 TFE   ??? OTHER SURGICAL HISTORY  10/31/2014    caudal   ??? TONSILLECTOMY  1946       Family History   Problem Relation Age of Onset   ??? Other Mother         ALS - diagnosed at age 74   ??? Stroke Father         age 20   ??? Cancer Sister         pt thinks it was liver   ??? Cancer Brother         leukemia   ??? Diabetes Maternal Grandfather     ??? Other Brother         drowned   ??? Other Brother         died at age 33; think due to spinal meningitis   ??? Lupus Sister    ??? Alzheimer's Disease Sister    ??? Dementia Sister        CareTeam (Including outside providers/suppliers regularly involved in providing care):   Patient Care Team:  [name], DO as PCP - General (Family Medicine)  [name], DO as PCP - [facility] Empaneled Provider  Brien Few, MD as Consulting Physician (Physical Medicine and Rehab)    Wt Readings from Last 3 Encounters:   09/13/17 147 lb (66.7 kg)   06/23/17 154 lb (69.9 kg)   05/26/17 154 lb (69.9 kg)     Vitals:    09/13/17 0819   BP: 122/82   Pulse: 68   SpO2: 96%   Weight: 147 lb (66.7 kg)   Height: 5\' 4"  (1.626 m)     Body mass index is 25.23 kg/m??.    Based upon direct observation of the patient, evaluation of cognition reveals recent and remote memory intact.     Patient's complete Health Risk Assessment and screening values have been reviewed and are found in Flowsheets. The following problems were reviewed today and where indicated follow up appointments were made and/or referrals ordered.    Positive Risk Factor Screenings with Interventions:     General Health:  General  In general, how would you say your health is?: Good  In the past 7 days, have you experienced any of the following? New or Increased Pain, New or Increased Fatigue, Loneliness, Social Isolation, Stress or Anger?: (!) New or Increased Pain, Stress, Anger  Do you get the social and emotional support that you need?: Yes  Do you have a Living Will?: (!) No  General Health Risk Interventions:  ?? No Living Will: provided the state-specific advance directive document to the patient  Safety Interventions:  ?? none needed    Personalized Preventive Plan   Current Health Maintenance Status  Immunization History   Administered Date(s) Administered   ??? Influenza Virus Vaccine 10/28/2011, 09/29/2012, 09/18/2013, 10/23/2014, 09/30/2015, 09/14/2016   ??? Influenza,  Triv, inactivated, subunit, adjuvanted, IM (Fluad 65 yrs and older) 09/14/2016   ???  Pneumococcal Conjugate 13-valent (Prevnar13) 09/18/2013   ??? Pneumococcal Polysaccharide (Pneumovax23) 05/21/2009, 05/27/2015        Health Maintenance   Topic Date Due   ??? DTaP/Tdap/Td vaccine (1 - Tdap) 06/09/1958   ??? Shingles Vaccine (1 of 2) 06/08/1989   ??? Annual Wellness Visit (AWV)  06/09/2002   ??? DEXA (modify frequency per FRAX score)  06/08/2004   ??? Flu vaccine (1) 09/10/2017   ??? Pneumococcal 65+ years Vaccine  Completed     Recommendations for Preventive Services Due: see orders and patient instructions/AVS.  .  Recommended screening schedule for the next 5-10 years is provided to the patient in written form: see Patient Instructions/AVS.    Enis was seen today for medicare awv, hyperlipidemia and other.    Diagnoses and all orders for this visit:    Routine general medical examination at a health care facility    Environmental and seasonal allergies    Post-menopausal  -     DEXA Bone Density Axial Skeleton; Future    Need for prophylactic vaccination against diphtheria-tetanus-pertussis (DTP)  -     Tdap (ADACEL) 05-11-13.5 LF-MCG/0.5 injection; Inject 0.5 mLs into the muscle once for 1 dose    Need for prophylactic vaccination and inoculation against varicella  -     zoster recombinant adjuvanted vaccine [facility]) 50 MCG/0.5ML SUSR injection; Inject 0.5 mLs into the muscle once for 1 dose 50 MCG IM then repeat 2-6 months.

## 2017-09-13 NOTE — Patient Instructions (Signed)
Personalized Preventive Plan for CHEVY VIRGO - 09/13/2017  Medicare offers a range of preventive health benefits. Some of the tests and screenings are paid in full while other may be subject to a deductible, co-insurance, and/or copay.    Some of these benefits include a comprehensive review of your medical history including lifestyle, illnesses that may run in your family, and various assessments and screenings as appropriate.    After reviewing your medical record and screening and assessments performed today your provider may have ordered immunizations, labs, imaging, and/or referrals for you.  A list of these orders (if applicable) as well as your Preventive Care list are included within your After Visit Summary for your review.    Other Preventive Recommendations:    ?? A preventive eye exam performed by an eye specialist is recommended every 1-2 years to screen for glaucoma; cataracts, macular degeneration, and other eye disorders.  ?? A preventive dental visit is recommended every 6 months.  ?? Try to get at least 150 minutes of exercise per week or 10,000 steps per day on a pedometer .  ?? Order or download the FREE "Exercise & Physical Activity: Your Everyday Guide" from [employer]. Call (626) [phone] or search [employer] online.  ?? You need 1200-1500 mg of calcium and 1000-2000 IU of vitamin D per day. It is possible to meet your calcium requirement with diet alone, but a vitamin D supplement is usually necessary to meet this goal.  ?? When exposed to the sun, use a sunscreen that protects against both UVA and UVB radiation with an SPF of 30 or greater. Reapply every 2 to 3 hours or after sweating, drying off with a towel, or swimming.  ?? Always wear a seat belt when traveling in a car. Always wear a helmet when riding a bicycle or motorcycle.

## 2017-09-14 ENCOUNTER — Ambulatory Visit: Admit: 2017-09-14 | Discharge: 2017-09-14 | Payer: MEDICARE | Attending: Family Medicine | Primary: Family Medicine

## 2017-09-14 DIAGNOSIS — J40 Bronchitis, not specified as acute or chronic: Secondary | ICD-10-CM

## 2017-09-14 MED ORDER — LEVOFLOXACIN 500 MG PO TABS
500 MG | ORAL_TABLET | Freq: Every day | ORAL | 0 refills | Status: AC
Start: 2017-09-14 — End: 2017-09-21

## 2017-09-14 NOTE — Progress Notes (Signed)
[facility]             [address] [place], [place]                        Telephone [phone]             Fax [phone]       Katie Juarez  February 26, 1940  MRN:  [id number]  Date of visit:  09/14/2017     Subjective:    Katie Juarez is a 77 y.o. Caucasian female who presents to [facility] today (09/14/2017) for evaluation of:  Sinus Problem (sneezing, cough, runny nose, chills, headache, sinus, dry throat. started yesterday)      She states that she has had a cough, sneezing, and a sore throat since yesterday.  She states that she has had chills and sweats.  She has not checked her temperature at home.  She states that her symptoms are worse today.  She reports some congestion in her chest.   She reports some mild shortness of breath.  She states that she took over the counter medication without any significant improvement in her symptoms.      Current medications are:  Current Outpatient Medications   Medication Sig Dispense Refill   ??? cetirizine (ZYRTEC ALLERGY) 10 MG tablet Take 1 tablet by mouth daily 90 tablet 3   ??? citalopram (CELEXA) 20 MG tablet Take 1 tablet by mouth daily 30 tablet 1   ??? LORazepam (ATIVAN) 0.5 MG tablet TAKE 1 TABLET BY MOUTH NIGHTLY AS NEEDED FOR ANXIETY 90 tablet 0   ??? budesonide-formoterol (SYMBICORT) 160-4.5 MCG/ACT AERO Inhale 1 puff into the lungs 2 times daily 3 Inhaler 3   ??? Multiple Vitamins-Minerals (THRIVE FOR LIFE WOMENS) TABS Take by mouth     ??? albuterol sulfate HFA (VENTOLIN HFA) 108 (90 Base) MCG/ACT inhaler Ventolin HFA 90 mcg/actuation aerosol inhaler   Inhale 2 puffs every 6 hours by inhalation route as needed.   ASTHMA     ??? docusate sodium (COLACE) 100 MG capsule Take 100 mg by mouth 2 times daily     ??? atorvastatin (LIPITOR) 20 MG tablet Take 1 tablet by mouth daily 90 tablet 3   ??? omeprazole (PRILOSEC) 40 MG delayed release capsule Take 1 capsule by mouth daily 90  capsule 3   ??? aspirin 81 MG tablet Take 81 mg by mouth daily.     ??? polyvinyl alcohol-povidone (HYPOTEARS) 1.4-0.6 % ophthalmic solution Place 1-2 drops into both eyes as needed.     ??? fluticasone (FLONASE) 50 MCG/ACT nasal spray 1 spray by Nasal route daily 1 Bottle 0     No current facility-administered medications for this visit.        She is allergic to morphine; pcn [penicillins]; and zithromax [azithromycin].    She has the following problem list:  Patient Active Problem List   Diagnosis   ??? SS (spinal stenosis)   ??? DDD (degenerative disc disease)   ??? Lumbar facet joint syndrome ([place])   ??? 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 Lumbosacral stenosis   ??? 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 Pain of both sacroiliac joints   ??? Piriformis syndrome of left side   ??? Piriformis syndrome   ??? Lumbar radicular pain   ??? Neural foraminal stenosis of lumbar spine   ??? Chronic  low back pain   ??? Spinal stenosis, lumbar        She  reports that she quit smoking about 29 years ago. She started smoking about 64 years ago. She has a 40.00 pack-year smoking history. She has never used smokeless tobacco.      Objective:    Vitals:    09/14/17 1700   BP: 120/62   Pulse: 64   Resp: 16   Temp: 99.2 ??F (37.3 ??C)   SpO2: 95%   Weight: 149 lb (67.6 kg)   Height: 5\' 5"  (1.651 m)      SpO2: 95 %       Body mass index is 24.79 kg/m??.    Well-nourished, well-developed elderly Caucasian female alert and cooperative.  The tympanic membranes are clear bilaterally.  Oropharynx has none erythema.  There is no exudate.  There is moderate tenderness over the frontal and maxillary sinuses bilaterally.  Neck supple. No adenopathy.  Chest:  Normal expansion.  Clear to auscultation.  No rales, rhonchi, or wheezing.  Respirations are not labored.   Heart sounds are normal.  Regular rate and rhythm without murmur, gallop or rub.        Assessment & Plan:    1. Bronchitis  2. Acute pansinusitis, recurrence not  specified  - levofloxacin (LEVAQUIN) 500 MG tablet; Take 1 tablet by mouth daily for 7 days  Dispense: 7 tablet; Refill: 0    She was advised to follow up if symptoms worsen or do not resolve.       (Please note that portions of this note were completed with a voice-recognition program. Efforts were made to edit the dictation but occasionally words are mis-transcribed.)

## 2017-09-15 ENCOUNTER — Encounter: Attending: Family Medicine | Primary: Family Medicine

## 2017-09-26 NOTE — Telephone Encounter (Signed)
Pt calling stating her celexa was increased to 20mg  and she was told to call and let KB know how it's working, pt states she feels the 20mg  is too strong and it makes her tired, pt states she would like to go back to the 10mg , please send a refill to loaded pharmacy. Pt also states she knows she's due for labs but has had a sinus infection and it waiting til she feels better and then she will have the labs.

## 2017-09-26 NOTE — Telephone Encounter (Signed)
Kateryna says she is already taking Celexa at bedtime but she feels tired & drained in the mornings. She plans on cutting the 20 mg tabs in half & will let us know when she needs the 10 mg again.  Just finished Levaquin for sinus.

## 2017-09-27 LAB — COMPREHENSIVE METABOLIC PANEL
ALT: 19 units/L (ref 9–52)
AST: 26 units/L (ref 14–36)
Age: 78
Albumin/Globulin Ratio: 1.4 ratio
Albumin: 4 g/dL (ref 3.5–5.0)
Alk Phosphatase: 63 units/L (ref 38–126)
Anion Gap: 11 mmol/L
BUN: 20 mg/dL — ABNORMAL HIGH (ref 7–17)
CO2: 29 mmol/L (ref 22–31)
Calcium: 9.1 mg/dL (ref 8.4–10.2)
Chloride: 106 mmol/L (ref 98–120)
Creatinine: 1 mg/dL (ref 0.5–1.0)
Globulin: 2.8 g/dL
Glucose: 94 mg/dL (ref 65–105)
Potassium: 4.3 mmol/L (ref 3.6–5.0)
Sodium: 142 mmol/L (ref 135–145)
Total Bilirubin: 0.5 mg/dL (ref 0.2–1.3)
Total Protein: 6.8 g/dL (ref 6.3–8.2)
eGFR BF: 65 mL/min/{1.73_m2}
eGFR BM: 88 mL/min/{1.73_m2}
eGFR WF: 54 mL/min/{1.73_m2}
eGFR WM: 72 mL/min/{1.73_m2}

## 2017-09-27 LAB — LIPID PANEL
Chol/HDL Ratio: 2.6 ratio (ref 0.0–4.5)
Cholesterol: 129 mg/dL (ref 50–200)
HDL, DIRECT: 50 mg/dL (ref 36–68)
LDL Calculated: 43.4 mg/dL (ref >=0.0)
Triglycerides: 178 mg/dL (ref 10–250)
VLDL: 36 mg/dL (ref 0–40)

## 2017-09-28 NOTE — Telephone Encounter (Signed)
Left message informing pt.  To return call if any questions.

## 2017-09-28 NOTE — Telephone Encounter (Signed)
Noted, will wait to hear from pt when she needs the 10 mg tabs refilled, since she will cut 20 mg tabs in 1/2 for now.    Would pt like to try increasing her Ativan to 0.5 mg twice daily as needed for anxiety, since the increase in Celexa did not work as well for her?  This would only be if she feels she is still having increased anxiety, as we discussed at her OV.  Please warn pt that Ativan can also make her drowsy, dizzy, etc.

## 2017-10-26 ENCOUNTER — Encounter

## 2017-11-20 ENCOUNTER — Encounter

## 2017-11-20 NOTE — Telephone Encounter (Signed)
Last appt 09-13-17.  Next appt 12-13-17.  Lorazepam last filled Aug 22 for 390 per OARRS.  Pt is not due until Nov 20.

## 2017-11-28 MED ORDER — LORAZEPAM 0.5 MG PO TABS
0.5 MG | ORAL_TABLET | ORAL | 0 refills | Status: DC
Start: 2017-11-28 — End: 2018-03-13

## 2017-11-28 NOTE — Telephone Encounter (Signed)
Controlled Substance Monitoring:    Acute and Chronic Pain Monitoring:   RX Monitoring 11/28/2017   Periodic Controlled Substance Monitoring No signs of potential drug abuse or diversion identified.

## 2017-12-13 ENCOUNTER — Ambulatory Visit: Admit: 2017-12-13 | Discharge: 2017-12-13 | Payer: MEDICARE | Attending: Family Medicine | Primary: Family Medicine

## 2017-12-13 DIAGNOSIS — F419 Anxiety disorder, unspecified: Secondary | ICD-10-CM

## 2017-12-13 MED ORDER — CITALOPRAM HYDROBROMIDE 20 MG PO TABS
20 MG | ORAL_TABLET | Freq: Every day | ORAL | 1 refills | Status: DC
Start: 2017-12-13 — End: 2018-06-20

## 2017-12-13 NOTE — Patient Instructions (Signed)
Patient Education        Muscle Cramps: Care Instructions  Your Care Instructions    A muscle cramp occurs when a muscle tightens up suddenly. A cramp often happens in the legs. A muscle cramp is also called a muscle spasm or a charley horse.  Muscle cramps usually last less than a minute. However, the pain may last for several minutes. Leg cramps that occur at night may wake you up.  Heavy exercise, dehydration, and being overweight can increase your risk of getting cramps. An imbalance of certain chemicals in your blood, called electrolytes, can also lead to muscle cramps. Pregnant women sometimes get muscle cramps during sleep.  Muscle cramps can be treated by stretching and massaging the muscle. If cramps keep coming back, your doctor may prescribe medicine that relaxes your muscles.  Follow-up care is a key part of your treatment and safety. Be sure to make and go to all appointments, and call your doctor if you are having problems. It's also a good idea to know your test results and keep a list of the medicines you take.  How can you care for yourself at home?  ?? Drink plenty of fluids to prevent dehydration. Choose water and other caffeine-free clear liquids until you feel better. If you have kidney, heart, or liver disease and have to limit fluids, talk with your doctor before you increase the amount of fluids you drink.  ?? Stretch your muscles every day, especially before and after exercise and at bedtime. Regular stretching can relax your muscles and may prevent cramps.  ?? Do not suddenly increase the amount of exercise you get. Increase your exercise a little each week.  ?? When you get a cramp, stretch and massage the muscle. You can also take a warm shower or bath to relax the muscle. A heating pad placed on the muscle can also help.  ?? Take a daily multivitamin supplement.  ?? Ask your doctor if you can take an over-the-counter pain medicine, such as acetaminophen (Tylenol), ibuprofen (Advil, Motrin),  or naproxen (Aleve). Be safe with medicines. Read and follow all instructions on the label.  When should you call for help?  Watch closely for changes in your health, and be sure to contact your doctor if:  ?? ?? You get muscle cramps often that do not go away after home treatment.   ?? ?? Your muscle cramps often wake you up at night.   ?? ?? You do not get better as expected.   Where can you learn more?  Go to [email or web address] and sign in to your MyChart account. Enter I565 in the Search Health Information box to learn more about "Muscle Cramps: Care Instructions."     If you do not have an account, please click on the "Sign Up Now" link.  Current as of: September 29, 2016  Content Version: 12.1  ?? 2006-2019 [employer]. Care instructions adapted under license by [facility]. If you have questions about a medical condition or this instruction, always ask your healthcare professional. [employer] disclaims any warranty or liability for your use of this information.

## 2017-12-13 NOTE — Progress Notes (Signed)
[facility]  [address], [place] [address]  [phone]      Katie Juarez is a 77 y.o. female who presents today for her medical conditions/complaints as noted below.  Katie Juarez is c/o of 3 Month Follow-Up (anxiety; CSA) and Other (leg cramps at night; left knee pain)      HPI:     Taking Celexa 10 mg nightly (1/2 of a 20 mg tab) for depression/anxiety; had increased dose from 10 mg to 20 mg after last OV in 09/2017 - admits that she felt more "relaxed", but felt that it almost made her too tired, so she went back to to 10 mg (1/2 tab).  Wants to stay with the 20 mg tabs, in case that she wants to increase again this winter, as she is under a lot of stress with her husband and dog's health issues.    Also using Lorazepam 0.5 mg nightly; does not use during the day.  Feels this does work well for her anxiety.    Pt needs form completed for upcoming air travel to have her dog as an emotional support animal on her trip due to her anxiety.  Anxiety is chronic - pt has h/o a nervous breakdown after the birth of her 3rd child; had ECT treatments x 14 in the 1960's.    Pt stopped her Omeprazole a couple months ago - did not see any benefit from it.  Still felt she was coughing and bringing up mucus; can also regurgitate cold liquid, if she drinks this in the evening.  Sometimes at night, her throat feels "full".  Did not see any worsening of symptoms after she stopped the Omeprazole.    Still having L knee pain - maybe a little worse than last visit.  Sometimes just the medial knee hurts, and other times, it can go down her lower leg.  Taking Ibuprofen 400 mg each evening, which seems to help.      Pt will be going to [place] for the winter - leaves next Wednesday and will be back in early April.        Past Medical History:   Diagnosis Date   ??? Breast cancer ([place]) 1999    in remission   ??? DDD (degenerative disc disease)    ??? Large bowel obstruction ([place]) 02/2015    [facility]   ??? LBP  (low back pain)    ??? SS (spinal stenosis)       Past Surgical History:   Procedure Laterality Date   ??? APPENDECTOMY  1956   ??? BLADDER SUSPENSION  1990   ??? BREAST LUMPECTOMY Right 1999   ??? CHOLECYSTECTOMY  1990   ??? HYSTERECTOMY  1980    Done for endometriosis; done in [place]   ??? OTHER SURGICAL HISTORY  11/14/13, 06/23/11, 06/30/11, 07/12/11 12/29/11    L4/L5 IESI   ??? OTHER SURGICAL HISTORY  12/17/13    L5/S1 IESI   ??? OTHER SURGICAL HISTORY Bilateral 05/30/14    SIJ and piriformis   ??? OTHER SURGICAL HISTORY Bilateral 09/16/2014    L3, L4, L5 Diagnostic Medial Branch Block   ??? OTHER SURGICAL HISTORY Bilateral 09/26/2014    L5 TFE   ??? OTHER SURGICAL HISTORY  10/10/2014    bil L5 TFE   ??? OTHER SURGICAL HISTORY  10/31/2014    caudal   ??? TONSILLECTOMY  1946     Family History   Problem Relation Age of Onset   ???  Other Mother         ALS - diagnosed at age 59   ??? Stroke Father         age 63   ??? Cancer Sister         pt thinks it was liver   ??? Cancer Brother         leukemia   ??? Diabetes Maternal Grandfather    ??? Other Brother         drowned   ??? Other Brother         died at age 45; think due to spinal meningitis   ??? Lupus Sister    ??? Alzheimer's Disease Sister    ??? Dementia Sister      Social History     Tobacco Use   ??? Smoking status: Former Smoker     Packs/day: 1.00     Years: 40.00     Pack years: 40.00     Start date: 01/10/1953     Last attempt to quit: 01/11/1988     Years since quitting: 29.9   ??? Smokeless tobacco: Never Used   ??? Tobacco comment: quit 25 years ago 1990   Substance Use Topics   ??? Alcohol use: No     Alcohol/week: 0.0 standard drinks     Current Outpatient Medications   Medication Sig Dispense Refill   ??? citalopram (CELEXA) 20 MG tablet Take 1 tablet by mouth daily 90 tablet 1   ??? LORazepam (ATIVAN) 0.5 MG tablet TAKE 1 TABLET BY MOUTH NIGHTLY AS NEEDED FOR ANXIETY 90 tablet 0   ??? cetirizine (ZYRTEC ALLERGY) 10 MG tablet Take 1 tablet by mouth daily 90 tablet 3   ??? budesonide-formoterol (SYMBICORT) 160-4.5  MCG/ACT AERO Inhale 1 puff into the lungs 2 times daily 3 Inhaler 3   ??? Multiple Vitamins-Minerals (THRIVE FOR LIFE WOMENS) TABS Take by mouth     ??? albuterol sulfate HFA (VENTOLIN HFA) 108 (90 Base) MCG/ACT inhaler Ventolin HFA 90 mcg/actuation aerosol inhaler   Inhale 2 puffs every 6 hours by inhalation route as needed.   ASTHMA     ??? docusate sodium (COLACE) 100 MG capsule Take 100 mg by mouth 2 times daily     ??? atorvastatin (LIPITOR) 20 MG tablet Take 1 tablet by mouth daily 90 tablet 3   ??? aspirin 81 MG tablet Take 81 mg by mouth daily.     ??? polyvinyl alcohol-povidone (HYPOTEARS) 1.4-0.6 % ophthalmic solution Place 1-2 drops into both eyes as needed.     ??? fluticasone (FLONASE) 50 MCG/ACT nasal spray 1 spray by Nasal route daily 1 Bottle 0     No current facility-administered medications for this visit.      Allergies   Allergen Reactions   ??? Morphine Other (See Comments)     Night sweats,felt "loopy".   ??? Pcn [Penicillins] Hives   ??? Zithromax [Azithromycin] Hives       Health Maintenance   Topic Date Due   ??? Shingles Vaccine (1 of 2) 06/08/1989   ??? Annual Wellness Visit (AWV)  09/13/2018   ??? Lipid screen  09/28/2018   ??? DTaP/Tdap/Td vaccine (2 - Td) 09/28/2027   ??? DEXA (modify frequency per FRAX score)  Completed   ??? Flu vaccine  Completed   ??? Pneumococcal 65+ years Vaccine  Completed       Subjective:      Review of Systems   Constitutional: Negative for fever.   Cardiovascular: Negative  for palpitations.   Gastrointestinal: Negative for blood in stool and vomiting.        Has more gas and abdominal gurgling   Genitourinary: Negative for dysuria and hematuria.   Musculoskeletal: Positive for back pain (had an episode overnight of upper thoracic pain when she got up to go to the bathroom; lasted 15 mins or so.  Also went into her L shoulder - this still hurts intermittently.) and myalgias (b/l lower legs; happens at night in bed, maybe a few times per week.  Has to get up and walk around.).    Psychiatric/Behavioral: The patient is nervous/anxious (stable).        Objective:     Vitals:    12/13/17 0926   BP: 124/70   Pulse: 68   SpO2: 97%   Weight: 146 lb (66.2 kg)   Height: 5\' 4"  (1.626 m)     Physical Exam  Constitutional:       General: She is not in acute distress.     Appearance: She is well-developed.   HENT:      Head: Normocephalic and atraumatic.   Eyes:      Conjunctiva/sclera: Conjunctivae normal.   Neck:      Musculoskeletal: Neck supple.   Cardiovascular:      Rate and Rhythm: Normal rate and regular rhythm.      Heart sounds: Normal heart sounds.   Pulmonary:      Effort: Pulmonary effort is normal. No respiratory distress.      Breath sounds: Normal breath sounds.   Abdominal:      General: Bowel sounds are normal. There is no distension.      Palpations: Abdomen is soft.      Tenderness: There is no tenderness.   Skin:     General: Skin is warm and dry.   Neurological:      Mental Status: She is alert and oriented to person, place, and time.         Assessment:       Diagnosis Orders   1. Anxiety  citalopram (CELEXA) 20 MG tablet   2. Centrilobular emphysema ([place])     3. Hyperlipidemia, unspecified hyperlipidemia type     4. Acute pain of left knee     5. Nocturnal muscle cramp     6. Lesion of skin of nose  [facility] - Marja Kays, Pricilla Handler, MD, Dermatology, [place]       Plan:      Pt is on call list for [facility] for Shingrix vaccine.      Return in about 19 weeks (around 04/25/2018) for follow-up of anxiety, HLD.    Orders Placed This Encounter   Procedures   ??? [facility] - Marja Kays, Pricilla Handler, MD, Dermatology, [place]     Referral Priority:   Routine     Referral Type:   Eval and Treat     Referral Reason:   Specialty Services Required     Referred to Provider:   Abagail Kitchens, MD     Requested Specialty:   Dermatology     Number of Visits Requested:   1     Orders Placed This Encounter   Medications   ??? citalopram (CELEXA) 20 MG tablet     Sig: Take 1 tablet by mouth daily     Dispense:  90 tablet      Refill:  1       Patient given educational materials - see patient instructions.  Discussed use, benefit,  and side effects of prescribed medications.  All patient questions answered.  Pt voiced understanding.  Reviewed health maintenance.            Electronically signed by [name], DO on 12/24/2017 at 11:49 PM

## 2017-12-18 ENCOUNTER — Encounter

## 2017-12-18 NOTE — Telephone Encounter (Signed)
Pt states she needs this by Wednesday as they leave for [place].

## 2017-12-18 NOTE — Telephone Encounter (Signed)
Filled 11/2017 for #90.    Pt informed, will call pharmacy.

## 2017-12-20 ENCOUNTER — Encounter: Attending: Family Medicine | Primary: Family Medicine

## 2018-02-20 ENCOUNTER — Encounter

## 2018-02-20 NOTE — Telephone Encounter (Signed)
Last appt 12-13-17.  Next appt 04-25-18.  Lorazepam last filled Dec 8 for #90 per [place].  Left message to call back on why pt is asking for CS med early.  She should have taken her supply from [place] with her to [place].

## 2018-03-13 ENCOUNTER — Encounter

## 2018-03-13 NOTE — Telephone Encounter (Addendum)
Pt requesting refill of ativan be sent to [facility] in [place], loaded.    Last Appt:  04/24/2017  Next Appt:   04/25/2018  Med verified in Epic    Per [place], last filled 12/17/2017, #90/90 days  Due 03/19/2018

## 2018-03-16 NOTE — Telephone Encounter (Signed)
Rx refilled - due to fill tomorrow (3/7).      Controlled Substance Monitoring:    Acute and Chronic Pain Monitoring:   RX Monitoring 03/16/2018   Periodic Controlled Substance Monitoring No signs of potential drug abuse or diversion identified.   Chronic Pain > 80 MEDD Obtained or confirmed "Medication Contract" on file.

## 2018-03-16 NOTE — Telephone Encounter (Signed)
Pt calling on status of this refill, pt would like this refilled this weekend

## 2018-03-16 NOTE — Telephone Encounter (Signed)
Daughter Paschal Dopp calledDurene Cal why her mother's Ativan has not been refilled yet.  Explained she should not be due yet & it's our policy not be refilled controlled substances early.  States "My mother told me she's completely out".  Offered to call [facility] which I did & Bryndle said "I have 3 days worth of pills left, but I wanted to get the prescription today because I was going to town".   Explained that we will fill the prescription when it is due.  Pt voiced understanding.  Believe it may be Sunday, March 8?

## 2018-03-17 MED ORDER — LORAZEPAM 0.5 MG PO TABS
0.5 MG | ORAL_TABLET | ORAL | 0 refills | Status: DC
Start: 2018-03-17 — End: 2018-06-20

## 2018-04-16 ENCOUNTER — Encounter

## 2018-04-16 NOTE — Telephone Encounter (Signed)
Last appt 12-13-17.  Next appt 04-25-18.

## 2018-04-16 NOTE — Telephone Encounter (Signed)
Duplicate request.

## 2018-04-17 NOTE — Telephone Encounter (Signed)
Appt moved up to 4/8 (tomorrow) - will refill med after speaking with pt in virtual visit.

## 2018-04-18 ENCOUNTER — Telehealth: Admit: 2018-04-18 | Discharge: 2018-04-18 | Payer: MEDICARE | Attending: Family Medicine | Primary: Family Medicine

## 2018-04-18 DIAGNOSIS — K219 Gastro-esophageal reflux disease without esophagitis: Secondary | ICD-10-CM

## 2018-04-18 MED ORDER — ATORVASTATIN CALCIUM 20 MG PO TABS
20 MG | ORAL_TABLET | Freq: Every day | ORAL | 1 refills | Status: DC
Start: 2018-04-18 — End: 2018-11-05

## 2018-04-18 NOTE — Progress Notes (Signed)
04/18/2018    TELEHEALTH EVALUATION -- Audio/Visual (During ZOXWR-60 public health emergency)    HPI:    Katie Juarez (DOB:  1940/10/10) has requested an audio/video evaluation for the following concern(s): follow-up cough, anxiety    Still coughing and then a clear liquid will come up; unsure if it is sinus drainage or reflux.  One day, she thinks she inhaled some of it, and it "burned".  Thin, like water.  Ice water will make it worse.  Will start out sneezing, then coughs deeply, then the liquid will come up.  Almost feels like she is vomited.    No burning in her chest or bad taste in her mouth.  No post-nasal drainage.  Re-started Omeprazole 20 mg daily in the mornings approx 1 week ago - has not seen a difference yet.  Also using Flonase intermittently.    Had EGD in [place] a few years ago - was told that it was "normal".      Starting to have pain in her L knee; has also had some swelling.  Went to Dr. Claris Che in [place] - had a Cortisone injection, which didn't last long, so then she had an injection of gel, which also did not help much.  Had x-ray, and was told that her knee was "wore out".      Using Ibuprofen 200 mg BID, Aleve and ice.  No recent injury to the knee.  Keeping her from being able walk as much as she would like for exercise.      Taking Celexa 10-20 mg nightly (depending on what kind of day she had) and Ativan 0.5 mg nightly for anxiety - stable.    Taking Lipitor for HLD - stable.  Taking ASA only 3x's week.    Using Symbicort BID and Albuterol inhaler as directed - stable.        Review of Systems   Constitutional: Negative for fever.   HENT: Negative for sore throat.    Respiratory: Positive for cough (with the reflux). Negative for chest tightness and shortness of breath.    Cardiovascular: Negative for palpitations.   Gastrointestinal: Negative for blood in stool.   Genitourinary: Negative for dysuria and hematuria.   Neurological: Positive for dizziness (sometimes when she changes  position quickly). Negative for light-headedness.   Psychiatric/Behavioral: Positive for dysphoric mood (stable). The patient is nervous/anxious (stable).        Prior to Visit Medications    Medication Sig Taking? Authorizing Provider   LORazepam (ATIVAN) 0.5 MG tablet TAKE 1 TABLET BY MOUTH NIGHTLY AS NEEDED FOR ANXIETY Yes Leandrew Koyanagi Semaj Kham, DO   citalopram (CELEXA) 20 MG tablet Take 1 tablet by mouth daily Yes Leandrew Koyanagi Sada Mazzoni, DO   budesonide-formoterol (SYMBICORT) 160-4.5 MCG/ACT AERO Inhale 1 puff into the lungs 2 times daily Yes Leandrew Koyanagi Sweta Halseth, DO   Multiple Vitamins-Minerals (THRIVE FOR LIFE WOMENS) TABS Take by mouth Yes Historical Provider, MD   albuterol sulfate HFA (VENTOLIN HFA) 108 (90 Base) MCG/ACT inhaler Ventolin HFA 90 mcg/actuation aerosol inhaler   Inhale 2 puffs every 6 hours by inhalation route as needed.   ASTHMA Yes Historical Provider, MD   docusate sodium (COLACE) 100 MG capsule Take 100 mg by mouth 2 times daily Yes Historical Provider, MD   aspirin 81 MG tablet Take 81 mg by mouth daily. Yes Historical Provider, MD   polyvinyl alcohol-povidone (HYPOTEARS) 1.4-0.6 % ophthalmic solution Place 1-2 drops into both eyes as needed. Yes Historical Provider, MD  atorvastatin (LIPITOR) 20 MG tablet Take 1 tablet by mouth daily  Leandrew Koyanagi Alliah Boulanger, DO   fluticasone (FLONASE) 50 MCG/ACT nasal spray 1 spray by Nasal route daily  Leona Carry Hermiller, APRN - CNP       Social History     Tobacco Use   ??? Smoking status: Former Smoker     Packs/day: 1.00     Years: 40.00     Pack years: 40.00     Start date: 01/10/1953     Last attempt to quit: 01/11/1988     Years since quitting: 30.3   ??? Smokeless tobacco: Never Used   ??? Tobacco comment: quit 25 years ago 1990   Substance Use Topics   ??? Alcohol use: No     Alcohol/week: 0.0 standard drinks   ??? Drug use: Never        Allergies   Allergen Reactions   ??? Morphine Other (See Comments)     Night sweats,felt "loopy".   ??? Pcn [Penicillins] Hives   ??? Zithromax [Azithromycin]  Hives   ,   Past Medical History:   Diagnosis Date   ??? Breast cancer ([place]) 1999    in remission   ??? DDD (degenerative disc disease)    ??? Large bowel obstruction ([place]) 02/2015    [facility]   ??? LBP (low back pain)    ??? SS (spinal stenosis)    ,   Past Surgical History:   Procedure Laterality Date   ??? APPENDECTOMY  1956   ??? BLADDER SUSPENSION  1990   ??? BREAST LUMPECTOMY Right 1999   ??? CHOLECYSTECTOMY  1990   ??? HYSTERECTOMY  1980    Done for endometriosis; done in [place]   ??? OTHER SURGICAL HISTORY  11/14/13, 06/23/11, 06/30/11, 07/12/11 12/29/11    L4/L5 IESI   ??? OTHER SURGICAL HISTORY  12/17/13    L5/S1 IESI   ??? OTHER SURGICAL HISTORY Bilateral 05/30/14    SIJ and piriformis   ??? OTHER SURGICAL HISTORY Bilateral 09/16/2014    L3, L4, L5 Diagnostic Medial Branch Block   ??? OTHER SURGICAL HISTORY Bilateral 09/26/2014    L5 TFE   ??? OTHER SURGICAL HISTORY  10/10/2014    bil L5 TFE   ??? OTHER SURGICAL HISTORY  10/31/2014    caudal   ??? TONSILLECTOMY  1946       PHYSICAL EXAMINATION:  Vital Signs: None were able to be obtained by pt today    Constitutional: [x]  Appears well-developed and well-nourished [x]  No apparent distress      []  Abnormal-   Mental status  [x]  Alert and awake  [x]  Oriented to person/place/time [x] Able to follow commands      Eyes:  EOM    [x]   Normal  []  Abnormal-  Sclera  [x]   Normal  []  Abnormal -         Discharge [x]   None visible  []  Abnormal -    HENT:   [x]  Normocephalic, atraumatic.  []  Abnormal     Neck: [x]  No visualized mass     Pulmonary/Chest: [x]  Respiratory effort normal.  [x]  No visualized signs of difficulty breathing or respiratory distress        []  Abnormal-      Musculoskeletal:        [x]  Normal range of motion of neck        []  Abnormal-       Neurological:        [x]  No  Facial Asymmetry (Cranial nerve 7 motor function) (limited exam to video visit)          [x]  No gaze palsy        []  Abnormal-         Skin:        [x]  No significant exanthematous lesions or discoloration noted on  facial skin         []  Abnormal-            Psychiatric:       [x]  Normal Affect      []  Abnormal-       ASSESSMENT/PLAN:  1. Gastroesophageal reflux disease, esophagitis presence not specified    2. Hyperlipidemia, unspecified hyperlipidemia type    3. Anxiety      Return for Follow-up.    AMORE ACKMAN is a 78 y.o. female being evaluated by a Virtual Visit (video visit) encounter to address concerns as mentioned above.  A caregiver was present when appropriate. Due to this being a Scientist, physiological (During ONGEX-52 public health emergency), evaluation of the following organ systems was limited: Vitals/Constitutional/EENT/Resp/CV/GI/GU/MS/Neuro/Skin/Heme-Lymph-Imm.  Pursuant to the emergency declaration under the [place] waiver authority and the [employer] Act, this Virtual Visit was conducted with patient's (and/or legal guardian's) consent, to reduce the patient's risk of exposure to COVID-19 and provide necessary medical care.  The patient (and/or legal guardian) has also been advised to contact this office for worsening conditions or problems, and seek emergency medical treatment and/or call 911 if deemed necessary.     Services were provided through a video synchronous discussion virtually to substitute for in-person clinic visit. Patient and provider were located at their individual homes.    --[name], DO on 05/01/2018 at 8:01 AM    An electronic signature was used to authenticate this note.

## 2018-04-18 NOTE — Telephone Encounter (Signed)
Med refilled.

## 2018-04-27 ENCOUNTER — Encounter: Attending: Dermatology | Primary: Family Medicine

## 2018-05-07 ENCOUNTER — Encounter

## 2018-05-07 NOTE — Telephone Encounter (Signed)
Last Appt:  04/18/2018  Next Appt:   Visit date not found  Med verified in [place]

## 2018-05-08 MED ORDER — BUDESONIDE-FORMOTEROL FUMARATE 160-4.5 MCG/ACT IN AERO
Freq: Two times a day (BID) | RESPIRATORY_TRACT | 3 refills | Status: DC
Start: 2018-05-08 — End: 2019-04-29

## 2018-06-19 ENCOUNTER — Encounter

## 2018-06-19 NOTE — Telephone Encounter (Signed)
Per [place], last filled 12/28/2018, #90/90 days    Last Appt:  04/24/2017  Next Appt:   06/25/2018  Med verified in [place]

## 2018-06-20 MED ORDER — CITALOPRAM HYDROBROMIDE 20 MG PO TABS
20 MG | ORAL_TABLET | Freq: Every day | ORAL | 1 refills | Status: DC
Start: 2018-06-20 — End: 2018-11-16

## 2018-06-20 MED ORDER — LORAZEPAM 0.5 MG PO TABS
0.5 MG | ORAL_TABLET | ORAL | 0 refills | Status: DC
Start: 2018-06-20 — End: 2018-09-20

## 2018-06-20 NOTE — Telephone Encounter (Signed)
Controlled Substance Monitoring:    Acute and Chronic Pain Monitoring:   RX Monitoring 06/20/2018   Periodic Controlled Substance Monitoring No signs of potential drug abuse or diversion identified.    Obtained or confirmed "Medication Contract" on file.

## 2018-06-20 NOTE — Telephone Encounter (Signed)
duplicate

## 2018-06-25 ENCOUNTER — Ambulatory Visit: Admit: 2018-06-25 | Discharge: 2018-06-25 | Payer: MEDICARE | Attending: Family Medicine | Primary: Family Medicine

## 2018-06-25 ENCOUNTER — Inpatient Hospital Stay: Admit: 2018-06-25 | Payer: MEDICARE | Primary: Family Medicine

## 2018-06-25 ENCOUNTER — Encounter: Attending: Family Medicine | Primary: Family Medicine

## 2018-06-25 DIAGNOSIS — K5732 Diverticulitis of large intestine without perforation or abscess without bleeding: Secondary | ICD-10-CM

## 2018-06-25 DIAGNOSIS — R1032 Left lower quadrant pain: Secondary | ICD-10-CM

## 2018-06-25 MED ORDER — CIPROFLOXACIN HCL 500 MG PO TABS
500 MG | ORAL_TABLET | Freq: Two times a day (BID) | ORAL | 0 refills | Status: DC
Start: 2018-06-25 — End: 2018-11-05

## 2018-06-25 MED ORDER — METRONIDAZOLE 500 MG PO TABS
500 MG | ORAL_TABLET | Freq: Three times a day (TID) | ORAL | 0 refills | Status: AC
Start: 2018-06-25 — End: 2018-07-05

## 2018-06-25 NOTE — Progress Notes (Signed)
Katie  Juarez [id number]  Dept: [phone]  Dept Fax: [phone]  Loc: [phone]    Katie Juarez is a 78 y.o. female who presents today for her medical conditions/complaints as noted below.  Katie Juarez is c/o of   Chief Complaint   Patient presents with   ??? Constipation     x4 days. tried stool softener and miralax. sm BMs. hx of bowel obstruction.        HPI:     Here today for constipation.     Constipation   This is a new problem. The current episode started in the past 7 days (3 days). The problem has been gradually worsening since onset. Her stool frequency is 4 to 5 times per week. The stool is described as pellet like and pencil thin. The patient is on a high fiber diet. She exercises regularly. There has not been adequate water intake. Associated symptoms include abdominal pain, bloating and flatus. Pertinent negatives include no difficulty urinating, fecal incontinence, fever, hematochezia, hemorrhoids, melena, nausea, vomiting or weight loss. Risk factors: none. She has tried stool softeners (miralax) for the symptoms.     She had a bowel obstruction two years ago. She is using a capful of miralax a day for the past 3 days. She typically uses her stool softeners every day. She has a history of diverticulosis, but she has never had diverticulitis.      Past Medical History:   Diagnosis Date   ??? Breast cancer ([place]) 1999    in remission   ??? DDD (degenerative disc disease)    ??? Large bowel obstruction ([place]) 02/2015    [facility]   ??? LBP (low back pain)    ??? SS (spinal stenosis)           Social History     Tobacco Use   ??? Smoking status: Former Smoker     Packs/day: 1.00     Years: 40.00     Pack years: 40.00     Start date: 01/10/1953     Last attempt to quit: 01/11/1988     Years since quitting: 30.4   ??? Smokeless tobacco: Never Used   ??? Tobacco comment: quit 25 years ago 1990    Substance Use Topics   ??? Alcohol use: No     Alcohol/week: 0.0 standard drinks     Current Outpatient Medications   Medication Sig Dispense Refill   ??? ciprofloxacin (CIPRO) 500 MG tablet Take 1 tablet by mouth 2 times daily 20 tablet 0   ??? metroNIDAZOLE (FLAGYL) 500 MG tablet Take 1 tablet by mouth 3 times daily for 10 days 30 tablet 0   ??? citalopram (CELEXA) 20 MG tablet Take 1 tablet by mouth daily 90 tablet 1   ??? LORazepam (ATIVAN) 0.5 MG tablet TAKE 1 TABLET BY MOUTH NIGHTLY AS NEEDED FOR ANXIETY 90 tablet 0   ??? budesonide-formoterol (SYMBICORT) 160-4.5 MCG/ACT AERO Inhale 2 puffs into the lungs 2 times daily Rinse mouth or brush teeth after each use. 3 Inhaler 3   ??? atorvastatin (LIPITOR) 20 MG tablet Take 1 tablet by mouth daily 90 tablet 1   ??? Multiple Vitamins-Minerals (THRIVE FOR LIFE WOMENS) TABS Take by mouth     ??? albuterol sulfate HFA (VENTOLIN HFA) 108 (90 Base) MCG/ACT inhaler Ventolin HFA 90 mcg/actuation aerosol inhaler   Inhale 2  puffs every 6 hours by inhalation route as needed.   ASTHMA     ??? docusate sodium (COLACE) 100 MG capsule Take 100 mg by mouth 2 times daily     ??? aspirin 81 MG tablet Take 81 mg by mouth daily.     ??? polyvinyl alcohol-povidone (HYPOTEARS) 1.4-0.6 % ophthalmic solution Place 1-2 drops into both eyes as needed.     ??? fluticasone (FLONASE) 50 MCG/ACT nasal spray 1 spray by Nasal route daily 1 Bottle 0     No current facility-administered medications for this visit.           Allergies   Allergen Reactions   ??? Morphine Other (See Comments)     Night sweats,felt "loopy".   ??? Pcn [Penicillins] Hives   ??? Zithromax [Azithromycin] Hives       Subjective:     Review of Systems   Constitutional: Negative for activity change, appetite change, chills, fatigue, fever and weight loss.   Eyes: Negative for visual disturbance.   Respiratory: Negative for cough, chest tightness, shortness of breath and wheezing.    Cardiovascular: Negative for chest pain, palpitations and leg swelling.    Gastrointestinal: Positive for abdominal pain, bloating, constipation and flatus. Negative for blood in stool, hematochezia, hemorrhoids, melena, nausea and vomiting.   Genitourinary: Negative for difficulty urinating.   Neurological: Negative for dizziness, syncope, weakness, light-headedness and headaches.       Objective:      Physical Exam  Vitals signs and nursing note reviewed.   Constitutional:       General: She is not in acute distress.     Appearance: She is well-developed.   Eyes:      Conjunctiva/sclera: Conjunctivae normal.   Neck:      Musculoskeletal: Normal range of motion and neck supple.      Thyroid: No thyromegaly.   Cardiovascular:      Rate and Rhythm: Normal rate and regular rhythm.      Heart sounds: Normal heart sounds. No murmur.   Pulmonary:      Effort: Pulmonary effort is normal. No respiratory distress.      Breath sounds: Normal breath sounds. No wheezing.   Abdominal:      General: Abdomen is flat. Bowel sounds are decreased. There is distension.      Palpations: Abdomen is soft.      Tenderness: There is no abdominal tenderness. There is no guarding or rebound.   Lymphadenopathy:      Cervical: No cervical adenopathy.   Skin:     General: Skin is warm and dry.      Findings: No erythema or rash.   Neurological:      Mental Status: She is alert and oriented to person, place, and time.       BP 110/80 (Site: Right Upper Arm, Position: Sitting, Cuff Size: Medium Adult)    Pulse 72    Temp 97.9 ??F (36.6 ??C) (Tympanic)    Wt 148 lb (67.1 kg)    LMP  (LMP Unknown)    SpO2 96%    BMI 24.63 kg/m??     Assessment:       Diagnosis Orders   1. Diverticulitis of large intestine without perforation or abscess without bleeding     2. Left lower quadrant abdominal pain  CT ABDOMEN PELVIS WO CONTRAST Additional Contrast? None             Plan:        Diverticulitis:  new; I will treat with cipro and flagyl and I recommended that she continue her stool softeners.     Ct Abdomen Pelvis Wo Contrast  Additional Contrast? None    Result Date: 06/25/2018  1. Mild pericolonic inflammatory changes consistent with uncomplicated diverticulitis in the mid sigmoid colon. 2. No other acute findings within the abdomen or pelvis.  No evidence of obstructive uropathy. 3. Previous cholecystectomy, hysterectomy and small bowel resection.     Return if symptoms worsen or fail to improve.    Orders Placed This Encounter   Procedures   ??? CT ABDOMEN PELVIS WO CONTRAST Additional Contrast? None     Standing Status:   Future     Number of Occurrences:   1     Standing Expiration Date:   06/25/2019     Order Specific Question:   Additional Contrast?     Answer:   None     Order Specific Question:   Reason for exam:     Answer:   concern for diverticulitis     Orders Placed This Encounter   Medications   ??? ciprofloxacin (CIPRO) 500 MG tablet     Sig: Take 1 tablet by mouth 2 times daily     Dispense:  20 tablet     Refill:  0   ??? metroNIDAZOLE (FLAGYL) 500 MG tablet     Sig: Take 1 tablet by mouth 3 times daily for 10 days     Dispense:  30 tablet     Refill:  0       Patientgiven educational materials - see patient instructions.  Discussed use, benefit,and side effects of prescribed medications.  All patient questions answered. Ptvoiced understanding. Reviewed health maintenance.  Instructed to continue currentmedications, diet and exercise.  Patient agreed with treatment plan. Follow up asdirected.     Electronically signed by Nell Range, MD on 06/25/2018 at 4:44 PM

## 2018-06-28 ENCOUNTER — Ambulatory Visit: Admit: 2018-06-28 | Discharge: 2018-06-28 | Payer: MEDICARE | Attending: Family | Primary: Family Medicine

## 2018-06-28 ENCOUNTER — Inpatient Hospital Stay: Admit: 2018-06-28 | Payer: MEDICARE | Primary: Family Medicine

## 2018-06-28 ENCOUNTER — Inpatient Hospital Stay
Admit: 2018-06-28 | Discharge: 2018-06-30 | Disposition: A | Discharge status: Home / Self Care | Payer: MEDICARE | Source: Ambulatory Visit | Attending: Internal Medicine | Admitting: Internal Medicine

## 2018-06-28 DIAGNOSIS — K5732 Diverticulitis of large intestine without perforation or abscess without bleeding: Secondary | ICD-10-CM

## 2018-06-28 DIAGNOSIS — K5792 Diverticulitis of intestine, part unspecified, without perforation or abscess without bleeding: Secondary | ICD-10-CM

## 2018-06-28 LAB — BASIC METABOLIC PANEL
Anion Gap: 13 mmol/L (ref 9–17)
BUN: 18 mg/dL (ref 8–23)
Bun/Cre Ratio: 19 (ref 9–20)
CO2: 27 mmol/L (ref 20–31)
Calcium: 9.4 mg/dL (ref 8.6–10.4)
Chloride: 95 mmol/L — ABNORMAL LOW (ref 98–107)
Creatinine: 0.93 mg/dL — ABNORMAL HIGH (ref 0.50–0.90)
GFR African American: >60 mL/min (ref >60)
GFR Non-African American: 58 mL/min — ABNORMAL LOW (ref >60)
Glucose: 113 mg/dL — ABNORMAL HIGH (ref 70–99)
Potassium: 4.3 mmol/L (ref 3.7–5.3)
Sodium: 135 mmol/L (ref 135–144)

## 2018-06-28 LAB — CBC WITH AUTO DIFFERENTIAL
Absolute Eos #: 0 10*3/uL (ref 0.0–0.4)
Absolute Immature Granulocyte: 0 10*3/uL (ref 0.00–0.30)
Absolute Lymph #: 0.94 10*3/uL — ABNORMAL LOW (ref 1.0–4.8)
Absolute Mono #: 1.41 10*3/uL — ABNORMAL HIGH (ref 0.1–1.2)
Atypical Lymphocytes Absolute: 0.47 10*3/uL
Atypical Lymphocytes: 3 %
Basophils Absolute: 0 10*3/uL (ref 0.0–0.2)
Basophils: 0 % (ref 0–1)
Eosinophils %: 0 % — ABNORMAL LOW (ref 1–7)
Hematocrit: 45.3 % (ref 36.3–47.1)
Hemoglobin: 14.6 g/dL (ref 11.9–15.1)
Immature Granulocytes: 0 %
Lymphocytes: 6 % — ABNORMAL LOW (ref 16–46)
MCH: 30.4 pg (ref 25.2–33.5)
MCHC: 32.2 g/dL (ref 25.2–33.5)
MCV: 94.4 fL (ref 82.6–102.9)
MPV: 10 fL (ref 8.1–13.5)
Monocytes: 9 % (ref 4–11)
Morphology: ADEQUATE
Morphology: NORMAL
NRBC Automated: 0 per 100 WBC
Platelets: 218 10*3/uL (ref 138–453)
RBC: 4.8 m/uL (ref 3.95–5.11)
RDW: 13.2 % (ref 11.8–14.4)
Seg Neutrophils: 82 % — ABNORMAL HIGH (ref 43–77)
Segs Absolute: 12.88 10*3/uL — ABNORMAL HIGH (ref 1.8–7.7)
WBC: 15.7 10*3/uL — ABNORMAL HIGH (ref 3.5–11.3)

## 2018-06-28 LAB — URINALYSIS
Bilirubin Urine: NEGATIVE
Glucose, Ur: NEGATIVE
Ketones, Urine: NEGATIVE
Leukocyte Esterase, Urine: NEGATIVE
Nitrite, Urine: NEGATIVE
Protein, UA: NEGATIVE
Specific Gravity, UA: 1.01 (ref 1.010–1.025)
Urobilinogen, Urine: NORMAL
pH, UA: 5 (ref 5.0–6.0)

## 2018-06-28 LAB — MICROSCOPIC URINALYSIS
Epithelial Cells, UA: 0 /HPF (ref 0–5)
RBC, UA: 0 /HPF (ref 0–4)
WBC, UA: 0 /HPF (ref 0–4)

## 2018-06-28 MED ORDER — LACTATED RINGERS IV SOLN
INTRAVENOUS | Status: DC
Start: 2018-06-28 — End: 2018-06-30
  Administered 2018-06-28 – 2018-06-30 (×4): via INTRAVENOUS

## 2018-06-28 MED ORDER — ACETAMINOPHEN 325 MG PO TABS
325 MG | ORAL | Status: DC | PRN
Start: 2018-06-28 — End: 2018-06-30

## 2018-06-28 MED ORDER — CITALOPRAM HYDROBROMIDE 20 MG PO TABS
20 MG | Freq: Every evening | ORAL | Status: DC
Start: 2018-06-28 — End: 2018-06-30
  Administered 2018-06-29 – 2018-06-30 (×2): 20 mg via ORAL

## 2018-06-28 MED ORDER — ACETAMINOPHEN 650 MG RE SUPP
650 MG | RECTAL | Status: DC | PRN
Start: 2018-06-28 — End: 2018-06-30

## 2018-06-28 MED ORDER — ACETAMINOPHEN 325 MG PO TABS
325 MG | Freq: Four times a day (QID) | ORAL | Status: DC | PRN
Start: 2018-06-28 — End: 2018-06-28

## 2018-06-28 MED ORDER — BUDESONIDE-FORMOTEROL FUMARATE 160-4.5 MCG/ACT IN AERO
Freq: Two times a day (BID) | RESPIRATORY_TRACT | Status: DC
Start: 2018-06-28 — End: 2018-06-30
  Administered 2018-06-28 – 2018-06-30 (×4): 2 via RESPIRATORY_TRACT

## 2018-06-28 MED ORDER — LORAZEPAM 0.5 MG PO TABS
0.5 MG | Freq: Every evening | ORAL | Status: DC | PRN
Start: 2018-06-28 — End: 2018-06-30
  Administered 2018-06-29 – 2018-06-30 (×2): 0.5 mg via ORAL

## 2018-06-28 MED ORDER — CIPROFLOXACIN IN D5W 400 MG/200ML IV SOLN
400 | Freq: Two times a day (BID) | INTRAVENOUS | Status: AC
Start: 2018-06-28 — End: 2018-06-29
  Administered 2018-06-28 – 2018-06-29 (×3): 400 mg via INTRAVENOUS

## 2018-06-28 MED ORDER — HYDROMORPHONE HCL 1 MG/ML IJ SOLN
1 MG/ML | INTRAMUSCULAR | Status: DC | PRN
Start: 2018-06-28 — End: 2018-06-30

## 2018-06-28 MED ORDER — IOPAMIDOL 76 % IV SOLN
76 % | Freq: Once | INTRAVENOUS | Status: AC | PRN
Start: 2018-06-28 — End: 2018-06-28
  Administered 2018-06-28: 20:00:00 100 mL via INTRAVENOUS

## 2018-06-28 MED ORDER — SODIUM CHLORIDE (PF) 0.9 % IJ SOLN
0.9 % | Freq: Every day | INTRAMUSCULAR | Status: DC
Start: 2018-06-28 — End: 2018-06-29
  Administered 2018-06-28 – 2018-06-29 (×2): 10 mL via INTRAVENOUS

## 2018-06-28 MED ORDER — CITALOPRAM HYDROBROMIDE 20 MG PO TABS
20 MG | Freq: Every day | ORAL | Status: DC
Start: 2018-06-28 — End: 2018-06-28

## 2018-06-28 MED ORDER — POLYETHYLENE GLYCOL 3350 17 G PO PACK
17 g | Freq: Every day | ORAL | Status: DC | PRN
Start: 2018-06-28 — End: 2018-06-30

## 2018-06-28 MED ORDER — NORMAL SALINE FLUSH 0.9 % IV SOLN
0.9 | INTRAVENOUS | Status: DC | PRN
Start: 2018-06-28 — End: 2018-06-30

## 2018-06-28 MED ORDER — CIPROFLOXACIN IN D5W 400 MG/200ML IV SOLN
400 MG/200ML | Freq: Two times a day (BID) | INTRAVENOUS | Status: DC
Start: 2018-06-28 — End: 2018-06-28

## 2018-06-28 MED ORDER — NORMAL SALINE FLUSH 0.9 % IV SOLN
0.9 | Freq: Two times a day (BID) | INTRAVENOUS | Status: DC
Start: 2018-06-28 — End: 2018-06-30

## 2018-06-28 MED ORDER — ONDANSETRON HCL 4 MG/2ML IJ SOLN
4 MG/2ML | Freq: Four times a day (QID) | INTRAMUSCULAR | Status: DC | PRN
Start: 2018-06-28 — End: 2018-06-30

## 2018-06-28 MED ORDER — ACETAMINOPHEN 650 MG RE SUPP
650 MG | Freq: Four times a day (QID) | RECTAL | Status: DC | PRN
Start: 2018-06-28 — End: 2018-06-28

## 2018-06-28 MED ORDER — METRONIDAZOLE IN NACL 5-0.79 MG/ML-% IV SOLN
5 | Freq: Three times a day (TID) | INTRAVENOUS | Status: AC
Start: 2018-06-28 — End: 2018-06-30
  Administered 2018-06-28 – 2018-06-30 (×5): 500 mg via INTRAVENOUS

## 2018-06-28 MED ORDER — PANTOPRAZOLE SODIUM 40 MG IV SOLR
40 MG | Freq: Every day | INTRAVENOUS | Status: DC
Start: 2018-06-28 — End: 2018-06-29
  Administered 2018-06-28 – 2018-06-29 (×2): 40 mg via INTRAVENOUS

## 2018-06-28 MED FILL — SYMBICORT 160-4.5 MCG/ACT IN AERO: RESPIRATORY_TRACT | Qty: 6

## 2018-06-28 MED FILL — METRONIDAZOLE IN NACL 5-0.79 MG/ML-% IV SOLN: 5 mg/mL | INTRAVENOUS | Qty: 100

## 2018-06-28 MED FILL — PROTONIX 40 MG IV SOLR: 40 MG | INTRAVENOUS | Qty: 40

## 2018-06-28 MED FILL — CIPROFLOXACIN IN D5W 400 MG/200ML IV SOLN: 400 MG/200ML | INTRAVENOUS | Qty: 200

## 2018-06-28 NOTE — Plan of Care (Signed)
Problem: Bowel/Gastric:  Goal: Control of bowel function will improve  Description: Control of bowel function will improve  Outcome: Ongoing  Goal: Ability to achieve a regular elimination pattern will improve  Description: Ability to achieve a regular elimination pattern will improve  Outcome: Ongoing     Problem: Nutritional:  Goal: Ability to follow a diet with enough fiber (20 to 30 grams) for normal bowel function will improve  Description: Ability to follow a diet with enough fiber (20 to 30 grams) for normal bowel function will improve  Outcome: Ongoing     Problem: Infection:  Goal: Will remain free from infection  Description: Will remain free from infection  Outcome: Ongoing     Problem: Safety:  Goal: Free from accidental physical injury  Description: Free from accidental physical injury  Outcome: Ongoing  Goal: Free from intentional harm  Description: Free from intentional harm  Outcome: Ongoing     Problem: Daily Care:  Goal: Daily care needs are met  Description: Daily care needs are met  Outcome: Ongoing     Problem: Pain:  Goal: Patient's pain/discomfort is manageable  Description: Patient's pain/discomfort is manageable  Outcome: Ongoing     Problem: Discharge Planning:  Goal: Patients continuum of care needs are met  Description: Patients continuum of care needs are met  Outcome: Ongoing

## 2018-06-28 NOTE — H&P (Signed)
HOSPITALIST ADMISSION H&P      REASON FOR ADMISSION:    Sigmoid diverticulitis--acute  ESTIMATED LENGTH OF STAY:  > 2 midnights---2-4 days    ATTENDING/ADMITTING PHYSICIAN: Janene Harvey MD  PCP: [name], DO    HISTORY OF PRESENT ILLNESS:      The patient is a 78 y.o. female patient of [name], DO who presents with onset of LLQ pain suprapubic pain---had an initial evaluation--6.15.2020 and was on Cipro-Flagyl--see CT AP below----did nit improve and was seen in UC today----6.18.2020----this time the CT AP--demonstrates the presence of sigmoid diverticulitis--now with a concern for a developing abscess--leukocytosis---cramping---pain increased--headache    Prior history of large bowel obstruction with resection    COPD--emphysema--asthma---former smoker      See below for additional PMH.    Patient seen-questioned-examined-available records reviewed, including, but not limited to, UC reports---labs--imaging---EKG---office records--personal notes         Past Medical History:   Diagnosis Date   ??? Breast cancer ([place]) 1999    in remission   ??? DDD (degenerative disc disease)    ??? Large bowel obstruction ([place]) 02/2015    [facility]   ??? LBP (low back pain)    ??? SS (spinal stenosis)            Past Surgical History:   Procedure Laterality Date   ??? APPENDECTOMY  1956   ??? BLADDER SUSPENSION  1990   ??? BREAST LUMPECTOMY Right 1999   ??? CHOLECYSTECTOMY  1990   ??? HYSTERECTOMY  1980    Done for endometriosis; done in [place]   ??? OTHER SURGICAL HISTORY  11/14/13, 06/23/11, 06/30/11, 07/12/11 12/29/11    L4/L5 IESI   ??? OTHER SURGICAL HISTORY  12/17/13    L5/S1 IESI   ??? OTHER SURGICAL HISTORY Bilateral 05/30/14    SIJ and piriformis   ??? OTHER SURGICAL HISTORY Bilateral 09/16/2014    L3, L4, L5 Diagnostic Medial Branch Block   ??? OTHER SURGICAL HISTORY Bilateral 09/26/2014    L5 TFE   ??? OTHER SURGICAL HISTORY  10/10/2014    bil L5 TFE   ??? OTHER SURGICAL HISTORY  10/31/2014    caudal   ??? TONSILLECTOMY  1946       Medications  Prior to Admission:    Medications Prior to Admission: ciprofloxacin (CIPRO) 500 MG tablet, Take 1 tablet by mouth 2 times daily  metroNIDAZOLE (FLAGYL) 500 MG tablet, Take 1 tablet by mouth 3 times daily for 10 days  citalopram (CELEXA) 20 MG tablet, Take 1 tablet by mouth daily  LORazepam (ATIVAN) 0.5 MG tablet, TAKE 1 TABLET BY MOUTH NIGHTLY AS NEEDED FOR ANXIETY  budesonide-formoterol (SYMBICORT) 160-4.5 MCG/ACT AERO, Inhale 2 puffs into the lungs 2 times daily Rinse mouth or brush teeth after each use.  atorvastatin (LIPITOR) 20 MG tablet, Take 1 tablet by mouth daily  Multiple Vitamins-Minerals (THRIVE FOR LIFE WOMENS) TABS, Take by mouth  docusate sodium (COLACE) 100 MG capsule, Take 100 mg by mouth 2 times daily  polyvinyl alcohol-povidone (HYPOTEARS) 1.4-0.6 % ophthalmic solution, Place 1-2 drops into both eyes as needed.  fluticasone (FLONASE) 50 MCG/ACT nasal spray, 1 spray by Nasal route daily  albuterol sulfate HFA (VENTOLIN HFA) 108 (90 Base) MCG/ACT inhaler, Ventolin HFA 90 mcg/actuation aerosol inhaler  Inhale 2 puffs every 6 hours by inhalation route as needed.  ASTHMA  aspirin 81 MG tablet, Take 81 mg by mouth daily.    Allergies:    Morphine; Pcn [penicillins]; and  Zithromax [azithromycin]    Social History:    reports that she quit smoking about 30 years ago. She started smoking about 65 years ago. She has a 40.00 pack-year smoking history. She has never used smokeless tobacco. She reports that she does not drink alcohol or use drugs.    Family History:   family history includes Alzheimer's Disease in her sister; Cancer in her brother and sister; Dementia in her sister; Diabetes in her maternal grandfather; Lupus in her sister; Other in her brother, brother, and mother; Stroke in her father.  Mother ALS, daughter--decreased--47--MS    REVIEW OF SYSTEMS:  See HPI and problem list; otherwise no other new complaints with respect to HEENT, neck, pulmonary, coronary, GI, GU, endocrine,  musculoskeletal, immune system/connective tissue disease, hematologic, neuropsych, skin, lymphatics, or malignancies.     PHYSICAL EXAM:  Vitals:  Pulse 74    Temp 98.8 ??F (37.1 ??C) (Temporal)    Resp 18    Ht 5\' 5"  (1.651 m)    Wt 147 lb 11.2 oz (67 kg)    LMP  (LMP Unknown)    SpO2 96%    BMI 24.58 kg/m??     HEENT: Normocephalic and Atraumatic  Neck: Supple, No Masses, Tenderness, Nodularity and No Lymphadenopathy  Chest/Lungs: Clear to Auscultation without Rales, Rhonchi, or Wheezes and Distant Breath Sounds  Cardiac: Regular Rate and Rhythm without Rubs, Clicks, Gallops, or Murmurs  GI/Abdomen: Bowel Sounds Present but diminished and Soft, tenderness LLQ and suprapubic region, without Guarding or Rebound Tenderness  GU: Not examined  EXT/Skin: No Edema, No Cyanosis and No Clubbing  Neuro: Alert and Oriented and No Localizing Signs/Symptoms        LABS:    CBC with Differential:    Lab Results   Component Value Date    WBC 15.7 06/28/2018    RBC 4.80 06/28/2018    HGB 14.6 06/28/2018    HCT 45.3 06/28/2018    PLT 218 06/28/2018    MCV 94.4 06/28/2018    MCH 30.4 06/28/2018    MCHC 32.2 06/28/2018    RDW 13.2 06/28/2018    LYMPHOPCT 6 06/28/2018    MONOPCT 9 06/28/2018    BASOPCT 0 06/28/2018    MONOSABS 1.41 06/28/2018    LYMPHSABS 0.94 06/28/2018    EOSABS 0.00 06/28/2018    BASOSABS 0.00 06/28/2018    DIFFTYPE NOT REPORTED 06/28/2018     BMP:    Lab Results   Component Value Date    NA 135 06/28/2018    K 4.3 06/28/2018    CL 95 06/28/2018    CO2 27 06/28/2018    BUN 18 06/28/2018    LABALBU 4.0 09/27/2017    CREATININE 0.93 06/28/2018    CALCIUM 9.4 06/28/2018    GFRAA >60 06/28/2018    LABGLOM 58 06/28/2018    GLUCOSE 113 06/28/2018    GLUCOSE 94 09/27/2017       ASSESSMENT:      Patient Active Problem List   Diagnosis   ??? Lumbar facet joint syndrome ([place])   ??? 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 Pain of both sacroiliac joints   ???  Piriformis syndrome of left side   ??? Lumbar radicular pain   ??? Neural foraminal stenosis of lumbar spine   ??? Chronic low back pain   ??? Spinal stenosis, lumbar   ??? Diverticulitis   [place], [place]  V  41 WF  [kk Black, FP;  Russ Halo, GS]  FULL CODE     SCDs    Anti-infectives:  Cipro IV, Flagyl IV    Diverticulitis--sigmoid---possible abscess formation---6.18.2020----leukocytosis         CT abdomen--pelvis---6.18.2020---sigmoid diverticulitis--mild progression--                             increased colonic wall thickening and pericolonic inflammatory                             changes---questionable developing intramural abscess--no evidence                              of perforation--no other acute findings         CT abdomen-pelvis---6.15.2020---mild pericolonic inflammatory changes consistent                              with uncomplicated diverticulitis mid sigmoid colon--no other acute findings--                              no evidence of obstructive uropathy--previous cholecystectomy--                              hysterectomy--small bowel resection  ASCVD         Cardiac catheterization----angioplasty--2009  CKD---Stage 3  Arrhythmia   COPD---emphysema   Asthma   GERD  Chronic pain syndrome        DDD        Lumbar facet syndrome        LS disc displacement        Spondylolisthesis multiple sites spine        SIJ pain---bilaterally        Left piriformis syndrome        Lumbar radicular pain        Chronic low back pain        Lumber spinal stenosis  Depression  Headache   Tobacco abuse----1 PPD x 35 years---quit--c. 1990  HEARING IMPAIRMENT  PMH:   anemia, bronchitis, OA, breast carcinoma---1999--remission, cataracts, large               bowel obstruction---2017, hematuria, recurrent UTIs  PSH:    hysterectomy--cervix unknown--1980---endometriosis, bladder suspension--AP repair---1990,               cholecystectomy---1990, right breast lumpectomy--1999, tonsillectomy---1946, bacteremia,               appendectomy---1956, multiple bilateral pain management injection--variously L5-S1--              SIJ--piriformis--L3-4-5--caudal, cystoscopy--2008--laterally displace right UO--              likely right reflux, cataract extraction--IOL--OU, hernia repair with bowel obstruction    Allergies:  penicillin---hives, Zithromax--azithromycin---hives                          Intolerances:  morphine--night sweats--???loopy???    Code Status---FULL CODE  OARRS----6.18.2020---[-]    PLAN:    1.  Diverticulitis--due to PCN allergy---Cipro and Flagyl, both IV---IVF--pain and nausea control----General Surgery  2.  Home medications reviewed  3.  See orders  Note that over 50 minutes was spent in evaluation of the patient, review of the chart and pertinent records, discussion with family/staff, etc    Janene Harvey  MD  6:00 PM  06/28/2018

## 2018-06-28 NOTE — Plan of Care (Signed)
Problem: Bowel/Gastric:  Goal: Control of bowel function will improve  Description: Control of bowel function will improve  06/28/2018 2234 by Leatrice Jewels, RN  Outcome: Ongoing  06/28/2018 1733 by Aldona Lento, RN  Outcome: Ongoing  Goal: Ability to achieve a regular elimination pattern will improve  Description: Ability to achieve a regular elimination pattern will improve  06/28/2018 2234 by Leatrice Jewels, RN  Outcome: Ongoing  06/28/2018 1733 by Aldona Lento, RN  Outcome: Ongoing     Problem: Nutritional:  Goal: Ability to follow a diet with enough fiber (20 to 30 grams) for normal bowel function will improve  Description: Ability to follow a diet with enough fiber (20 to 30 grams) for normal bowel function will improve  06/28/2018 2234 by Leatrice Jewels, RN  Outcome: Ongoing  06/28/2018 1733 by Aldona Lento, RN  Outcome: Ongoing     Problem: Infection:  Goal: Will remain free from infection  Description: Will remain free from infection  06/28/2018 2234 by Leatrice Jewels, RN  Outcome: Ongoing  06/28/2018 1733 by Aldona Lento, RN  Outcome: Ongoing     Problem: Safety:  Goal: Free from accidental physical injury  Description: Free from accidental physical injury  06/28/2018 2234 by Leatrice Jewels, RN  Outcome: Ongoing  06/28/2018 1733 by Aldona Lento, RN  Outcome: Ongoing  Goal: Free from intentional harm  Description: Free from intentional harm  06/28/2018 2234 by Leatrice Jewels, RN  Outcome: Ongoing  06/28/2018 1733 by Aldona Lento, RN  Outcome: Ongoing     Problem: Daily Care:  Goal: Daily care needs are met  Description: Daily care needs are met  06/28/2018 2234 by Leatrice Jewels, RN  Outcome: Ongoing  06/28/2018 1733 by Aldona Lento, RN  Outcome: Ongoing     Problem: Pain:  Goal: Patient's pain/discomfort is manageable  Description: Patient's pain/discomfort is manageable  06/28/2018 2234 by Leatrice Jewels, RN  Outcome: Ongoing  06/28/2018 1733 by Aldona Lento, RN  Outcome: Ongoing     Problem: Discharge  Planning:  Goal: Patients continuum of care needs are met  Description: Patients continuum of care needs are met  06/28/2018 2234 by Leatrice Jewels, RN  Outcome: Ongoing  06/28/2018 1733 by Aldona Lento, RN  Outcome: Ongoing

## 2018-06-28 NOTE — Progress Notes (Signed)
06/28/2018     Katie Juarez (DOB:  09-18-1940) is a 78 y.o. female, here for evaluation of the following medical concerns:    Abdominal Pain   This is a new problem. The current episode started in the past 7 days. The onset quality is undetermined. The problem occurs daily. The problem has been unchanged. The pain is located in the LLQ and RLQ. The pain is at a severity of 5/10. The pain is moderate. The quality of the pain is cramping. The abdominal pain does not radiate. Associated symptoms include diarrhea and a fever. Pertinent negatives include no constipation, flatus, frequency, nausea or vomiting. The pain is aggravated by eating. The pain is relieved by nothing. She has tried antibiotics for the symptoms. The treatment provided no relief. Prior diagnostic workup includes CT scan. diagnosis of diverticulitis on Monday June 15       Past Medical History:   Diagnosis Date   ??? Breast cancer ([place]) 1999    in remission   ??? DDD (degenerative disc disease)    ??? Large bowel obstruction ([place]) 02/2015    [facility]   ??? LBP (low back pain)    ??? SS (spinal stenosis)        Past Surgical History:   Procedure Laterality Date   ??? APPENDECTOMY  1956   ??? BLADDER SUSPENSION  1990   ??? BREAST LUMPECTOMY Right 1999   ??? CHOLECYSTECTOMY  1990   ??? HYSTERECTOMY  1980    Done for endometriosis; done in [place]   ??? OTHER SURGICAL HISTORY  11/14/13, 06/23/11, 06/30/11, 07/12/11 12/29/11    L4/L5 IESI   ??? OTHER SURGICAL HISTORY  12/17/13    L5/S1 IESI   ??? OTHER SURGICAL HISTORY Bilateral 05/30/14    SIJ and piriformis   ??? OTHER SURGICAL HISTORY Bilateral 09/16/2014    L3, L4, L5 Diagnostic Medial Branch Block   ??? OTHER SURGICAL HISTORY Bilateral 09/26/2014    L5 TFE   ??? OTHER SURGICAL HISTORY  10/10/2014    bil L5 TFE   ??? OTHER SURGICAL HISTORY  10/31/2014    caudal   ??? TONSILLECTOMY  1946       Social History     Socioeconomic History   ??? Marital status: Married     Spouse name: None   ??? Number of children: None   ??? Years of  education: None   ??? Highest education level: None   Occupational History   ??? None   Social Needs   ??? Financial resource strain: None   ??? Food insecurity     Worry: None     Inability: None   ??? Transportation needs     Medical: None     Non-medical: None   Tobacco Use   ??? Smoking status: Former Smoker     Packs/day: 1.00     Years: 40.00     Pack years: 40.00     Start date: 01/10/1953     Last attempt to quit: 01/11/1988     Years since quitting: 30.4   ??? Smokeless tobacco: Never Used   ??? Tobacco comment: quit 25 years ago 1990   Substance and Sexual Activity   ??? Alcohol use: No     Alcohol/week: 0.0 standard drinks   ??? Drug use: Never   ??? Sexual activity: None   Lifestyle   ??? Physical activity     Days per week: None     Minutes per session: None   ???  Stress: None   Relationships   ??? Social Product manager on phone: None     Gets together: None     Attends religious service: None     Active member of club or organization: None     Attends meetings of clubs or organizations: None     Relationship status: None   ??? Intimate partner violence     Fear of current or ex partner: None     Emotionally abused: None     Physically abused: None     Forced sexual activity: None   Other Topics Concern   ??? None   Social History Narrative   ??? None       Family History   Problem Relation Age of Onset   ??? Other Mother         ALS - diagnosed at age 75   ??? Stroke Father         age 54   ??? Cancer Sister         pt thinks it was liver   ??? Cancer Brother         leukemia   ??? Diabetes Maternal Grandfather    ??? Other Brother         drowned   ??? Other Brother         died at age 79; think due to spinal meningitis   ??? Lupus Sister    ??? Alzheimer's Disease Sister    ??? Dementia Sister        Allergies   Allergen Reactions   ??? Morphine Other (See Comments)     Night sweats,felt "loopy".   ??? Pcn [Penicillins] Hives   ??? Zithromax [Azithromycin] Hives       Current Outpatient Medications   Medication Sig Dispense Refill   ??? ciprofloxacin (CIPRO)  500 MG tablet Take 1 tablet by mouth 2 times daily 20 tablet 0   ??? metroNIDAZOLE (FLAGYL) 500 MG tablet Take 1 tablet by mouth 3 times daily for 10 days 30 tablet 0   ??? citalopram (CELEXA) 20 MG tablet Take 1 tablet by mouth daily 90 tablet 1   ??? LORazepam (ATIVAN) 0.5 MG tablet TAKE 1 TABLET BY MOUTH NIGHTLY AS NEEDED FOR ANXIETY 90 tablet 0   ??? budesonide-formoterol (SYMBICORT) 160-4.5 MCG/ACT AERO Inhale 2 puffs into the lungs 2 times daily Rinse mouth or brush teeth after each use. 3 Inhaler 3   ??? atorvastatin (LIPITOR) 20 MG tablet Take 1 tablet by mouth daily 90 tablet 1   ??? Multiple Vitamins-Minerals (THRIVE FOR LIFE WOMENS) TABS Take by mouth     ??? albuterol sulfate HFA (VENTOLIN HFA) 108 (90 Base) MCG/ACT inhaler Ventolin HFA 90 mcg/actuation aerosol inhaler   Inhale 2 puffs every 6 hours by inhalation route as needed.   ASTHMA     ??? docusate sodium (COLACE) 100 MG capsule Take 100 mg by mouth 2 times daily     ??? aspirin 81 MG tablet Take 81 mg by mouth daily.     ??? polyvinyl alcohol-povidone (HYPOTEARS) 1.4-0.6 % ophthalmic solution Place 1-2 drops into both eyes as needed.     ??? fluticasone (FLONASE) 50 MCG/ACT nasal spray 1 spray by Nasal route daily 1 Bottle 0     No current facility-administered medications for this visit.        Review of Systems   Constitutional: Positive for appetite change and fever. Negative for activity change.   Respiratory:  Negative for cough, shortness of breath and wheezing.    Cardiovascular: Negative for chest pain and palpitations.   Gastrointestinal: Positive for abdominal pain and diarrhea. Negative for constipation, flatus, nausea and vomiting.   Genitourinary: Negative for frequency.       Prior to Visit Medications    Medication Sig Taking? Authorizing Provider   ciprofloxacin (CIPRO) 500 MG tablet Take 1 tablet by mouth 2 times daily Yes Nell Range, MD   metroNIDAZOLE (FLAGYL) 500 MG tablet Take 1 tablet by mouth 3 times daily for 10 days Yes Nell Range, MD    citalopram (CELEXA) 20 MG tablet Take 1 tablet by mouth daily Yes [name], DO   LORazepam (ATIVAN) 0.5 MG tablet TAKE 1 TABLET BY MOUTH NIGHTLY AS NEEDED FOR ANXIETY Yes [name], DO   budesonide-formoterol (SYMBICORT) 160-4.5 MCG/ACT AERO Inhale 2 puffs into the lungs 2 times daily Rinse mouth or brush teeth after each use. Yes [name], DO   atorvastatin (LIPITOR) 20 MG tablet Take 1 tablet by mouth daily Yes [name], DO   Multiple Vitamins-Minerals (THRIVE FOR LIFE WOMENS) TABS Take by mouth Yes Historical Provider, MD   albuterol sulfate HFA (VENTOLIN HFA) 108 (90 Base) MCG/ACT inhaler Ventolin HFA 90 mcg/actuation aerosol inhaler   Inhale 2 puffs every 6 hours by inhalation route as needed.   ASTHMA Yes Historical Provider, MD   docusate sodium (COLACE) 100 MG capsule Take 100 mg by mouth 2 times daily Yes Historical Provider, MD   aspirin 81 MG tablet Take 81 mg by mouth daily. Yes Historical Provider, MD   polyvinyl alcohol-povidone (HYPOTEARS) 1.4-0.6 % ophthalmic solution Place 1-2 drops into both eyes as needed. Yes Historical Provider, MD   fluticasone (FLONASE) 50 MCG/ACT nasal spray 1 spray by Nasal route daily  Leona Carry Hermiller, APRN - CNP        Social History     Tobacco Use   ??? Smoking status: Former Smoker     Packs/day: 1.00     Years: 40.00     Pack years: 40.00     Start date: 01/10/1953     Last attempt to quit: 01/11/1988     Years since quitting: 30.4   ??? Smokeless tobacco: Never Used   ??? Tobacco comment: quit 25 years ago 1990   Substance Use Topics   ??? Alcohol use: No     Alcohol/week: 0.0 standard drinks        Vitals:    06/28/18 1320   BP: 138/84   Pulse: 72   Resp: 16   Temp: 99.5 ??F (37.5 ??C)   SpO2: 98%   Weight: 148 lb (67.1 kg)   Height: '5\' 5"'  (1.651 m)     Estimated body mass index is 24.63 kg/m?? as calculated from the following:    Height as of this encounter: '5\' 5"'  (1.651 m).    Weight as of this encounter: 148 lb (67.1 kg).    Physical Exam  Vitals signs  and nursing note reviewed.   Constitutional:       Appearance: Normal appearance.   HENT:      Head: Normocephalic and atraumatic.   Cardiovascular:      Rate and Rhythm: Normal rate and regular rhythm.      Heart sounds: Normal heart sounds.   Pulmonary:      Effort: Pulmonary effort is normal.      Breath sounds: Normal breath sounds.  Abdominal:      General: Bowel sounds are normal.      Palpations: Abdomen is soft.      Tenderness: There is abdominal tenderness in the right lower quadrant and left lower quadrant.   Skin:     Capillary Refill: Capillary refill takes less than 2 seconds.   Neurological:      General: No focal deficit present.      Mental Status: She is alert and oriented to person, place, and time.       Hospital Outpatient Visit on 06/28/2018   Component Date Value Ref Range Status   ??? WBC 06/28/2018 15.7* 3.5 - 11.3 k/uL Final   ??? RBC 06/28/2018 4.80  3.95 - 5.11 m/uL Final   ??? Hemoglobin 06/28/2018 14.6  11.9 - 15.1 g/dL Final   ??? Hematocrit 06/28/2018 45.3  36.3 - 47.1 % Final   ??? MCV 06/28/2018 94.4  82.6 - 102.9 fL Final   ??? MCH 06/28/2018 30.4  25.2 - 33.5 pg Final   ??? MCHC 06/28/2018 32.2  25.2 - 33.5 g/dL Final   ??? RDW 06/28/2018 13.2  11.8 - 14.4 % Final   ??? Platelets 06/28/2018 218  138 - 453 k/uL Final   ??? MPV 06/28/2018 10.0  8.1 - 13.5 fL Final   ??? NRBC Automated 06/28/2018 0.0  0.0 per 100 WBC Final   ??? Differential Type 06/28/2018 NOT REPORTED   Final   ??? WBC Morphology 06/28/2018 NOT REPORTED   Final   ??? RBC Morphology 06/28/2018 NOT REPORTED   Final   ??? Platelet Estimate 06/28/2018 NOT REPORTED   Final   ??? Monocytes 06/28/2018 9  4 - 11 % Final   ??? Lymphocytes 06/28/2018 6* 16 - 46 % Final   ??? Seg Neutrophils 06/28/2018 82* 43 - 77 % Final   ??? Eosinophils % 06/28/2018 0* 1 - 7 % Final   ??? Basophils 06/28/2018 0  0 - 1 % Final   ??? Immature Granulocytes 06/28/2018 0  0 % Final   ??? Atypical Lymphocytes 06/28/2018 3  % Final   ??? Absolute Mono # 06/28/2018 1.41* 0.1 - 1.2 k/uL Final    ??? Absolute Lymph # 06/28/2018 0.94* 1.0 - 4.8 k/uL Final   ??? Segs Absolute 06/28/2018 12.88* 1.8 - 7.7 k/uL Final   ??? Absolute Eos # 06/28/2018 0.00  0.0 - 0.4 k/uL Final   ??? Basophils Absolute 06/28/2018 0.00  0.0 - 0.2 k/uL Final   ??? Absolute Immature Granulocyte 06/28/2018 0.00  0.00 - 0.30 k/uL Final   ??? Atypical Lymphocytes Absolute 06/28/2018 0.47  k/uL Final   ??? Morphology 06/28/2018 Platelet count adequate   Final   ??? Morphology 06/28/2018 RBC morphology normal.   Final   ??? Glucose 06/28/2018 113* 70 - 99 mg/dL Final   ??? BUN 06/28/2018 18  8 - 23 mg/dL Final   ??? CREATININE 06/28/2018 0.93* 0.50 - 0.90 mg/dL Final   ??? Bun/Cre Ratio 06/28/2018 19  9 - 20 Final   ??? Calcium 06/28/2018 9.4  8.6 - 10.4 mg/dL Final   ??? Sodium 06/28/2018 135  135 - 144 mmol/L Final   ??? Potassium 06/28/2018 4.3  3.7 - 5.3 mmol/L Final   ??? Chloride 06/28/2018 95* 98 - 107 mmol/L Final   ??? CO2 06/28/2018 27  20 - 31 mmol/L Final   ??? Anion Gap 06/28/2018 13  9 - 17 mmol/L Final   ??? GFR Non-African American 06/28/2018 58* >60  mL/min Final   ??? GFR African American 06/28/2018 >60  >60 mL/min Final   ??? GFR Comment 06/28/2018        Final    Comment: Average GFR for 59 or more years old:   50 mL/min/1.73sq m  Chronic Kidney Disease:   <60 mL/min/1.73sq m  Kidney failure:   <15 mL/min/1.73sq m              eGFR calculated using average adult body mass. Additional eGFR calculator available at:        [email or web address]           ??? GFR Staging 06/28/2018 NOT REPORTED   Final     CT ABDOMEN PELVIS W IV CONTRAST Additional Contrast? None  Narrative: EXAMINATION:  CT OF THE ABDOMEN AND PELVIS WITH CONTRAST 06/28/2018 3:31 pm    TECHNIQUE:  CT of the abdomen and pelvis was performed with the administration of  intravenous contrast. Multiplanar reformatted images are provided for review.  Dose modulation, iterative reconstruction, and/or weight based adjustment of  the mA/kV was utilized to reduce the radiation dose to  as low as reasonably  achievable.    COMPARISON:  06/25/2018    HISTORY:  ORDERING SYSTEM PROVIDED HISTORY: Diverticulitis of large intestine without  perforation or abscess without bleeding  TECHNOLOGIST PROVIDED HISTORY:    diverticulitis on CT Monday 6/15 no improvement of symptoms, fever 99.5 WBC  15.7  Reason for Exam: lower abd pain, hx diverticulitis, r/o abscess  Acuity: Acute  Type of Exam: Initial    FINDINGS:  Lower Chest: No acute infiltrate at the lung bases.  Elevated left  hemidiaphragm with mild dependent atelectasis.  Atherosclerotic calcification  in the coronary circulation.    Organs: The gallbladder is surgically absent with no biliary dilatation.  The  liver, spleen, pancreas and adrenal glands are unremarkable.  No renal mass  or significant hydronephrosis.  Lower pole left renal cyst measuring 1.9 cm.    GI/Bowel: There is progression of colonic wall thickening with pericolonic  inflammatory changes in the sigmoid colon consistent with diverticulitis.  There is a questionable developing intramural abscess measuring 1.4 cm in  diameter.  There is no evidence of perforation or significant free fluid.  No  other pericolonic inflammatory changes.  No small bowel distension.  Previous  small bowel resection with no unexpected findings.  The stomach and duodenal  sweep are intact.    Pelvis: No pelvic mass or free pelvic fluid.  The uterus is absent.  Partial  distention of the urinary bladder.    Peritoneum/Retroperitoneum: The abdominal aorta is normal in caliber with  moderate aortoiliac atherosclerotic disease.  No retroperitoneal adenopathy.  No upper abdominal ascites.    Bones/Soft Tissues: No acute osseous or soft tissue abnormality.  Small fat  containing ventral abdominal wall hernia.  Posterior lumbar fusion at L4-5.  Impression: 1. Sigmoid diverticulitis with mild progression.  Increased colonic wall  thickening and pericolonic inflammatory changes with questionable  developing  intramural abscess.  No evidence of perforation.  2. No other acute findings within the abdomen or pelvis.      ASSESSMENT/PLAN:  1. Diverticulitis  Reviewed case with Dr. Blanca Friend and Dr. Evelene Croon  Pt is to be admitted for possible abscess   - CBC With Auto Differential; Future  - Basic Metabolic Panel; Future  - CT ABDOMEN PELVIS W IV CONTRAST Additional Contrast? None; Future      No follow-ups on file.    An electronic signature  was used to authenticate this note.    --Sumner Boast, APRN - CNP on 06/28/2018 at 4:24 PM

## 2018-06-29 LAB — CBC WITH AUTO DIFFERENTIAL
Basophils %: 1 % (ref 0–2)
Basophils Absolute: 0.07 10*3/uL (ref 0.00–0.20)
Eosinophils %: 1 % (ref 1–4)
Eosinophils Absolute: 0.15 k/uL (ref 0.00–0.44)
Hematocrit: 44.6 % (ref 36.3–47.1)
Hemoglobin: 12.4 g/dL (ref 11.9–15.1)
Immature Granulocytes %: 1 % — ABNORMAL HIGH
Immature Granulocytes Absolute: 0.11 k/uL (ref 0.00–0.30)
Lymphocytes Absolute: 1.75 k/uL (ref 1.10–3.70)
Lymphocytes: 17 % — ABNORMAL LOW (ref 24–43)
MCH: 32 pg (ref 25.2–33.5)
MCHC: 27.8 g/dL (ref 25.2–33.5)
MCV: 115.2 fL — ABNORMAL HIGH (ref 82.6–102.9)
MPV: 11.5 fL (ref 8.1–13.5)
Monocytes %: 12 % (ref 3–12)
Monocytes Absolute: 1.26 k/uL — ABNORMAL HIGH (ref 0.10–1.20)
NRBC Automated: 0 per 100 WBC
Neutrophils Absolute: 7.22 k/uL (ref 1.50–8.10)
Platelets: 169 10*3/uL (ref 138–453)
RBC: 3.87 m/uL — ABNORMAL LOW (ref 3.95–5.11)
RDW: 16.2 % — ABNORMAL HIGH (ref 11.8–14.4)
Seg Neutrophils: 68 % — ABNORMAL HIGH (ref 36–65)
WBC: 10.6 10*3/uL (ref 3.5–11.3)

## 2018-06-29 LAB — BASIC METABOLIC PANEL
Anion Gap: 8 mmol/L — ABNORMAL LOW (ref 9–17)
BUN/Creatinine Ratio: 14 (ref 9–20)
BUN: 12 mg/dL (ref 8–23)
CO2: 27 mmol/L (ref 20–31)
Calcium: 8.5 mg/dL — ABNORMAL LOW (ref 8.6–10.4)
Chloride: 100 mmol/L (ref 98–107)
Creatinine: 0.86 mg/dL (ref 0.50–0.90)
GFR African American: >60 mL/min (ref >60)
GFR Non-African American: >60 mL/min (ref >60)
Glucose: 106 mg/dL — ABNORMAL HIGH (ref 70–99)
Potassium: 3.9 mmol/L (ref 3.7–5.3)
Sodium: 135 mmol/L (ref 135–144)

## 2018-06-29 LAB — EKG 12-LEAD
Atrial Rate: 70 {beats}/min
P Axis: 44 degrees
P-R Interval: 148 ms
Q-T Interval: 420 ms
QRS Duration: 70 ms
QTc Calculation (Bazett): 453 ms
R Axis: 48 degrees
T Axis: 20 degrees
Ventricular Rate: 70 {beats}/min

## 2018-06-29 MED ORDER — NYSTATIN 100000 UNIT/ML MT SUSP
100000 UNIT/ML | Freq: Four times a day (QID) | OROMUCOSAL | Status: DC
Start: 2018-06-29 — End: 2018-06-30
  Administered 2018-06-29 – 2018-06-30 (×5): 500000 mL via ORAL

## 2018-06-29 MED ORDER — METRONIDAZOLE 250 MG PO TABS
250 MG | Freq: Three times a day (TID) | ORAL | Status: DC
Start: 2018-06-29 — End: 2018-06-30
  Administered 2018-06-30: 12:00:00 500 mg via ORAL

## 2018-06-29 MED ORDER — PANTOPRAZOLE SODIUM 40 MG PO TBEC
40 MG | Freq: Every day | ORAL | Status: DC
Start: 2018-06-29 — End: 2018-06-30
  Administered 2018-06-30: 10:00:00 40 mg via ORAL

## 2018-06-29 MED ORDER — BETHANECHOL CHLORIDE 25 MG PO TABS
25 MG | Freq: Four times a day (QID) | ORAL | Status: DC
Start: 2018-06-29 — End: 2018-06-30
  Administered 2018-06-29 – 2018-06-30 (×5): 25 mg via ORAL

## 2018-06-29 MED ORDER — CIPROFLOXACIN HCL 250 MG PO TABS
250 MG | Freq: Two times a day (BID) | ORAL | Status: DC
Start: 2018-06-29 — End: 2018-06-30
  Administered 2018-06-30: 12:00:00 500 mg via ORAL

## 2018-06-29 MED FILL — METRONIDAZOLE IN NACL 5-0.79 MG/ML-% IV SOLN: 5 mg/mL | INTRAVENOUS | Qty: 100

## 2018-06-29 MED FILL — NYSTATIN 100000 UNIT/ML MT SUSP: 100000 UNIT/ML | OROMUCOSAL | Qty: 60

## 2018-06-29 MED FILL — CIPROFLOXACIN IN D5W 400 MG/200ML IV SOLN: 400 MG/200ML | INTRAVENOUS | Qty: 200

## 2018-06-29 MED FILL — LORAZEPAM 0.5 MG PO TABS: 0.5 MG | ORAL | Qty: 1

## 2018-06-29 MED FILL — PROTONIX 40 MG IV SOLR: 40 MG | INTRAVENOUS | Qty: 40

## 2018-06-29 MED FILL — BETHANECHOL CHLORIDE 25 MG PO TABS: 25 MG | ORAL | Qty: 1

## 2018-06-29 MED FILL — CITALOPRAM HYDROBROMIDE 20 MG PO TABS: 20 MG | ORAL | Qty: 1

## 2018-06-29 NOTE — Consults (Signed)
Name:  Katie Juarez  Age:  78 y.o.   DOB:  07-10-1940    Physician: Monna Fam, MD       Chief Complaint: Lower abdominal pain.      HPI: Patient was admitted yesterday afternoon with diverticulitis.  She began experiencing crampy lower abdominal pain about 6 or 7 days ago.  Was seen roughly 4 days ago in urgent care.  Diagnosis of acute diverticulitis was made both clinically and by CT scan.  She was placed on oral ciprofloxacin and metronidazole.  She awakened yesterday she felt worse.  Addition the crampy lower abdominal pain and diarrhea he had generalized malaise.  No particular specific complaint but extremely fatigued and remind her very much of previous bowel obstruction.  Denies any nausea or vomiting but she has had diarrhea with this.  Has had a low-grade fever.  He is actually feeling better today.2    She has a known history of diverticulitis and also had surgery for colon obstruction a couple years ago in [place].  Unfortunately we do not have any records of that.  Apparently also had a colonoscopy done in [place] couple years ago given we do not have a record of that in her chart.        MEDICAL HISTORY:    Past Medical History:        Diagnosis Date   ??? Breast cancer ([place]) 1999    in remission   ??? DDD (degenerative disc disease)    ??? Large bowel obstruction ([place]) 02/2015    [facility]   ??? LBP (low back pain)    ??? SS (spinal stenosis)        Past Surgical History:        Procedure Laterality Date   ??? APPENDECTOMY  1956   ??? BLADDER SUSPENSION  1990   ??? BREAST LUMPECTOMY Right 1999   ??? CHOLECYSTECTOMY  1990   ??? HYSTERECTOMY  1980    Done for endometriosis; done in [place]   ??? OTHER SURGICAL HISTORY  11/14/13, 06/23/11, 06/30/11, 07/12/11 12/29/11    L4/L5 IESI   ??? OTHER SURGICAL HISTORY  12/17/13    L5/S1 IESI   ??? OTHER SURGICAL HISTORY Bilateral 05/30/14    SIJ and piriformis   ??? OTHER SURGICAL HISTORY Bilateral 09/16/2014    L3, L4, L5 Diagnostic Medial Branch Block   ??? OTHER SURGICAL HISTORY  Bilateral 09/26/2014    L5 TFE   ??? OTHER SURGICAL HISTORY  10/10/2014    bil L5 TFE   ??? OTHER SURGICAL HISTORY  10/31/2014    caudal   ??? TONSILLECTOMY  1946       Prior to Admission medications    Medication Sig Start Date End Date Taking? Authorizing Provider   ciprofloxacin (CIPRO) 500 MG tablet Take 1 tablet by mouth 2 times daily 06/25/18  Yes Nell Range, MD   metroNIDAZOLE (FLAGYL) 500 MG tablet Take 1 tablet by mouth 3 times daily for 10 days 06/25/18 07/05/18 Yes Nell Range, MD   citalopram (CELEXA) 20 MG tablet Take 1 tablet by mouth daily 06/20/18  Yes [name], DO   LORazepam (ATIVAN) 0.5 MG tablet TAKE 1 TABLET BY MOUTH NIGHTLY AS NEEDED FOR ANXIETY 06/20/18 09/14/18 Yes [name], DO   budesonide-formoterol (SYMBICORT) 160-4.5 MCG/ACT AERO Inhale 2 puffs into the lungs 2 times daily Rinse mouth or brush teeth after each use. 05/07/18  Yes [name], DO   atorvastatin (  LIPITOR) 20 MG tablet Take 1 tablet by mouth daily 04/18/18  Yes [name], DO   Multiple Vitamins-Minerals (THRIVE FOR LIFE WOMENS) TABS Take by mouth   Yes Historical Provider, MD   docusate sodium (COLACE) 100 MG capsule Take 100 mg by mouth 2 times daily   Yes Historical Provider, MD   polyvinyl alcohol-povidone (HYPOTEARS) 1.4-0.6 % ophthalmic solution Place 1-2 drops into both eyes as needed.   Yes Historical Provider, MD   fluticasone Asencion Islam) 50 MCG/ACT nasal spray 1 spray by Nasal route daily 05/16/17 06/15/17  Jessica A Hermiller, APRN - CNP   albuterol sulfate HFA (VENTOLIN HFA) 108 (90 Base) MCG/ACT inhaler Ventolin HFA 90 mcg/actuation aerosol inhaler   Inhale 2 puffs every 6 hours by inhalation route as needed.   ASTHMA    Historical Provider, MD   aspirin 81 MG tablet Take 81 mg by mouth daily.    Historical Provider, MD       Allergies   Allergen Reactions   ??? Morphine Other (See Comments)     Night sweats,felt "loopy".   ??? Pcn [Penicillins] Hives   ??? Zithromax [Azithromycin] Hives        reports that she quit  smoking about 30 years ago. She started smoking about 65 years ago. She has a 40.00 pack-year smoking history. She has never used smokeless tobacco.  reports no history of alcohol use.    Family History   Problem Relation Age of Onset   ??? Other Mother         ALS - diagnosed at age 98   ??? Stroke Father         age 24   ??? Cancer Sister         pt thinks it was liver   ??? Cancer Brother         leukemia   ??? Diabetes Maternal Grandfather    ??? Other Brother         drowned   ??? Other Brother         died at age 52; think due to spinal meningitis   ??? Lupus Sister    ??? Alzheimer's Disease Sister    ??? Dementia Sister             REVIEW OF SYSTEMS:  General:  No fever, chills or weight loss  Eye:  negative   [id number]  Allergy/Immunology:  negative  Hematology/Lymphatic: negative  Lungs: No cough, shortness or breath  Cardiovascular: No chest pain or palpatations  Gastrointestinal: No nausea, vomiting, hematochezia or melena.  She states she had bowel removed with he bowel obstruction.  He scar is in the RLQ, making me thinks she had a strangulated hernia, but patient does not know  GU: No difficulty urinated, pus or blood in urine  Neurological: negative      PHYSICAL EXAM:    BP 134/63    Pulse 63    Temp 98 ??F (36.7 ??C) (Temporal)    Resp 16    Ht 5\' 5"  (1.651 m)    Wt 147 lb 11.2 oz (67 kg)    LMP  (LMP Unknown)    SpO2 90%    BMI 24.58 kg/m??       Gen: Alert and oriented x3, no acute distress, well-appearing    Eyes: PERRL, Sclera Anicteric    Head: Normocephalic, non tender     Neck: Supple, no significant adenopathy. No carotid bruits, thyroid normal size and no masses  Chest: CTA, no wheezes, no rales, no rhonchi, symmetrical    Heart: Normal rate, regular rhythm, no murmurs    Abdomen: Soft, positive bowel sounds, mild diffuse lower abdominal tenderness. No guarding or rebound. No masses.    Neuro: Normal speech, motor/sensory grossly normal bilateral    MSK: No joint tenderness, deformity, or swelling       CT  scan and lab work reviewed    ASSESSMENT:  1) Acute Diverticulitis    PLAN:  1) Improved since admission  2) Start diet.  Probably could go home later if she continues to feel better.  3) Try track down previous colonoscopy report (and surgical report if possible).    Electronically signed by Monna Fam, MD on 06/29/2018 at 11:38 AM

## 2018-06-29 NOTE — Progress Notes (Signed)
SW completed a Psychosocial Assessment for evaluation of patient's mental health, social status, and functional capacity within the community. Patient is a 78 year old caucasian female admitted due to Diverticulitis. Patient was accompanied by her husband. Patient was alert, cooperative, and engaging during assessment.    Patient lives in [name] with her husband. Patient states she is retired and takes care of her husband. SW provided supportive listening while patient discussed positive support from her family, church, friends, neighbors, and community. Patient states she does not use durable medical equipment or outside community services. Patient states she is independent in her ADLs and able to drive herself to appointments. Patient discussed home health services regarding her husband. Patient reports currently she is not interested in home health aids at this time. SW provided education and resources.    Patient PCP is Dr. Renard Hamper and reports no issues affording her medication.      Patient wishes to be discharged home with no additional services. Patient is a full code status at this time. Patient states she has a advance directive and her PCP has a copy. SW provided business card. SW will continue to monitor needs and assist as appropriate.        [name], LSW  06/29/2018

## 2018-06-29 NOTE — Progress Notes (Signed)
Hospitalist Progress Note    Patient:  Katie Juarez     Date of Birth: December 25, 1940    MRN: [id number]   Admit date: 06/28/2018     Acct: [id number]     PCP: [name], DO    CC--Interval History:   Sigmoid diverticulitis---possible abscess formation--on Cipro--Flagyl---pain improved---WBC = 10.6---afebrile    ASCVD    COPD-emphysema     Possible early oral thrush    Urinary retention---foley--urecholine    See note below    All other ROS negative except noted in HPI    Diet:  DIET CLEAR LIQUID;    Medications:  Scheduled Meds:  ??? nystatin  5 mL Oral 4x Daily   ??? bethanechol  25 mg Oral 4x Daily   ??? metroNIDAZOLE  500 mg Intravenous Q8H   ??? sodium chloride flush  10 mL Intravenous 2 times per day   ??? budesonide-formoterol  2 puff Inhalation BID   ??? pantoprazole  40 mg Intravenous Daily    And   ??? sodium chloride (PF)  10 mL Intravenous Daily   ??? citalopram  20 mg Oral Nightly   ??? ciprofloxacin  400 mg Intravenous Q12H     Continuous Infusions:  ??? lactated ringers 125 mL/hr at 06/29/18 0551     PRN Meds:HYDROmorphone, sodium chloride flush, polyethylene glycol, ondansetron, LORazepam, acetaminophen **OR** acetaminophen    Objective:  Labs:  CBC with Differential:    Lab Results   Component Value Date    WBC 10.6 06/29/2018    RBC 3.87 06/29/2018    HGB 12.4 06/29/2018    HCT 44.6 06/29/2018    PLT 169 06/29/2018    MCV 115.2 06/29/2018    MCH 32.0 06/29/2018    MCHC 27.8 06/29/2018    RDW 16.2 06/29/2018    LYMPHOPCT 17 06/29/2018    MONOPCT 12 06/29/2018    BASOPCT 1 06/29/2018    MONOSABS 1.26 06/29/2018    LYMPHSABS 1.75 06/29/2018    EOSABS 0.15 06/29/2018    BASOSABS 0.07 06/29/2018    DIFFTYPE NOT REPORTED 06/29/2018     BMP:    Lab Results   Component Value Date    NA 135 06/29/2018    K 3.9 06/29/2018    CL 100 06/29/2018    CO2 27 06/29/2018    BUN 12 06/29/2018    LABALBU 4.0 09/27/2017    CREATININE 0.86 06/29/2018    CALCIUM 8.5 06/29/2018    GFRAA >60 06/29/2018    LABGLOM >60 06/29/2018    GLUCOSE  106 06/29/2018    GLUCOSE 94 09/27/2017           Physical Exam:  Vitals: BP 126/67    Pulse 63    Temp 97.3 ??F (36.3 ??C) (Temporal)    Resp 16    Ht 5\' 5"  (1.651 m)    Wt 147 lb 11.2 oz (67 kg)    LMP  (LMP Unknown)    SpO2 92%    BMI 24.58 kg/m??   24 hour intake/output:    Intake/Output Summary (Last 24 hours) at 06/29/2018 1249  Last data filed at 06/29/2018 1201  Gross per 24 hour   Intake 1488.56 ml   Output 1225 ml   Net 263.56 ml     Last 3 weights:  Wt Readings from Last 3 Encounters:   06/29/18 147 lb 11.2 oz (67 kg)   06/28/18 148 lb (67.1 kg)   06/25/18 148 lb (67.1 kg)  HEENT: Normocephalic and Atraumatic  Neck: Supple, No Masses, Tenderness, Nodularity and No Lymphadenopathy  Chest/Lungs: Clear to Auscultation without Rales, Rhonchi, or Wheezes  Cardiac: Regular Rate and Rhythm without Rubs, Clicks, Gallops, or Murmurs  GI/Abdomen: Bowel Sounds Present and Soft, tenderness LLQ and suprapubic regions without Guarding or Rebound Tenderness  GU: suprapubic--fullness---tenderness  EXT/Skin: No Edema, No Cyanosis and No Clubbing  Neuro: Alert and Oriented and No Localizing Signs/Symptoms      Assessment:    Active Problems:    Diverticulitis  Resolved Problems:    * No resolved hospital problems. *    Murillo, Loribeth  V  95 WF  [kk Black, FP;  [employer], GS]  FULL CODE     SCDs     FOLEY----6.19.2020    Anti-infectives:  Cipro IV, Flagyl IV, nystatin swish-swallow    Diverticulitis--sigmoid---possible abscess formation---6.18.2020         CT abdomen--pelvis---6.18.2020---sigmoid diverticulitis--mild progression--                             increased colonic wall thickening and pericolonic inflammatory                             changes---questionable developing intramural abscess--no evidence                              of perforation--no other acute findings         CT abdomen-pelvis---6.15.2020---mild pericolonic inflammatory changes consistent                              with uncomplicated  diverticulitis mid sigmoid colon--no other acute findings--                              no evidence of obstructive uropathy--previous cholecystectomy--                              hysterectomy--small bowel resection  ASCVD         EKG----8.18.2020----NSR--70--NACs         Cardiac catheterization----angioplasty--2009  CKD---Stage 3  Urinary retention--6.19.2020---foley---urecholine   Arrhythmia   COPD---emphysema   Asthma   GERD  Chronic pain syndrome        DDD        Lumbar facet syndrome        LS disc displacement        Spondylolisthesis multiple sites spine        SIJ pain---bilaterally        Left piriformis syndrome        Lumbar radicular pain        Chronic low back pain        Lumber spinal stenosis  Depression---stressor--husband Alzheimer's  Headache   Tobacco abuse----1 PPD x 35 years---quit--c. 1990  HEARING IMPAIRMENT  Oral pain--early thrush  PMH:   anemia, bronchitis, OA, breast carcinoma---1999--remission, cataracts, large               bowel obstruction---2017, hematuria, recurrent UTIs  PSH:    hysterectomy--cervix unknown--1980---endometriosis, bladder suspension--AP repair---1990,               cholecystectomy---1990, right breast lumpectomy--1999, tonsillectomy---1946, bacteremia,  appendectomy---1956, multiple bilateral pain management injection--variously L5-S1--              SIJ--piriformis--L3-4-5--caudal, cystoscopy--2008--laterally displace right UO--              likely right reflux, cataract extraction--IOL--OU, hernia repair with bowel obstruction    Allergies:  penicillin---hives, Zithromax--azithromycin---hives  Intolerances:  morphine--night sweats--???loopy???      Plan:  1.  Diverticulitis----Cipro--Flagyl---IVF--General Surgery  2.  Urinary retention----foley--urecholine  3.  Early oral thrush---nystatin swish-swallow  4.  See orders    Electronically signed by Janene Harvey on 06/29/2018 at 12:49 PM    Hospitalist

## 2018-06-29 NOTE — Other (Signed)
Nurse notified chaplain that patient was no longer NPO and could receive Communion.  Chaplain prayed with patient and gave Communion to patient.  Patient thanked Clinical biochemist.    Electronically signed by Dyke Maes on 06/29/2018 at 12:30 PM

## 2018-06-29 NOTE — Other (Signed)
Chaplain rounding on unit. Communion visit.    Assessment:  Patient was calm and engaged well in conversation. She spent time reminiscing and talking about her family.  She said her husband has dementia and deals with back pain that requires a lot of energy from her to take care of him.  She said her children are a great support.  Patient was unable to receive Communion as she was NPO.    Intervention: Chaplain provided a listening and supportive presence.     Outcome: Patient expressed her appreciation for visit and chaplain's listening ear.    Plan: Chaplains will remain available to offer spiritual and emotional support as needed.       06/29/18 1156   Encounter Summary   Services provided to: Patient   Referral/Consult From: [place] [facility];Spouse   Place of Worship [facility]   Contact Congregation No   Continue Visiting   (06/29/2018)   Complexity of Encounter Low   Length of Encounter 30 minutes   Spiritual Assessment Completed Yes   Spiritual/Religious   Type Spiritual support   Assessment Calm;Approachable;Coping   Intervention Active listening;Explored feelings, thoughts, concerns   Outcome Comfort;Expressed gratitude;Engaged in conversation;Expressed feelings/needs/concerns;Shared reminiscences;Receptive     Electronically signed by Dyke Maes on 06/29/2018 at 12:01 PM

## 2018-06-29 NOTE — Progress Notes (Signed)
SW attempted to meet with patient to complete Psychosocial Assessment. Patients nurses were present. Patient received visitor after nurses met with patient. SW will continue to monitor needs and assist as appropriate.      [name], LSW  06/29/2018

## 2018-06-29 NOTE — Plan of Care (Signed)
Problem: DISCHARGE BARRIERS  Goal: Patient's continuum of care needs are met  Outcome: Met This Shift  Pt denies any discharge needs at present time. Pt currently lives at home with her husband.

## 2018-06-29 NOTE — Plan of Care (Signed)
Problem: Bowel/Gastric:  Goal: Control of bowel function will improve  Description: Control of bowel function will improve  06/29/2018 0854 by Elby Beck, RN  Outcome: Ongoing  06/28/2018 2234 by Encarnacion Slates, RN  Outcome: Ongoing  Goal: Ability to achieve a regular elimination pattern will improve  Description: Ability to achieve a regular elimination pattern will improve  06/29/2018 0854 by Elby Beck, RN  Outcome: Ongoing  06/28/2018 2234 by Encarnacion Slates, RN  Outcome: Ongoing     Problem: Nutritional:  Goal: Ability to follow a diet with enough fiber (20 to 30 grams) for normal bowel function will improve  Description: Ability to follow a diet with enough fiber (20 to 30 grams) for normal bowel function will improve  06/29/2018 0854 by Elby Beck, RN  Outcome: Ongoing  06/28/2018 2234 by Encarnacion Slates, RN  Outcome: Ongoing     Problem: Infection:  Goal: Will remain free from infection  Description: Will remain free from infection  06/29/2018 0854 by Elby Beck, RN  Outcome: Ongoing  06/28/2018 2234 by Encarnacion Slates, RN  Outcome: Ongoing     Problem: Safety:  Goal: Free from accidental physical injury  Description: Free from accidental physical injury  06/29/2018 0854 by Elby Beck, RN  Outcome: Ongoing  06/28/2018 2234 by Encarnacion Slates, RN  Outcome: Ongoing  Goal: Free from intentional harm  Description: Free from intentional harm  06/29/2018 0854 by Elby Beck, RN  Outcome: Ongoing  06/28/2018 2234 by Encarnacion Slates, RN  Outcome: Ongoing     Problem: Daily Care:  Goal: Daily care needs are met  Description: Daily care needs are met  06/29/2018 0854 by Elby Beck, RN  Outcome: Ongoing  06/28/2018 2234 by Encarnacion Slates, RN  Outcome: Ongoing     Problem: Pain:  Goal: Patient's pain/discomfort is manageable  Description: Patient's pain/discomfort is manageable  06/29/2018 0854 by Elby Beck, RN  Outcome: Ongoing  06/28/2018 2234 by Encarnacion Slates, RN  Outcome: Ongoing     Problem: Discharge  Planning:  Goal: Patients continuum of care needs are met  Description: Patients continuum of care needs are met  06/29/2018 0854 by Elby Beck, RN  Outcome: Ongoing  06/28/2018 2234 by Encarnacion Slates, RN  Outcome: Ongoing

## 2018-06-29 NOTE — Progress Notes (Signed)
Physical Therapy    Facility/Department: [facility]  PROGRESSIVE CARE  Initial Assessment    NAME: Katie Juarez  DOB: [birth year]  MRN: [id number]    Date of Service: 06/29/2018    Discharge Recommendations:  Home independently        Assessment   Prognosis: Good  Decision Making: Low Complexity  No Skilled PT: Independent with functional mobility   REQUIRES PT FOLLOW UP: No  Activity Tolerance  Activity Tolerance: Patient Tolerated treatment well       Patient Diagnosis(es): There were no encounter diagnoses.     has a past medical history of Breast cancer (HCC), DDD (degenerative disc disease), Large bowel obstruction (HCC), LBP (low back pain), and SS (spinal stenosis).   has a past surgical history that includes Hysterectomy (1980); bladder suspension (1990); Cholecystectomy (1990); Breast lumpectomy (Right, 1999); other surgical history (11/14/13, 06/23/11, 06/30/11, 07/12/11 12/29/11); other surgical history (12/17/13); other surgical history (Bilateral, 05/30/14); other surgical history (Bilateral, 09/16/2014); other surgical history (Bilateral, 09/26/2014); other surgical history (10/10/2014); other surgical history (10/31/2014); Tonsillectomy (1946); and Appendectomy (1956).    Restrictions     Vision/Hearing        Subjective  General  Chart Reviewed: Yes  Patient assessed for rehabilitation services?: Yes  Response To Previous Treatment: Not applicable  Family / Caregiver Present: Yes  Follows Commands: Within Functional Limits  Pain Screening  Patient Currently in Pain: Yes  Pain Assessment  Pain Assessment: 0-10  Pain Level: 3  Pain Type: Acute pain  Pain Location: Abdomen  Vital Signs  Patient Currently in Pain: Yes       Orientation  Orientation  Overall Orientation Status: Within Normal Limits  Social/Functional History  Social/Functional History  Lives With: Spouse  Type of Home: House  Home Layout: One level  Bathroom Accessibility: Accessible  ADL Assistance: Independent  Homemaking Assistance:  Independent  Ambulation Assistance: Independent  Transfer Assistance: Independent  Active Driver: Yes  Mode of Transportation: Set designer  Occupation: Retired  IT consultant        Objective          AROM RLE (degrees)  RLE AROM: WNL  AROM LLE (degrees)  LLE AROM : WNL  Strength RLE  Strength RLE: WNL  Strength LLE  Strength LLE: WNL        Bed mobility  Rolling to Left: Independent  Rolling to Right: Independent  Supine to Sit: Independent  Sit to Supine: Independent  Scooting: Independent  Transfers  Sit to Stand: Independent  Stand to sit: Independent  Ambulation  Ambulation?: Yes  Ambulation 1  Surface: level tile  Device: No Device  Assistance: Independent  Gait Deviations: None     Balance  Sitting - Static: Good  Sitting - Dynamic: Good  Standing - Static: Good  Standing - Dynamic: Good        Plan   Safety Devices  Type of devices: Left in bed, Call light within reach    G-Code       OutComes Score                                                  AM-PAC Score             Goals  Short term goals  Time Frame for Short term goals: 1 day  Short term goal 1: Assess  functional status       Therapy Time   Individual Concurrent Group Co-treatment   Time In 1406         Time Out 1416         Minutes 10                 Graciella Belton, [place]

## 2018-06-30 LAB — CBC WITH AUTO DIFFERENTIAL
Basophils %: 1 % (ref 0–2)
Basophils Absolute: 0.06 10*3/uL (ref 0.00–0.20)
Eosinophils %: 2 % (ref 1–4)
Eosinophils Absolute: 0.18 k/uL (ref 0.00–0.44)
Hematocrit: 38.6 % (ref 36.3–47.1)
Hemoglobin: 12.7 g/dL (ref 11.9–15.1)
Immature Granulocytes %: 1 % — ABNORMAL HIGH
Immature Granulocytes Absolute: 0.1 k/uL (ref 0.00–0.30)
Lymphocytes Absolute: 1.5 k/uL (ref 1.10–3.70)
Lymphocytes: 15 % — ABNORMAL LOW (ref 24–43)
MCH: 30.7 pg (ref 25.2–33.5)
MCHC: 32.9 g/dL (ref 25.2–33.5)
MCV: 93.2 fL (ref 82.6–102.9)
MPV: 10.7 fL (ref 8.1–13.5)
Monocytes %: 11 % (ref 3–12)
Monocytes Absolute: 1.11 k/uL (ref 0.10–1.20)
NRBC Automated: 0 per 100 WBC
Neutrophils Absolute: 7.08 k/uL (ref 1.50–8.10)
Platelets: 185 10*3/uL (ref 138–453)
RBC: 4.14 m/uL (ref 3.95–5.11)
RDW: 13.2 % (ref 11.8–14.4)
Seg Neutrophils: 71 % — ABNORMAL HIGH (ref 36–65)
WBC: 10 10*3/uL (ref 3.5–11.3)

## 2018-06-30 LAB — BASIC METABOLIC PANEL
Anion Gap: 11 mmol/L (ref 9–17)
BUN/Creatinine Ratio: 14 (ref 9–20)
BUN: 11 mg/dL (ref 8–23)
CO2: 25 mmol/L (ref 20–31)
Calcium: 8.5 mg/dL — ABNORMAL LOW (ref 8.6–10.4)
Chloride: 104 mmol/L (ref 98–107)
Creatinine: 0.79 mg/dL (ref 0.50–0.90)
GFR African American: >60 mL/min (ref >60)
GFR Non-African American: >60 mL/min (ref >60)
Glucose: 99 mg/dL (ref 70–99)
Potassium: 4.1 mmol/L (ref 3.7–5.3)
Sodium: 140 mmol/L (ref 135–144)

## 2018-06-30 MED ORDER — BETHANECHOL CHLORIDE 25 MG PO TABS
25 MG | ORAL_TABLET | Freq: Four times a day (QID) | ORAL | 0 refills | Status: DC
Start: 2018-06-30 — End: 2018-08-01

## 2018-06-30 MED FILL — PANTOPRAZOLE SODIUM 40 MG PO TBEC: 40 MG | ORAL | Qty: 1

## 2018-06-30 MED FILL — METRONIDAZOLE IN NACL 5-0.79 MG/ML-% IV SOLN: 5 mg/mL | INTRAVENOUS | Qty: 100

## 2018-06-30 MED FILL — METRONIDAZOLE 250 MG PO TABS: 250 MG | ORAL | Qty: 2

## 2018-06-30 MED FILL — BETHANECHOL CHLORIDE 25 MG PO TABS: 25 MG | ORAL | Qty: 1

## 2018-06-30 MED FILL — CITALOPRAM HYDROBROMIDE 20 MG PO TABS: 20 MG | ORAL | Qty: 1

## 2018-06-30 MED FILL — CIPROFLOXACIN HCL 250 MG PO TABS: 250 MG | ORAL | Qty: 2

## 2018-06-30 MED FILL — LORAZEPAM 0.5 MG PO TABS: 0.5 MG | ORAL | Qty: 1

## 2018-06-30 NOTE — Plan of Care (Signed)
Problem: Bowel/Gastric:  Goal: Control of bowel function will improve  Description: Control of bowel function will improve  06/30/2018 1501 by Richarda Overlie, RN  Outcome: Completed  06/30/2018 0955 by Richarda Overlie, RN  Outcome: Ongoing  06/30/2018 0546 by Rogene Houston, RN  Outcome: Ongoing  Goal: Ability to achieve a regular elimination pattern will improve  Description: Ability to achieve a regular elimination pattern will improve  06/30/2018 1501 by Richarda Overlie, RN  Outcome: Completed  06/30/2018 0955 by Richarda Overlie, RN  Outcome: Ongoing  06/30/2018 0546 by Rogene Houston, RN  Outcome: Ongoing  Goal: Bowel function will improve  Description: Bowel function will improve  06/30/2018 1501 by Richarda Overlie, RN  Outcome: Completed  06/30/2018 0955 by Richarda Overlie, RN  Outcome: Ongoing  06/30/2018 0546 by Rogene Houston, RN  Outcome: Ongoing  Goal: Complications related to the disease process, condition or treatment will be avoided or minimized  Description: Complications related to the disease process, condition or treatment will be avoided or minimized  06/30/2018 1501 by Richarda Overlie, RN  Outcome: Completed  06/30/2018 0955 by Richarda Overlie, RN  Outcome: Ongoing  06/30/2018 0546 by Rogene Houston, RN  Outcome: Ongoing     Problem: Nutritional:  Goal: Ability to follow a diet with enough fiber (20 to 30 grams) for normal bowel function will improve  Description: Ability to follow a diet with enough fiber (20 to 30 grams) for normal bowel function will improve  06/30/2018 1501 by Richarda Overlie, RN  Outcome: Completed  06/30/2018 0955 by Richarda Overlie, RN  Outcome: Ongoing  06/30/2018 0546 by Rogene Houston, RN  Outcome: Ongoing  Goal: Nutritional status will improve  Description: Nutritional status will improve  06/30/2018 1501 by Richarda Overlie, RN  Outcome: Completed  06/30/2018 0955 by Richarda Overlie, RN  Outcome: Ongoing  06/30/2018 0546 by Rogene Houston, RN  Outcome: Ongoing     Problem: Infection:  Goal: Will remain free from infection  Description: Will  remain free from infection  06/30/2018 1501 by Richarda Overlie, RN  Outcome: Completed  06/30/2018 0955 by Richarda Overlie, RN  Outcome: Ongoing  06/30/2018 0546 by Rogene Houston, RN  Outcome: Ongoing     Problem: Safety:  Goal: Free from accidental physical injury  Description: Free from accidental physical injury  06/30/2018 1501 by Richarda Overlie, RN  Outcome: Completed  06/30/2018 0955 by Richarda Overlie, RN  Outcome: Ongoing  06/30/2018 0546 by Rogene Houston, RN  Outcome: Ongoing  Goal: Free from intentional harm  Description: Free from intentional harm  06/30/2018 1501 by Richarda Overlie, RN  Outcome: Completed  06/30/2018 0955 by Richarda Overlie, RN  Outcome: Ongoing  06/30/2018 0546 by Rogene Houston, RN  Outcome: Ongoing     Problem: Daily Care:  Goal: Daily care needs are met  Description: Daily care needs are met  06/30/2018 1501 by Richarda Overlie, RN  Outcome: Completed  06/30/2018 0955 by Richarda Overlie, RN  Outcome: Ongoing  06/30/2018 0546 by Rogene Houston, RN  Outcome: Ongoing     Problem: Pain:  Goal: Patient's pain/discomfort is manageable  Description: Patient's pain/discomfort is manageable  06/30/2018 1501 by Richarda Overlie, RN  Outcome: Completed  06/30/2018 0955 by Richarda Overlie, RN  Outcome: Ongoing  06/30/2018 0546 by Rogene Houston, RN  Outcome: Ongoing  Goal: Pain level will decrease  Description: Pain level will decrease  06/30/2018 1501 by Richarda Overlie, RN  Outcome: Completed  06/30/2018 [phone]  by Richarda Overlie, RN  Outcome: Ongoing  06/30/2018 0546 by Rogene Houston, RN  Outcome: Ongoing  Goal: Control of acute pain  Description: Control of acute pain  06/30/2018 1501 by Richarda Overlie, RN  Outcome: Completed  06/30/2018 0955 by Richarda Overlie, RN  Outcome: Ongoing  06/30/2018 0546 by Rogene Houston, RN  Outcome: Ongoing  Goal: Control of chronic pain  Description: Control of chronic pain  06/30/2018 1501 by Richarda Overlie, RN  Outcome: Completed  06/30/2018 0955 by Richarda Overlie, RN  Outcome: Ongoing  06/30/2018 0546 by Rogene Houston, RN  Outcome: Ongoing     Problem:  Discharge Planning:  Goal: Patients continuum of care needs are met  Description: Patients continuum of care needs are met  06/30/2018 1501 by Richarda Overlie, RN  Outcome: Completed  06/30/2018 0955 by Richarda Overlie, RN  Outcome: Ongoing  06/30/2018 0546 by Rogene Houston, RN  Outcome: Ongoing     Problem: Activity:  Goal: Ability to tolerate increased activity will improve  Description: Ability to tolerate increased activity will improve  06/30/2018 1501 by Richarda Overlie, RN  Outcome: Completed  06/30/2018 0955 by Richarda Overlie, RN  Outcome: Ongoing  06/30/2018 0546 by Rogene Houston, RN  Outcome: Ongoing     Problem: Coping:  Goal: Ability to identify strategies to decrease anxiety will improve  Description: Ability to identify strategies to decrease anxiety will improve  06/30/2018 1501 by Richarda Overlie, RN  Outcome: Completed  06/30/2018 0955 by Richarda Overlie, RN  Outcome: Ongoing  06/30/2018 0546 by Rogene Houston, RN  Outcome: Ongoing     Problem: Fluid Volume:  Goal: Will maintain adequate fluid volume  Description: Will maintain adequate fluid volume  06/30/2018 1501 by Richarda Overlie, RN  Outcome: Completed  06/30/2018 0955 by Richarda Overlie, RN  Outcome: Ongoing  06/30/2018 0546 by Rogene Houston, RN  Outcome: Ongoing     Problem: Sensory:  Goal: Pain level will decrease  Description: Pain level will decrease  06/30/2018 1501 by Richarda Overlie, RN  Outcome: Completed  06/30/2018 0955 by Richarda Overlie, RN  Outcome: Ongoing  06/30/2018 0546 by Rogene Houston, RN  Outcome: Ongoing     Problem: Skin Integrity:  Goal: Skin integrity will be maintained  Description: Skin integrity will be maintained  06/30/2018 1501 by Richarda Overlie, RN  Outcome: Completed  06/30/2018 0955 by Richarda Overlie, RN  Outcome: Ongoing  06/30/2018 0546 by Rogene Houston, RN  Outcome: Ongoing     Problem: DISCHARGE BARRIERS  Goal: Patient's continuum of care needs are met  06/30/2018 1501 by Richarda Overlie, RN  Outcome: Completed  06/30/2018 0955 by Richarda Overlie, RN  Outcome: Ongoing  06/30/2018 0546 by  Rogene Houston, RN  Outcome: Ongoing     Problem: IP BALANCE  Goal: LTG - Patient will maintain balance to allow for safe/functional mobility  06/30/2018 1501 by Richarda Overlie, RN  Outcome: Completed  06/30/2018 0955 by Richarda Overlie, RN  Outcome: Ongoing  06/30/2018 0546 by Rogene Houston, RN  Outcome: Ongoing

## 2018-06-30 NOTE — Discharge Summary (Signed)
Discharge Summary    Katie Juarez Patient Status:  Inpatient   DOB 06-Feb-1940 MRN [id number]   Location [facility] Attending [facility] Day # 2 PCP [name], DO     Date of Admission: 06/28/2018    Date of Discharge: 06/30/2018    Admitting Diagnosis: Diverticulitis [K57.92]    Discharge Diagnosis: Active Problems:    Diverticulitis  Resolved Problems:    * No resolved hospital problems. *      Reason for Admission/HPI: Failed outapatient treatment for Acute diverticulktis    Hospital Course: Patient was on IV Cipro and flagyl ,symptoms improved tolerating diet.Has had urinary retention requiring foley on admission,foley was discontinued on day of discharge patient has voided,noi residual urine on bladder scan,outpatient urology consult also has had h/o recurrent UTI.    Consultations: General surgery    Procedures: None    Complications: None    Disposition: Home    Discharge Condition: good  Discharge Medications:    Nakira, [place]   Home Medication Instructions [id number]    Printed on:06/30/18 1438   Medication Information                      albuterol sulfate HFA (VENTOLIN HFA) 108 (90 Base) MCG/ACT inhaler  Ventolin HFA 90 mcg/actuation aerosol inhaler   Inhale 2 puffs every 6 hours by inhalation route as needed.   ASTHMA             aspirin 81 MG tablet  Take 81 mg by mouth daily.             atorvastatin (LIPITOR) 20 MG tablet  Take 1 tablet by mouth daily             bethanechol (URECHOLINE) 25 MG tablet  Take 1 tablet by mouth 4 times daily             budesonide-formoterol (SYMBICORT) 160-4.5 MCG/ACT AERO  Inhale 2 puffs into the lungs 2 times daily Rinse mouth or brush teeth after each use.             ciprofloxacin (CIPRO) 500 MG tablet  Take 1 tablet by mouth 2 times daily             citalopram (CELEXA) 20 MG tablet  Take 1 tablet by mouth daily             docusate sodium (COLACE) 100 MG capsule  Take 100 mg by mouth 2 times daily             fluticasone (FLONASE)  50 MCG/ACT nasal spray  1 spray by Nasal route daily             LORazepam (ATIVAN) 0.5 MG tablet  TAKE 1 TABLET BY MOUTH NIGHTLY AS NEEDED FOR ANXIETY             metroNIDAZOLE (FLAGYL) 500 MG tablet  Take 1 tablet by mouth 3 times daily for 10 days             Multiple Vitamins-Minerals (THRIVE FOR LIFE WOMENS) TABS  Take by mouth             polyvinyl alcohol-povidone (HYPOTEARS) 1.4-0.6 % ophthalmic solution  Place 1-2 drops into both eyes as needed.                 Follow up Visits: Follow-up with PCP in 1week.  Outpatient urology consult,f/u with surgery as  schedukled       Total Time spent with patient and coordinating care:  40 minutes    Carolynn Serve, MD  06/30/2018  2:38 PM

## 2018-06-30 NOTE — Progress Notes (Signed)
Hospitalist Progress Note  06/30/2018 1:09 PM  Subjective:   Admit Date: 06/28/2018  PCP: [name], DO    Interval History: Abdominal pain is improved ,tolearting diet ,has foley in place for urinary retention.    Diet: DIET DENTAL SOFT;  Medications:   Scheduled Meds:  ??? nystatin  5 mL Oral 4x Daily   ??? bethanechol  25 mg Oral 4x Daily   ??? pantoprazole  40 mg Oral QAM AC   ??? ciprofloxacin  500 mg Oral 2 times per day   ??? metroNIDAZOLE  500 mg Oral 3 times per day   ??? sodium chloride flush  10 mL Intravenous 2 times per day   ??? budesonide-formoterol  2 puff Inhalation BID   ??? citalopram  20 mg Oral Nightly     Continuous Infusions:  ??? lactated ringers 125 mL/hr at 06/30/18 0539       Patient's current medications documented, reviewed, and updated.      CBC:   Recent Labs     06/28/18  1345 06/29/18  0508 06/30/18  0515   WBC 15.7* 10.6 10.0   HGB 14.6 12.4 12.7   PLT 218 169 185     BMP:    Recent Labs     06/28/18  1444 06/29/18  0508 06/30/18  0515   NA 135 135 140   K 4.3 3.9 4.1   CL 95* 100 104   CO2 27 27 25    BUN 18 12 11    CREATININE 0.93* 0.86 0.79   GLUCOSE 113* 106* 99     Hepatic: No results for input(s): AST, ALT, ALB, BILITOT, ALKPHOS in the last 72 hours.  Troponin: No results for input(s): TROPONINI in the last 72 hours.  BNP: No results for input(s): BNP in the last 72 hours.  Lipids: No results for input(s): CHOL, HDL in the last 72 hours.    Invalid input(s): LDLCALCU  INR: No results for input(s): INR in the last 72 hours.      Objective:   Vitals: BP (!) 98/58    Pulse 62    Temp 98.1 ??F (36.7 ??C) (Oral)    Resp 20    Ht 5\' 5"  (1.651 m)    Wt 147 lb 4.8 oz (66.8 kg)    LMP  (LMP Unknown)    SpO2 92%    BMI 24.51 kg/m??   General appearance: alert and cooperative with exam  HEENT: Eye: Normal external eye, conjunctiva, lids cornea, PERL.  Neck: no adenopathy, no carotid bruit, no JVD, supple, symmetrical, trachea midline and thyroid not enlarged, symmetric, no tenderness/mass/nodules  Lungs:  clear to auscultation bilaterally  Heart: regular rate and rhythm, S1, S2 normal, no murmur, click, rub or gallop  Abdomen: soft, non-tender; bowel sounds normal; no masses,  no organomegaly  Extremities: extremities normal, atraumatic, no cyanosis or edema  Neurologic: Mental status: Alert, oriented, thought content appropriate    Assessment and Plan:   1. Diverticultis improved.  2. Urinary retention    Plan:Will try voiding trial today,if does ok home later today ,outpatient urology consult.    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

## 2018-06-30 NOTE — Discharge Instructions (Signed)
As tolerated

## 2018-06-30 NOTE — Plan of Care (Signed)
Problem: Bowel/Gastric:  Goal: Control of bowel function will improve  Description: Control of bowel function will improve  06/30/2018 0955 by Richarda Overlie, RN  Outcome: Ongoing  06/30/2018 0546 by Rogene Houston, RN  Outcome: Ongoing  Goal: Ability to achieve a regular elimination pattern will improve  Description: Ability to achieve a regular elimination pattern will improve  06/30/2018 0955 by Richarda Overlie, RN  Outcome: Ongoing  06/30/2018 0546 by Rogene [place], RN  Outcome: Ongoing  Goal: Bowel function will improve  Description: Bowel function will improve  06/30/2018 0955 by Richarda Overlie, RN  Outcome: Ongoing  06/30/2018 0546 by Rogene [place], RN  Outcome: Ongoing  Goal: Complications related to the disease process, condition or treatment will be avoided or minimized  Description: Complications related to the disease process, condition or treatment will be avoided or minimized  06/30/2018 0955 by Richarda Overlie, RN  Outcome: Ongoing  06/30/2018 0546 by Rogene [place], RN  Outcome: Ongoing     Problem: Nutritional:  Goal: Ability to follow a diet with enough fiber (20 to 30 grams) for normal bowel function will improve  Description: Ability to follow a diet with enough fiber (20 to 30 grams) for normal bowel function will improve  06/30/2018 0955 by Richarda Overlie, RN  Outcome: Ongoing  06/30/2018 0546 by Rogene Houston, RN  Outcome: Ongoing  Goal: Nutritional status will improve  Description: Nutritional status will improve  06/30/2018 0955 by Richarda Overlie, RN  Outcome: Ongoing  06/30/2018 0546 by Rogene Houston, RN  Outcome: Ongoing     Problem: Infection:  Goal: Will remain free from infection  Description: Will remain free from infection  06/30/2018 0955 by Richarda Overlie, RN  Outcome: Ongoing  06/30/2018 0546 by Rogene Houston, RN  Outcome: Ongoing     Problem: Safety:  Goal: Free from accidental physical injury  Description: Free from accidental physical injury  06/30/2018 0955 by Richarda Overlie, RN  Outcome: Ongoing  06/30/2018 0546 by  Rogene Houston, RN  Outcome: Ongoing  Goal: Free from intentional harm  Description: Free from intentional harm  06/30/2018 0955 by Richarda Overlie, RN  Outcome: Ongoing  06/30/2018 0546 by Rogene Houston, RN  Outcome: Ongoing     Problem: Daily Care:  Goal: Daily care needs are met  Description: Daily care needs are met  06/30/2018 0955 by Richarda Overlie, RN  Outcome: Ongoing  06/30/2018 0546 by Rogene Houston, RN  Outcome: Ongoing     Problem: Pain:  Goal: Patient's pain/discomfort is manageable  Description: Patient's pain/discomfort is manageable  06/30/2018 0955 by Richarda Overlie, RN  Outcome: Ongoing  06/30/2018 0546 by Rogene Houston, RN  Outcome: Ongoing  Goal: Pain level will decrease  Description: Pain level will decrease  06/30/2018 0955 by Richarda Overlie, RN  Outcome: Ongoing  06/30/2018 0546 by Rogene Houston, RN  Outcome: Ongoing  Goal: Control of acute pain  Description: Control of acute pain  06/30/2018 0955 by Richarda Overlie, RN  Outcome: Ongoing  06/30/2018 0546 by Rogene Houston, RN  Outcome: Ongoing  Goal: Control of chronic pain  Description: Control of chronic pain  06/30/2018 0955 by Richarda Overlie, RN  Outcome: Ongoing  06/30/2018 0546 by Rogene Houston, RN  Outcome: Ongoing     Problem: Discharge Planning:  Goal: Patients continuum of care needs are met  Description: Patients continuum of care needs are met  06/30/2018 0955 by Richarda Overlie, RN  Outcome: Ongoing  06/30/2018 0546 by Rogene Houston, RN  Outcome: Ongoing     Problem: Activity:  Goal: Ability to tolerate increased activity will improve  Description: Ability to tolerate increased activity will improve  06/30/2018 0955 by Richarda Overlie, RN  Outcome: Ongoing  06/30/2018 0546 by Rogene Houston, RN  Outcome: Ongoing     Problem: Coping:  Goal: Ability to identify strategies to decrease anxiety will improve  Description: Ability to identify strategies to decrease anxiety will improve  06/30/2018 0955 by Richarda Overlie, RN  Outcome: Ongoing  06/30/2018 0546 by Rogene Houston,  RN  Outcome: Ongoing     Problem: Fluid Volume:  Goal: Will maintain adequate fluid volume  Description: Will maintain adequate fluid volume  06/30/2018 0955 by Richarda Overlie, RN  Outcome: Ongoing  06/30/2018 0546 by Rogene Houston, RN  Outcome: Ongoing     Problem: Sensory:  Goal: Pain level will decrease  Description: Pain level will decrease  06/30/2018 0955 by Richarda Overlie, RN  Outcome: Ongoing  06/30/2018 0546 by Rogene Houston, RN  Outcome: Ongoing     Problem: Skin Integrity:  Goal: Skin integrity will be maintained  Description: Skin integrity will be maintained  06/30/2018 0955 by Richarda Overlie, RN  Outcome: Ongoing  06/30/2018 0546 by Rogene Houston, RN  Outcome: Ongoing     Problem: DISCHARGE BARRIERS  Goal: Patient's continuum of care needs are met  06/30/2018 0955 by Richarda Overlie, RN  Outcome: Ongoing  06/30/2018 0546 by Rogene Houston, RN  Outcome: Ongoing     Problem: IP BALANCE  Goal: LTG - Patient will maintain balance to allow for safe/functional mobility  06/30/2018 0955 by Richarda Overlie, RN  Outcome: Ongoing  06/30/2018 0546 by Rogene Houston, RN  Outcome: Ongoing

## 2018-06-30 NOTE — Plan of Care (Signed)
Problem: Bowel/Gastric:  Goal: Control of bowel function will improve  Description: Control of bowel function will improve  Outcome: Ongoing  Goal: Ability to achieve a regular elimination pattern will improve  Description: Ability to achieve a regular elimination pattern will improve  Outcome: Ongoing  Goal: Bowel function will improve  Description: Bowel function will improve  Outcome: Ongoing  Goal: Complications related to the disease process, condition or treatment will be avoided or minimized  Description: Complications related to the disease process, condition or treatment will be avoided or minimized  Outcome: Ongoing     Problem: Nutritional:  Goal: Ability to follow a diet with enough fiber (20 to 30 grams) for normal bowel function will improve  Description: Ability to follow a diet with enough fiber (20 to 30 grams) for normal bowel function will improve  Outcome: Ongoing  Goal: Nutritional status will improve  Description: Nutritional status will improve  Outcome: Ongoing     Problem: Infection:  Goal: Will remain free from infection  Description: Will remain free from infection  Outcome: Ongoing     Problem: Safety:  Goal: Free from accidental physical injury  Description: Free from accidental physical injury  Outcome: Ongoing  Goal: Free from intentional harm  Description: Free from intentional harm  Outcome: Ongoing

## 2018-06-30 NOTE — Plan of Care (Signed)
Problem: Bowel/Gastric:  Goal: Control of bowel function will improve  Description: Control of bowel function will improve  06/30/2018 0546 by Governor Specking, RN  Outcome: Ongoing  Goal: Ability to achieve a regular elimination pattern will improve  Description: Ability to achieve a regular elimination pattern will improve  06/30/2018 0546 by Governor Specking, RN  Outcome: Ongoing  Goal: Bowel function will improve  Description: Bowel function will improve  06/30/2018 0546 by Governor Specking, RN  Outcome: Ongoing  Goal: Complications related to the disease process, condition or treatment will be avoided or minimized  Description: Complications related to the disease process, condition or treatment will be avoided or minimized  06/30/2018 0546 by Governor Specking, RN  Outcome: Ongoing     Problem: Nutritional:  Goal: Ability to follow a diet with enough fiber (20 to 30 grams) for normal bowel function will improve  Description: Ability to follow a diet with enough fiber (20 to 30 grams) for normal bowel function will improve  06/30/2018 0546 by Governor Specking, RN  Outcome: Ongoing  Goal: Nutritional status will improve  Description: Nutritional status will improve  06/30/2018 0546 by Governor Specking, RN  Outcome: Ongoing     Problem: Infection:  Goal: Will remain free from infection  Description: Will remain free from infection  06/30/2018 0546 by Governor Specking, RN  Outcome: Ongoing     Problem: Safety:  Goal: Free from accidental physical injury  Description: Free from accidental physical injury  06/30/2018 0546 by Governor Specking, RN  Outcome: Ongoing  Goal: Free from intentional harm  Description: Free from intentional harm  06/30/2018 0546 by Governor Specking, RN  Outcome: Ongoing     Problem: Daily Care:  Goal: Daily care needs are met  Description: Daily care needs are met  06/30/2018 0546 by Governor Specking, RN  Outcome: Ongoing     Problem: Pain:  Goal: Patient's pain/discomfort is manageable  Description: Patient's  pain/discomfort is manageable  06/30/2018 0546 by Governor Specking, RN  Outcome: Ongoing  Goal: Pain level will decrease  Description: Pain level will decrease  06/30/2018 0546 by Governor Specking, RN  Outcome: Ongoing  Goal: Control of acute pain  Description: Control of acute pain  06/30/2018 0546 by Governor Specking, RN  Outcome: Ongoing  Goal: Control of chronic pain  Description: Control of chronic pain  06/30/2018 0546 by Governor Specking, RN  Outcome: Ongoing     Problem: Discharge Planning:  Goal: Patients continuum of care needs are met  Description: Patients continuum of care needs are met  06/30/2018 0546 by Governor Specking, RN  Outcome: Ongoing     Problem: Activity:  Goal: Ability to tolerate increased activity will improve  Description: Ability to tolerate increased activity will improve  06/30/2018 0546 by Governor Specking, RN  Outcome: Ongoing     Problem: Coping:  Goal: Ability to identify strategies to decrease anxiety will improve  Description: Ability to identify strategies to decrease anxiety will improve  06/30/2018 0546 by Governor Specking, RN  Outcome: Ongoing     Problem: Fluid Volume:  Goal: Will maintain adequate fluid volume  Description: Will maintain adequate fluid volume  06/30/2018 0546 by Governor Specking, RN  Outcome: Ongoing     Problem: Sensory:  Goal: Pain level will decrease  Description: Pain level will decrease  06/30/2018 0546 by Governor Specking, RN  Outcome: Ongoing     Problem:  Skin Integrity:  Goal: Skin integrity will be maintained  Description: Skin integrity will be maintained  06/30/2018 0546 by Governor Specking, RN  Outcome: Ongoing     Problem: DISCHARGE BARRIERS  Goal: Patient's continuum of care needs are met  06/30/2018 0546 by Governor Specking, RN  Outcome: Ongoing     Problem: IP BALANCE  Goal: LTG - Patient will maintain balance to allow for safe/functional mobility  06/30/2018 0546 by Governor Specking, RN  Outcome: Ongoing

## 2018-06-30 NOTE — Discharge Instructions (Addendum)
???   Good nutrition is important when healing from an illness, injury, or surgery.  Follow any nutrition recommendations given to you during your hospital stay.   ??? If you were given an oral nutrition supplement while in the hospital, continue to take this supplement at home.  You can take it with meals, in-between meals, and/or before bedtime. These supplements can be purchased at most local grocery stores, pharmacies, and chain super-stores.   ??? If you have any questions about your diet or nutrition, call the hospital and ask for the dietitian.    Diet as tolerated

## 2018-07-02 NOTE — Telephone Encounter (Signed)
[facility] follow up/dod 6-20/diverticulitis

## 2018-07-02 NOTE — Telephone Encounter (Signed)
[facility] Initial Follow Up Call    Outreach made within 2 business days of discharge: Yes    Patient: Katie Juarez Patient DOB: May 22, 1940   MRN: [id number]  Reason for Admission: diverticulitis  Discharge Date: 06/30/18       Spoke with: [name]    Discharge department/facility: [facility]    TCM Interactive Patient Contact:  Was patient able to fill all prescriptions: Yes  Was patient instructed to bring all medications to the follow-up visit: Yes  Is patient taking all medications as directed in the discharge summary? Yes  Does patient understand their discharge instructions: Yes  Does patient have questions or concerns that need addressed prior to 7-14 day follow up office visit: no    Scheduled appointment with PCP within 7-14 days    Follow Up  Future Appointments   Date Time Provider [place]   07/04/2018  9:20 AM Ladd, DO [facility]   07/06/2018  2:30 PM Monna Fam, MD [facility]   07/27/2018  3:30 PM Abagail Kitchens, MD [facility]   08/01/2018  8:40 AM Orlean Bradford, MD [facility]       [place] Siren, RN

## 2018-07-04 ENCOUNTER — Inpatient Hospital Stay: Admit: 2018-07-04 | Payer: MEDICARE | Primary: Family Medicine

## 2018-07-04 ENCOUNTER — Ambulatory Visit: Admit: 2018-07-04 | Discharge: 2018-07-04 | Payer: MEDICARE | Attending: Family Medicine | Primary: Family Medicine

## 2018-07-04 DIAGNOSIS — K5792 Diverticulitis of intestine, part unspecified, without perforation or abscess without bleeding: Secondary | ICD-10-CM

## 2018-07-04 LAB — BASIC METABOLIC PANEL
Anion Gap: 12 mmol/L (ref 9–17)
BUN: 20 mg/dL (ref 8–23)
Bun/Cre Ratio: 25 — ABNORMAL HIGH (ref 9–20)
CO2: 25 mmol/L (ref 20–31)
Calcium: 9.2 mg/dL (ref 8.6–10.4)
Chloride: 106 mmol/L (ref 98–107)
Creatinine: 0.79 mg/dL (ref 0.50–0.90)
GFR African American: >60 mL/min (ref >60)
GFR Non-African American: >60 mL/min (ref >60)
Glucose: 136 mg/dL — ABNORMAL HIGH (ref 70–99)
Potassium: 4.5 mmol/L (ref 3.7–5.3)
Sodium: 143 mmol/L (ref 135–144)

## 2018-07-04 LAB — CBC WITH AUTO DIFFERENTIAL
Absolute Eos #: 0.2 10*3/uL (ref 0.00–0.44)
Absolute Immature Granulocyte: 0.19 10*3/uL (ref 0.00–0.30)
Absolute Lymph #: 1.7 10*3/uL (ref 1.10–3.70)
Absolute Mono #: 1.06 10*3/uL (ref 0.10–1.20)
Basophils Absolute: 0.11 10*3/uL (ref 0.00–0.20)
Basophils: 1 % (ref 0–2)
Eosinophils %: 2 % (ref 1–4)
Hematocrit: 43.4 % (ref 36.3–47.1)
Hemoglobin: 13.9 g/dL (ref 11.9–15.1)
Immature Granulocytes: 2 % — ABNORMAL HIGH
Lymphocytes: 17 % — ABNORMAL LOW (ref 24–43)
MCH: 30.3 pg (ref 25.2–33.5)
MCHC: 32 g/dL (ref 25.2–33.5)
MCV: 94.8 fL (ref 82.6–102.9)
MPV: 10.1 fL (ref 8.1–13.5)
Monocytes: 10 % (ref 3–12)
NRBC Automated: 0 per 100 WBC
Platelets: 238 10*3/uL (ref 138–453)
RBC: 4.58 m/uL (ref 3.95–5.11)
RDW: 13.5 % (ref 11.8–14.4)
Seg Neutrophils: 68 % — ABNORMAL HIGH (ref 36–65)
Segs Absolute: 6.99 10*3/uL (ref 1.50–8.10)
WBC: 10.3 10*3/uL (ref 3.5–11.3)

## 2018-07-04 LAB — HEMOGLOBIN A1C
Estimated Avg Glucose: 131 mg/dL
Hemoglobin A1C: 6.2 % — ABNORMAL HIGH (ref 4.8–5.9)

## 2018-07-04 LAB — POCT GLUCOSE: Glucose: 89 mg/dL

## 2018-07-04 NOTE — Progress Notes (Signed)
Post-Discharge Transitional Care Management Services or Hospital Follow Up      [place]   Date of Birth:  06-20-1940    Date of Office Visit:  07/04/2018  Date of Hospital Admission: 06/28/18  Date of Hospital Discharge: 06/30/18  Readmission Risk Score(high >=14%. Medium >=10%):Readmission Risk Score: 9      Care management risk score Rising risk (score 2-5) and Complex Care (Scores >=6): 1     Non face to face  following discharge, date last encounter closed (first attempt may have been earlier): 07/02/2018  1:46 PM 07/02/2018  1:46 PM    Call initiated 2 business days of discharge: Yes     Patient Active Problem List   Diagnosis   ??? Lumbar facet joint syndrome ([place])   ??? 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 Pain of both sacroiliac joints   ??? Piriformis syndrome of left side   ??? Lumbar radicular pain   ??? Neural foraminal stenosis of lumbar spine   ??? Chronic low back pain   ??? Spinal stenosis, lumbar   ??? Diverticulitis       Allergies   Allergen Reactions   ??? Morphine Other (See Comments)     Night sweats,felt "loopy".   ??? Pcn [Penicillins] Hives   ??? Zithromax [Azithromycin] Hives       Medications listed as ordered at the time of discharge from hospital   [place] [place] Medication Instructions DPO:[id number]    Printed on:07/15/18 2324   Medication Information                      albuterol sulfate HFA (VENTOLIN HFA) 108 (90 Base) MCG/ACT inhaler  Ventolin HFA 90 mcg/actuation aerosol inhaler   Inhale 2 puffs every 6 hours by inhalation route as needed.   ASTHMA             atorvastatin (LIPITOR) 20 MG tablet  Take 1 tablet by mouth daily             bethanechol (URECHOLINE) 25 MG tablet  Take 1 tablet by mouth 4 times daily             budesonide-formoterol (SYMBICORT) 160-4.5 MCG/ACT AERO  Inhale 2 puffs into the lungs 2 times daily Rinse mouth or brush teeth after each use.             ciprofloxacin (CIPRO) 500 MG  tablet  Take 1 tablet by mouth 2 times daily             citalopram (CELEXA) 20 MG tablet  Take 1 tablet by mouth daily             docusate sodium (COLACE) 100 MG capsule  Take 100 mg by mouth nightly              fluticasone (FLONASE) 50 MCG/ACT nasal spray  1 spray by Nasal route daily             LORazepam (ATIVAN) 0.5 MG tablet  TAKE 1 TABLET BY MOUTH NIGHTLY AS NEEDED FOR ANXIETY             Multiple Vitamins-Minerals (THRIVE FOR LIFE WOMENS) TABS  Take by mouth             polyvinyl alcohol-povidone (HYPOTEARS) 1.4-0.6 % ophthalmic solution  Place 1-2 drops into both eyes as needed.  Medications marked "taking" at this time  Outpatient Medications Marked as Taking for the 07/04/18 encounter (Office Visit) with [name], DO   Medication Sig Dispense Refill   ??? bethanechol (URECHOLINE) 25 MG tablet Take 1 tablet by mouth 4 times daily 30 tablet 0   ??? ciprofloxacin (CIPRO) 500 MG tablet Take 1 tablet by mouth 2 times daily 20 tablet 0   ??? [EXPIRED] metroNIDAZOLE (FLAGYL) 500 MG tablet Take 1 tablet by mouth 3 times daily for 10 days 30 tablet 0   ??? citalopram (CELEXA) 20 MG tablet Take 1 tablet by mouth daily 90 tablet 1   ??? LORazepam (ATIVAN) 0.5 MG tablet TAKE 1 TABLET BY MOUTH NIGHTLY AS NEEDED FOR ANXIETY 90 tablet 0   ??? budesonide-formoterol (SYMBICORT) 160-4.5 MCG/ACT AERO Inhale 2 puffs into the lungs 2 times daily Rinse mouth or brush teeth after each use. 3 Inhaler 3   ??? atorvastatin (LIPITOR) 20 MG tablet Take 1 tablet by mouth daily 90 tablet 1   ??? fluticasone (FLONASE) 50 MCG/ACT nasal spray 1 spray by Nasal route daily 1 Bottle 0   ??? Multiple Vitamins-Minerals (THRIVE FOR LIFE WOMENS) TABS Take by mouth     ??? albuterol sulfate HFA (VENTOLIN HFA) 108 (90 Base) MCG/ACT inhaler Ventolin HFA 90 mcg/actuation aerosol inhaler   Inhale 2 puffs every 6 hours by inhalation route as needed.   ASTHMA     ??? docusate sodium (COLACE) 100 MG capsule Take 100 mg by mouth nightly      ???  polyvinyl alcohol-povidone (HYPOTEARS) 1.4-0.6 % ophthalmic solution Place 1-2 drops into both eyes as needed.          Medications patient taking as of now reconciled against medications ordered at time of hospital discharge: Yes    Chief Complaint   Patient presents with   ??? Follow-Up from [facility]     [facility] June 18-20 / diverticulitis       HPI    Pt here today for transitional care visit - was admitted to [facility] from 06/28/18 - 06/30/18.      Had been seen in UC on 6/15 - CT showed "uncomplicated diverticulitis", and she was started on Cipro and Flagyl.  She continued to worse, so returned to UC on 6/18, and she had mild progression noted on CT scan of sigmoid diverticulitis with questionable developing abscess.  Admitted on 6/18 and started on IV antibiotics.  Discharged home to complete oral courses of Cipro/Flagyl.    Has had decreased appetite, but has been trying to eat.  Has some Boost at home that she is trying to drink if she's hungry to try to avoid as much solid foods.     Will be seeing Dr. Blanca Friend for f/u on 6/26.   Still taking Colace 100 mg nightly - bowels are moving well, but are watery.  No abdominal pain/cramping or bloating.  No N/V.    When she was taking a shower at home yesterday, she felt off-balance; had to hold onto a grab bar in her shower, or she felt she might fall.  Sometimes feels "dizzy" when walking; feels her eyes are out of focus.  Did not feel well enough to drive.    Urinating well since being home; no signs of UTI.  Has been taking Bethanechol 25 mg QID - has been going often overnight (up to 5 times).  Has appt set up for 7/22 with Dr. Barrett Shell for follow-up, but can cancel if feeling better  by then.      Has been having chills/sweats overnight; no fever.    Taking Ativan as needed at home - taking 1 at bedtime.      Inpatient course: Discharge summary reviewed- see chart.    Interval history/Current status: improved    Review of Systems   Constitutional: Positive for fatigue.  Negative for fever.   Gastrointestinal: Positive for diarrhea (mild). Negative for abdominal pain (much improved) and vomiting.       Vitals:    07/04/18 1024   BP: 124/80   Site: Right Upper Arm   Position: Sitting   Cuff Size: Medium Adult   Pulse: 60   Temp: 97.3 ??F (36.3 ??C)   TempSrc: Infrared   Weight: 147 lb (66.7 kg)   Height: 5\' 4"  (1.626 m)     Body mass index is 25.23 kg/m??.   Wt Readings from Last 3 Encounters:   07/06/18 147 lb (66.7 kg)   07/04/18 147 lb (66.7 kg)   06/30/18 147 lb 4.8 oz (66.8 kg)     BP Readings from Last 3 Encounters:   07/06/18 110/70   07/04/18 124/80   06/30/18 (!) 98/58       Physical Exam  Vitals signs and nursing note reviewed.   Constitutional:       General: She is not in acute distress.     Appearance: She is well-developed.   HENT:      Head: Normocephalic and atraumatic.      Right Ear: Tympanic membrane, ear canal and external ear normal.      Left Ear: Tympanic membrane, ear canal and external ear normal.      Nose: Nose normal.      Mouth/Throat:      Mouth: Mucous membranes are moist.      Pharynx: Oropharynx is clear. No posterior oropharyngeal erythema.   Eyes:      Conjunctiva/sclera: Conjunctivae normal.   Neck:      Musculoskeletal: Neck supple.   Cardiovascular:      Rate and Rhythm: Normal rate and regular rhythm.      Heart sounds: Normal heart sounds.   Pulmonary:      Effort: Pulmonary effort is normal. No respiratory distress.      Breath sounds: Normal breath sounds.   Abdominal:      General: Bowel sounds are normal. There is no distension.      Palpations: Abdomen is soft.      Tenderness: There is no abdominal tenderness.   Skin:     General: Skin is warm and dry.   Neurological:      Mental Status: She is alert and oriented to person, place, and time.             Assessment/Plan:  1. Diverticulitis  - PR DISCHARGE MEDS RECONCILED W/ CURRENT OUTPATIENT MED LIST  - Basic Metabolic Panel; Future    2. Lightheaded  - POCT Glucose  - CBC Auto Differential;  Future  - Basic Metabolic Panel; Future    3. Screening for diabetes mellitus    4. Family history of diabetes mellitus  - Hemoglobin A1C; Future        Medical Decision Making: moderate complexity

## 2018-07-06 ENCOUNTER — Inpatient Hospital Stay: Admit: 2018-07-06 | Payer: MEDICARE | Primary: Family Medicine

## 2018-07-06 ENCOUNTER — Ambulatory Visit: Admit: 2018-07-06 | Discharge: 2018-07-06 | Payer: MEDICARE | Attending: Surgery | Primary: Family Medicine

## 2018-07-06 DIAGNOSIS — L659 Nonscarring hair loss, unspecified: Secondary | ICD-10-CM

## 2018-07-06 LAB — TSH WITH REFLEX: TSH: 0.83 mIU/L (ref 0.30–5.00)

## 2018-07-06 NOTE — Telephone Encounter (Signed)
Addressed this under lab results

## 2018-07-06 NOTE — Telephone Encounter (Signed)
Pt calling for lab results, pt also questions about having her thyroid checked as she's been losing some hair, pt didn't get a chance to mention this at her visit, pt also questions when she is due to come back in to be seen, please advise pt at above number.

## 2018-07-06 NOTE — Progress Notes (Signed)
Patient here for a follow-up on her recent hospitalization for acute diverticulitis.  Other than some loose stools she is actually feeling pretty good.  Turns out though she cut down the dose of her antibiotic though she has not completed it yet.    She also complains of fatigue and hair loss and ask about getting her thyroid checked    BP 110/70    Pulse 68    Temp 97.6 ??F (36.4 ??C) (Tympanic)    Resp 16    Ht 5\' 4"  (1.626 m)    Wt 147 lb (66.7 kg)    LMP  (LMP Unknown)    SpO2 95%    BMI 25.23 kg/m??     IMP/PLAN  1) Diverticulitis - had a colonoscopy in [place] 2 years ago.  We are trying to obtain those reports  2) She also has surgery in [place] a few year ago, supposedly a colectomy.  We are trying to obtain operative report and pathology.  3) Will check her Thyroid  4) Labs looked good.  5) Encouraged her to take all of her medications as prescribed.  If she has a problem, she needs to contact her doctor.  Cutting back on antibiotics could put her at risk for developing an antibiotic resistant infection.

## 2018-07-27 ENCOUNTER — Ambulatory Visit: Admit: 2018-07-27 | Payer: MEDICARE | Attending: Dermatology | Primary: Family Medicine

## 2018-07-27 DIAGNOSIS — D485 Neoplasm of uncertain behavior of skin: Secondary | ICD-10-CM

## 2018-07-27 NOTE — Patient Instructions (Signed)
BIOPSY WOUND CARE    A biopsy is where a small piece of skin tissue is removed and examined by a pathologist.  When a biopsy is done, there is a small wound site that requires proper care to prevent infection and scarring.  Some biopsies require sutures and their removal.    How to Care for Biopsy Wound    A.  Leave band-aid or dressing on for 24 hours.  B.  Wash two times a day with soap and water.  C.  Let the wound air dry, then apply Vaseline ointment and cover with a Band-Aid       unless otherwise instructed by your provider.   D.  If there is slight discomfort, you may give acetaminophen or ibuprofen.    Bleeding:  Every effort is made to stop bleeding prior to you leaving the dermatology clinic. Unfortunately, people will occasionally start to bleed after leaving the clinic. If you start to bleed hold firm pressure for 15 minutes to the area. If you are on blood thinners I recommend keeping a product called wound seal (can buy at any drug store) at home as this can be a useful adjunct to stop bleeding. If after holding pressure for 15 minutes and/or applying wound seal the bleeding does not stop please call the dermatology clinic.    When To Call the Doctor    Call the Dermatology Clinic or your doctor if any of the following occur:    A.  Redness and swelling  B.  Tenderness and warm to touch  C.  Drainage from wound  D.  Fever    Biopsy Results    Biopsy results are usually available in 1-2 weeks.  We provide biopsy results in letters for begin results or we will call for any concerning results.  If you have not heard from our staff please call the office within 2 weeks. Please call our office with any concerns at [phone].    Cryotherapy    Liquid Nitrogen - "freeze" (Cryotherapy)  Your doctor has treated your skin lesions with a very cold substance.  The liquid nitrogen is so cold that it may feel like the skin is burning during application.  A clear blister or blood blister may form after treatment  and may later form a scab.  Leave the area alone.  Usually this scab will fall of within 1-2 weeks.  The area should be kept clean and can be covered with Vaseline or an antibiotic ointment (such as polysporin) and a Band-Aid if needed. If a large blister develops it is ok to use a clean needle to gently pop the blister. Please call our office with any concerns at [phone].

## 2018-07-29 NOTE — Progress Notes (Signed)
Dermatology Patient Note  Katie Ju[facility]  [address]  [place] [address]  Dept: [phone]  Dept Fax: [phone]  Loc: [phone]      VISIT DATE: 07/27/2018   REFERRING PROVIDER: [name], DO      Katie Juarez is a 78 y.o. female  who presents today in the office for:    Skin Lesion (both sides of nose forehead and left arm )      HISTORY OF PRESENT ILLNESS:  Katie Juarez presents for a new lesion on the left nose x months, growing, catches and bleeds. Also has several other new facial lesions which do not bleed    CURRENT MEDICATIONS:   Current Outpatient Medications   Medication Sig Dispense Refill   ??? bethanechol (URECHOLINE) 25 MG tablet Take 1 tablet by mouth 4 times daily 30 tablet 0   ??? ciprofloxacin (CIPRO) 500 MG tablet Take 1 tablet by mouth 2 times daily 20 tablet 0   ??? citalopram (CELEXA) 20 MG tablet Take 1 tablet by mouth daily 90 tablet 1   ??? LORazepam (ATIVAN) 0.5 MG tablet TAKE 1 TABLET BY MOUTH NIGHTLY AS NEEDED FOR ANXIETY 90 tablet 0   ??? budesonide-formoterol (SYMBICORT) 160-4.5 MCG/ACT AERO Inhale 2 puffs into the lungs 2 times daily Rinse mouth or brush teeth after each use. 3 Inhaler 3   ??? atorvastatin (LIPITOR) 20 MG tablet Take 1 tablet by mouth daily 90 tablet 1   ??? Multiple Vitamins-Minerals (THRIVE FOR LIFE WOMENS) TABS Take by mouth     ??? albuterol sulfate HFA (VENTOLIN HFA) 108 (90 Base) MCG/ACT inhaler Ventolin HFA 90 mcg/actuation aerosol inhaler   Inhale 2 puffs every 6 hours by inhalation route as needed.   ASTHMA     ??? docusate sodium (COLACE) 100 MG capsule Take 100 mg by mouth nightly      ??? polyvinyl alcohol-povidone (HYPOTEARS) 1.4-0.6 % ophthalmic solution Place 1-2 drops into both eyes as needed.     ??? fluticasone (FLONASE) 50 MCG/ACT nasal spray 1 spray by Nasal route daily 1 Bottle 0     No current facility-administered medications for this visit.        ALLERGIES:    Allergies   Allergen Reactions   ??? Morphine Other (See Comments)     Night sweats,felt "loopy".   ??? Pcn [Penicillins] Hives   ??? Zithromax [Azithromycin] Hives       SOCIAL HISTORY:  Social History     Tobacco Use   ??? Smoking status: Former Smoker     Packs/day: 1.00     Years: 40.00     Pack years: 40.00     Start date: 01/10/1953     Last attempt to quit: 01/11/1988     Years since quitting: 30.5   ??? Smokeless tobacco: Never Used   ??? Tobacco comment: quit 25 years ago 1990   Substance Use Topics   ??? Alcohol use: No     Alcohol/week: 0.0 standard drinks       REVIEW OF SYSTEMS:  Review of Systems   Constitutional: Negative.      Skin:Denies any new changing, growing or bleeding lesions or rashes except as described in the HPI     PHYSICAL EXAM:   BP 130/72    Pulse 68    Ht 5\' 4"  (1.626 m)    Wt 148 lb (67.1 kg)  LMP  (LMP Unknown)    BMI 25.40 kg/m??     General Exam:  General Appearance: No acute distress, Well nourished     Neuro: Alert and oriented to person, place and time  Psych: Normal affect   Lymph Node: Not performed    Cutaneous Exam: Performed as documented in clinic note below.  Focal exam of face performed    Pertinent Physical Exam Findings:  Physical Exam  Skin:               Medical Necessity of Exam Performed:   Distribution of patient concerns    Additional Diagnostic Testing performed during exam: Not performed ,  Not performed    ASSESSMENT:   Diagnosis Orders   1. Neoplasm of uncertain behavior of skin  PR TANGENTIAL BIOPSY SKIN SINGLE LESION    PR TANGENTIAL BIOPSY SKIN EA SEP/ADDITIONAL LESION       Plan of Action is as Follows:  Assessment 1. Neoplasm of uncertain behavior of skin x 3 r/o NMSC  Shave Biopsy: After verbal consent including the risks of bleeding infection and scar and cleaning with alcohol the lesions on the right cheek, left nose and left upper lip was anesthetized with (LMX AND/OR 1% lidocaine with epinephrine) and was removed with a dermablade. Hemostasis was achieved with  aluminum chloride and Vaseline and a bandage were applied.  - PR TANGENTIAL BIOPSY SKIN SINGLE LESION  - PR TANGENTIAL BIOPSY SKIN EA SEP/ADDITIONAL LESION          Patient Instructions   BIOPSY WOUND CARE    A biopsy is where a small piece of skin tissue is removed and examined by a pathologist.  When a biopsy is done, there is a small wound site that requires proper care to prevent infection and scarring.  Some biopsies require sutures and their removal.    How to Care for Biopsy Wound    A.  Leave band-aid or dressing on for 24 hours.  B.  Wash two times a day with soap and water.  C.  Let the wound air dry, then apply Vaseline ointment and cover with a Band-Aid       unless otherwise instructed by your provider.   D.  If there is slight discomfort, you may give acetaminophen or ibuprofen.    Bleeding:  Every effort is made to stop bleeding prior to you leaving the dermatology clinic. Unfortunately, people will occasionally start to bleed after leaving the clinic. If you start to bleed hold firm pressure for 15 minutes to the area. If you are on blood thinners I recommend keeping a product called wound seal (can buy at any drug store) at home as this can be a useful adjunct to stop bleeding. If after holding pressure for 15 minutes and/or applying wound seal the bleeding does not stop please call the dermatology clinic.    When To Call the Doctor    Call the Dermatology Clinic or your doctor if any of the following occur:    A.  Redness and swelling  B.  Tenderness and warm to touch  C.  Drainage from wound  D.  Fever    Biopsy Results    Biopsy results are usually available in 1-2 weeks.  We provide biopsy results in letters for begin results or we will call for any concerning results.  If you have not heard from our staff please call the office within 2 weeks. Please call our office with any concerns at  [phone].    Cryotherapy    Liquid Nitrogen - "freeze" (Cryotherapy)  Your doctor has treated your skin  lesions with a very cold substance.  The liquid nitrogen is so cold that it may feel like the skin is burning during application.  A clear blister or blood blister may form after treatment and may later form a scab.  Leave the area alone.  Usually this scab will fall of within 1-2 weeks.  The area should be kept clean and can be covered with Vaseline or an antibiotic ointment (such as polysporin) and a Band-Aid if needed. If a large blister develops it is ok to use a clean needle to gently pop the blister. Please call our office with any concerns at [phone].      Photo surveillance performed: Yes    This note was created with the assistance of aspeech-recognition program.?? Although the intention is to generate a document that actually reflects thecontent of the visit, no guarantees can be provided that every mistake has been identified and corrected by editing.    Electronically signed by Abagail Kitchens, MD on 07/29/18 at 5:56 PM EDT

## 2018-08-01 ENCOUNTER — Inpatient Hospital Stay: Payer: MEDICARE | Primary: Family Medicine

## 2018-08-01 ENCOUNTER — Ambulatory Visit: Admit: 2018-08-01 | Discharge: 2018-08-01 | Payer: MEDICARE | Attending: Urology | Primary: Family Medicine

## 2018-08-01 ENCOUNTER — Telehealth

## 2018-08-01 DIAGNOSIS — N390 Urinary tract infection, site not specified: Secondary | ICD-10-CM

## 2018-08-01 LAB — DERMATOLOGY PATHOLOGY

## 2018-08-01 NOTE — Telephone Encounter (Signed)
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 I have referred to Dr. Yehuda Savannah in [place] (surgeon) to remove both. The lesion on the right cheek was benign. Please also fax referral     Thanks,    Trena Platt [facility]

## 2018-08-01 NOTE — Telephone Encounter (Signed)
Called and informed patient of results. Informed patient info will be faxed and Dr. Abbey Chatters office will contact her for Moh's procedure. Pt verbalized understanding.

## 2018-08-01 NOTE — Progress Notes (Signed)
Post void residual by bladder scanner 0cc.

## 2018-08-01 NOTE — Progress Notes (Addendum)
Rosemarie Ax, MD  Urology Clinic Consultation / New Patient Visit    Patient:  Katie Juarez  Date of Birth: 05-20-1940  Date: 08/01/2018    HISTORY OF PRESENT ILLNESS:   The patient is a 78 y.o. female who presents today for evaluation of the following problems:      1. Urinary retention    2. Renal cyst    3. Recurrent UTI         Overall the problem(s) : are worsening.  Associated Symptoms: No dysuria, gross hematuria.  Pain Severity: Pain Score:   0 - No pain    Summary of old records: previously had bladder lifted. in PCU recently was given bethanecol  (Patient's old records, notes and chart reviewed and summarized above.)      Onset years  Just recently started bethanecol while in hospital  Severity is described as moderate.   The course of symptoms over time is chronic and intermittent.  Alleviating factors: stool softeners, bethanecol  Worsening factors: constipation  Lower urinary tract symptoms: at baseline none, but last couple days has some slow stream. Has some UUI, SUI  Wears 1-2 pad a day- mild bothersome  Feels like has pressure in pelvis now and start of UTI    I independently reviewed and verified the images and reports from:    CT 06/28/2018  1. Sigmoid diverticulitis with mild progression. ??Increased colonic wall    thickening and pericolonic inflammatory changes with questionable developing    intramural abscess. ??No evidence of perforation.    2. No other acute findings within the abdomen or pelvis.      1.9 cm left renal cyst      Urinalysis today:  No results found for this visit on 08/01/18.    Last BUN and creatinine:  Lab Results   Component Value Date    BUN 20 07/04/2018     Lab Results   Component Value Date    CREATININE 0.79 07/04/2018       Imaging Reviewed during this Office Visit:   (results were independently reviewed by physician and radiology report verified)    PAST MEDICAL, FAMILY AND SOCIAL HISTORY:  Past Medical History:   Diagnosis Date   ??? Breast cancer ([place]) 1999     in remission   ??? DDD (degenerative disc disease)    ??? Large bowel obstruction ([place]) 02/2015    [facility]   ??? LBP (low back pain)    ??? SS (spinal stenosis)      Past Surgical History:   Procedure Laterality Date   ??? APPENDECTOMY  1956   ??? BLADDER SUSPENSION  1990   ??? BREAST LUMPECTOMY Right 1999   ??? CHOLECYSTECTOMY  1990   ??? FEMORAL HERNIA REPAIR Left 02/20/2015    strangulated with small bowel resection - [facility],  [facility] [place], [place]   ??? [place] for endometriosis; done in [place]   ??? OTHER SURGICAL HISTORY  11/14/13, 06/23/11, 06/30/11, 07/12/11 12/29/11    L4/L5 IESI   ??? OTHER SURGICAL HISTORY  12/17/13    L5/S1 IESI   ??? OTHER SURGICAL HISTORY Bilateral 05/30/14    SIJ and piriformis   ??? OTHER SURGICAL HISTORY Bilateral 09/16/2014    L3, L4, L5 Diagnostic Medial Branch Block   ??? OTHER SURGICAL HISTORY Bilateral 09/26/2014    L5 TFE   ??? OTHER SURGICAL HISTORY  10/10/2014    bil L5 TFE   ???  OTHER SURGICAL HISTORY  10/31/2014    caudal   ??? SMALL INTESTINE SURGERY  02/20/2015    strangulated left femoral hernia   ??? TONSILLECTOMY  1946     Family History   Problem Relation Age of Onset   ??? Other Mother         ALS - diagnosed at age 8   ??? Stroke Father         age 42   ??? Cancer Sister         pt thinks it was liver   ??? Cancer Brother         leukemia   ??? Diabetes Maternal Grandfather    ??? Other Brother         drowned   ??? Other Brother         died at age 76; think due to spinal meningitis   ??? Lupus Sister    ??? Alzheimer's Disease Sister    ??? Dementia Sister      Outpatient Medications Marked as Taking for the 08/01/18 encounter (Office Visit) with Orlean Bradford, MD   Medication Sig Dispense Refill   ??? ciprofloxacin (CIPRO) 500 MG tablet Take 1 tablet by mouth 2 times daily 20 tablet 0   ??? citalopram (CELEXA) 20 MG tablet Take 1 tablet by mouth daily 90 tablet 1   ??? LORazepam (ATIVAN) 0.5 MG tablet TAKE 1 TABLET BY MOUTH NIGHTLY AS NEEDED FOR ANXIETY 90 tablet 0   ???  budesonide-formoterol (SYMBICORT) 160-4.5 MCG/ACT AERO Inhale 2 puffs into the lungs 2 times daily Rinse mouth or brush teeth after each use. 3 Inhaler 3   ??? atorvastatin (LIPITOR) 20 MG tablet Take 1 tablet by mouth daily 90 tablet 1   ??? Multiple Vitamins-Minerals (THRIVE FOR LIFE WOMENS) TABS Take by mouth     ??? albuterol sulfate HFA (VENTOLIN HFA) 108 (90 Base) MCG/ACT inhaler Ventolin HFA 90 mcg/actuation aerosol inhaler   Inhale 2 puffs every 6 hours by inhalation route as needed.   ASTHMA     ??? docusate sodium (COLACE) 100 MG capsule Take 100 mg by mouth nightly      ??? polyvinyl alcohol-povidone (HYPOTEARS) 1.4-0.6 % ophthalmic solution Place 1-2 drops into both eyes as needed.         Morphine; Pcn [penicillins]; Zithromax [azithromycin]; and Adhesive tape  Social History     Tobacco Use   Smoking Status Former Smoker   ??? Packs/day: 1.00   ??? Years: 40.00   ??? Pack years: 40.00   ??? Start date: 01/10/1953   ??? Last attempt to quit: 01/11/1988   ??? Years since quitting: 30.5   Smokeless Tobacco Never Used   Tobacco Comment    quit 25 years ago [place]     Substance and Sexual Activity   Alcohol Use No   ??? Alcohol/week: 0.0 standard drinks       REVIEW OF SYSTEMS:  Constitutional: negative  Eyes: negative  Respiratory: negative  Cardiovascular: negative  Gastrointestinal: negative  Genitourinary: negative except for what is in HPI  Musculoskeletal: negative  Skin: negative   Neurological: negative  Hematological/Lymphatic: negative  Psychological: negative    Physical Exam:    This a 79 y.o. female      Vitals:    08/01/18 0857   BP: 130/64   Pulse: 76   Temp: 97.6 ??F (36.4 ??C)   SpO2: 91%     Constitutional: Patient in  no acute distress.  Neuro: Alert and oriented to person, place and time.  Psych: mood and affect normal  HEENT negative  Lungs: Respiratory effort is normal  Cardiovascular: Normal peripheral pulses  Abdomen: Soft, non-tender, non-distended   Lymphatics: No palpable  lymphadenopathy.  Bladder non-tender and not distended.      Assessment and Plan      1. Urinary retention    2. Renal cyst    3. Recurrent UTI           Plan:       Stop Bethanecol  Recurrent UTIs: Discussed double voiding techniques to improve bladder emptying. Discussed staying well hydrated, >60 oz/day. Cranberry pills BID. Discussed good blood sugar control.   Constipation: miralax, colace  Check Ucx, PVR today 0cc  Return in about 4 weeks (around 08/29/2018) for PVR.             Rosemarie Ax, MD  [facility]

## 2018-08-01 NOTE — Addendum Note (Signed)
Addended by: Fontaine No on: 08/01/2018 09:28 AM     Modules accepted: Orders

## 2018-08-03 LAB — CULTURE, URINE

## 2018-08-03 MED ORDER — NITROFURANTOIN MONOHYD MACRO 100 MG PO CAPS
100 MG | ORAL_CAPSULE | Freq: Two times a day (BID) | ORAL | 0 refills | Status: DC
Start: 2018-08-03 — End: 2019-02-08

## 2018-08-03 NOTE — Telephone Encounter (Signed)
Received urine culture report.  Per verbal order Dr. Barrett Shell, patient to start Macrobid 100 mg by mouth twice a day for 10 days.  Prescription sent into [facility] per patient request.

## 2018-08-20 ENCOUNTER — Ambulatory Visit: Admit: 2018-08-20 | Discharge: 2018-08-20 | Payer: MEDICARE | Attending: Family Medicine | Primary: Family Medicine

## 2018-08-20 DIAGNOSIS — R238 Other skin changes: Secondary | ICD-10-CM

## 2018-08-20 MED ORDER — KETOCONAZOLE 2 % EX SHAM
2 % | CUTANEOUS | 1 refills | Status: DC
Start: 2018-08-20 — End: 2019-01-09

## 2018-08-20 MED ORDER — KETOCONAZOLE 2 % EX SHAM
2 % | CUTANEOUS | 1 refills | Status: DC
Start: 2018-08-20 — End: 2018-08-20

## 2018-08-20 MED ORDER — GUAIFENESIN ER 600 MG PO TB12
600 MG | ORAL_TABLET | Freq: Two times a day (BID) | ORAL | 0 refills | Status: DC
Start: 2018-08-20 — End: 2018-08-20

## 2018-08-20 MED ORDER — GUAIFENESIN ER 600 MG PO TB12
600 MG | ORAL_TABLET | Freq: Two times a day (BID) | ORAL | 0 refills | Status: AC
Start: 2018-08-20 — End: 2018-09-04

## 2018-08-20 NOTE — Progress Notes (Signed)
[place]  [address] [place] [address]  [id number]      Katie Juarez is a 78 y.o. female who presents today for her medical conditions/complaints as noted below.  Katie Juarez is c/o of Other (hairloss- ever since she had diverticulitis about 6 weeks ago) and Other (tightness- in her throat, if she drinks or eats something cold it makes it worse,  ( since back surgery about 3 years ago) trouble with her voice getting weaker as the evening comes)      HPI:     Pt here today for continued hair loss.    Worsening x past few weeks  Itchy scalp and feels crusting, mostly over occipital scalp\\  No loss of eyebrows or eyelashes    Has used several different shampoos/conditioners for "colored/treated hair"; will then use mousse to style.  Did try Head & Shoulders shampoo for awhile, but no difference.    One week ago, she saw Dr. 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  Taking Cipro BID x 10 days for lesions, and placing Vaseline on area once daily after cleaning.    Using Colace once nightly and Miralax as needed (maybe a couple times per week)  Using Metamucil daily  Urinating more easily  Stopped Bethanechol - no worsening of symptoms    Did have E coli on urine culture on 7/22 - was started on Macrobid 100 mg BID x 10 days.  Felt better after taking this.    Still has cough with sputum, usually worse in the evening and if she eats/drinks something cold, like cold water.  Starts to feel like her throat "tightens up", and will sometimes sneeze at the same time.  Will last 3 mins.  No longer using Zyrtec or nasal spray - cannot remember if these helped when she took them last fall/summer.  Sometimes wakes her from sleep.  No improvement with Omeprazole last year.  Had EGD a few years ago in [place] - was told they didn't see anything.    Admits that she is only using the Symbicort 1 puff BID - was unaware she  was supposed to be using 2 puffs BID.    Taking only Celexa 10 mg (1/2 of a 20 mg tab) at bedtime, along with Ativan 0.5 mg nightly.  If she takes a whole Celexa, she will be more tired the next day.            Past Medical History:   Diagnosis Date   ??? Breast cancer ([place]) 1999    in remission   ??? DDD (degenerative disc disease)    ??? Large bowel obstruction ([place]) 02/2015    [facility]   ??? LBP (low back pain)    ??? SS (spinal stenosis)       Past Surgical History:   Procedure Laterality Date   ??? APPENDECTOMY  1956   ??? BLADDER SUSPENSION  1990   ??? BREAST LUMPECTOMY Right 1999   ??? CHOLECYSTECTOMY  1990   ??? FEMORAL HERNIA REPAIR Left 02/20/2015    strangulated with small bowel resection - [facility],  [facility], [place]   ??? [place] for endometriosis; done in [place]   ??? OTHER SURGICAL HISTORY  11/14/13, 06/23/11, 06/30/11, 07/12/11 12/29/11    L4/L5 IESI   ??? OTHER SURGICAL HISTORY  12/17/13  L5/S1 IESI   ??? OTHER SURGICAL HISTORY Bilateral 05/30/14    SIJ and piriformis   ??? OTHER SURGICAL HISTORY Bilateral 09/16/2014    L3, L4, L5 Diagnostic Medial Branch Block   ??? OTHER SURGICAL HISTORY Bilateral 09/26/2014    L5 TFE   ??? OTHER SURGICAL HISTORY  10/10/2014    bil L5 TFE   ??? OTHER SURGICAL HISTORY  10/31/2014    caudal   ??? SMALL INTESTINE SURGERY  02/20/2015    strangulated left femoral hernia   ??? TONSILLECTOMY  1946     Family History   Problem Relation Age of Onset   ??? Other Mother         ALS - diagnosed at age 9   ??? Stroke Father         age 94   ??? Cancer Sister         pt thinks it was liver   ??? Cancer Brother         leukemia   ??? Diabetes Maternal Grandfather    ??? Other Brother         drowned   ??? Other Brother         died at age 59; think due to spinal meningitis   ??? Lupus Sister    ??? Alzheimer's Disease Sister    ??? Dementia Sister      Social History     Tobacco Use   ??? Smoking status: Former Smoker     Packs/day: 1.00     Years: 40.00     Pack years: 40.00     Start date:  01/10/1953     Last attempt to quit: 01/11/1988     Years since quitting: 30.6   ??? Smokeless tobacco: Never Used   ??? Tobacco comment: quit 25 years ago 1990   Substance Use Topics   ??? Alcohol use: No     Alcohol/week: 0.0 standard drinks      Current Outpatient Medications   Medication Sig Dispense Refill   ??? Lactobacillus (PROBIOTIC ACIDOPHILUS PO) Take by mouth     ??? Multiple Vitamins-Minerals (THERAPEUTIC MULTIVITAMIN-MINERALS) tablet Take 1 tablet by mouth daily     ??? guaiFENesin (MUCINEX) 600 MG extended release tablet Take 1 tablet by mouth 2 times daily for 15 days (Patient not taking: Reported on 08/25/2018) 30 tablet 0   ??? ketoconazole (NIZORAL) 2 % shampoo Use to shampoo hair 3x's weekly for 4 weeks. Keep lather on for 10 minutes before rinsing. 1 Bottle 1   ??? ciprofloxacin (CIPRO) 500 MG tablet Take 1 tablet by mouth 2 times daily (Patient taking differently: Take 250 mg by mouth 2 times daily ) 20 tablet 0   ??? citalopram (CELEXA) 20 MG tablet Take 1 tablet by mouth daily 90 tablet 1   ??? LORazepam (ATIVAN) 0.5 MG tablet TAKE 1 TABLET BY MOUTH NIGHTLY AS NEEDED FOR ANXIETY 90 tablet 0   ??? budesonide-formoterol (SYMBICORT) 160-4.5 MCG/ACT AERO Inhale 2 puffs into the lungs 2 times daily Rinse mouth or brush teeth after each use. 3 Inhaler 3   ??? atorvastatin (LIPITOR) 20 MG tablet Take 1 tablet by mouth daily 90 tablet 1   ??? albuterol sulfate HFA (VENTOLIN HFA) 108 (90 Base) MCG/ACT inhaler Ventolin HFA 90 mcg/actuation aerosol inhaler   Inhale 2 puffs every 6 hours by inhalation route as needed.   ASTHMA     ??? docusate sodium (COLACE) 100 MG capsule Take 100 mg by mouth nightly      ???  polyvinyl alcohol-povidone (HYPOTEARS) 1.4-0.6 % ophthalmic solution Place 1-2 drops into both eyes as needed.     ??? nitrofurantoin, macrocrystal-monohydrate, (MACROBID) 100 MG capsule Take 1 capsule by mouth 2 times daily for 10 days 20 capsule 0   ??? fluticasone (FLONASE) 50 MCG/ACT nasal spray 1 spray by Nasal route daily  (Patient not taking: Reported on 08/20/2018) 1 Bottle 0     No current facility-administered medications for this visit.      Allergies   Allergen Reactions   ??? Morphine Other (See Comments)     Night sweats,felt "loopy".   ??? Pcn [Penicillins] Hives   ??? Zithromax [Azithromycin] Hives   ??? Adhesive Tape Rash       Health Maintenance   Topic Date Due   ??? Shingles Vaccine (1 of 2) 06/08/1989   ??? Annual Wellness Visit (AWV)  09/14/2018   ??? Flu vaccine (1) 09/11/2018   ??? Lipid screen  09/28/2018   ??? DTaP/Tdap/Td vaccine (2 - Td) 09/28/2027   ??? DEXA (modify frequency per FRAX score)  Completed   ??? Pneumococcal 65+ years Vaccine  Completed   ??? Hepatitis A vaccine  Aged Out   ??? Hepatitis B vaccine  Aged Out   ??? Hib vaccine  Aged Out   ??? Meningococcal (ACWY) vaccine  Aged Out       Subjective:      Review of Systems   Respiratory: Positive for cough. Negative for shortness of breath.        Objective:     Vitals:    08/20/18 1400   BP: 110/70   Pulse: 61   Temp: 95.5 ??F (35.3 ??C)   TempSrc: Infrared   SpO2: 93%   Weight: 147 lb 8 oz (66.9 kg)   Height: 5\' 5"  (1.651 m)     Physical Exam  Vitals signs and nursing note reviewed.   Constitutional:       General: She is not in acute distress.     Appearance: She is well-developed.   HENT:      Head: Normocephalic and atraumatic.      Right Ear: Tympanic membrane, ear canal and external ear normal.      Left Ear: Tympanic membrane, ear canal and external ear normal.      Nose: Nose normal.      Mouth/Throat:      Mouth: Mucous membranes are moist.      Pharynx: Oropharynx is clear. No posterior oropharyngeal erythema.   Eyes:      Conjunctiva/sclera: Conjunctivae normal.   Neck:      Musculoskeletal: Neck supple.   Cardiovascular:      Rate and Rhythm: Normal rate and regular rhythm.      Heart sounds: Normal heart sounds.   Pulmonary:      Effort: Pulmonary effort is normal. No respiratory distress.      Breath sounds: Normal breath sounds.   Abdominal:      General: Bowel sounds  are normal. There is no distension.      Palpations: Abdomen is soft.      Tenderness: There is no abdominal tenderness.   Skin:     General: Skin is warm and dry.   Neurological:      Mental Status: She is alert and oriented to person, place, and time.         Assessment:       Diagnosis Orders   1. Scalp irritation  ketoconazole (NIZORAL) 2 % shampoo  DISCONTINUED: ketoconazole (NIZORAL) 2 % shampoo   2. Productive cough  guaiFENesin (MUCINEX) 600 MG extended release tablet    DISCONTINUED: guaiFENesin (MUCINEX) 600 MG extended release tablet         Plan:      Return in about 2 months (around 10/20/2018) for MAW visit and follow-up hair loss, cough, Follow-up.    No orders of the defined types were placed in this encounter.    Orders Placed This Encounter   Medications   ??? DISCONTD: ketoconazole (NIZORAL) 2 % shampoo     Sig: Use to shampoo hair 3x's weekly for 4 weeks. Keep lather on for 10 minutes before rinsing.     Dispense:  1 Bottle     Refill:  1   ??? DISCONTD: guaiFENesin (MUCINEX) 600 MG extended release tablet     Sig: Take 1 tablet by mouth 2 times daily for 15 days     Dispense:  30 tablet     Refill:  0   ??? guaiFENesin (MUCINEX) 600 MG extended release tablet     Sig: Take 1 tablet by mouth 2 times daily for 15 days     Dispense:  30 tablet     Refill:  0   ??? ketoconazole (NIZORAL) 2 % shampoo     Sig: Use to shampoo hair 3x's weekly for 4 weeks. Keep lather on for 10 minutes before rinsing.     Dispense:  1 Bottle     Refill:  1       Patient given educational materials - see patient instructions.  Discussed use, benefit, and side effects of prescribed medications.  All patient questions answered.  Pt voiced understanding.  Reviewed health maintenance.            Electronically signed by [name], DO on 08/26/2018 at 11:53 PM

## 2018-08-24 ENCOUNTER — Inpatient Hospital Stay
Admit: 2018-08-24 | Discharge: 2018-08-24 | Disposition: A | Discharge status: Home / Self Care | Payer: MEDICARE | Attending: Emergency Medicine

## 2018-08-24 DIAGNOSIS — S0180XA Unspecified open wound of other part of head, initial encounter: Secondary | ICD-10-CM

## 2018-08-24 DIAGNOSIS — R58 Hemorrhage, not elsewhere classified: Secondary | ICD-10-CM

## 2018-08-24 MED ORDER — TRANEXAMIC ACID 1000 MG/10ML IV SOLN
100010 MG/10ML | Freq: Once | INTRAVENOUS | Status: AC
Start: 2018-08-24 — End: 2018-08-24
  Administered 2018-08-24: 20:00:00 1000 mg via NASAL

## 2018-08-24 NOTE — ED Notes (Signed)
Dressing applied per dr Luana Shu. No active bleeding noted at present.     Leane Platt, RN  08/24/18 [phone]

## 2018-08-24 NOTE — Discharge Instructions (Signed)
If bleeding recurs, apply direct pressure for 20 minutes.  Return if you cannot stop the bleeding.  Use caution when removing the bandage for changing the dressing.  See your dermatologist as scheduled.    PLEASE RETURN TO THE EMERGENCY DEPARTMENT IMMEDIATELY if your symptoms worsen in anyway.  You should immediately return to the ER for symptoms such as increasing pain, fever, drainage from the wound, warmth or redness around the cut, increasing swelling, numbness or weakness to the extremity, color change or coldness to the extremity    Take your medication as indicated and prescribed.  If you are given an antibiotic then, make sure you get the prescription filled and take the antibiotics until finished.  It is advised that you keep your wound clean and dry.  You may apply antibiotic ointment to the area.       Please understand that at this time there is no evidence for a more serious underlying process, but that early in the process of an illness or injury, an emergency department workup can be falsely reassuring.  You should contact your family doctor within the next 48 hours for a follow up appointment.      [place]!!!    From [facility] and [facility]    On behalf of the Emergency Department staff at [facility], I would like to thank you for giving [place] the opportunity to address your health care needs and concerns.    We hope that during your visit, our service was delivered in a professional and caring manner. Please keep [facility] in mind as we walk with you down the path to your own personal wellness.     Please expect an automated text message or email from [place] so we can ask a few questions about your health and progress. Based on your answers, a clinician may call you back to offer help and instructions.    Please understand that early in the process of an illness or injury, an emergency department workup can be falsely reassuring.  If you notice any worsening, changing or  persistent symptoms please call your family doctor or return to the ER immediately.     Tell us how we did during your visit at [email or web address]   and let us know about your experience

## 2018-08-24 NOTE — ED Notes (Signed)
Dr Luana Shu holding pressure after txa applied to guaze and applied to left outer nares.      Leane Platt, RN  08/24/18 1623

## 2018-08-24 NOTE — ED Notes (Signed)
Pressure continuing to be applied per Dr Luana Shu. Patient cleansed of blood.      Leane Platt, RN  08/24/18 [phone]

## 2018-08-24 NOTE — ED Provider Notes (Signed)
[facility]  eMERGENCY dEPARTMENT eNCOUnter      Pt Name: Katie Juarez  MRN: [id number]  [place] August 31, 1940  Date of evaluation: 08/24/2018      CHIEF COMPLAINT       Chief Complaint   Patient presents with   ??? Drainage from Incision     left outer nostril         HISTORY OF PRESENT ILLNESS      The patient presents with bleeding from a wound on the outside of her nose.  The patient had a skin cancer removed from the outside of her nose 10 days ago.  She saw the dermatologist yesterday and he cauterized a couple areas.  Today she was cleaning the wound and it started bleeding.  She couldn't get it to stop.  She is not having bleeding from the inside of her nose.  She does not use blood thinners.      REVIEW OF SYSTEMS       All systems reviewed and negative unless noted in HPI.  The patient denies fever or constitutional symptoms.    Denies vision change.   Denies any sore throat or rhinorrhea.    Recent removal of skin cancer from outside of left nose..    No recent psychiatric issues.   No easy bruising or bleeding.   Denies any polyuria, polydypsia or history of immunocompromise.      PAST MEDICAL HISTORY    has a past medical history of Breast cancer ([place]), DDD (degenerative disc disease), Large bowel obstruction ([place]), LBP (low back pain), and SS (spinal stenosis).    SURGICAL HISTORY      has a past surgical history that includes Hysterectomy (1980); bladder suspension (1990); Cholecystectomy (1990); Breast lumpectomy (Right, 1999); other surgical history (11/14/13, 06/23/11, 06/30/11, 07/12/11 12/29/11); other surgical history (12/17/13); other surgical history (Bilateral, 05/30/14); other surgical history (Bilateral, 09/16/2014); other surgical history (Bilateral, 09/26/2014); other surgical history (10/10/2014); other surgical history (10/31/2014); Tonsillectomy (1946); Appendectomy (1956); Small intestine surgery (02/20/2015); and Femoral hernia repair (Left, 02/20/2015).    CURRENT MEDICATIONS        Previous Medications    ALBUTEROL SULFATE HFA (VENTOLIN HFA) 108 (90 BASE) MCG/ACT INHALER    Ventolin HFA 90 mcg/actuation aerosol inhaler   Inhale 2 puffs every 6 hours by inhalation route as needed.   ASTHMA    ATORVASTATIN (LIPITOR) 20 MG TABLET    Take 1 tablet by mouth daily    BUDESONIDE-FORMOTEROL (SYMBICORT) 160-4.5 MCG/ACT AERO    Inhale 2 puffs into the lungs 2 times daily Rinse mouth or brush teeth after each use.    CIPROFLOXACIN (CIPRO) 500 MG TABLET    Take 1 tablet by mouth 2 times daily    CITALOPRAM (CELEXA) 20 MG TABLET    Take 1 tablet by mouth daily    DOCUSATE SODIUM (COLACE) 100 MG CAPSULE    Take 100 mg by mouth nightly     FLUTICASONE (FLONASE) 50 MCG/ACT NASAL SPRAY    1 spray by Nasal route daily    GUAIFENESIN (MUCINEX) 600 MG EXTENDED RELEASE TABLET    Take 1 tablet by mouth 2 times daily for 15 days    KETOCONAZOLE (NIZORAL) 2 % SHAMPOO    Use to shampoo hair 3x's weekly for 4 weeks. Keep lather on for 10 minutes before rinsing.    LACTOBACILLUS (PROBIOTIC ACIDOPHILUS PO)    Take by mouth    LORAZEPAM (ATIVAN) 0.5 MG TABLET    TAKE  1 TABLET BY MOUTH NIGHTLY AS NEEDED FOR ANXIETY    MULTIPLE VITAMINS-MINERALS (THERAPEUTIC MULTIVITAMIN-MINERALS) TABLET    Take 1 tablet by mouth daily    POLYVINYL ALCOHOL-POVIDONE (HYPOTEARS) 1.4-0.6 % OPHTHALMIC SOLUTION    Place 1-2 drops into both eyes as needed.       ALLERGIES     is allergic to morphine; pcn [penicillins]; zithromax [azithromycin]; and adhesive tape.    FAMILY HISTORY     She indicated that her mother is deceased. She indicated that her father is deceased. She indicated that two of her five sisters are alive. She indicated that two of her five brothers are alive. She indicated that the status of her maternal grandfather is unknown.     family history includes Alzheimer's Disease in her sister; Cancer in her brother and sister; Dementia in her sister; Diabetes in her maternal grandfather; Lupus in her sister; Other in her brother,  brother, and mother; Stroke in her father.    SOCIAL HISTORY      reports that she quit smoking about 30 years ago. She started smoking about 65 years ago. She has a 40.00 pack-year smoking history. She has never used smokeless tobacco. She reports that she does not drink alcohol or use drugs.    PHYSICAL EXAM     INITIAL VITALS:  blood pressure is 127/89 and her pulse is 61. Her oxygen saturation is 96%.      Patient is alert and oriented, in no apparent distress.    HEENT is atraumatic.    Pupils are PERRL at 4 mm with normal extraocular motion.    Mucous membranes moist.  1 cm round area noted on the left external nose which is not actively bleeding.  A small scab has formed.  This is not involving the inside of the nose.  Neck is supple with no lymphadenopathy.   Heart sounds regular rate and rhythm.   Lungs clear, no wheezes, rales or rhonchi.    Abdomen: soft, nontender with no pain to palpation.  Musculoskeletal exam shows no evidence of trauma.  Normal distal pulses in all extremities.  Skin: no rash or edema.  Facial wound as above.  Neurological exam reveals cranial nerves 2 through 12 grossly intact.  Patient has equal grips and normal deep tendon reflexes.        DIFFERENTIAL DIAGNOSIS/ MDM:     Wound bleeding, infection    DIAGNOSTIC RESULTS         EMERGENCY DEPARTMENT COURSE:   Vitals:    Vitals:    08/24/18 1153   BP: 127/89   Pulse: 61   SpO2: 96%     -------------------------  BP: 127/89,  , Pulse: 61,        Re-evaluation Notes    The patient dressed the wound.  The patient was observed in the emergency department.  She has no further bleeding.  She is advised to moisten the area before removing the dressing tomorrow when she changes her dressing.  The patient is discharged in good condition.    PROCEDURES:    The nurse dressed the patient's wound with Surgicel and a dressing.    FINAL IMPRESSION      1. Hemorrhage from wound          DISPOSITION/PLAN   DISPOSITION        Condition on  Disposition    good    PATIENT REFERRED TO:  your dermatologist      1 week as scheduled  DISCHARGE MEDICATIONS:  New Prescriptions    No medications on file       (Please note that portions of this note were completed with a voice recognition program.  Efforts were made to edit the dictations but occasionally words are mis-transcribed.)    Jeanie Cooks,, MD   Attending Emergency Physician         Jeanie Cooks, MD  08/24/18 [phone]

## 2018-08-24 NOTE — Discharge Instructions (Signed)
Leave dressing in place for 2 days.  See your dermatologist to follow-up.  Return for bleeding or if worse in any way.    [place]!!!    From [facility] and [facility]    On behalf of the Emergency Department staff at [facility], I would like to thank you for giving [place] the opportunity to address your health care needs and concerns.    We hope that during your visit, our service was delivered in a professional and caring manner. Please keep [facility] in mind as we walk with you down the path to your own personal wellness.     Please expect an automated text message or email from [place] so we can ask a few questions about your health and progress. Based on your answers, a clinician may call you back to offer help and instructions.    Please understand that early in the process of an illness or injury, an emergency department workup can be falsely reassuring.  If you notice any worsening, changing or persistent symptoms please call your family doctor or return to the ER immediately.     Tell us how we did during your visit at [email or web address]   and let us know about your experience

## 2018-08-24 NOTE — ED Notes (Signed)
Dressing of surgicel with telfa over placed to left nares per Dr Luana Shu order.     Leane Platt, RN  08/24/18 [phone]

## 2018-08-24 NOTE — ED Notes (Signed)
Dr Luana Shu attempted to cauterize without success.      Leane Platt, RN  08/24/18 [phone]

## 2018-08-24 NOTE — ED Notes (Signed)
Patient brought in by [facility] with recurrent bleeding to left nares. Was seen earlier with same complaint. Active bleeding noted spurting blood from small vessel noted. Pressure applied. Dr Luana Shu to bedside     Leane Platt, RN  08/24/18 1622

## 2018-08-24 NOTE — ED Provider Notes (Signed)
[facility]  eMERGENCY dEPARTMENT eNCOUnter      Pt Name: Katie Juarez  MRN: [id number]  [place] 26-Apr-1940  Date of evaluation: 08/24/2018      CHIEF COMPLAINT       Chief Complaint   Patient presents with   ??? Wound Check     bleeding from incision left nares         HISTORY OF PRESENT ILLNESS      The patient returns with bleeding from the wound on her nose.  She was seen here earlier today for the same complaint.  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  Yesterday she went to her dermatologist and it started bleeding.  He did some cautery there.  She had bleeding today which change the wound.  She was here at her last visit, we put Surgicel on it and she had no bleeding.  She also had no bleeding at the time she arrived.      REVIEW OF SYSTEMS       All systems reviewed and negative unless noted in HPI.  The patient denies fever or constitutional symptoms.    Denies vision change.   Bleeding from wound on nose.   Denies any neck pain or stiffness.    Denies chest pain or shortness of breath.    No nausea,  vomiting or diarrhea.    Denies any dysuria.  Denies urinary frequency or hematuria.    Denies musculoskeletal injury or pain.   Denies any weakness, numbness or focal neurologic deficit.    Recent skin cancer removal; bleeding from wound.   No recent psychiatric issues.   No easy bruising or bleeding.   Denies any polyuria, polydypsia or history of immunocompromise.      PAST MEDICAL HISTORY    has a past medical history of Breast cancer ([place]), DDD (degenerative disc disease), Large bowel obstruction ([place]), LBP (low back pain), and SS (spinal stenosis).    SURGICAL HISTORY      has a past surgical history that includes Hysterectomy (1980); bladder suspension (1990); Cholecystectomy (1990); Breast lumpectomy (Right, 1999); other surgical history (11/14/13, 06/23/11, 06/30/11, 07/12/11 12/29/11); other surgical history (12/17/13); other surgical history (Bilateral, 05/30/14); other  surgical history (Bilateral, 09/16/2014); other surgical history (Bilateral, 09/26/2014); other surgical history (10/10/2014); other surgical history (10/31/2014); Tonsillectomy (1946); Appendectomy (1956); Small intestine surgery (02/20/2015); and Femoral hernia repair (Left, 02/20/2015).    CURRENT MEDICATIONS       Previous Medications    ALBUTEROL SULFATE HFA (VENTOLIN HFA) 108 (90 BASE) MCG/ACT INHALER    Ventolin HFA 90 mcg/actuation aerosol inhaler   Inhale 2 puffs every 6 hours by inhalation route as needed.   ASTHMA    ATORVASTATIN (LIPITOR) 20 MG TABLET    Take 1 tablet by mouth daily    BUDESONIDE-FORMOTEROL (SYMBICORT) 160-4.5 MCG/ACT AERO    Inhale 2 puffs into the lungs 2 times daily Rinse mouth or brush teeth after each use.    CIPROFLOXACIN (CIPRO) 500 MG TABLET    Take 1 tablet by mouth 2 times daily    CITALOPRAM (CELEXA) 20 MG TABLET    Take 1 tablet by mouth daily    DOCUSATE SODIUM (COLACE) 100 MG CAPSULE    Take 100 mg by mouth nightly     FLUTICASONE (FLONASE) 50 MCG/ACT NASAL SPRAY    1 spray by Nasal route daily    GUAIFENESIN (MUCINEX) 600 MG EXTENDED RELEASE TABLET    Take  1 tablet by mouth 2 times daily for 15 days    KETOCONAZOLE (NIZORAL) 2 % SHAMPOO    Use to shampoo hair 3x's weekly for 4 weeks. Keep lather on for 10 minutes before rinsing.    LACTOBACILLUS (PROBIOTIC ACIDOPHILUS PO)    Take by mouth    LORAZEPAM (ATIVAN) 0.5 MG TABLET    TAKE 1 TABLET BY MOUTH NIGHTLY AS NEEDED FOR ANXIETY    MULTIPLE VITAMINS-MINERALS (THERAPEUTIC MULTIVITAMIN-MINERALS) TABLET    Take 1 tablet by mouth daily    POLYVINYL ALCOHOL-POVIDONE (HYPOTEARS) 1.4-0.6 % OPHTHALMIC SOLUTION    Place 1-2 drops into both eyes as needed.       ALLERGIES     is allergic to morphine; pcn [penicillins]; zithromax [azithromycin]; and adhesive tape.    FAMILY HISTORY     She indicated that her mother is deceased. She indicated that her father is deceased. She indicated that two of her five sisters are alive. She  indicated that two of her five brothers are alive. She indicated that the status of her maternal grandfather is unknown.     family history includes Alzheimer's Disease in her sister; Cancer in her brother and sister; Dementia in her sister; Diabetes in her maternal grandfather; Lupus in her sister; Other in her brother, brother, and mother; Stroke in her father.    SOCIAL HISTORY      reports that she quit smoking about 30 years ago. She started smoking about 65 years ago. She has a 40.00 pack-year smoking history. She has never used smokeless tobacco. She reports that she does not drink alcohol or use drugs.    PHYSICAL EXAM     INITIAL VITALS:  tympanic temperature is 99.3 ??F (37.4 ??C). Her blood pressure is 98/61 and her pulse is 72. Her respiration is 16 and oxygen saturation is 94%.      Patient is alert and oriented, in no apparent distress.    HEENT is atraumatic.    Pupils are PERRL at 4 mm with normal extraocular motion.    Mucous membranes moist.  1 cm wound on left side of nose.  Small arteriole with pulsating blood noted.  Neck is supple.  Heart sounds regular rate and rhythm.  Lungs clear.    Abdomen: soft, nontender.  Musculoskeletal exam shows no evidence of trauma.    Skin: no rash or edema.    Neurological exam reveals cranial nerves 2 through 12 grossly intact.  Patient has equal grips and normal deep tendon reflexes.    Psychiatric: no hallucinations or suicidal ideation.   Lymphatics.:  No lymphadenopathy.       DIFFERENTIAL DIAGNOSIS/ MDM:     Bleeding from wound    DIAGNOSTIC RESULTS     Bleeding from wound    EMERGENCY DEPARTMENT COURSE:   Vitals:    Vitals:    08/24/18 1600 08/24/18 1615 08/24/18 1645   BP: (!) 103/56 108/66 98/61   Pulse: 69 72    Resp: 16     Temp: 99.3 ??F (37.4 ??C)     TempSrc: Tympanic     SpO2: 96% 94%      -------------------------  BP: 98/61, Temp: 99.3 ??F (37.4 ??C), Pulse: 72, Resp: 16      Re-evaluation Notes    Initially, I attempted to cauterize the bleeding area,  but the pulsatile structure was unable be cauterized.  It reacted well to topical TXA.  The patient was given wound care instructions.  She should leave the  dressing in for a couple of days to allow for good clot formed.  The patient is discharged in good condition.      PROCEDURES:    Hemorrhage management: I held direct pressure on the patient's wound.  There was a small arterial that was to pump blood from it.  I soaked a dental pledget that I trimmed to size in TXA.  This was held on the patient's wound for 15 minutes.  A second pledget was soaked and applied and then a piece of gauze was applied over it.  The patient had control of the hemorrhage.    FINAL IMPRESSION      1. Open wound of face, initial encounter          DISPOSITION/PLAN   DISPOSITION Decision To Discharge 08/24/2018 05:00:10 PM      Condition on Disposition    good    PATIENT REFERRED TO:  your dermatologist      as scheduled      DISCHARGE MEDICATIONS:  New Prescriptions    No medications on file       (Please note that portions of this note were completed with a voice recognition program.  Efforts were made to edit the dictations but occasionally words are mis-transcribed.)    Jeanie Cooks,, MD   Attending Emergency Physician         Jeanie Cooks, MD  08/24/18 1705

## 2018-08-25 ENCOUNTER — Inpatient Hospital Stay: Payer: MEDICARE | Primary: Family Medicine

## 2018-08-25 ENCOUNTER — Ambulatory Visit: Admit: 2018-08-25 | Discharge: 2018-08-25 | Payer: MEDICARE | Attending: Family | Primary: Family Medicine

## 2018-08-25 DIAGNOSIS — R300 Dysuria: Secondary | ICD-10-CM

## 2018-08-25 DIAGNOSIS — N3001 Acute cystitis with hematuria: Secondary | ICD-10-CM

## 2018-08-25 LAB — MICROSCOPIC URINALYSIS
Epithelial Cells UA: 5 /HPF (ref 0–5)
RBC, UA: >50 /HPF (ref 0–4)
WBC, UA: >100 /HPF (ref 0–4)

## 2018-08-25 LAB — URINALYSIS WITH REFLEX TO CULTURE
Nitrite, Urine: POSITIVE — AB
Specific Gravity, UA: 1.01 (ref 1.010–1.025)
Urobilinogen, Urine: 8 — AB
pH, UA: 5 (ref 5.0–6.0)

## 2018-08-25 MED ORDER — NITROFURANTOIN MONOHYD MACRO 100 MG PO CAPS
100 MG | ORAL_CAPSULE | Freq: Two times a day (BID) | ORAL | 0 refills | Status: AC
Start: 2018-08-25 — End: 2018-09-04

## 2018-08-25 NOTE — Progress Notes (Signed)
[name]             [name], [name]                        Telephone [phone]             Fax [phone]     Katie Juarez  1940-04-01  VOZ:[id number]   Date of visit:  08/25/2018    Subjective:    Katie Juarez is a 78 y.o. Caucasian female who presents to [facility] today (08/25/2018) for evaluation of:    Chief Complaint   Patient presents with   ??? Dysuria     Dysuria, urinary freq/urgency, & lower back pain x1 day.       Dysuria    This is a new problem. The current episode started today. The problem occurs every urination. The problem has been gradually worsening. The quality of the pain is described as aching. There has been no fever. She is not sexually active. There is no history of pyelonephritis. Associated symptoms include frequency and urgency. Pertinent negatives include no chills, discharge, flank pain, hematuria, nausea or vomiting. She has tried nothing for the symptoms. The treatment provided no relief.       She has the following problem list:  Patient Active Problem List   Diagnosis   ??? Lumbar facet joint syndrome ([place])   ??? 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 Pain of both sacroiliac joints   ??? Piriformis syndrome of left side   ??? Lumbar radicular pain   ??? Neural foraminal stenosis of lumbar spine   ??? Chronic low back pain   ??? Spinal stenosis, lumbar   ??? Diverticulitis   ??? Acquired absence of other specified parts of digestive tract   ??? Arthrodesis status   ??? Asymptomatic menopausal state   ??? Atherosclerosis of aorta ([place])   ??? Chronic obstructive pulmonary disease, unspecified ([place])   ??? Encounter for screening mammogram for malignant neoplasm of breast   ??? Extravasation of urine   ??? 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 Hyperlipidemia, unspecified        Current medications are:  Current  Outpatient Medications   Medication Sig Dispense Refill   ??? nitrofurantoin, macrocrystal-monohydrate, (MACROBID) 100 MG capsule Take 1 capsule by mouth 2 times daily for 10 days 20 capsule 0   ??? Lactobacillus (PROBIOTIC ACIDOPHILUS PO) Take by mouth     ??? Multiple Vitamins-Minerals (THERAPEUTIC MULTIVITAMIN-MINERALS) tablet Take 1 tablet by mouth daily     ??? ketoconazole (NIZORAL) 2 % shampoo Use to shampoo hair 3x's weekly for 4 weeks. Keep lather on for 10 minutes before rinsing. 1 Bottle 1   ??? ciprofloxacin (CIPRO) 500 MG tablet Take 1 tablet by mouth 2 times daily (Patient taking differently: Take 250 mg by mouth 2 times daily ) 20 tablet 0   ??? citalopram (CELEXA) 20 MG tablet Take 1 tablet by mouth daily 90 tablet 1   ??? LORazepam (ATIVAN) 0.5 MG tablet TAKE 1 TABLET BY MOUTH NIGHTLY AS NEEDED FOR ANXIETY 90 tablet 0   ??? budesonide-formoterol (SYMBICORT) 160-4.5 MCG/ACT AERO Inhale 2 puffs into the lungs 2 times daily Rinse mouth or brush teeth after each use. 3 Inhaler 3   ??? atorvastatin (LIPITOR) 20 MG tablet Take 1 tablet  by mouth daily 90 tablet 1   ??? albuterol sulfate HFA (VENTOLIN HFA) 108 (90 Base) MCG/ACT inhaler Ventolin HFA 90 mcg/actuation aerosol inhaler   Inhale 2 puffs every 6 hours by inhalation route as needed.   ASTHMA     ??? docusate sodium (COLACE) 100 MG capsule Take 100 mg by mouth nightly      ??? polyvinyl alcohol-povidone (HYPOTEARS) 1.4-0.6 % ophthalmic solution Place 1-2 drops into both eyes as needed.     ??? guaiFENesin (MUCINEX) 600 MG extended release tablet Take 1 tablet by mouth 2 times daily for 15 days (Patient not taking: Reported on 08/25/2018) 30 tablet 0   ??? fluticasone (FLONASE) 50 MCG/ACT nasal spray 1 spray by Nasal route daily (Patient not taking: Reported on 08/20/2018) 1 Bottle 0     No current facility-administered medications for this visit.         She is allergic to morphine; pcn [penicillins]; zithromax [azithromycin]; and adhesive tape..    She  reports that she quit  smoking about 30 years ago. She started smoking about 65 years ago. She has a 40.00 pack-year smoking history. She has never used smokeless tobacco.      Objective:    Vitals:    08/25/18 1707   BP: 126/64   Site: Left Upper Arm   Position: Sitting   Cuff Size: Medium Adult   Pulse: 67   Resp: 16   Temp: 98.2 ??F (36.8 ??C)   TempSrc: Tympanic   SpO2: 96%   Weight: 147 lb 3.2 oz (66.8 kg)   Height: 5\' 5"  (1.651 m)     Body mass index is 24.5 kg/m??.    Review of Systems   Constitutional: Negative.  Negative for chills.   HENT: Negative.    Respiratory: Negative.    Cardiovascular: Negative.    Gastrointestinal: Negative for nausea and vomiting.   Genitourinary: Positive for dysuria, frequency and urgency. Negative for flank pain, hematuria, pelvic pain and vaginal discharge.   Musculoskeletal: Positive for back pain (low back ache).       Physical Exam  Vitals signs and nursing note reviewed.   Constitutional:       Appearance: She is well-developed.   HENT:      Head: Normocephalic.   Eyes:      Pupils: Pupils are equal, round, and reactive to light.   Neck:      Musculoskeletal: Normal range of motion and neck supple.   Cardiovascular:      Rate and Rhythm: Normal rate and regular rhythm.      Heart sounds: Normal heart sounds.   Pulmonary:      Effort: Pulmonary effort is normal.      Breath sounds: Normal breath sounds.   Abdominal:      General: Abdomen is flat. Bowel sounds are normal.      Palpations: Abdomen is soft.      Tenderness: There is no abdominal tenderness. There is no right CVA tenderness or left CVA tenderness.   Musculoskeletal:      Lumbar back: She exhibits tenderness (with palpation). She exhibits normal range of motion, no bony tenderness and no swelling.        Back:    Skin:     General: Skin is warm and dry.   Neurological:      Mental Status: She is alert and oriented to person, place, and time.   Psychiatric:         Behavior: Behavior normal.  Thought Content: Thought content  normal.       Assessment and Plan:    Hospital Outpatient Visit on 08/25/2018   Component Date Value Ref Range Status   ??? Color, UA 08/25/2018 NOT REPORTED  YELLOW Final   ??? Turbidity UA 08/25/2018 NOT REPORTED  CLEAR Final   ??? Glucose, Ur 08/25/2018 1+* NEGATIVE Final   ??? Bilirubin Urine 08/25/2018 1+* NEGATIVE Final   ??? Ketones, Urine 08/25/2018 TRACE* NEGATIVE Final   ??? Specific Gravity, UA 08/25/2018 1.010  1.010 - 1.025 Final   ??? Urine Hgb 08/25/2018 3+* NEGATIVE Final   ??? pH, UA 08/25/2018 5.0  5.0 - 6.0 Final   ??? Protein, UA 08/25/2018 3+* NEGATIVE Final   ??? Urobilinogen, Urine 08/25/2018 8 mg/dL* Normal Final   ??? Nitrite, Urine 08/25/2018 POSITIVE* NEGATIVE Final   ??? Leukocyte Esterase, Urine 08/25/2018 3+* NEGATIVE Final   ??? Urinalysis Comments 08/25/2018 NOT REPORTED   Final   ??? - 08/25/2018        Final   ??? WBC, UA 08/25/2018 >100  0 - 4 /HPF Final   ??? RBC, UA 08/25/2018 >50  0 - 4 /HPF Final   ??? Casts UA 08/25/2018 NOT REPORTED  0 - 2 /LPF Final   ??? Crystals, UA 08/25/2018 NOT REPORTED  None /HPF Final   ??? Epithelial Cells UA 08/25/2018 5 TO 10  0 - 5 /HPF Final   ??? Renal Epithelial, UA 08/25/2018 NOT REPORTED  0 /HPF Final   ??? Bacteria, UA 08/25/2018 3+* None Final   ??? Mucus, UA 08/25/2018 NOT REPORTED  None Final   ??? Trichomonas, UA 08/25/2018 NOT REPORTED  None Final   ??? Amorphous, UA 08/25/2018 NOT REPORTED  None Final   ??? Other Observations UA 08/25/2018 Specimen Cultured* NOT REQ. Final   ??? Yeast, UA 08/25/2018 NOT REPORTED  None Final          Diagnosis Orders   1. Acute cystitis with hematuria  nitrofurantoin, macrocrystal-monohydrate, (MACROBID) 100 MG capsule   2. Dysuria  Urinalysis Reflex to Culture   3. Acute bilateral low back pain without sciatica       I reviewed labs with patient.  Complete full course of antibiotic. Take Tylenol as needed for pain/fever. Increase water intake. Call or return to clinic as needed if these symptoms worsen or fail to improve in the next 48 hours.  If urine  culture was sent, the patient will be notified of results.    The use, risks, benefits, and side effects of prescribed or recommended medications were discussed. All questions were answered and the patient/caregiver voiced understanding.    No orders of the defined types were placed in this encounter.        Electronically signed by Darolyn Rua, APRN - CNP on 08/25/18 at 5:11 PM EDT

## 2018-08-25 NOTE — Patient Instructions (Signed)
Patient Education        Urinary Tract Infection in Women: Care Instructions  Your Care Instructions     A urinary tract infection, or UTI, is a general term for an infection anywhere between the kidneys and the urethra (where urine comes out). Most UTIs are bladder infections. They often cause pain or burning when you urinate.  UTIs are caused by bacteria and can be cured with antibiotics. Be sure to complete your treatment so that the infection goes away.  Follow-up care is a key part of your treatment and safety. Be sure to make and go to all appointments, and call your doctor if you are having problems. It's also a good idea to know your test results and keep a list of the medicines you take.  How can you care for yourself at home?  ?? Take your antibiotics as directed. Do not stop taking them just because you feel better. You need to take the full course of antibiotics.  ?? Drink extra water and other fluids for the next day or two. This may help wash out the bacteria that are causing the infection. (If you have kidney, heart, or liver disease and have to limit fluids, talk with your doctor before you increase your fluid intake.)  ?? Avoid drinks that are carbonated or have caffeine. They can irritate the bladder.  ?? Urinate often. Try to empty your bladder each time.  ?? To relieve pain, take a hot bath or lay a heating pad set on low over your lower belly or genital area. Never go to sleep with a heating pad in place.  To prevent UTIs  ?? Drink plenty of water each day. This helps you urinate often, which clears bacteria from your system. (If you have kidney, heart, or liver disease and have to limit fluids, talk with your doctor before you increase your fluid intake.)  ?? Urinate when you need to.  ?? Urinate right after you have sex.  ?? Change sanitary pads often.  ?? Avoid douches, bubble baths, feminine hygiene sprays, and other feminine hygiene products that have deodorants.  ?? After going to the bathroom,  wipe from front to back.  When should you call for help?   Call your doctor now or seek immediate medical care if:  ?? Symptoms such as fever, chills, nausea, or vomiting get worse or appear for the first time.  ?? You have new pain in your back just below your rib cage. This is called flank pain.  ?? There is new blood or pus in your urine.  ?? You have any problems with your antibiotic medicine.  Watch closely for changes in your health, and be sure to contact your doctor if:  ?? You are not getting better after taking an antibiotic for 2 days.  ?? Your symptoms go away but then come back.  Where can you learn more?  Go to [email or web address] and sign in to your MyChart account. Enter K848 in the Search Health Information box to learn more about "Urinary Tract Infection in Women: Care Instructions."     If you do not have an account, please click on the "Sign Up Now" link.  Current as of: August 31, 2017??????????????????????????????Content Version: 12.5  ?? 2006-2020 [employer].   Care instructions adapted under license by [facility]. If you have questions about a medical condition or this instruction, always ask your healthcare professional. [employer] disclaims any warranty or liability for your use of this   information.

## 2018-08-27 LAB — CULTURE, URINE: Culture: NO GROWTH

## 2018-09-12 ENCOUNTER — Encounter: Attending: Urology | Primary: Family Medicine

## 2018-09-19 ENCOUNTER — Encounter

## 2018-09-19 NOTE — Telephone Encounter (Signed)
Last appt 08-20-18.  Next appt 11-05-18.  Lorazepam last filled June 10 for #90 per [place].

## 2018-09-20 MED ORDER — LORAZEPAM 0.5 MG PO TABS
0.5 MG | ORAL_TABLET | ORAL | 0 refills | Status: DC
Start: 2018-09-20 — End: 2018-12-17

## 2018-09-20 NOTE — Telephone Encounter (Signed)
Controlled Substance Monitoring:    Acute and Chronic Pain Monitoring:   RX Monitoring 09/20/2018   Periodic Controlled Substance Monitoring No signs of potential drug abuse or diversion identified.    Obtained or confirmed "Medication Contract" on file.

## 2018-11-05 ENCOUNTER — Ambulatory Visit: Admit: 2018-11-05 | Discharge: 2018-11-05 | Payer: MEDICARE | Attending: Family Medicine | Primary: Family Medicine

## 2018-11-05 DIAGNOSIS — Z1231 Encounter for screening mammogram for malignant neoplasm of breast: Secondary | ICD-10-CM

## 2018-11-05 MED ORDER — ATORVASTATIN CALCIUM 20 MG PO TABS
20 MG | ORAL_TABLET | Freq: Every day | ORAL | 0 refills | Status: DC
Start: 2018-11-05 — End: 2019-12-04

## 2018-11-05 NOTE — Progress Notes (Signed)
Medicare Annual Wellness Visit  Name: Katie Juarez Today???s Date: 11/05/2018   MRN: [id number] Sex: Female   Age: 78 y.o. Ethnicity: Non-Hispanic/Non Latino   DOB: 29-Oct-1940 Race: Kennedie Jankowiak is here for Medicare AWV and Anxiety (flying to [place] in Jan- needs letter for airline to travel with dog)    Screenings for behavioral, psychosocial and functional/safety risks, and cognitive dysfunction are all negative except as indicated below. These results, as well as other patient data from the [place] form, are documented in Flowsheets linked to this Encounter.    Allergies   Allergen Reactions   ??? Morphine Other (See Comments)     Night sweats,felt "loopy".   ??? Pcn [Penicillins] Hives   ??? Zithromax [Azithromycin] Hives   ??? Adhesive Tape Rash       Prior to Visit Medications    Medication Sig Taking? Authorizing Provider   doxycycline hyclate (VIBRAMYCIN) 100 MG capsule TAKE 1 CAPSULE BY MOUTH TWICE DAILY FOR 7 DAYS Yes Historical Provider, MD   LORazepam (ATIVAN) 0.5 MG tablet TAKE 1 TABLET BY MOUTH NIGHTLY AS NEEDED FOR ANXIETY Yes Leandrew Koyanagi Berda Shelvin, DO   Lactobacillus (PROBIOTIC ACIDOPHILUS PO) Take by mouth Yes Historical Provider, MD   Multiple Vitamins-Minerals (THERAPEUTIC MULTIVITAMIN-MINERALS) tablet Take 1 tablet by mouth daily Yes Historical Provider, MD   ketoconazole (NIZORAL) 2 % shampoo Use to shampoo hair 3x's weekly for 4 weeks. Keep lather on for 10 minutes before rinsing. Yes Leandrew Koyanagi Vin Yonke, DO   citalopram (CELEXA) 20 MG tablet Take 1 tablet by mouth daily Yes Leandrew Koyanagi Kingdom Vanzanten, DO   budesonide-formoterol (SYMBICORT) 160-4.5 MCG/ACT AERO Inhale 2 puffs into the lungs 2 times daily Rinse mouth or brush teeth after each use. Yes Leandrew Koyanagi Avangeline Stockburger, DO   atorvastatin (LIPITOR) 20 MG tablet Take 1 tablet by mouth daily Yes Leandrew Koyanagi Jashira Cotugno, DO   fluticasone (FLONASE) 50 MCG/ACT nasal spray 1 spray by Nasal route daily Yes Jessica A Hermiller, APRN - CNP   albuterol sulfate HFA  (VENTOLIN HFA) 108 (90 Base) MCG/ACT inhaler Ventolin HFA 90 mcg/actuation aerosol inhaler   Inhale 2 puffs every 6 hours by inhalation route as needed.   ASTHMA Yes Historical Provider, MD   docusate sodium (COLACE) 100 MG capsule Take 100 mg by mouth nightly  Yes Historical Provider, MD   polyvinyl alcohol-povidone (HYPOTEARS) 1.4-0.6 % ophthalmic solution Place 1-2 drops into both eyes as needed. Yes Historical Provider, MD       Past Medical History:   Diagnosis Date   ??? 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 Breast cancer ([place]) 1999    in remission   ??? DDD (degenerative disc disease)    ??? Large bowel obstruction ([place]) 02/2015    [facility]   ??? LBP (low back pain)    ??? SS (spinal stenosis)        Past Surgical History:   Procedure Laterality Date   ??? APPENDECTOMY  1956   ??? BLADDER SUSPENSION  1990   ??? BREAST LUMPECTOMY Right 1999   ??? CHOLECYSTECTOMY  1990   ??? EXCISION/BIOPSY Left 11/01/2018    Dr Yehuda Savannah [name], for [facility]   ??? FEMORAL HERNIA REPAIR Left 02/20/2015    strangulated with small bowel resection - [facility], [place]   ??? [place] for endometriosis; done in  [place]   ??? OTHER SURGICAL HISTORY  11/14/13, 06/23/11, 06/30/11, 07/12/11 12/29/11    L4/L5 IESI   ??? OTHER SURGICAL HISTORY  12/17/13    L5/S1 IESI   ??? OTHER SURGICAL HISTORY Bilateral 05/30/14    SIJ and piriformis   ??? OTHER SURGICAL HISTORY Bilateral 09/16/2014    L3, L4, L5 Diagnostic Medial Branch Block   ??? OTHER SURGICAL HISTORY Bilateral 09/26/2014    L5 TFE   ??? OTHER SURGICAL HISTORY  10/10/2014    bil L5 TFE   ??? OTHER SURGICAL HISTORY  10/31/2014    caudal   ??? SMALL INTESTINE SURGERY  02/20/2015    strangulated left femoral hernia   ??? TONSILLECTOMY  1946       Family History   Problem Relation Age of Onset   ??? Other Mother         ALS - diagnosed at age 6   ??? Stroke Father         age 35   ??? Cancer Sister         pt thinks it was liver   ??? Cancer Brother          leukemia   ??? Diabetes Maternal Grandfather    ??? Other Brother         drowned   ??? Other Brother         died at age 61; think due to spinal meningitis   ??? Lupus Sister    ??? Alzheimer's Disease Sister    ??? Dementia Sister        CareTeam (Including outside providers/suppliers regularly involved in providing care):   Patient Care Team:  [name], DO as PCP - General (Family Medicine)  [name], DO as PCP - [facility] Empaneled Provider    Wt Readings from Last 3 Encounters:   11/05/18 146 lb (66.2 kg)   08/25/18 147 lb 3.2 oz (66.8 kg)   08/20/18 147 lb 8 oz (66.9 kg)     Vitals:    11/05/18 1112   BP: 112/76   Site: Right Upper Arm   Position: Sitting   Cuff Size: Medium Adult   Pulse: 60   SpO2: 96%   Weight: 146 lb (66.2 kg)   Height: 5\' 4"  (1.626 m)     Body mass index is 25.06 kg/m??.    Based upon direct observation of the patient, evaluation of cognition reveals recent and remote memory intact.    Patient's complete Health Risk Assessment and screening values have been reviewed and are found in Flowsheets. The following problems were reviewed today and where indicated follow up appointments were made and/or referrals ordered.    Positive Risk Factor Screenings with Interventions:     General Health and ACP:  General  In general, how would you say your health is?: Very Good  In the past 7 days, have you experienced any of the following? New or Increased Pain, New or Increased Fatigue, Loneliness, Social Isolation, Stress or Anger?: (!) Loneliness  Do you get the social and emotional support that you need?: Yes  Do you have a Living Will?: Yes  Advance Directives     Power of Attorney Living Will ACP-Advance Directive ACP-Power of Attorney    Not on File [place] on 11/03/17 Filed [place] Risk Interventions:  ?? Loneliness: husband just recently passed away    Personalized Preventive Plan   Current Health Maintenance Status  Immunization  History   Administered Date(s) Administered   ??? Influenza  Virus Vaccine 10/28/2011, 09/29/2012, 09/18/2013, 10/23/2014, 09/30/2015, 09/14/2016   ??? Influenza, [place], adjuvanted, 65 yrs +, IM, PF (Fluad) 10/02/2018   ??? Influenza, [place]riv, inactivated, subunit, adjuvanted, IM (Fluad 65 yrs and older) 09/14/2016, 09/27/2017   ??? Pneumococcal Conjugate 13-valent (Prevnar13) 09/18/2013   ??? Pneumococcal Polysaccharide (Pneumovax23) 05/21/2009, 05/27/2015   ??? Tdap (Boostrix, Adacel) 09/27/2017   ??? Zoster Recombinant (Shingrix) 10/02/2018        Health Maintenance   Topic Date Due   ??? Annual Wellness Visit (AWV)  04/17/2018   ??? Lipid screen  09/28/2018   ??? Shingles Vaccine (2 of 2) 11/27/2018   ??? DTaP/Tdap/Td vaccine (2 - Td) 09/28/2027   ??? DEXA (modify frequency per FRAX score)  Completed   ??? Flu vaccine  Completed   ??? Pneumococcal 65+ years Vaccine  Completed   ??? Hepatitis A vaccine  Aged Out   ??? Hepatitis B vaccine  Aged Out   ??? Hib vaccine  Aged Out   ??? Meningococcal (ACWY) vaccine  Aged Out     Recommendations for Preventive Services Due: see orders and patient instructions/AVS.  .  Recommended screening schedule for the next 5-10 years is provided to the patient in written form: see Patient Instructions/AVS.    Cabrielle was seen today for medicare awv and anxiety.    Diagnoses and all orders for this visit:    Screening mammogram, encounter for    Anxiety    Hyperlipidemia, unspecified hyperlipidemia type    Routine general medical examination at a health care facility

## 2018-11-05 NOTE — Patient Instructions (Signed)
Personalized Preventive Plan for SUNSHYNE HANDWERK - 11/05/2018  Medicare offers a range of preventive health benefits. Some of the tests and screenings are paid in full while other may be subject to a deductible, co-insurance, and/or copay.    Some of these benefits include a comprehensive review of your medical history including lifestyle, illnesses that may run in your family, and various assessments and screenings as appropriate.    After reviewing your medical record and screening and assessments performed today your provider may have ordered immunizations, labs, imaging, and/or referrals for you.  A list of these orders (if applicable) as well as your Preventive Care list are included within your After Visit Summary for your review.    Other Preventive Recommendations:    ?? A preventive eye exam performed by an eye specialist is recommended every 1-2 years to screen for glaucoma; cataracts, macular degeneration, and other eye disorders.  ?? A preventive dental visit is recommended every 6 months.  ?? Try to get at least 150 minutes of exercise per week or 10,000 steps per day on a pedometer .  ?? Order or download the FREE "Exercise & Physical Activity: Your Everyday Guide" from [employer]. Call 6847348898 or search [employer] online.  ?? You need 1200-1500 mg of calcium and 1000-2000 IU of vitamin D per day. It is possible to meet your calcium requirement with diet alone, but a vitamin D supplement is usually necessary to meet this goal.  ?? When exposed to the sun, use a sunscreen that protects against both UVA and UVB radiation with an SPF of 30 or greater. Reapply every 2 to 3 hours or after sweating, drying off with a towel, or swimming.  ?? Always wear a seat belt when traveling in a car. Always wear a helmet when riding a bicycle or motorcycle.

## 2018-11-05 NOTE — Progress Notes (Signed)
[facility]  [address] [place] [address]  [phone]      Katie Juarez is a 78 y.o. female who presents today for her medical conditions/complaints as noted below.  Katie Juarez is c/o of Medicare AWV and Anxiety (flying to [place] in Jan- needs letter for airline to travel with dog)      HPI:     Pt here today for follow-up of anxiety and MAW visit (see other note).    Taking only Celexa 10 mg (1/2 of a 20 mg tab) at bedtime, along with Ativan 0.5 mg nightly.  Has taken a full 20 mg Celexa some timesIf she takes a whole Celexa, she will be more tired the next day.      Plans to get a new puppy Katie Juarez mix) soon, as her other dog passed away this spring.  Plans to go to [place] in Jan - April - would like a form signed again this year for the puppy to travel with her for emotional support.    Last Thursday, she had basal cell carcinoma removed from L cheek from Dr. Abbey Chatters office.  Currently on Doxycycline x 7 days due to this.  Healing well; has one area that looks slightly worse.  Will go back tomorrow to see Dr. Yehuda Savannah for follow-up and suture removal.    BP well-controlled today - 112/76.        Past Medical History:   Diagnosis Date   ??? 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 Breast cancer ([place]) 1999    in remission   ??? DDD (degenerative disc disease)    ??? Large bowel obstruction ([place]) 02/2015    [facility]   ??? LBP (low back pain)    ??? SS (spinal stenosis)       Past Surgical History:   Procedure Laterality Date   ??? APPENDECTOMY  1956   ??? BLADDER SUSPENSION  1990   ??? BREAST LUMPECTOMY Right 1999   ??? CHOLECYSTECTOMY  1990   ??? EXCISION/BIOPSY Left 11/01/2018    Dr Yehuda Savannah [name], for [facility]   ??? FEMORAL HERNIA REPAIR Left 02/20/2015    strangulated with small bowel resection - [facility], [place]   ??? [place] for endometriosis; done in [place]   ??? OTHER SURGICAL HISTORY   11/14/13, 06/23/11, 06/30/11, 07/12/11 12/29/11    L4/L5 IESI   ??? OTHER SURGICAL HISTORY  12/17/13    L5/S1 IESI   ??? OTHER SURGICAL HISTORY Bilateral 05/30/14    SIJ and piriformis   ??? OTHER SURGICAL HISTORY Bilateral 09/16/2014    L3, L4, L5 Diagnostic Medial Branch Block   ??? OTHER SURGICAL HISTORY Bilateral 09/26/2014    L5 TFE   ??? OTHER SURGICAL HISTORY  10/10/2014    bil L5 TFE   ??? OTHER SURGICAL HISTORY  10/31/2014    caudal   ??? SMALL INTESTINE SURGERY  02/20/2015    strangulated left femoral hernia   ??? TONSILLECTOMY  1946     Family History   Problem Relation Age of Onset   ??? Other Mother         ALS - diagnosed at age 90   ??? Stroke Father         age 11   ??? Cancer Sister  pt thinks it was liver   ??? Cancer Brother         leukemia   ??? Diabetes Maternal Grandfather    ??? Other Brother         drowned   ??? Other Brother         died at age 75; think due to spinal meningitis   ??? Lupus Sister    ??? Alzheimer's Disease Sister    ??? Dementia Sister      Social History     Tobacco Use   ??? Smoking status: Former Smoker     Packs/day: 1.00     Years: 40.00     Pack years: 40.00     Start date: 01/10/1953     Last attempt to quit: 01/11/1988     Years since quitting: 30.8   ??? Smokeless tobacco: Never Used   ??? Tobacco comment: quit 25 years ago 1990   Substance Use Topics   ??? Alcohol use: No     Alcohol/week: 0.0 standard drinks      Current Outpatient Medications   Medication Sig Dispense Refill   ??? atorvastatin (LIPITOR) 20 MG tablet Take 1 tablet by mouth daily 90 tablet 0   ??? doxycycline hyclate (VIBRAMYCIN) 100 MG capsule TAKE 1 CAPSULE BY MOUTH TWICE DAILY FOR 7 DAYS     ??? LORazepam (ATIVAN) 0.5 MG tablet TAKE 1 TABLET BY MOUTH NIGHTLY AS NEEDED FOR ANXIETY 90 tablet 0   ??? Lactobacillus (PROBIOTIC ACIDOPHILUS PO) Take by mouth     ??? Multiple Vitamins-Minerals (THERAPEUTIC MULTIVITAMIN-MINERALS) tablet Take 1 tablet by mouth daily     ??? ketoconazole (NIZORAL) 2 % shampoo Use to shampoo hair 3x's weekly for 4 weeks. Keep  lather on for 10 minutes before rinsing. 1 Bottle 1   ??? citalopram (CELEXA) 20 MG tablet Take 1 tablet by mouth daily 90 tablet 1   ??? budesonide-formoterol (SYMBICORT) 160-4.5 MCG/ACT AERO Inhale 2 puffs into the lungs 2 times daily Rinse mouth or brush teeth after each use. 3 Inhaler 3   ??? fluticasone (FLONASE) 50 MCG/ACT nasal spray 1 spray by Nasal route daily 1 Bottle 0   ??? albuterol sulfate HFA (VENTOLIN HFA) 108 (90 Base) MCG/ACT inhaler Ventolin HFA 90 mcg/actuation aerosol inhaler   Inhale 2 puffs every 6 hours by inhalation route as needed.   ASTHMA     ??? docusate sodium (COLACE) 100 MG capsule Take 100 mg by mouth nightly      ??? polyvinyl alcohol-povidone (HYPOTEARS) 1.4-0.6 % ophthalmic solution Place 1-2 drops into both eyes as needed.       No current facility-administered medications for this visit.      Allergies   Allergen Reactions   ??? Morphine Other (See Comments)     Night sweats,felt "loopy".   ??? Pcn [Penicillins] Hives   ??? Zithromax [Azithromycin] Hives   ??? Adhesive Tape Rash       Health Maintenance   Topic Date Due   ??? Annual Wellness Visit (AWV)  04/17/2018   ??? Lipid screen  09/28/2018   ??? Shingles Vaccine (2 of 2) 11/27/2018   ??? DTaP/Tdap/Td vaccine (2 - Td) 09/28/2027   ??? DEXA (modify frequency per FRAX score)  Completed   ??? Flu vaccine  Completed   ??? Pneumococcal 65+ years Vaccine  Completed   ??? Hepatitis A vaccine  Aged Out   ??? Hepatitis B vaccine  Aged Out   ??? Hib vaccine  Aged Out   ???  Meningococcal (ACWY) vaccine  Aged Out       Subjective:      Review of Systems   Constitutional: Negative for fever and unexpected weight change.   Respiratory: Negative for cough and shortness of breath.    Cardiovascular: Negative for chest pain, palpitations and leg swelling.   Gastrointestinal: Negative for blood in stool and constipation (taking stool softener at night).   Genitourinary: Negative for dysuria and hematuria.   Neurological: Negative for dizziness and light-headedness.    Psychiatric/Behavioral: Positive for sleep disturbance (intermittent). The patient is nervous/anxious (stable).        Objective:     Vitals:    11/05/18 1112   BP: 112/76   Site: Right Upper Arm   Position: Sitting   Cuff Size: Medium Adult   Pulse: 60   SpO2: 96%   Weight: 146 lb (66.2 kg)   Height: 5\' 4"  (1.626 m)     Physical Exam  Vitals signs and nursing note reviewed.   Constitutional:       General: She is not in acute distress.     Appearance: She is well-developed.   HENT:      Head: Normocephalic and atraumatic.      Right Ear: Tympanic membrane, ear canal and external ear normal.      Left Ear: Tympanic membrane, ear canal and external ear normal.      Nose: Nose normal.      Mouth/Throat:      Mouth: Mucous membranes are moist.      Pharynx: Oropharynx is clear. No posterior oropharyngeal erythema.   Eyes:      Conjunctiva/sclera: Conjunctivae normal.   Neck:      Musculoskeletal: Neck supple.   Cardiovascular:      Rate and Rhythm: Normal rate and regular rhythm.      Heart sounds: Normal heart sounds.   Pulmonary:      Effort: Pulmonary effort is normal. No respiratory distress.      Breath sounds: Normal breath sounds.   Abdominal:      General: Bowel sounds are normal. There is no distension.      Palpations: Abdomen is soft.      Tenderness: There is no abdominal tenderness.   Skin:     General: Skin is warm and dry.   Neurological:      Mental Status: She is alert and oriented to person, place, and time.         Assessment:       Diagnosis Orders   1. Anxiety     2. Hyperlipidemia, unspecified hyperlipidemia type  atorvastatin (LIPITOR) 20 MG tablet    Lipid Panel    Comprehensive Metabolic Panel   3. Routine general medical examination at a health care facility     4. Screening mammogram, encounter for  MAM TOMO DIGITAL SCREEN BILATERAL         Plan:      Return in 8 weeks (on 12/31/2018) for f/u meds.    Orders Placed This Encounter   Procedures   ??? MAM TOMO DIGITAL SCREEN BILATERAL      Standing Status:   Future     Standing Expiration Date:   01/05/2020     Scheduling Instructions:      Pt will schedule at [facility].     Order Specific Question:   Reason for exam:     Answer:   screening for breast cancer   ??? Lipid Panel  May take medication with a glass of water.     Standing Status:   Future     Standing Expiration Date:   11/05/2019     Order Specific Question:   Is Patient Fasting?/# of Hours     Answer:   12 hours   ??? Comprehensive Metabolic Panel     Standing Status:   Future     Standing Expiration Date:   11/05/2019     Orders Placed This Encounter   Medications   ??? atorvastatin (LIPITOR) 20 MG tablet     Sig: Take 1 tablet by mouth daily     Dispense:  90 tablet     Refill:  0       Patient given educational materials - see patient instructions.  Discussed use, benefit, and side effects of prescribed medications.  All patient questions answered.  Pt voiced understanding.  Reviewed health maintenance.            Electronically signed by [name], DO on 11/12/2018 at 12:10 AM

## 2018-11-16 ENCOUNTER — Encounter

## 2018-11-17 MED ORDER — CITALOPRAM HYDROBROMIDE 20 MG PO TABS
20 MG | ORAL_TABLET | Freq: Every day | ORAL | 3 refills | Status: DC
Start: 2018-11-17 — End: 2019-12-04

## 2018-12-03 LAB — COMPREHENSIVE METABOLIC PANEL
ALT: 18 units/L (ref 4–35)
AST: 27 units/L (ref 14–36)
Albumin/Globulin Ratio: 1.5 g/dL
Albumin: 4.1 g/dL (ref 3.5–5.0)
Alkaline Phosphatase: 58 units/L (ref 38–126)
Anion Gap: 7.5 mmol/L
BUN: 22 mg/dL — ABNORMAL HIGH (ref 7–17)
CO2: 29 mmol/L (ref 22–31)
Calcium: 9.2 mg/dL (ref 8.4–10.2)
Chloride: 104 mmol/L (ref 98–120)
Creatinine: 0.8 mg/dL (ref 0.5–1.0)
Gfr Calculated: >60
Globulin: 2.8 g/dL
Glucose: 100 mg/dL (ref 65–105)
Potassium: 4.4 mmol/L (ref 3.6–5.0)
Sodium: 140 mmol/L (ref 135–145)
Total Bilirubin: 0.5 mg/dL (ref 0.2–1.3)
Total Protein, Serum: 6.9 g/dL (ref 6.3–8.2)

## 2018-12-03 LAB — LIPID PANEL
Chol/HDL Ratio: 2.43 ratio (ref 0.0–4.50)
Cholesterol: 129 mg/dL (ref 50–200)
HDL: 53 mg/dL (ref 36–68)
LDL Calculated: 48 mg/dL (ref 0.0–160.0)
Triglycerides: 140 mg/dL (ref 10–250)
VLDL: 28 mg/dL (ref 0–50)

## 2018-12-11 ENCOUNTER — Encounter

## 2018-12-11 NOTE — Telephone Encounter (Signed)
Mayer Camel with lab/mammo results.  Says she'll need her Lorazepam refilled next week.  [place].  Last filled Sept 10.

## 2018-12-16 ENCOUNTER — Encounter

## 2018-12-17 NOTE — Telephone Encounter (Signed)
Lorazepam last filled Sept 10 per [place].

## 2018-12-18 MED ORDER — LORAZEPAM 0.5 MG PO TABS
0.5 MG | ORAL_TABLET | ORAL | 0 refills | Status: DC
Start: 2018-12-18 — End: 2019-03-18

## 2018-12-18 NOTE — Telephone Encounter (Signed)
Controlled Substance Monitoring:    Acute and Chronic Pain Monitoring:   RX Monitoring 12/18/2018   Periodic Controlled Substance Monitoring No signs of potential drug abuse or diversion identified.    -

## 2018-12-21 ENCOUNTER — Emergency Department: Admit: 2018-12-21 | Payer: MEDICARE | Primary: Family Medicine

## 2018-12-21 ENCOUNTER — Observation Stay
Admit: 2018-12-21 | Discharge: 2018-12-22 | Disposition: A | Discharge status: Home / Self Care | Payer: Medicare Other | Source: Ambulatory Visit | Admitting: Family Medicine

## 2018-12-21 DIAGNOSIS — S32019A Unspecified fracture of first lumbar vertebra, initial encounter for closed fracture: Secondary | ICD-10-CM

## 2018-12-21 MED ORDER — OXYCODONE-ACETAMINOPHEN 5-325 MG PO TABS
5-325 MG | Freq: Once | ORAL | Status: AC
Start: 2018-12-21 — End: 2018-12-21
  Administered 2018-12-22: 1 via ORAL

## 2018-12-21 NOTE — ED Notes (Signed)
Patient transported to CT by CT tech.     Kyra Leyland, RN  12/21/18 [phone]

## 2018-12-21 NOTE — ED Notes (Signed)
Writer cleansed patient abrasions on left leg with normal saline, patted dry, applied bacitracin, and cover with band aids.     Kyra Leyland, RN  12/21/18 2025

## 2018-12-21 NOTE — ED Provider Notes (Signed)
[facility]  [address]  [place] [address]  Phone: [phone]        ADDENDUM:      Care of this patient was assumed from Dr. Jaci Lazier.  The patient was seen for Fall ("was stepping over a gate and fell onto carpeted floor, was twisting when I went down" denies LOC or hitting head) and Back Pain (with fall)  .  The patient's initial evaluation and plan have been discussed with the prior provider who initially evaluated the patient.  Nursing Notes, Past Medical Hx, Past Surgical Hx, Allergies, were all reviewed.    PAST MEDICAL HISTORY    has a past medical history of Basal cell carcinoma (BCC) of face, Breast cancer ([place]), DDD (degenerative disc disease), Large bowel obstruction ([place]), LBP (low back pain), and SS (spinal stenosis).    SURGICAL HISTORY      has a past surgical history that includes Hysterectomy (1980); bladder suspension (1990); Cholecystectomy (1990); Breast lumpectomy (Right, 1999); other surgical history (11/14/13, 06/23/11, 06/30/11, 07/12/11 12/29/11); other surgical history (12/17/13); other surgical history (Bilateral, 05/30/14); other surgical history (Bilateral, 09/16/2014); other surgical history (Bilateral, 09/26/2014); other surgical history (10/10/2014); other surgical history (10/31/2014); Tonsillectomy (1946); Appendectomy (1956); Small intestine surgery (02/20/2015); Femoral hernia repair (Left, 02/20/2015); and Excision/Biopsy (Left, 11/01/2018).    CURRENT MEDICATIONS       Previous Medications    ALBUTEROL SULFATE HFA (VENTOLIN HFA) 108 (90 BASE) MCG/ACT INHALER    Ventolin HFA 90 mcg/actuation aerosol inhaler   Inhale 2 puffs every 6 hours by inhalation route as needed.   ASTHMA    ATORVASTATIN (LIPITOR) 20 MG TABLET    Take 1 tablet by mouth daily    BUDESONIDE-FORMOTEROL (SYMBICORT) 160-4.5 MCG/ACT AERO    Inhale 2 puffs into the lungs 2 times daily Rinse mouth or brush teeth after each use.    CITALOPRAM (CELEXA) 20 MG TABLET    Take 1 tablet by mouth  daily    DOCUSATE SODIUM (COLACE) 100 MG CAPSULE    Take 100 mg by mouth nightly     DOXYCYCLINE HYCLATE (VIBRAMYCIN) 100 MG CAPSULE    TAKE 1 CAPSULE BY MOUTH TWICE DAILY FOR 7 DAYS    FLUTICASONE (FLONASE) 50 MCG/ACT NASAL SPRAY    1 spray by Nasal route daily    KETOCONAZOLE (NIZORAL) 2 % SHAMPOO    Use to shampoo hair 3x's weekly for 4 weeks. Keep lather on for 10 minutes before rinsing.    LACTOBACILLUS (PROBIOTIC ACIDOPHILUS PO)    Take by mouth    LORAZEPAM (ATIVAN) 0.5 MG TABLET    TAKE 1 TABLET BY MOUTH NIGHTLY AS NEEDED FOR ANXIETY    MULTIPLE VITAMINS-MINERALS (THERAPEUTIC MULTIVITAMIN-MINERALS) TABLET    Take 1 tablet by mouth daily    POLYVINYL ALCOHOL-POVIDONE (HYPOTEARS) 1.4-0.6 % OPHTHALMIC SOLUTION    Place 1-2 drops into both eyes as needed.       ALLERGIES     is allergic to morphine; pcn [penicillins]; zithromax [azithromycin]; and adhesive tape.      Diagnostic Results       LABS:   No results found for this visit on 12/21/18.    RADIOLOGY:  CT LUMBAR SPINE WO CONTRAST   Final Result   1. Acute, minimally compressed L1 compression fracture.  No retropulsion of   the posterior wall.   2. Status post L4/L5 posterior fusion.  No evidence of hardware complication.  RECENT VITALS:  BP: 114/65, Temp: 98.8 ??F (37.1 ??C), Pulse: 61, Resp: 20     ED Course     The patient was given the following medications:  Orders Placed This Encounter   Medications   ??? oxyCODONE-acetaminophen (PERCOCET) 5-325 MG per tablet 1 tablet   ??? bacitracin zinc ointment   ??? ketorolac (TORADOL) injection 15 mg       Medical Decision Making           Patient was signed out to me pending results of CT scan, CT scan revealed an L1 compression fracture.  I went back and reevaluate the patient and she was still in significant pain despite opiate pain medication.  She lives alone, she does not normally use a cane or walker so she does not have any assistance device.  She is in so much pain, she cannot reasonably be expected  to perform any of her normal activities of daily living.  I do think she will need to be admitted for pain control, possible evaluation by PT/OT, further pain control and disposition.  Will contact [facility]    EMERGENCY DEPARTMENT COURSE:   Vitals:    Vitals:    12/21/18 1844 12/21/18 1932   BP: (!) 168/82 114/65   Pulse: 67 61   Resp: 16 20   Temp: 98.8 ??F (37.1 ??C)    TempSrc: Tympanic    SpO2: 93% 93%   Weight: 143 lb (64.9 kg)    Height: 5\' 4"  (1.626 m)      -------------------------  BP: 114/65, Temp: 98.8 ??F (37.1 ??C), Pulse: 61, Resp: 20      RE-EVALUATION:  In pain    CONSULTS:  Hospitalist    PROCEDURES:  None    FINAL IMPRESSION      1. Closed compression fracture of L1 vertebra, initial encounter ([place])    2. Intractable pain          DISPOSITION/PLAN   DISPOSITION Decision To Admit 12/21/2018 07:47:30 PM      CONDITION ON DISPOSITION:   Stable    PATIENT REFERRED TO:  No follow-up provider specified.    DISCHARGE MEDICATIONS:  New Prescriptions    No medications on file       (Please note that portions of this note were completed with a voicerecognition program.  Efforts were made to edit the dictations but occasionally words are mis-transcribed.)    Kirstie Peri,, MD, F.A.C.E.P.  Attending Emergency Medicine Physician                     Kirstie Peri, DO  12/21/18 1951

## 2018-12-21 NOTE — ED Provider Notes (Signed)
[facility]  [place]  [place] [address]  Phone: [phone]        Pt Name: Katie Juarez  MRN: [id number]  [place] 1940-05-22  Date of evaluation: 12/21/18      CHIEF COMPLAINT     Chief Complaint   Patient presents with   ??? Fall     "was stepping over a gate and fell onto carpeted floor, was twisting when I went down" denies LOC or hitting head   ??? Back Pain     with fall         HISTORY OF PRESENT ILLNESS  (Location/Symptom, Timing/Onset, Context/Setting, Quality, Duration, Modifying Factors, Severity.)    Katie Juarez is a 78 y.o. female who presents with low back pain.  This 78 year old female is brought by EMS with a complaint of low back pain the patient states she was stepping over a dog gate when she twisted and fell injuring her lower back denies striking her head no loss of conscious no headache no neck pain no chest pain no shortness of breath no abdominal pain no numbness no weakness she has abrasions to her left lower leg but no complaints of pain to the left lower leg tetanus is up-to-date no numbness no weakness the incident occurred just prior to arrival EMS brings her to our facility for evaluation she has not taken anything for the pain      REVIEW OF SYSTEMS    (2-9 systems for level 4, 10 or more for level 5)     Review of Systems   Musculoskeletal: Positive for back pain.   Skin: Positive for wound.   All other systems reviewed and are negative.      PAST MEDICAL HISTORY    has a past medical history of Basal cell carcinoma (BCC) of face, Breast cancer ([place]), DDD (degenerative disc disease), Large bowel obstruction ([place]), LBP (low back pain), and SS (spinal stenosis).    SURGICAL HISTORY      has a past surgical history that includes Hysterectomy (1980); bladder suspension (1990); Cholecystectomy (1990); Breast lumpectomy (Right, 1999); other surgical history (11/14/13, 06/23/11, 06/30/11, 07/12/11 12/29/11); other surgical history (12/17/13); other surgical  history (Bilateral, 05/30/14); other surgical history (Bilateral, 09/16/2014); other surgical history (Bilateral, 09/26/2014); other surgical history (10/10/2014); other surgical history (10/31/2014); Tonsillectomy (1946); Appendectomy (1956); Small intestine surgery (02/20/2015); Femoral hernia repair (Left, 02/20/2015); and Excision/Biopsy (Left, 11/01/2018).    CURRENTMEDICATIONS       Previous Medications    ALBUTEROL SULFATE HFA (VENTOLIN HFA) 108 (90 BASE) MCG/ACT INHALER    Ventolin HFA 90 mcg/actuation aerosol inhaler   Inhale 2 puffs every 6 hours by inhalation route as needed.   ASTHMA    ATORVASTATIN (LIPITOR) 20 MG TABLET    Take 1 tablet by mouth daily    BUDESONIDE-FORMOTEROL (SYMBICORT) 160-4.5 MCG/ACT AERO    Inhale 2 puffs into the lungs 2 times daily Rinse mouth or brush teeth after each use.    CITALOPRAM (CELEXA) 20 MG TABLET    Take 1 tablet by mouth daily    DOCUSATE SODIUM (COLACE) 100 MG CAPSULE    Take 100 mg by mouth nightly     DOXYCYCLINE HYCLATE (VIBRAMYCIN) 100 MG CAPSULE    TAKE 1 CAPSULE BY MOUTH TWICE DAILY FOR 7 DAYS    FLUTICASONE (FLONASE) 50 MCG/ACT NASAL SPRAY    1 spray by Nasal route daily    KETOCONAZOLE (NIZORAL) 2 % SHAMPOO  Use to shampoo hair 3x's weekly for 4 weeks. Keep lather on for 10 minutes before rinsing.    LACTOBACILLUS (PROBIOTIC ACIDOPHILUS PO)    Take by mouth    LORAZEPAM (ATIVAN) 0.5 MG TABLET    TAKE 1 TABLET BY MOUTH NIGHTLY AS NEEDED FOR ANXIETY    MULTIPLE VITAMINS-MINERALS (THERAPEUTIC MULTIVITAMIN-MINERALS) TABLET    Take 1 tablet by mouth daily    POLYVINYL ALCOHOL-POVIDONE (HYPOTEARS) 1.4-0.6 % OPHTHALMIC SOLUTION    Place 1-2 drops into both eyes as needed.       ALLERGIES     is allergic to morphine; pcn [penicillins]; zithromax [azithromycin]; and adhesive tape.    FAMILY HISTORY     She indicated that her mother is deceased. She indicated that her father is deceased. She indicated that two of her five sisters are alive. She indicated that two of  her five brothers are alive. She indicated that the status of her maternal grandfather is unknown.     family history includes Alzheimer's Disease in her sister; Cancer in her brother and sister; Dementia in her sister; Diabetes in her maternal grandfather; Lupus in her sister; Other in her brother, brother, and mother; Stroke in her father.    SOCIAL HISTORY      reports that she quit smoking about 30 years ago. She started smoking about 65 years ago. She has a 40.00 pack-year smoking history. She has never used smokeless tobacco. She reports that she does not drink alcohol or use drugs.    PHYSICAL EXAM    (up to 7 for level 4, 8 or more for level 5)   INITIAL VITALS:  height is 5\' 4"  (1.626 m) and weight is 143 lb (64.9 kg). Her tympanic temperature is 98.8 ??F (37.1 ??C). Her blood pressure is 168/82 (abnormal) and her pulse is 67. Her respiration is 16 and oxygen saturation is 93%.      Physical Exam  Vitals signs and nursing note reviewed.   Constitutional:       Comments: Mild distress secondary to low back pain   HENT:      Head: Normocephalic and atraumatic.   Eyes:      Conjunctiva/sclera: Conjunctivae normal.   Neck:      Musculoskeletal: Normal range of motion and neck supple.      Comments: No posterior neck pain to palpation flexion extension or rotation of the neck  Cardiovascular:      Rate and Rhythm: Normal rate and regular rhythm.      Pulses: Normal pulses.      Heart sounds: Normal heart sounds.   Pulmonary:      Effort: Pulmonary effort is normal. No respiratory distress.      Breath sounds: Normal breath sounds. No stridor. No wheezing, rhonchi or rales.   Chest:      Chest wall: No tenderness.   Abdominal:      General: Bowel sounds are normal. There is no distension.      Palpations: Abdomen is soft.      Tenderness: There is no abdominal tenderness. There is no right CVA tenderness, left CVA tenderness or guarding.   Musculoskeletal:      Comments: Patient is noted to have tenderness in the  lumbar region there is no cervical or thoracic area discomfort no other bony point tenderness appreciated she does have abrasions to the left lower leg without any discomfort   Skin:     Comments: Abrasions left lower leg otherwise without further  rashes or lesions   Neurological:      General: No focal deficit present.      Mental Status: She is alert.      Comments: GCS of 15 with no focal deficits appreciated         DIFFERENTIAL DIAGNOSIS/ MDM:     I will give the patient a Percocet tablet and get a CT of the lumbar spine    DIAGNOSTIC RESULTS         RADIOLOGY:  Non-plain film images such as CT, Ultrasound and MRI are read by the radiologist. Plain radiographic images are visualized and the radiologist interpretations are reviewed as follows:         Interpretation per the Radiologist below, if available at the time of this note:    Pending    LABS:  No results found for this visit on 12/21/18.        EMERGENCY DEPARTMENT COURSE:   Vitals:    Vitals:    12/21/18 1844   BP: (!) 168/82   Pulse: 67   Resp: 16   Temp: 98.8 ??F (37.1 ??C)   TempSrc: Tympanic   SpO2: 93%   Weight: 143 lb (64.9 kg)   Height: 5\' 4"  (1.626 m)     -------------------------  BP: (!) 168/82, Temp: 98.8 ??F (37.1 ??C), Pulse: 67, Resp: 16          CONSULTS:  Dr. Nicki Reaper will take over care of the patient per shift change    PROCEDURES:  None    FINAL IMPRESSION    No diagnosis found.      DISPOSITION/PLAN   DISPOSITION          PATIENT REFERRED TO:  No follow-up provider specified.    DISCHARGE MEDICATIONS:  New Prescriptions    No medications on file       (Please note that portions of this note were completed with a voicerecognition program.  Efforts were made to edit the dictations but occasionally words are mis-transcribed.)    Louie Casa,, MD, F.A.C.E.P.  Attending Emergency Medicine Physician       Louie Casa, MD  12/21/18 [phone]

## 2018-12-22 LAB — CBC
Hematocrit: 39.7 % (ref 36.3–47.1)
Hematocrit: 37.8 % (ref 36.3–47.1)
Hemoglobin: 12.4 g/dL (ref 11.9–15.1)
Hemoglobin: 11.8 g/dL — ABNORMAL LOW (ref 11.9–15.1)
MCH: 28.3 pg (ref 25.2–33.5)
MCH: 28.3 pg (ref 25.2–33.5)
MCHC: 31.2 g/dL (ref 25.2–33.5)
MCHC: 31.2 g/dL (ref 25.2–33.5)
MCV: 90.6 fL (ref 82.6–102.9)
MCV: 90.6 fL (ref 82.6–102.9)
MPV: 10.6 fL (ref 8.1–13.5)
MPV: 10.6 fL (ref 8.1–13.5)
NRBC Automated: 0 per 100 WBC
NRBC Automated: 0 per 100 WBC
Platelets: 194 10*3/uL (ref 138–453)
Platelets: 156 10*3/uL (ref 138–453)
RBC: 4.38 m/uL (ref 3.95–5.11)
RBC: 4.17 m/uL (ref 3.95–5.11)
RDW: 15.7 % — ABNORMAL HIGH (ref 11.8–14.4)
RDW: 15.7 % — ABNORMAL HIGH (ref 11.8–14.4)
WBC: 12.3 10*3/uL — ABNORMAL HIGH (ref 3.5–11.3)
WBC: 10.3 10*3/uL (ref 3.5–11.3)

## 2018-12-22 LAB — BASIC METABOLIC PANEL
Anion Gap: 9 mmol/L (ref 9–17)
BUN: 19 mg/dL (ref 8–23)
Bun/Cre Ratio: 25 — ABNORMAL HIGH (ref 9–20)
CO2: 25 mmol/L (ref 20–31)
Calcium: 9.5 mg/dL (ref 8.6–10.4)
Chloride: 102 mmol/L (ref 98–107)
Creatinine: 0.77 mg/dL (ref 0.50–0.90)
GFR African American: >60 mL/min (ref >60)
GFR Non-African American: >60 mL/min (ref >60)
Glucose: 132 mg/dL — ABNORMAL HIGH (ref 70–99)
Potassium: 4.3 mmol/L (ref 3.7–5.3)
Sodium: 136 mmol/L (ref 135–144)

## 2018-12-22 LAB — BASIC METABOLIC PANEL W/ REFLEX TO MG FOR LOW K
Anion Gap: 9 mmol/L (ref 9–17)
BUN: 21 mg/dL (ref 8–23)
Bun/Cre Ratio: 25 — ABNORMAL HIGH (ref 9–20)
CO2: 26 mmol/L (ref 20–31)
Calcium: 8.9 mg/dL (ref 8.6–10.4)
Chloride: 107 mmol/L (ref 98–107)
Creatinine: 0.84 mg/dL (ref 0.50–0.90)
GFR African American: >60 mL/min (ref >60)
GFR Non-African American: >60 mL/min (ref >60)
Glucose: 100 mg/dL — ABNORMAL HIGH (ref 70–99)
Potassium: 4.3 mmol/L (ref 3.7–5.3)
Sodium: 142 mmol/L (ref 135–144)

## 2018-12-22 LAB — PROTIME-INR
INR: 1
Protime: 13 s (ref 11.5–14.2)

## 2018-12-22 LAB — COVID-19: SARS-CoV-2, Rapid: NOT DETECTED

## 2018-12-22 MED ORDER — CITALOPRAM HYDROBROMIDE 20 MG PO TABS
20 MG | Freq: Every day | ORAL | Status: DC
Start: 2018-12-22 — End: 2018-12-22
  Administered 2018-12-22: 05:00:00 20 mg via ORAL

## 2018-12-22 MED ORDER — NORMAL SALINE FLUSH 0.9 % IV SOLN
0.9 | INTRAVENOUS | Status: DC | PRN
Start: 2018-12-22 — End: 2018-12-22

## 2018-12-22 MED ORDER — OXYCODONE-ACETAMINOPHEN 5-325 MG PO TABS
5-325 MG | ORAL | Status: DC | PRN
Start: 2018-12-22 — End: 2018-12-22

## 2018-12-22 MED ORDER — NICOTINE 21 MG/24HR TD PT24
21 | Freq: Every day | TRANSDERMAL | Status: DC | PRN
Start: 2018-12-22 — End: 2018-12-22

## 2018-12-22 MED ORDER — KETOROLAC TROMETHAMINE 30 MG/ML IJ SOLN
30 MG/ML | Freq: Once | INTRAMUSCULAR | Status: AC
Start: 2018-12-22 — End: 2018-12-21
  Administered 2018-12-22: 01:00:00 15 mg via INTRAVENOUS

## 2018-12-22 MED ORDER — LORAZEPAM 0.5 MG PO TABS
0.5 MG | Freq: Every evening | ORAL | Status: DC | PRN
Start: 2018-12-22 — End: 2018-12-22
  Administered 2018-12-22: 05:00:00 0.5 mg via ORAL

## 2018-12-22 MED ORDER — ENOXAPARIN SODIUM 40 MG/0.4ML SC SOLN
40 MG/0.4ML | Freq: Every day | SUBCUTANEOUS | Status: DC
Start: 2018-12-22 — End: 2018-12-22
  Administered 2018-12-22: 13:00:00 40 mg via SUBCUTANEOUS

## 2018-12-22 MED ORDER — ACETAMINOPHEN 650 MG RE SUPP
650 MG | Freq: Four times a day (QID) | RECTAL | Status: DC | PRN
Start: 2018-12-22 — End: 2018-12-22

## 2018-12-22 MED ORDER — OXYCODONE-ACETAMINOPHEN 5-325 MG PO TABS
5-325 MG | ORAL | Status: DC | PRN
Start: 2018-12-22 — End: 2018-12-22
  Administered 2018-12-22 (×3): 1 via ORAL

## 2018-12-22 MED ORDER — NORMAL SALINE FLUSH 0.9 % IV SOLN
0.9 % | Freq: Two times a day (BID) | INTRAVENOUS | Status: DC
Start: 2018-12-22 — End: 2018-12-22
  Administered 2018-12-22 (×2): 10 mL via INTRAVENOUS

## 2018-12-22 MED ORDER — POTASSIUM CHLORIDE 10 MEQ/100ML IV SOLN
10 MEQ/0ML | INTRAVENOUS | Status: DC | PRN
Start: 2018-12-22 — End: 2018-12-22

## 2018-12-22 MED ORDER — ACETAMINOPHEN 325 MG PO TABS
325 MG | Freq: Four times a day (QID) | ORAL | Status: DC | PRN
Start: 2018-12-22 — End: 2018-12-22

## 2018-12-22 MED ORDER — POLYETHYLENE GLYCOL 3350 17 G PO PACK
17 g | Freq: Every day | ORAL | Status: DC | PRN
Start: 2018-12-22 — End: 2018-12-22

## 2018-12-22 MED ORDER — MAGNESIUM SULFATE IN D5W 1-5 GM/100ML-% IV SOLN
1-5 GM/100ML-% | INTRAVENOUS | Status: DC | PRN
Start: 2018-12-22 — End: 2018-12-22

## 2018-12-22 MED ORDER — POTASSIUM CHLORIDE CRYS ER 20 MEQ PO TBCR
20 MEQ | ORAL | Status: DC | PRN
Start: 2018-12-22 — End: 2018-12-22

## 2018-12-22 MED ORDER — BACITRACIN ZINC 500 UNIT/GM EX OINT
500 UNIT/GM | Freq: Once | CUTANEOUS | Status: AC
Start: 2018-12-22 — End: 2018-12-21
  Administered 2018-12-22: 01:00:00 1 via TOPICAL

## 2018-12-22 MED ORDER — PROMETHAZINE HCL 25 MG PO TABS
25 MG | Freq: Four times a day (QID) | ORAL | Status: DC | PRN
Start: 2018-12-22 — End: 2018-12-22

## 2018-12-22 MED ORDER — POTASSIUM BICARB-CITRIC ACID 20 MEQ PO TBEF
20 MEQ | ORAL | Status: DC | PRN
Start: 2018-12-22 — End: 2018-12-22

## 2018-12-22 MED ORDER — DOCUSATE SODIUM 100 MG PO CAPS
100 MG | Freq: Every evening | ORAL | Status: DC
Start: 2018-12-22 — End: 2018-12-22

## 2018-12-22 MED ORDER — ONDANSETRON HCL 4 MG/2ML IJ SOLN
4 MG/2ML | Freq: Four times a day (QID) | INTRAMUSCULAR | Status: DC | PRN
Start: 2018-12-22 — End: 2018-12-22

## 2018-12-22 MED ORDER — OXYCODONE-ACETAMINOPHEN 5-325 MG PO TABS
5-325 MG | ORAL_TABLET | Freq: Three times a day (TID) | ORAL | 0 refills | Status: DC | PRN
Start: 2018-12-22 — End: 2018-12-31

## 2018-12-22 MED ORDER — ATORVASTATIN CALCIUM 10 MG PO TABS
10 MG | Freq: Every day | ORAL | Status: DC
Start: 2018-12-22 — End: 2018-12-22
  Administered 2018-12-22: 13:00:00 20 mg via ORAL

## 2018-12-22 MED FILL — OXYCODONE-ACETAMINOPHEN 5-325 MG PO TABS: 5-325 mg | ORAL | Qty: 1

## 2018-12-22 MED FILL — BACITRACIN ZINC 500 UNIT/GM EX OINT: 500 UNIT/GM | CUTANEOUS | Qty: 2

## 2018-12-22 MED FILL — KETOROLAC TROMETHAMINE 30 MG/ML IJ SOLN: 30 mg/mL | INTRAMUSCULAR | Qty: 1

## 2018-12-22 MED FILL — ATORVASTATIN CALCIUM 10 MG PO TABS: 10 mg | ORAL | Qty: 2

## 2018-12-22 MED FILL — LORAZEPAM 0.5 MG PO TABS: 0.5 mg | ORAL | Qty: 1

## 2018-12-22 MED FILL — ENOXAPARIN SODIUM 40 MG/0.4ML SC SOLN: 40 MG/0.4ML | SUBCUTANEOUS | Qty: 0.4

## 2018-12-22 MED FILL — CITALOPRAM HYDROBROMIDE 20 MG PO TABS: 20 mg | ORAL | Qty: 1

## 2018-12-22 NOTE — Progress Notes (Addendum)
Discharge instructions reviewed with patient. All questions addressed and answered. PIV removed. Gauze and Coban in place. Daughter en route to pick patient up.

## 2018-12-24 NOTE — Telephone Encounter (Signed)
Pts daughter called and is requesting a call back at the above number.    Pt is having touble with BMs.  Family is also asking if KB would like an MRI

## 2018-12-24 NOTE — Telephone Encounter (Signed)
Pt calling back stating she was to have an MRI today but hasn't heard anything, now pt requesting this to be scheduled tomorrow AM.

## 2018-12-24 NOTE — Telephone Encounter (Addendum)
Called [name] back- told her we'll need to see her to order an MRI.  She'll try the Fleet's & call back if she has any further problems before next week's appt.     [facility] Initial Follow Up Call    Outreach made within 2 business days of discharge: Yes    Patient: Katie Juarez Patient DOB: Aug 22, 1940   MRN: [id number]  Reason for Admission: closed compression fx L1  Discharge Date: 12/22/2018       Spoke with: Katie Juarez    Discharge department/facility: [facility]    TCM Interactive Patient Contact:  Was patient able to fill all prescriptions: Yes  Was patient instructed to bring all medications to the follow-up visit: Yes  Is patient taking all medications as directed in the discharge summary? Yes  Does patient understand their discharge instructions: Yes  Does patient have questions or concerns that need addressed prior to 7-14 day follow up office visit: yes - see notes regarding constipation    Scheduled appointment with PCP within 7-14 days    Follow Up  Future Appointments   Date Time Provider [place]   12/31/2018 11:20 AM [name], DO [facility]       Houston Siren, RN

## 2018-12-24 NOTE — Telephone Encounter (Signed)
Patient dc from PCU on 12/22/18 with dx of closed compression fracture of body of L1 vertebra

## 2018-12-24 NOTE — Telephone Encounter (Signed)
Spoke with [name] was in the background .  Katie Juarez says her last BM was Dec 11 but feels it's due to constipation from the [place].  Is using Miralax, Docusate stool softener, and a fiber supplement.  Advised a home Fleet's enema may be of benefit. Says Dr Dorena Cookey wanted to do an MRI while she was in the hospital but [name] wanted to go home.  So now she wants you to order an MRI.  Also says her right arm is falling asleep but that was happening before the fall.  Has an appt next Monday.  [name] would like a call back from you this evening or tomorrow on her cell [phone].

## 2018-12-26 NOTE — Telephone Encounter (Signed)
Pt's daughter called in asking what should be done for pt's constipation as she has not had a BM for 6 days. Daughter stated that pt is uncomfortable and at this time is still taking percocet 3x/day d/t back injury. Daughter was told that note would be forwarded to on call provider as KB is not in office today. Enemas and OTC medications have not helped this far. Can you please advise? There is a previous note from 12/24/18 as well. Writer can call daughter back if addressed tonight.

## 2018-12-26 NOTE — Telephone Encounter (Signed)
I would recommend patient come in for evaluation.

## 2018-12-26 NOTE — Telephone Encounter (Signed)
Spoke with daughter regarding evaluation. Daughter said that patient was currently making a BM. Advised that since she is progressing, that she continues to push water as other things that she drinks can cause constipation. If sx get worse again or feels like she is still not having normal BMs, to come and be evaluated. Daughter verbalized understanding and agreed.

## 2018-12-26 NOTE — Telephone Encounter (Signed)
Please call pt back and see if she has been able to move her bowels since phone call on 12/14.  If so, would continue to try to decrease Percocet dosing as tolerated, and continue Miralax and stool softeners as needed until BM's return to normal frequency/softness.      Will discuss ordering MRI per pt's request with her at appt in 5 days, as previously noted.

## 2018-12-26 NOTE — Telephone Encounter (Signed)
Son in law calling stating he spoke to [facility] office on Monday about pt's constipation and they declined anything at that time, Nicole Kindred calling today requesting something to be called to loaded pharmacy, please advise at above number.

## 2018-12-26 NOTE — Telephone Encounter (Signed)
Patient's family called back, upset nothing has been called in for [place] yet.  Did explain that the previous note will be addressed as soon as possible.

## 2018-12-26 NOTE — Telephone Encounter (Signed)
Pt son in law requesting medication for pt constipation to be called in

## 2018-12-27 ENCOUNTER — Inpatient Hospital Stay
Admit: 2018-12-27 | Discharge: 2018-12-28 | Disposition: A | Discharge status: Home / Self Care | Payer: MEDICARE | Attending: Emergency Medicine

## 2018-12-27 ENCOUNTER — Emergency Department: Payer: MEDICARE | Primary: Family Medicine

## 2018-12-27 ENCOUNTER — Emergency Department: Admit: 2018-12-27 | Payer: MEDICARE | Primary: Family Medicine

## 2018-12-27 DIAGNOSIS — K5903 Drug induced constipation: Secondary | ICD-10-CM

## 2018-12-27 LAB — COMPREHENSIVE METABOLIC PANEL
ALT: 28 U/L (ref 5–33)
AST: 27 U/L (ref <32)
Albumin/Globulin Ratio: 1.3 (ref 1.0–2.5)
Albumin: 3.9 g/dL (ref 3.5–5.2)
Alkaline Phosphatase: 68 U/L (ref 35–104)
Anion Gap: 11 mmol/L (ref 9–17)
BUN: 33 mg/dL — ABNORMAL HIGH (ref 8–23)
Bun/Cre Ratio: 28 — ABNORMAL HIGH (ref 9–20)
CO2: 26 mmol/L (ref 20–31)
Calcium: 10.1 mg/dL (ref 8.6–10.4)
Chloride: 103 mmol/L (ref 98–107)
Creatinine: 1.18 mg/dL — ABNORMAL HIGH (ref 0.50–0.90)
GFR African American: 54 mL/min — ABNORMAL LOW (ref >60)
GFR Non-African American: 44 mL/min — ABNORMAL LOW (ref >60)
Glucose: 99 mg/dL (ref 70–99)
Potassium: 4.1 mmol/L (ref 3.7–5.3)
Sodium: 140 mmol/L (ref 135–144)
Total Bilirubin: 0.25 mg/dL — ABNORMAL LOW (ref 0.3–1.2)
Total Protein: 6.9 g/dL (ref 6.4–8.3)

## 2018-12-27 LAB — CBC WITH AUTO DIFFERENTIAL
Absolute Eos #: 0.17 10*3/uL (ref 0.00–0.44)
Absolute Immature Granulocyte: 0.09 10*3/uL (ref 0.00–0.30)
Absolute Lymph #: 0.99 10*3/uL — ABNORMAL LOW (ref 1.10–3.70)
Absolute Mono #: 1.06 10*3/uL (ref 0.10–1.20)
Basophils Absolute: 0.05 10*3/uL (ref 0.00–0.20)
Basophils: 1 % (ref 0–2)
Eosinophils %: 2 % (ref 1–4)
Hematocrit: 35.9 % — ABNORMAL LOW (ref 36.3–47.1)
Hemoglobin: 11.3 g/dL — ABNORMAL LOW (ref 11.9–15.1)
Immature Granulocytes: 1 % — ABNORMAL HIGH
Lymphocytes: 11 % — ABNORMAL LOW (ref 24–43)
MCH: 28.1 pg (ref 25.2–33.5)
MCHC: 31.5 g/dL (ref 25.2–33.5)
MCV: 89.3 fL (ref 82.6–102.9)
MPV: 10 fL (ref 8.1–13.5)
Monocytes: 12 % (ref 3–12)
NRBC Automated: 0 per 100 WBC
Platelets: 192 10*3/uL (ref 138–453)
RBC: 4.02 m/uL (ref 3.95–5.11)
RDW: 16.1 % — ABNORMAL HIGH (ref 11.8–14.4)
Seg Neutrophils: 73 % — ABNORMAL HIGH (ref 36–65)
Segs Absolute: 6.75 10*3/uL (ref 1.50–8.10)
WBC: 9.1 10*3/uL (ref 3.5–11.3)

## 2018-12-27 MED ORDER — SODIUM CHLORIDE 0.9 % IV BOLUS
0.9 % | Freq: Once | INTRAVENOUS | Status: AC
Start: 2018-12-27 — End: 2018-12-27
  Administered 2018-12-27: 23:00:00 1000 mL via INTRAVENOUS

## 2018-12-27 MED ORDER — IOPAMIDOL 76 % IV SOLN
76 % | Freq: Once | INTRAVENOUS | Status: AC | PRN
Start: 2018-12-27 — End: 2018-12-27
  Administered 2018-12-27: 23:00:00 75 mL via INTRAVENOUS

## 2018-12-27 NOTE — ED Provider Notes (Addendum)
ATTENDING  ADDENDUM     Care of this patient was assumed from Dr. Dorice Lamas the patient was seen for Constipation and Back Pain  .  The patient's initial evaluation and plan have been discussed with the prior provider who initially evaluated the patient.  Nursing Notes, Past Medical Hx, Past Surgical Hx, Social Hx, Allergies, and Family Hx were all reviewed.      Reevaluation:  When the patient was signed out to me we are waiting for the results of a 5 milk molasses enema that the patient had been given.  Looking at the patient's CAT scan most of her problem seems to be just lots of liquid stool there does not appear to be a great deal of solid stool on the scan.  Patient did have liquid results from her enemas and she feels as though she is emptying.  This point time I believe the patient is stable for discharge home to follow-up with the family physician closely regarding whether or not she should continue her narcotic pain medicines and when she can continue laxatives.        DIFFERENTIAL DIAGNOSIS/ MDM:           Follow Exit Care instructions closely.    I have reviewed the disposition diagnosis with the patient and or their family/guardian. I have answered their questions and given discharge instructions. They voiced understanding of these instructions and did not have any further questions or complaints.    DIAGNOSTIC RESULTS     RADIOLOGY:   Non-plain film images such as CT, Ultrasound and MRI are read by the radiologist. Plain radiographic images are visualized and preliminarily interpreted by the emergency physician with the below findings:  CT ABDOMEN PELVIS W IV CONTRAST   Final Result   1. Scattered colonic diverticula, without evidence of diverticulitis   2. Large amount of liquid appearing stool present within the colon,   consistent with diarrhea.   3. 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             LABS:  Results for orders placed or performed during the hospital  encounter of 12/27/18   CBC Auto Differential   Result Value Ref Range    WBC 9.1 3.5 - 11.3 k/uL    RBC 4.02 3.95 - 5.11 m/uL    Hemoglobin 11.3 (L) 11.9 - 15.1 g/dL    Hematocrit 40.1 (L) 36.3 - 47.1 %    MCV 89.3 82.6 - 102.9 fL    MCH 28.1 25.2 - 33.5 pg    MCHC 31.5 25.2 - 33.5 g/dL    RDW 02.7 (H) 25.3 - 14.4 %    Platelets 192 138 - 453 k/uL    MPV 10.0 8.1 - 13.5 fL    NRBC Automated 0.0 0.0 per 100 WBC    Differential Type NOT REPORTED     Seg Neutrophils 73 (H) 36 - 65 %    Lymphocytes 11 (L) 24 - 43 %    Monocytes 12 3 - 12 %    Eosinophils % 2 1 - 4 %    Basophils 1 0 - 2 %    Immature Granulocytes 1 (H) 0 %    Segs Absolute 6.75 1.50 - 8.10 k/uL    Absolute Lymph # 0.99 (L) 1.10 - 3.70 k/uL    Absolute Mono # 1.06 0.10 - 1.20 k/uL    Absolute Eos # 0.17 0.00 - 0.44 k/uL    Basophils Absolute 0.05  0.00 - 0.20 k/uL    Absolute Immature Granulocyte 0.09 0.00 - 0.30 k/uL    WBC Morphology NOT REPORTED     RBC Morphology ANISOCYTOSIS PRESENT     Platelet Estimate NOT REPORTED    Comprehensive Metabolic Panel   Result Value Ref Range    Glucose 99 70 - 99 mg/dL    BUN 33 (H) 8 - 23 mg/dL    CREATININE 9.62 (H) 0.50 - 0.90 mg/dL    Bun/Cre Ratio 28 (H) 9 - 20    Calcium 10.1 8.6 - 10.4 mg/dL    Sodium 952 841 - 324 mmol/L    Potassium 4.1 3.7 - 5.3 mmol/L    Chloride 103 98 - 107 mmol/L    CO2 26 20 - 31 mmol/L    Anion Gap 11 9 - 17 mmol/L    Alkaline Phosphatase 68 35 - 104 U/L    ALT 28 5 - 33 U/L    AST 27 <32 U/L    Total Bilirubin 0.25 (L) 0.3 - 1.2 mg/dL    Total Protein 6.9 6.4 - 8.3 g/dL    Alb 3.9 3.5 - 5.2 g/dL    Albumin/Globulin Ratio 1.3 1.0 - 2.5    GFR Non-African American 44 (L) >60 mL/min    GFR African American 54 (L) >60 mL/min    GFR Comment          GFR Staging NOT REPORTED        ABNORMAL LABS:  Labs Reviewed   CBC WITH AUTO DIFFERENTIAL - Abnormal; Notable for the following components:       Result Value    Hemoglobin 11.3 (*)     Hematocrit 35.9 (*)     RDW 16.1 (*)     Seg Neutrophils  73 (*)     Lymphocytes 11 (*)     Immature Granulocytes 1 (*)     Absolute Lymph # 0.99 (*)     All other components within normal limits   COMPREHENSIVE METABOLIC PANEL - Abnormal; Notable for the following components:    BUN 33 (*)     CREATININE 1.18 (*)     Bun/Cre Ratio 28 (*)     Total Bilirubin 0.25 (*)     GFR Non-African American 44 (*)     GFR African American 54 (*)     All other components within normal limits        EKG:      EMERGENCY DEPARTMENT COURSE:   Vitals:    Vitals:    12/27/18 1730 12/27/18 1855 12/27/18 1900 12/27/18 2100   BP: (!) 141/89 124/83 123/87 126/78   Pulse: 58 60 60 62   Resp: 16 16 16 16    Temp:       SpO2: 95% 95% 93% 95%   Weight:         -------------------------  BP: 126/78, Temp: 97.5 F (36.4 C), Pulse: 62, Resp: 16          FINAL IMPRESSION      1. Drug-induced constipation          DISPOSITION/PLAN   DISPOSITION Decision To Discharge 12/27/2018 09:22:33 PM    I have reviewed the disposition diagnosis with the patient and or their family/guardian.  I have answered their questions and given discharge instructions.  They voiced understanding of these instructions and did not have any further questions or complaints.    Condition on Disposition    improved    PATIENT  REFERRED TO:  Belinda Fisher, DO  [address]  [place] [address]  [phone]    In 2 days        DISCHARGE MEDICATIONS:  Discharge Medication List as of 12/27/2018  9:23 PM          (Please note that portions of this note were completed with a voice recognition program.  Efforts were made to edit the dictations but occasionally words are mis-transcribed.)    Donna Bernard,, MD  Attending Emergency Physician        Donna Bernard, MD  12/31/18 2227       Donna Bernard, MD  12/31/18 [phone]

## 2018-12-27 NOTE — Telephone Encounter (Signed)
Noted and agree.  Note had been written back yesterday at 3:00 pm (prior to pt's family's second call) in previous phone note from 12/14 to call her and check on her status/BM's.  Does not appear call was made.    If pt was able to move her bowels last evening, and is feeling better, would recommend she continue to try to decrease Percocet dosing as tolerated, and continue Miralax and stool softeners daily-BID as needed until BM's return to normal frequency/softness.  Will f/u with pt at her OV in 4 days.    If not improved, would recommend she come in to be evaluated.

## 2018-12-27 NOTE — Other (Signed)
Chaplain rounding in ED    Assessment: Patient lying in bed presenting to be in pain. Patient's daughter sitting at bedside. Patient experienced a fall last week and now is experiencing stomach issues. Patient's main support is her children and church community.     Intervention: Chaplain provided caring presence via active listening, empathetic conversation, and prayer.     Outcome: Patient and daughter thanked chaplain for visit.     Plan: Chaplains will remain available to offer spiritual and emotional support as needed.       12/27/18 2004   Encounter Summary   Services provided to: Patient and family together   Referral/Consult From: Rounding   Place of Worship [facility]   Complexity of Encounter Moderate   Length of Encounter 15 minutes   Spiritual Assessment Completed Yes   Spiritual/Religious   Type Spiritual support   Assessment Approachable   Intervention Active listening;Prayer   Outcome Expressed gratitude   Electronically signed by Luci Bank Giannis [employer]uz on 12/27/2018 at 8:07 PM

## 2018-12-27 NOTE — Discharge Instructions (Addendum)
Initially clear liquid diet advance as tolerated avoid heavy greasy foods and meat.  Return back to emergency setting if worsening anyway pain regimen that we would recommend is 650 mg of Tylenol every 4 hours along with 600 mg of ibuprofen every 6 hours.  These medicines should be taken with food.

## 2018-12-28 MED ORDER — KETOROLAC TROMETHAMINE 30 MG/ML IJ SOLN
30 MG/ML | Freq: Once | INTRAMUSCULAR | Status: AC
Start: 2018-12-28 — End: 2018-12-27
  Administered 2018-12-28: 01:00:00 15 mg via INTRAVENOUS

## 2018-12-28 MED FILL — KETOROLAC TROMETHAMINE 30 MG/ML IJ SOLN: 30 mg/mL | INTRAMUSCULAR | Qty: 1

## 2018-12-28 NOTE — Telephone Encounter (Signed)
There is nothing else I would recommend at this point.  CT scan showed there was not a large amount of solid stool present; only liquid stool.  May only get liquid stool out at this point.  No sign of partial obstruction.  Do not see need for Lactulose at this point.  She is already doing all OTC meds I would recommend, dietary adjustments, and has had multiple enemas.     How is pt eating?  If she is not eating well, she will also see decreased stool output.

## 2018-12-28 NOTE — Telephone Encounter (Signed)
Charting continued in another phone enc.

## 2018-12-28 NOTE — Telephone Encounter (Signed)
Called son-in-law Nicole Kindred he stated pt went to ER here at [facility], 12/27/18 they did a molasses and water flush didn't bring much out, had 2 ct scans pt was full, Pt stopped taking the percocet's 2 days ago, and is taking tylenol at this time, also taking Miralax for constipation trying to drink water and eating more fruit, asking if there is anything else pt can do. Ptjust having liquid stool today not getting much out.

## 2018-12-31 ENCOUNTER — Ambulatory Visit
Admit: 2018-12-31 | Discharge: 2018-12-31 | Payer: MEDICARE | Attending: Physical Medicine & Rehabilitation | Primary: Family Medicine

## 2018-12-31 ENCOUNTER — Ambulatory Visit: Admit: 2018-12-31 | Discharge: 2018-12-31 | Payer: MEDICARE | Attending: Family Medicine | Primary: Family Medicine

## 2018-12-31 DIAGNOSIS — S32010A Wedge compression fracture of first lumbar vertebra, initial encounter for closed fracture: Secondary | ICD-10-CM

## 2018-12-31 MED ORDER — TRAMADOL HCL 50 MG PO TABS
50 MG | ORAL_TABLET | Freq: Three times a day (TID) | ORAL | 0 refills | Status: AC | PRN
Start: 2018-12-31 — End: 2019-01-07

## 2018-12-31 NOTE — Progress Notes (Signed)
PAIN MANAGEMENTNEW PATIENT CONSULTATION  12/31/18    Katie Juarez Katie Juarez  1940-04-29    ReferringProvider:  [name], [name] [address]    Primary Care Physician:  Katie Peat, DO  Katie [place]  [place] [address]    HPIinformation obtained from the patient as well as the Comprehensive Pain ManagementHealth Questionnaire filled out by the patient. The questionnaire will be scannedinto the electronic chart and PMH, PSH, meds, and allergies will be updates accordingly.    Subjective:       CHIEF COMPLAINT:  This is a78 y.o. female patientwho presents with a complaint of Back Pain (fell over barricade for the dog and fell backwards; had imediate pain.  Happened on 12/21/18) and Other (was on percocet for a couple of day from the ER, percocet caused constipation; 800mg  ibuprofen and 1000mg  tylenol)      PAIN HPI:    New from standing position and landed on  Mid lumbar  Felt pain that  Day 12-21-2018 and has  Had since with even slightness  Back rotation  Twisting flexion movement  Not down either leg       Location: Back mid line spinous process L1 less tender over paraspinals lumbar  T11-L4 mild   Location Modifier: mid over upper lumbar spinous process   Severity of Pain: 8  Duration of Pain: Intermittent  Frequency of Pain: Intermittent  Aggravating Factors: Stretching, Bending, Straightening, Standing, Walking, Stairs, Exercise, Kneeling and Squatting  Limiting Behavior: no  Relieving Factors: Heat, Cold and Medication -  Result of Injury: no  Work Related Injury: no  English as a second language teacher Case: no      Prior evaluation:    Previous lower back problems:No    Previous workup: No   History of surgery in painful area:No    Previous pain medication trials have included:       Opioids: -     NSAID's:-     Muscle relaxants: -     Neuropathicpain meds: -    Previous Pain Management Physician: Yes with [place]  2015-6 for L4,5 radiculopathy and axial pain did not improve from  Interventional  surgeries at time  But pain  Became manageable    Nerve blocks, spinal injections:Yes   Typeof Pain Intervention -Lumbar TFEs, Lumbar MBBs, S-I joint injecction  .    Date of last Pain Intervention     Was there pain relief from Pain Intervention: No.    How long was your pain relief from the Pain Intervention 1weeks.     Sleep:    Sleep disturbance: Yes   Difficultyfalling asleep:  Yes   Feels fatiguedmuch of the time: No   Wakes uprested: No   Awakens in themiddle of the night: No   Takes sleepingmedication: No    Mental health:    Patient feels - secondary to their current pain problems as described above.   H/O depression and anxiety: No   Patient is not seeing psychologist orpsychiatrist   Abuse history?No    Employed?No  Alcohol use?: No  Tobacco use?: No  Marijuana use?: No  Illicit drug use?: No    Imaging: Reviewed available imagingin our system with the patient.  No results found.        Referring physician records reviewed.  Review of Systems   Constitutional: Positive for fatigue.   HENT: Negative.    Eyes: Negative.    Respiratory: Negative.  Cardiovascular: Negative.    Gastrointestinal: Negative for constipation and diarrhea.   Endocrine: Negative.    Genitourinary: Negative for difficulty urinating and flank pain.   Musculoskeletal: Positive for back pain and myalgias.   Skin: Negative.    Allergic/Immunologic: Negative.    Neurological: Positive for weakness and numbness.   Hematological: Negative.    Psychiatric/Behavioral: Positive for sleep disturbance.       Allergies   Allergen Reactions   ??? Morphine Other (See Comments)     Night sweats,felt "loopy".   ??? Pcn [Penicillins] Hives   ??? Zithromax [Azithromycin] Hives   ??? Adhesive Tape Rash       Outpatient Medications Prior to Visit   Medication Sig Dispense Refill   ??? traMADol (ULTRAM) 50 MG tablet Take 0.5-1 tablets by mouth every 8 hours as needed for Pain for up to 7 days. Take lowest dose possible to manage pain 20 tablet 0   ??? LORazepam  (ATIVAN) 0.5 MG tablet TAKE 1 TABLET BY MOUTH NIGHTLY AS NEEDED FOR ANXIETY 90 tablet 0   ??? citalopram (CELEXA) 20 MG tablet Take 1 tablet by mouth daily 90 tablet 3   ??? atorvastatin (LIPITOR) 20 MG tablet Take 1 tablet by mouth daily 90 tablet 0   ??? doxycycline hyclate (VIBRAMYCIN) 100 MG capsule TAKE 1 CAPSULE BY MOUTH TWICE DAILY FOR 7 DAYS     ??? Lactobacillus (PROBIOTIC ACIDOPHILUS PO) Take by mouth     ??? Multiple Vitamins-Minerals (THERAPEUTIC MULTIVITAMIN-MINERALS) tablet Take 1 tablet by mouth daily     ??? ketoconazole (NIZORAL) 2 % shampoo Use to shampoo hair 3x's weekly for 4 weeks. Keep lather on for 10 minutes before rinsing. 1 Bottle 1   ??? budesonide-formoterol (SYMBICORT) 160-4.5 MCG/ACT AERO Inhale 2 puffs into the lungs 2 times daily Rinse mouth or brush teeth after each use. 3 Inhaler 3   ??? albuterol sulfate HFA (VENTOLIN HFA) 108 (90 Base) MCG/ACT inhaler Ventolin HFA 90 mcg/actuation aerosol inhaler   Inhale 2 puffs every 6 hours by inhalation route as needed.   ASTHMA     ??? docusate sodium (COLACE) 100 MG capsule Take 100 mg by mouth nightly      ??? polyvinyl alcohol-povidone (HYPOTEARS) 1.4-0.6 % ophthalmic solution Place 1-2 drops into both eyes as needed.     ??? fluticasone (FLONASE) 50 MCG/ACT nasal spray 1 spray by Nasal route daily 1 Bottle 0     No facility-administered medications prior to visit.        Past Medical History:   Diagnosis Date   ??? Basal cell carcinoma (BCC) of face 10/2018    excised by Dr Katie Juarez    ??? Breast cancer ([place]) 1999    in remission   ??? Constipation 12/27/2018   ??? DDD (degenerative disc disease)    ??? Large bowel obstruction ([place]) 02/2015    [facility]   ??? LBP (low back pain)    ??? SS (spinal stenosis)        Past Surgical History:   Procedure Laterality Date   ??? APPENDECTOMY  1956   ??? BLADDER SUSPENSION  1990   ??? BREAST LUMPECTOMY Right 1999   ??? CHOLECYSTECTOMY  1990   ??? EXCISION/BIOPSY Left 11/01/2018    Dr Katie Juarez Katie Juarez, [facility]   ??? FEMORAL HERNIA REPAIR Left  02/20/2015    strangulated with small bowel resection - [facility] [place]   ??? [place] for  endometriosis; done in [place]   ??? OTHER SURGICAL HISTORY  11/14/13, 06/23/11, 06/30/11, 07/12/11 12/29/11    L4/L5 IESI   ??? OTHER SURGICAL HISTORY  12/17/13    L5/S1 IESI   ??? OTHER SURGICAL HISTORY Bilateral 05/30/14    SIJ and piriformis   ??? OTHER SURGICAL HISTORY Bilateral 09/16/2014    L3, L4, L5 Diagnostic Medial Branch Block   ??? OTHER SURGICAL HISTORY Bilateral 09/26/2014    L5 TFE   ??? OTHER SURGICAL HISTORY  10/10/2014    bil L5 TFE   ??? OTHER SURGICAL HISTORY  10/31/2014    caudal   ??? SMALL INTESTINE SURGERY  02/20/2015    strangulated left femoral hernia   ??? TONSILLECTOMY  1946       Family History   Problem Relation Age of Onset   ??? Other Mother         ALS - diagnosed at age 67   ??? Stroke Father         age 37   ??? Cancer Sister         pt thinks it was liver   ??? Cancer Brother         leukemia   ??? Diabetes Maternal Grandfather    ??? Other Brother         drowned   ??? Other Brother         died at age 36; think due to spinal meningitis   ??? Lupus Sister    ??? Alzheimer's Disease Sister    ??? Dementia Sister      Social History     Socioeconomic History   ??? Marital status: Widowed     Spouse name: None   ??? Number of children: None   ??? Years of education: None   ??? Highest education level: None   Occupational History   ??? None   Social Needs   ??? Financial resource strain: None   ??? Food insecurity     Worry: None     Inability: None   ??? Transportation needs     Medical: None     Non-medical: None   Tobacco Use   ??? Smoking status: Former Smoker     Packs/day: 1.00     Years: 40.00     Pack years: 40.00     Start date: 01/10/1953     Quit date: 01/11/1988     Years since quitting: 30.9   ??? Smokeless tobacco: Never Used   ??? Tobacco comment: quit 25 years ago 1990   Substance and Sexual Activity   ??? Alcohol use: No     Alcohol/week: 0.0 standard drinks   ??? Drug use: Never   ??? Sexual activity:  None   Lifestyle   ??? Physical activity     Days per week: None     Minutes per session: None   ??? Stress: None   Relationships   ??? Social Product manager on phone: None     Gets together: None     Attends religious service: None     Active member of club or organization: None     Attends meetings of clubs or organizations: None     Relationship status: None   ??? Intimate partner violence     Fear of current or ex partner: None     Emotionally abused: None     Physically abused: None     Forced sexual activity: None  Other Topics Concern   ??? None   Social History Narrative   ??? None         Objective:     Physical Exam:  Vitals:    12/31/18 1436   BP: (!) 140/88   Resp: 14   Weight: 144 lb (65.3 kg)   Height: 5\' 4"  (1.626 m)     Pain Score:   9    Physical Exam  Vitals signs and nursing note reviewed.   Constitutional:       General: She is not in acute distress.     Appearance: Normal appearance. She is well-developed. She is not diaphoretic.   HENT:      Head: Normocephalic and atraumatic.      Right Ear: External ear normal. No decreased hearing noted.      Left Ear: External ear normal. No decreased hearing noted.      Nose: Nose normal.   Eyes:      General: Lids are normal. No scleral icterus.     Conjunctiva/sclera: Conjunctivae normal.   Neck:      Trachea: Phonation normal.   Cardiovascular:      Comments: No BLE edema present  Pulmonary:      Effort: Pulmonary effort is normal. No accessory muscle usage or respiratory distress.   Abdominal:      General: Abdomen is Juarez.      Palpations: Abdomen is soft.   Genitourinary:     Comments: deferred  Skin:     General: Skin is warm and dry.      Coloration: Skin is not pale.      Findings: No erythema or rash.   Neurological:      General: No focal deficit present.      Mental Status: She is alert and oriented to person, place, and time.   Psychiatric:         Mood and Affect: Mood normal.         Speech: Speech normal.         Behavior: Behavior normal.          Back Exam     Tenderness   The patient is experiencing tenderness in the lumbar.    Range of Motion   Extension: normal   Flexion: normal   Lateral bend right: normal   Lateral bend left: normal   Rotation right: normal   Rotation left: normal     Muscle Strength   Right Quadriceps:  5/5   Left Quadriceps:  5/5   Right Hamstrings:  5/5   Left Hamstrings:  5/5     Tests   Straight leg raise right: negative  Straight leg raise left: negative    Other   Toe walk: normal  Heel walk: normal  Sensation: normal  Gait: normal     Comments:  Tender  Over spinous process L1  With beginning of rotation  side bending  -kemps  -slump  -fabers   Mild tender paraspinal and over T4 spinous process             Labs:   Lab Results   Component Value Date    WBC 9.1 12/27/2018    HGB 11.3 (L) 12/27/2018    HCT 35.9 (L) 12/27/2018    PLT 192 12/27/2018    CHOL 129 12/03/2018    TRIG 140 12/03/2018    HDL 53 12/03/2018    ALT 28 12/27/2018    AST 27 12/27/2018  NA 140 12/27/2018    K 4.1 12/27/2018    CL 103 12/27/2018    CREATININE 1.18 (H) 12/27/2018    BUN 33 (H) 12/27/2018    CO2 26 12/27/2018    TSH 0.83 07/06/2018    INR 1.0 12/22/2018    LABA1C 6.2 (H) 07/04/2018       Assessment:   This is a 78 y.o. female with the following diagnosis:     Pain Diagnoses:  1. Closed compression fracture of L1 lumbar vertebra, initial encounter ([place])    2. Lumbosacral spondylosis without myelopathy    3. Lumbar radiculopathy    4. Lumbar facet joint syndrome ([name])    5. Postlaminectomy syndrome of lumbosacral region        Medical/ Psychological Comorbidities:  As listed in the past medical and surgical history    Functional Limitations secondary to the above problems:  Chronic painlimits function and quality of life    Plan:     1. MRI Lumbar for Compression Fracture    2. Continue Calcium Vit D   3. Consider Bone Density in future   4.  schedule for L1 Balloon   Kyphoplasty  Taking Tramadol from PCP    Risks explained  Of Surgery  Balloon Kyphoplast    Meds:   New Prescriptions    No medications on file      No orders of the defined types were placed in this encounter.      Controlled Substances Monitoring:    OARRS report was reviewed for [place], [place]. Pt educated about the risks of taking opiates, including increasedsedation, constipation, slowed breathing, tolerance, dependence, and addiction.    No orders of the defined types were placed in this encounter.    No follow-ups on file.    The patient expressed understanding of the above assessment and plan.    Totaltime spent face to face with patient was 30 minutes inwhich  50% or more of the time was spent in counseling, education about risk andbenefits of the above plan, and coordination of care.

## 2018-12-31 NOTE — Telephone Encounter (Signed)
Addressed at today's OV.

## 2019-01-01 ENCOUNTER — Inpatient Hospital Stay: Admit: 2019-01-01 | Payer: MEDICARE | Primary: Family Medicine

## 2019-01-01 DIAGNOSIS — S32010A Wedge compression fracture of first lumbar vertebra, initial encounter for closed fracture: Secondary | ICD-10-CM

## 2019-01-01 NOTE — Telephone Encounter (Signed)
Patient confirmed a covid swab appt for Dec 23rd at [facility].

## 2019-01-02 ENCOUNTER — Inpatient Hospital Stay
Admit: 2019-01-02 | Discharge: 2019-01-03 | Disposition: A | Discharge status: Home / Self Care | Payer: Medicare Other | Source: Other Acute Inpatient Hospital | Attending: Orthopaedic Surgery | Admitting: Orthopaedic Surgery

## 2019-01-02 ENCOUNTER — Inpatient Hospital Stay: Admit: 2019-01-02 | Payer: MEDICARE | Primary: Family Medicine

## 2019-01-02 ENCOUNTER — Inpatient Hospital Stay: Admit: 2019-01-02 | Discharge: 2019-01-02 | Payer: MEDICARE

## 2019-01-02 DIAGNOSIS — M8008XA Age-related osteoporosis with current pathological fracture, vertebra(e), initial encounter for fracture: Secondary | ICD-10-CM

## 2019-01-02 DIAGNOSIS — Z20828 Contact with and (suspected) exposure to other viral communicable diseases: Secondary | ICD-10-CM

## 2019-01-02 MED ORDER — ATORVASTATIN CALCIUM 20 MG PO TABS
20 MG | Freq: Every day | ORAL | Status: DC
Start: 2019-01-02 — End: 2019-01-03

## 2019-01-02 MED ORDER — ENOXAPARIN SODIUM 30 MG/0.3ML SC SOLN
30 | Freq: Two times a day (BID) | SUBCUTANEOUS | Status: DC
Start: 2019-01-02 — End: 2019-01-03

## 2019-01-02 MED ORDER — MAGNESIUM HYDROXIDE 400 MG/5ML PO SUSP
400 | Freq: Every day | ORAL | Status: DC | PRN
Start: 2019-01-02 — End: 2019-01-03

## 2019-01-02 MED ORDER — POLYETHYLENE GLYCOL 3350 17 G PO PACK
17 | Freq: Every day | ORAL | Status: DC
Start: 2019-01-02 — End: 2019-01-03

## 2019-01-02 MED ORDER — ACETAMINOPHEN 325 MG PO TABS
325 | Freq: Four times a day (QID) | ORAL | Status: DC
Start: 2019-01-02 — End: 2019-01-03
  Administered 2019-01-03 (×4): 650 mg via ORAL

## 2019-01-02 MED ORDER — TRAMADOL HCL 50 MG PO TABS
50 | Freq: Four times a day (QID) | ORAL | Status: DC | PRN
Start: 2019-01-02 — End: 2019-01-03
  Administered 2019-01-03 (×3): 50 mg via ORAL

## 2019-01-02 MED ORDER — PROMETHAZINE HCL 12.5 MG PO TABS
12.5 | Freq: Four times a day (QID) | ORAL | Status: DC | PRN
Start: 2019-01-02 — End: 2019-01-03

## 2019-01-02 MED ORDER — NORMAL SALINE FLUSH 0.9 % IV SOLN
0.9 | Freq: Two times a day (BID) | INTRAVENOUS | Status: DC
Start: 2019-01-02 — End: 2019-01-03

## 2019-01-02 MED ORDER — BUDESONIDE-FORMOTEROL FUMARATE 160-4.5 MCG/ACT IN AERO
Freq: Two times a day (BID) | RESPIRATORY_TRACT | Status: DC
Start: 2019-01-02 — End: 2019-01-03
  Administered 2019-01-03 (×2): 2 via RESPIRATORY_TRACT

## 2019-01-02 MED ORDER — OXYCODONE HCL 5 MG PO TABS
5 | ORAL | Status: DC | PRN
Start: 2019-01-02 — End: 2019-01-02

## 2019-01-02 MED ORDER — ONDANSETRON HCL 4 MG/2ML IJ SOLN
4 | Freq: Four times a day (QID) | INTRAMUSCULAR | Status: DC | PRN
Start: 2019-01-02 — End: 2019-01-03

## 2019-01-02 MED ORDER — LORAZEPAM 0.5 MG PO TABS
0.5 MG | Freq: Every evening | ORAL | Status: DC | PRN
Start: 2019-01-02 — End: 2019-01-03

## 2019-01-02 MED ORDER — ALBUTEROL SULFATE (2.5 MG/3ML) 0.083% IN NEBU
RESPIRATORY_TRACT | Status: DC | PRN
Start: 2019-01-02 — End: 2019-01-03

## 2019-01-02 MED ORDER — CITALOPRAM HYDROBROMIDE 20 MG PO TABS
20 MG | Freq: Every day | ORAL | Status: DC
Start: 2019-01-02 — End: 2019-01-03

## 2019-01-02 MED ORDER — NORMAL SALINE FLUSH 0.9 % IV SOLN
0.9 | INTRAVENOUS | Status: DC | PRN
Start: 2019-01-02 — End: 2019-01-03

## 2019-01-02 MED ORDER — CEFAZOLIN 2000 MG D5W 50 ML IVPB
Status: AC
Start: 2019-01-02 — End: 2019-01-03

## 2019-01-02 MED ORDER — SODIUM CHLORIDE 0.9 % IV SOLN
0.9 | INTRAVENOUS | Status: DC
Start: 2019-01-02 — End: 2019-01-03
  Administered 2019-01-03 (×2): via INTRAVENOUS

## 2019-01-02 MED FILL — ENOXAPARIN SODIUM 30 MG/0.3ML SC SOLN: 30 MG/0.3ML | SUBCUTANEOUS | Qty: 0.3

## 2019-01-02 MED FILL — CEFAZOLIN 2000 MG D5W 50 ML IVPB: Qty: 50

## 2019-01-02 MED FILL — OXYCODONE HCL 5 MG PO TABS: 5 mg | ORAL | Qty: 2

## 2019-01-02 MED FILL — LIPITOR 20 MG PO TABS: 20 mg | ORAL | Qty: 1

## 2019-01-02 MED FILL — SYMBICORT 160-4.5 MCG/ACT IN AERO: 160-4.5 MCG/ACT | RESPIRATORY_TRACT | Qty: 1

## 2019-01-02 MED FILL — CITALOPRAM HYDROBROMIDE 20 MG PO TABS: 20 mg | ORAL | Qty: 1

## 2019-01-02 NOTE — Progress Notes (Signed)
MRI findings from yesterday      Of ? New  L1 Burst Fracture with  Retropulsion, I felt would need surgery and with  Burst element ,   consulted  By phone  with Dr. Diamantina Providence, [facility]  Orthopedic Spine Surgeon ,[place],  Who took my call but is off schedule until Jan 7    Agreed to  Call Dr. Lupita Raider  Spine Surgeon ,  Who is on call [facility] [place]     And after he reviewed chart , his   Reading of  The  Lumbar MRI  Called findings old and would not   Admit patient  Nor  Perform  Balloon Kyphoplasty.L1    This was after I had talked with  Dr. Renard Hamper, PCP,   And told Dr. Renard Hamper that had preliminarily  Arranged   Patient  To go to  [facility]  For Dr. Lupita Raider to perform Balloon Kyphoplasty, , but this was before Dr. Lupita Raider read the Lumbar MRI and told Dr. Darden Palmer  That this  was an old L1 fracture and would not perform  Balloon Kyphoplasty.  I have a call out for Dr. Renard Hamper to  Review this new plan of no Balloon Kyphoplasty acutely with family as Dr. Renard Hamper may have called Patient already.    Plan , Pain control, lumbar corset if needed  and follow with Dr. Darden Palmer  In 2 weeks.  I canceled  The tentative  Balloon Kyphoplasty that was scheduled with me Monday December 28,2020

## 2019-01-03 ENCOUNTER — Observation Stay: Admit: 2019-01-03 | Payer: MEDICARE | Primary: Family Medicine

## 2019-01-03 LAB — COMPREHENSIVE METABOLIC PANEL W/ REFLEX TO MG FOR LOW K
ALT: 17 U/L (ref 5–33)
AST: 19 U/L (ref <32)
Albumin/Globulin Ratio: 1.2 (ref 1.0–2.5)
Albumin: 3.3 g/dL — ABNORMAL LOW (ref 3.5–5.2)
Alkaline Phosphatase: 73 U/L (ref 35–104)
Anion Gap: 8 mmol/L — ABNORMAL LOW (ref 9–17)
BUN: 24 mg/dL — ABNORMAL HIGH (ref 8–23)
CO2: 25 mmol/L (ref 20–31)
Calcium: 8.3 mg/dL — ABNORMAL LOW (ref 8.6–10.4)
Chloride: 111 mmol/L — ABNORMAL HIGH (ref 98–107)
Creatinine: 0.98 mg/dL — ABNORMAL HIGH (ref 0.50–0.90)
GFR African American: >60 mL/min (ref >60)
GFR Non-African American: 55 mL/min — ABNORMAL LOW (ref >60)
Glucose: 91 mg/dL (ref 70–99)
Potassium: 4.1 mmol/L (ref 3.7–5.3)
Sodium: 144 mmol/L (ref 135–144)
Total Bilirubin: 0.21 mg/dL — ABNORMAL LOW (ref 0.3–1.2)
Total Protein: 6.1 g/dL — ABNORMAL LOW (ref 6.4–8.3)

## 2019-01-03 LAB — BASIC METABOLIC PANEL
Anion Gap: 12 mmol/L (ref 9–17)
BUN: 25 mg/dL — ABNORMAL HIGH (ref 8–23)
CO2: 24 mmol/L (ref 20–31)
Calcium: 8.7 mg/dL (ref 8.6–10.4)
Chloride: 108 mmol/L — ABNORMAL HIGH (ref 98–107)
Creatinine: 0.96 mg/dL — ABNORMAL HIGH (ref 0.50–0.90)
GFR African American: >60 mL/min (ref >60)
GFR Non-African American: 56 mL/min — ABNORMAL LOW (ref >60)
Glucose: 89 mg/dL (ref 70–99)
Potassium: 3.8 mmol/L (ref 3.7–5.3)
Sodium: 144 mmol/L (ref 135–144)

## 2019-01-03 LAB — CBC WITH AUTO DIFFERENTIAL
Absolute Eos #: 0.19 10*3/uL (ref 0.00–0.44)
Absolute Eos #: 0.29 10*3/uL (ref 0.00–0.44)
Absolute Immature Granulocyte: 0.13 10*3/uL (ref 0.00–0.30)
Absolute Immature Granulocyte: 0.13 10*3/uL (ref 0.00–0.30)
Absolute Lymph #: 1.54 10*3/uL (ref 1.10–3.70)
Absolute Lymph #: 1.79 10*3/uL (ref 1.10–3.70)
Absolute Mono #: 0.81 10*3/uL (ref 0.10–1.20)
Absolute Mono #: 0.84 10*3/uL (ref 0.10–1.20)
Basophils Absolute: 0.06 10*3/uL (ref 0.00–0.20)
Basophils Absolute: 0.07 10*3/uL (ref 0.00–0.20)
Basophils: 1 % (ref 0–2)
Basophils: 1 % (ref 0–2)
Eosinophils %: 2 % (ref 1–4)
Eosinophils %: 3 % (ref 1–4)
Hematocrit: 34.4 % — ABNORMAL LOW (ref 36.3–47.1)
Hematocrit: 36 % — ABNORMAL LOW (ref 36.3–47.1)
Hemoglobin: 10.6 g/dL — ABNORMAL LOW (ref 11.9–15.1)
Hemoglobin: 10.7 g/dL — ABNORMAL LOW (ref 11.9–15.1)
Immature Granulocytes: 1 % — ABNORMAL HIGH
Immature Granulocytes: 2 % — ABNORMAL HIGH
Lymphocytes: 17 % — ABNORMAL LOW (ref 24–43)
Lymphocytes: 19 % — ABNORMAL LOW (ref 24–43)
MCH: 27.8 pg (ref 25.2–33.5)
MCH: 27.9 pg (ref 25.2–33.5)
MCHC: 29.7 g/dL (ref 28.4–34.8)
MCHC: 30.8 g/dL (ref 28.4–34.8)
MCV: 90.5 fL (ref 82.6–102.9)
MCV: 93.5 fL (ref 82.6–102.9)
MPV: 10.2 fL (ref 8.1–13.5)
MPV: 10.5 fL (ref 8.1–13.5)
Monocytes: 9 % (ref 3–12)
Monocytes: 9 % (ref 3–12)
NRBC Automated: 0 per 100 WBC
NRBC Automated: 0 per 100 WBC
Platelets: 172 10*3/uL (ref 138–453)
Platelets: 172 10*3/uL (ref 138–453)
RBC: 3.8 m/uL — ABNORMAL LOW (ref 3.95–5.11)
RBC: 3.85 m/uL — ABNORMAL LOW (ref 3.95–5.11)
RDW: 16.5 % — ABNORMAL HIGH (ref 11.8–14.4)
RDW: 16.6 % — ABNORMAL HIGH (ref 11.8–14.4)
Seg Neutrophils: 67 % — ABNORMAL HIGH (ref 36–65)
Seg Neutrophils: 69 % — ABNORMAL HIGH (ref 36–65)
Segs Absolute: 6.14 10*3/uL (ref 1.50–8.10)
Segs Absolute: 6.41 10*3/uL (ref 1.50–8.10)
WBC: 8.9 10*3/uL (ref 3.5–11.3)
WBC: 9.5 10*3/uL (ref 3.5–11.3)

## 2019-01-03 LAB — PROTIME-INR
INR: 1
Protime: 10.2 s (ref 9.0–12.0)

## 2019-01-03 LAB — COVID-19: SARS-CoV-2, Rapid: NOT DETECTED

## 2019-01-03 LAB — COVID-19 AMBULATORY: SARS-CoV-2, NAA: NOT DETECTED

## 2019-01-03 LAB — VITAMIN D 25 HYDROXY: Vit D, 25-Hydroxy: 23.5 ng/mL — ABNORMAL LOW (ref 30.0–100.0)

## 2019-01-03 MED ORDER — KETOROLAC TROMETHAMINE 30 MG/ML IJ SOLN
30 MG/ML | Freq: Once | INTRAMUSCULAR | Status: AC
Start: 2019-01-03 — End: 2019-01-03
  Administered 2019-01-03: 19:00:00 30 mg via INTRAMUSCULAR

## 2019-01-03 MED ORDER — MIDAZOLAM HCL 2 MG/2ML IJ SOLN
2 MG/ML | INTRAMUSCULAR | Status: DC | PRN
Start: 2019-01-03 — End: 2019-01-03
  Administered 2019-01-03: 12:00:00 2 via INTRAVENOUS

## 2019-01-03 MED ORDER — LIDOCAINE HCL (PF) 1 % IJ SOLN
1 % | INTRAMUSCULAR | Status: DC | PRN
Start: 2019-01-03 — End: 2019-01-03
  Administered 2019-01-03: 13:00:00 50 via INTRAVENOUS

## 2019-01-03 MED ORDER — DEXAMETHASONE SODIUM PHOSPHATE 4 MG/ML IJ SOLN
4 MG/ML | Freq: Once | INTRAMUSCULAR | Status: DC | PRN
Start: 2019-01-03 — End: 2019-01-03

## 2019-01-03 MED ORDER — DEXAMETHASONE SODIUM PHOSPHATE 4 MG/ML IJ SOLN
4 MG/ML | INTRAMUSCULAR | Status: DC | PRN
Start: 2019-01-03 — End: 2019-01-03
  Administered 2019-01-03: 13:00:00 8 via INTRAVENOUS

## 2019-01-03 MED ORDER — ONDANSETRON HCL 4 MG/2ML IJ SOLN
4 | INTRAMUSCULAR | Status: AC
Start: 2019-01-03 — End: 2019-01-03

## 2019-01-03 MED ORDER — DEXAMETHASONE SODIUM PHOSPHATE 4 MG/ML IJ SOLN
4 | INTRAMUSCULAR | Status: AC
Start: 2019-01-03 — End: 2019-01-03

## 2019-01-03 MED ORDER — ROCURONIUM BROMIDE 100 MG/10ML IV SOLN
100 | INTRAVENOUS | Status: AC
Start: 2019-01-03 — End: 2019-01-03

## 2019-01-03 MED ORDER — FENTANYL CITRATE (PF) 100 MCG/2ML IJ SOLN
100 MCG/2ML | INTRAMUSCULAR | Status: DC | PRN
Start: 2019-01-03 — End: 2019-01-03

## 2019-01-03 MED ORDER — TIZANIDINE HCL 2 MG PO TABS
2 MG | ORAL_TABLET | Freq: Every evening | ORAL | 0 refills | Status: DC | PRN
Start: 2019-01-03 — End: 2019-01-09

## 2019-01-03 MED ORDER — EPINEPHRINE 1 MG/10ML IJ SOSY
1 | INTRAMUSCULAR | Status: AC
Start: 2019-01-03 — End: 2019-01-03

## 2019-01-03 MED ORDER — IOTHALAMATE MEGLUMINE 60 % IJ SOLN
60 % | INTRAMUSCULAR | Status: DC | PRN
Start: 2019-01-03 — End: 2019-01-03
  Administered 2019-01-03: 13:00:00 50 via INTRAVENOUS

## 2019-01-03 MED ORDER — OXYCODONE-ACETAMINOPHEN 5-325 MG PO TABS
5-325 MG | ORAL_TABLET | Freq: Four times a day (QID) | ORAL | 0 refills | Status: DC | PRN
Start: 2019-01-03 — End: 2019-01-09

## 2019-01-03 MED ORDER — SODIUM CHLORIDE 0.9 % IR SOLN
0.9 % | Status: AC | PRN
Start: 2019-01-03 — End: 2019-01-03
  Administered 2019-01-03: 13:00:00 1000

## 2019-01-03 MED ORDER — CEFAZOLIN SODIUM-DEXTROSE 2-3 GM-%(50ML) IV SOLR
2000 mg in Dextrose 3% | INTRAVENOUS | Status: DC | PRN
Start: 2019-01-03 — End: 2019-01-03
  Administered 2019-01-03: 13:00:00 2 via INTRAVENOUS

## 2019-01-03 MED ORDER — MEPERIDINE HCL 50 MG/ML IJ SOLN
50 MG/ML | INTRAMUSCULAR | Status: DC | PRN
Start: 2019-01-03 — End: 2019-01-03

## 2019-01-03 MED ORDER — LIDOCAINE HCL (PF) 1 % IJ SOLN
1 | INTRAMUSCULAR | Status: AC
Start: 2019-01-03 — End: 2019-01-03

## 2019-01-03 MED ORDER — FENTANYL CITRATE (PF) 100 MCG/2ML IJ SOLN
100 MCG/2ML | INTRAMUSCULAR | Status: DC | PRN
Start: 2019-01-03 — End: 2019-01-03
  Administered 2019-01-03 (×2): 25 ug via INTRAVENOUS

## 2019-01-03 MED ORDER — MIDAZOLAM HCL 2 MG/2ML IJ SOLN
2 | INTRAMUSCULAR | Status: AC
Start: 2019-01-03 — End: 2019-01-03

## 2019-01-03 MED ORDER — ONDANSETRON HCL 4 MG/2ML IJ SOLN
4 MG/2ML | INTRAMUSCULAR | Status: DC | PRN
Start: 2019-01-03 — End: 2019-01-03
  Administered 2019-01-03: 13:00:00 4 via INTRAVENOUS

## 2019-01-03 MED ORDER — FENTANYL CITRATE (PF) 100 MCG/2ML IJ SOLN
100 MCG/2ML | INTRAMUSCULAR | Status: DC | PRN
Start: 2019-01-03 — End: 2019-01-03
  Administered 2019-01-03: 13:00:00 50 via INTRAVENOUS

## 2019-01-03 MED ORDER — PROPOFOL 200 MG/20ML IV EMUL
200 | INTRAVENOUS | Status: AC
Start: 2019-01-03 — End: 2019-01-03

## 2019-01-03 MED ORDER — HYDRALAZINE HCL 20 MG/ML IJ SOLN
20 MG/ML | INTRAMUSCULAR | Status: DC | PRN
Start: 2019-01-03 — End: 2019-01-03

## 2019-01-03 MED ORDER — LABETALOL HCL 5 MG/ML IV SOLN
5 MG/ML | INTRAVENOUS | Status: DC | PRN
Start: 2019-01-03 — End: 2019-01-03

## 2019-01-03 MED ORDER — SUGAMMADEX SODIUM 200 MG/2ML IV SOLN
200 | INTRAVENOUS | Status: DC | PRN
Start: 2019-01-03 — End: 2019-01-03
  Administered 2019-01-03: 13:00:00 132 via INTRAVENOUS

## 2019-01-03 MED ORDER — DIPHENHYDRAMINE HCL 50 MG/ML IJ SOLN
50 MG/ML | Freq: Once | INTRAMUSCULAR | Status: DC | PRN
Start: 2019-01-03 — End: 2019-01-03

## 2019-01-03 MED ORDER — LACTATED RINGERS IV SOLN
INTRAVENOUS | Status: DC | PRN
Start: 2019-01-03 — End: 2019-01-03
  Administered 2019-01-03: 12:00:00 via INTRAVENOUS

## 2019-01-03 MED ORDER — ONDANSETRON HCL 4 MG/2ML IJ SOLN
4 MG/2ML | Freq: Once | INTRAMUSCULAR | Status: DC | PRN
Start: 2019-01-03 — End: 2019-01-03

## 2019-01-03 MED ORDER — FENTANYL CITRATE (PF) 100 MCG/2ML IJ SOLN
100 | INTRAMUSCULAR | Status: AC
Start: 2019-01-03 — End: 2019-01-03

## 2019-01-03 MED ORDER — BUPIVACAINE-EPINEPHRINE (PF) 0.5% -1:200000 IJ SOLN
INTRAMUSCULAR | Status: DC | PRN
Start: 2019-01-03 — End: 2019-01-03
  Administered 2019-01-03: 13:00:00 5.5 via INTRAPLEURAL

## 2019-01-03 MED ORDER — ROCURONIUM BROMIDE 100 MG/10ML IV SOLN
100 | INTRAVENOUS | Status: DC | PRN
Start: 2019-01-03 — End: 2019-01-03
  Administered 2019-01-03: 13:00:00 30 via INTRAVENOUS

## 2019-01-03 MED ORDER — PROPOFOL 200 MG/20ML IV EMUL
200 MG/20ML | INTRAVENOUS | Status: DC | PRN
Start: 2019-01-03 — End: 2019-01-03
  Administered 2019-01-03: 13:00:00 10 via INTRAVENOUS

## 2019-01-03 MED FILL — ROCURONIUM BROMIDE 100 MG/10ML IV SOLN: 100 MG/10ML | INTRAVENOUS | Qty: 10

## 2019-01-03 MED FILL — CITALOPRAM HYDROBROMIDE 20 MG PO TABS: 20 mg | ORAL | Qty: 1

## 2019-01-03 MED FILL — KETOROLAC TROMETHAMINE 30 MG/ML IJ SOLN: 30 mg/mL | INTRAMUSCULAR | Qty: 1

## 2019-01-03 MED FILL — DEXAMETHASONE SODIUM PHOSPHATE 4 MG/ML IJ SOLN: 4 mg/mL | INTRAMUSCULAR | Qty: 1

## 2019-01-03 MED FILL — ENOXAPARIN SODIUM 30 MG/0.3ML SC SOLN: 30 MG/0.3ML | SUBCUTANEOUS | Qty: 0.3

## 2019-01-03 MED FILL — EPINEPHRINE 1 MG/10ML IJ SOSY: 1 MG/0ML | INTRAMUSCULAR | Qty: 10

## 2019-01-03 MED FILL — PROPOFOL 200 MG/20ML IV EMUL: 200 MG/20ML | INTRAVENOUS | Qty: 20

## 2019-01-03 MED FILL — FENTANYL CITRATE (PF) 100 MCG/2ML IJ SOLN: 100 MCG/2ML | INTRAMUSCULAR | Qty: 2

## 2019-01-03 MED FILL — ONDANSETRON HCL 4 MG/2ML IJ SOLN: 4 MG/2ML | INTRAMUSCULAR | Qty: 2

## 2019-01-03 MED FILL — TRAMADOL HCL 50 MG PO TABS: 50 mg | ORAL | Qty: 1

## 2019-01-03 MED FILL — LIPITOR 20 MG PO TABS: 20 mg | ORAL | Qty: 1

## 2019-01-03 MED FILL — MIDAZOLAM HCL 2 MG/2ML IJ SOLN: 2 mg/mL | INTRAMUSCULAR | Qty: 2

## 2019-01-03 MED FILL — ACETAMINOPHEN 325 MG PO TABS: 325 mg | ORAL | Qty: 2

## 2019-01-03 MED FILL — XYLOCAINE-MPF 1 % IJ SOLN: 1 % | INTRAMUSCULAR | Qty: 5

## 2019-01-03 NOTE — Discharge Instructions (Signed)
Orthopedic Instructions:  -Weight bearing status: Activities as tolerated. Over the next few days, take it easy and do not over-do any activities. Avoid lifting heavy objects. You are able to be up and out of bed as tolerated.   -Keep dressing clean and dry. You are able to remove the Band-Aids starting 3 days after surgery. You may either replace the Band-Aids or you may leave the Steri-Strips exposed. Do not pull off the Steri-Strips. They will fall off on their own over time.   -Ice (20 minutes on and off 1 hour) to reduce swelling and throbbing pain.  -Drink plenty of fluids.   -Should urinate within 8 hours of surgery.  -Call the office or come to Emergency Room if signs of infection appear (hot, swollen, red, draining pus, fever)  -Take medications as prescribed.  -Wean off narcotics (percocet/norco) as soon as possible. Do not take tylenol if still taking narcotics.  -Follow up with Dr. Lupita Raider in their office 10-14 days after surgery. Call [phone] to schedule/confirm.    Discharge instructions provided by Ginny Forth, DO

## 2019-01-09 ENCOUNTER — Inpatient Hospital Stay: Payer: MEDICARE | Primary: Family Medicine

## 2019-01-09 ENCOUNTER — Ambulatory Visit: Admit: 2019-01-09 | Discharge: 2019-01-09 | Payer: MEDICARE | Attending: Family Medicine | Primary: Family Medicine

## 2019-01-09 ENCOUNTER — Inpatient Hospital Stay: Admit: 2019-01-09 | Payer: MEDICARE | Primary: Family Medicine

## 2019-01-09 DIAGNOSIS — R109 Unspecified abdominal pain: Secondary | ICD-10-CM

## 2019-01-09 DIAGNOSIS — R300 Dysuria: Secondary | ICD-10-CM

## 2019-01-09 DIAGNOSIS — J449 Chronic obstructive pulmonary disease, unspecified: Secondary | ICD-10-CM

## 2019-01-09 LAB — CBC WITH AUTO DIFFERENTIAL
Absolute Eos #: 0.16 10*3/uL (ref 0.00–0.44)
Absolute Immature Granulocyte: 0.23 10*3/uL (ref 0.00–0.30)
Absolute Lymph #: 1.34 10*3/uL (ref 1.10–3.70)
Absolute Mono #: 1.2 10*3/uL (ref 0.10–1.20)
Basophils Absolute: 0.07 10*3/uL (ref 0.00–0.20)
Basophils: 1 % (ref 0–2)
Eosinophils %: 1 % (ref 1–4)
Hematocrit: 36.3 % (ref 36.3–47.1)
Hemoglobin: 11.3 g/dL — ABNORMAL LOW (ref 11.9–15.1)
Immature Granulocytes: 2 % — ABNORMAL HIGH
Lymphocytes: 10 % — ABNORMAL LOW (ref 24–43)
MCH: 28.5 pg (ref 25.2–33.5)
MCHC: 31.1 g/dL (ref 25.2–33.5)
MCV: 91.7 fL (ref 82.6–102.9)
MPV: 9 fL (ref 8.1–13.5)
Monocytes: 9 % (ref 3–12)
NRBC Automated: 0 per 100 WBC
Platelets: 226 10*3/uL (ref 138–453)
RBC: 3.96 m/uL (ref 3.95–5.11)
RDW: 17.1 % — ABNORMAL HIGH (ref 11.8–14.4)
Seg Neutrophils: 77 % — ABNORMAL HIGH (ref 36–65)
Segs Absolute: 10.81 10*3/uL — ABNORMAL HIGH (ref 1.50–8.10)
WBC: 13.8 10*3/uL — ABNORMAL HIGH (ref 3.5–11.3)

## 2019-01-09 LAB — COMPREHENSIVE METABOLIC PANEL
ALT: 11 U/L (ref 5–33)
AST: 17 U/L (ref <32)
Albumin/Globulin Ratio: 1.4 (ref 1.0–2.5)
Albumin: 4.1 g/dL (ref 3.5–5.2)
Alkaline Phosphatase: 96 U/L (ref 35–104)
Anion Gap: 10 mmol/L (ref 9–17)
BUN: 13 mg/dL (ref 8–23)
Bun/Cre Ratio: 16 (ref 9–20)
CO2: 27 mmol/L (ref 20–31)
Calcium: 9.5 mg/dL (ref 8.6–10.4)
Chloride: 103 mmol/L (ref 98–107)
Creatinine: 0.81 mg/dL (ref 0.50–0.90)
GFR African American: >60 mL/min (ref >60)
GFR Non-African American: >60 mL/min (ref >60)
Glucose: 108 mg/dL — ABNORMAL HIGH (ref 70–99)
Potassium: 3.9 mmol/L (ref 3.7–5.3)
Sodium: 140 mmol/L (ref 135–144)
Total Bilirubin: 0.43 mg/dL (ref 0.3–1.2)
Total Protein: 7 g/dL (ref 6.4–8.3)

## 2019-01-09 LAB — URINALYSIS WITH MICROSCOPIC
Bilirubin Urine: NEGATIVE
Epithelial Cells UA: 0 /HPF (ref 0–5)
Glucose, Ur: NEGATIVE
Ketones, Urine: NEGATIVE
Leukocyte Esterase, Urine: NEGATIVE
Nitrite, Urine: NEGATIVE
Protein, UA: NEGATIVE
RBC, UA: 0 /HPF (ref 0–4)
Specific Gravity, UA: 1.015 (ref 1.010–1.025)
Urobilinogen, Urine: NORMAL
WBC, UA: 0 /HPF (ref 0–4)
pH, UA: 7 — ABNORMAL HIGH (ref 5.0–6.0)

## 2019-01-09 LAB — AMYLASE: Amylase: 67 U/L (ref 28–100)

## 2019-01-09 LAB — LIPASE: Lipase: 31 U/L (ref 13–60)

## 2019-01-09 MED ORDER — OMEPRAZOLE 20 MG PO CPDR
20 MG | ORAL_CAPSULE | Freq: Every day | ORAL | 5 refills | Status: DC
Start: 2019-01-09 — End: 2019-03-18

## 2019-01-11 LAB — CULTURE, URINE: Culture: NO GROWTH

## 2019-01-13 ENCOUNTER — Emergency Department: Admit: 2019-01-13 | Payer: MEDICARE | Primary: Family Medicine

## 2019-01-13 ENCOUNTER — Inpatient Hospital Stay: Admit: 2019-01-13 | Discharge: 2019-01-14 | Disposition: A | Discharge status: Home / Self Care | Payer: MEDICARE

## 2019-01-13 DIAGNOSIS — G44209 Tension-type headache, unspecified, not intractable: Secondary | ICD-10-CM

## 2019-01-13 LAB — COMPREHENSIVE METABOLIC PANEL W/ REFLEX TO MG FOR LOW K
ALT: 12 U/L (ref 5–33)
AST: 16 U/L (ref <32)
Albumin/Globulin Ratio: 1.4 (ref 1.0–2.5)
Albumin: 3.9 g/dL (ref 3.5–5.2)
Alkaline Phosphatase: 94 U/L (ref 35–104)
Anion Gap: 10 mmol/L (ref 9–17)
BUN: 11 mg/dL (ref 8–23)
Bun/Cre Ratio: 15 (ref 9–20)
CO2: 28 mmol/L (ref 20–31)
Calcium: 9.6 mg/dL (ref 8.6–10.4)
Chloride: 104 mmol/L (ref 98–107)
Creatinine: 0.72 mg/dL (ref 0.50–0.90)
GFR African American: >60 mL/min (ref >60)
GFR Non-African American: >60 mL/min (ref >60)
Glucose: 110 mg/dL — ABNORMAL HIGH (ref 70–99)
Potassium: 3.1 mmol/L — ABNORMAL LOW (ref 3.7–5.3)
Sodium: 142 mmol/L (ref 135–144)
Total Bilirubin: 0.37 mg/dL (ref 0.3–1.2)
Total Protein: 6.7 g/dL (ref 6.4–8.3)

## 2019-01-13 LAB — CBC WITH AUTO DIFFERENTIAL
Absolute Eos #: 0.25 10*3/uL (ref 0.00–0.44)
Absolute Immature Granulocyte: 0.11 10*3/uL (ref 0.00–0.30)
Absolute Lymph #: 1.4 10*3/uL (ref 1.10–3.70)
Absolute Mono #: 1.49 10*3/uL — ABNORMAL HIGH (ref 0.10–1.20)
Basophils Absolute: 0.07 10*3/uL (ref 0.00–0.20)
Basophils: 1 % (ref 0–2)
Eosinophils %: 2 % (ref 1–4)
Hematocrit: 37.7 % (ref 36.3–47.1)
Hemoglobin: 11.9 g/dL (ref 11.9–15.1)
Immature Granulocytes: 1 % — ABNORMAL HIGH
Lymphocytes: 11 % — ABNORMAL LOW (ref 24–43)
MCH: 28.5 pg (ref 25.2–33.5)
MCHC: 31.6 g/dL (ref 25.2–33.5)
MCV: 90.2 fL (ref 82.6–102.9)
MPV: 8.9 fL (ref 8.1–13.5)
Monocytes: 12 % (ref 3–12)
NRBC Automated: 0 per 100 WBC
Platelets: 220 10*3/uL (ref 138–453)
RBC: 4.18 m/uL (ref 3.95–5.11)
RDW: 17.2 % — ABNORMAL HIGH (ref 11.8–14.4)
Seg Neutrophils: 73 % — ABNORMAL HIGH (ref 36–65)
Segs Absolute: 9.42 10*3/uL — ABNORMAL HIGH (ref 1.50–8.10)
WBC: 12.7 10*3/uL — ABNORMAL HIGH (ref 3.5–11.3)

## 2019-01-13 LAB — COVID-19: SARS-CoV-2, Rapid: NOT DETECTED

## 2019-01-13 LAB — MAGNESIUM: Magnesium: 1.8 mg/dL (ref 1.6–2.6)

## 2019-01-13 LAB — LIPASE: Lipase: 25 U/L (ref 13–60)

## 2019-01-13 MED ORDER — TRAMADOL HCL 50 MG PO TABS
50 MG | Freq: Once | ORAL | Status: AC
Start: 2019-01-13 — End: 2019-01-13
  Administered 2019-01-13: 23:00:00 50 mg via ORAL

## 2019-01-13 MED FILL — TRAMADOL HCL 50 MG PO TABS: 50 mg | ORAL | Qty: 1

## 2019-01-13 NOTE — Discharge Instructions (Signed)
Please make sure you follow-up with your family doctor regarding your concerns.  Can try helping your headache by relaxing using heating pad or icy hot on the neck.  Can consider massage.  Recommend monitoring blood pressure with a good normal cuff.  Esther loose stools with your doctor.  Can turn in the specimen for [place] to check for C. difficile.  May return for any emergency type symptoms.

## 2019-01-14 ENCOUNTER — Inpatient Hospital Stay: Payer: MEDICARE | Primary: Family Medicine

## 2019-01-14 DIAGNOSIS — R197 Diarrhea, unspecified: Secondary | ICD-10-CM

## 2019-01-14 LAB — EKG 12-LEAD
Atrial Rate: 73 {beats}/min
P-R Interval: 156 ms
Q-T Interval: 360 ms
QRS Duration: 72 ms
QTc Calculation (Bazett): 396 ms
R Axis: 27 degrees
T Axis: -7 degrees
Ventricular Rate: 73 {beats}/min

## 2019-01-14 NOTE — Telephone Encounter (Signed)
C. Diff test now running on returned stool sample.  Please see previous message regarding questions to pt related to BP and stool/abdominal sx's.

## 2019-01-14 NOTE — Telephone Encounter (Signed)
Son-in-law dropped off stool specimen at lab with no orders.  Can't find any among the Clinic side encounters.  Not seeing any in the hospital side either.

## 2019-01-14 NOTE — Telephone Encounter (Signed)
Cannot read ER note yet from yesterday.  CT head was done, which was negative for acute abnormality.  Documented BP readings are 164/93, 163/93, and 152/92.    Is she having HA or other sx's today, or did they just tell her to follow-up with me regarding her BP?  Does she have a BP cuff at home so she can check readings at home?    How are her abdominal sx's?

## 2019-01-14 NOTE — Telephone Encounter (Signed)
Pre notes pt contacted Dr. Renard Hamper Office regarding ED visit.

## 2019-01-14 NOTE — Telephone Encounter (Signed)
Patient was seen in the ER 01/13/2019.  During that time she states her BP was elevated,  170-180/102.  She was told to not worry about it but she did have a HA the night before and day of her going to ER.

## 2019-01-14 NOTE — Care Coordination-Inpatient (Signed)
Katie Juarez was seen [facility] 01/13/2019   She is eligible for [facility]    01/14/2019- 11:06 am Unable to reach for initial ER F/U call d/t line busy.   01/15/2019- 10:48 am- spoke with [name]. She c/o fatigue, loose stools improving, back pain.   She lives alone. She has family that assists with care.   She is S/P surgery- Compression Fx L1 vertebrae- 01/03/2019 spikyphoplasty.   She is not scheduled for F/U d/t going to [place] for the winter. She was advised to see ortho in [place] for follow up. They are planning to leave 01/19/2019. But she stated she could schedule next week with him and if they did leave- she would cancel. Scheduled Tuesday 01/22/2019- 9:40 am. They will attempt to mail information to patient. She is to bring completed forms to appt. If she does not receive- arrive 20-25 minutes early to complete in office at [place].   Called [facility]- she was given above appt and contact information. She is aware of packet arriving in mail and need to complete and take to appt or arrive early to complete there.     Discussed [facility] and she is going to go to [place] for the winter. Her daughter is a Marine scientist. She will reach out to this writer if needed. Contact information given.   Denied constipation.   She is staying hydrated.   She has been in contact with PCP.        Patient contacted regarding COVID-19 ??risk. Discussed COVID-19 related testing which was available at this time. Test results were negative. Patient informed of results, if available? Yes    Care Transition Nurse/ Ambulatory Care Manager contacted the patient by telephone to perform post discharge assessment. Call within 2 business days of discharge: Yes. Verified name and DOB with patient as identifiers. Provided introduction to self, and explanation of the CTN/ACM role, and reason for call due to risk factors for infection and/or exposure to COVID-19.     Symptoms reviewed with patient who verbalized the following symptoms: fatigue, diarrhea, no new  symptoms and no worsening symptoms.      Due to no new or worsening symptoms encounter was not routed to provider for escalation. Discussed follow-up appointments. If no appointment was previously scheduled, appointment scheduling offered: Yes  [facility] follow up appointment(s):   Future Appointments   Date Time Provider [place]   04/29/2019 10:00 AM [name], DO [facility]     Non-[facility] follow up appointment(s): n/a    Non-face-to-face services provided:  [facility]22[facility]2019  Obtained and reviewed discharge summary and/or continuity of care documents     Advance Care Planning:   Does patient have an Advance Directive:  decision maker updated.     Patient has following risk factors of: asthma. CTN/ACM reviewed discharge instructions, medical action plan and red flags such as increased shortness of breath, increasing fever and signs of decompensation with patient who verbalized understanding.   Discussed exposure protocols and quarantine with CDC Guidelines ???What to do if you are sick with coronavirus disease 2019.??? Patient was given an opportunity for questions and concerns. The patient agrees to contact the [facility] exposure line [phone], local health department [place] [facility]: [phone]) and PCP office for questions related to their healthcare. CTN/ACM provided contact information for future needs.    Reviewed and educated patient on any new and changed medications related to discharge diagnosis     Patient/family/caregiver given information for [employer]  Loop and agrees to enroll declined  Patient's preferred e-mail: n/a   Patient's preferred phone number: n/a  Based on Loop alert triggers, patient will be contacted by nurse care manager for worsening symptoms.    She has resources- no further outreaches at this time.  based on severity of symptoms and risk factors.

## 2019-01-15 LAB — C DIFF TOXIN/ANTIGEN: C DIFF AG + TOXIN: NEGATIVE

## 2019-01-15 NOTE — Telephone Encounter (Signed)
Left message to call back.

## 2019-01-15 NOTE — ACP (Advance Care Planning) (Signed)
She has updated ACP documents on file.   Healthcare decision makers updated.

## 2019-01-15 NOTE — Telephone Encounter (Signed)
Pt calling stating she spoke with her daughters and they both feel pt is depressed and pt would thinks her anti-depressant needs to be increased, pt uses loaded pharmacy, please advise at above number

## 2019-01-15 NOTE — Telephone Encounter (Signed)
As discussed with nursing staff earlier this afternoon, pt will need to be seen (either in person or virtually) if she wants to discuss a change in her medication.  Can she do tomorrow at 1:40 pm?

## 2019-01-15 NOTE — Telephone Encounter (Signed)
Noted - per other telephone encounter from today, pt has concerns that may warrant VV tomorrow.  Will discuss BP with her at that time as well.

## 2019-01-15 NOTE — Telephone Encounter (Signed)
Katie Juarez says she still has loose stools but not quite as bad as yesterday.  She only has abd cramping when she has to have a BM.  Has not taken BP today yet but yesterday was 158/75, P 76.  Her H/A is better. Still has mid back pain.

## 2019-01-16 ENCOUNTER — Telehealth: Admit: 2019-01-16 | Discharge: 2019-01-16 | Payer: MEDICARE | Attending: Family Medicine | Primary: Family Medicine

## 2019-01-16 DIAGNOSIS — F419 Anxiety disorder, unspecified: Secondary | ICD-10-CM

## 2019-01-16 NOTE — Telephone Encounter (Signed)
Noted, thank you.

## 2019-01-16 NOTE — Telephone Encounter (Signed)
Spoke with Katie Juarez- agreeable to VV this afternoon to discuss depression.  Will have Katie Juarez help her. Send text to [phone]. Had a terrible H/A last evening.  Took Tylenol & Ibuprofen, better this am.  Will report it to her neurosurgeon at next week's appt.

## 2019-01-18 ENCOUNTER — Telehealth

## 2019-01-18 NOTE — Telephone Encounter (Signed)
Pt is calling with BP for KB.    01/17/19 168-90 pulse 78  01/17/19 152-82 pulse 75  01/18/19 166-95 pulse 79  01/18/19 167-85 pulse 67    Pt tried taking 2 of the citalopram (20mg s) before bed and in the morning she had no energy.  Pt said it made her very sleepy.    Pt said she went back to taking one citalopram and one ativan and feels it works better.    Pt said she is still having dull headaches across her headaches    Please call pt and advise

## 2019-01-25 ENCOUNTER — Encounter: Attending: Dermatology | Primary: Family Medicine

## 2019-01-30 NOTE — Telephone Encounter (Signed)
Please apologize to pt - the original message from 1/8 was not sent to anyone after front desk staff took message, so I never saw it.      Regarding her BP readings, does she have any newer readings, and if so, are they approx the same as those listed?  How has her pain been since we spoke last, since we know that could be causing her BP to be elevated?  Similarly, how have her nerves been?    If she feels improved with pain and nerves, and we do not feel either are negatively impacting her BP, we could start a low-dose BP med for her to take once daily.  Her goal BP range is to be <150/90, so she is still slightly higher than goal.  Could start Lisinopril 5 mg daily, and see if that is enough to bring her down to goal range.    Any recent diet changes to explain her worsened reflux, or does she feel it could be related to increased stress?  I am fine with her taking Omeprazole 20 mg BID, if she feels like that does help.  Does that control her sx's well enough?      We would like this to be a temporary increase, however, so would recommend she re-evaluate in 3-4 weeks to see if she can go back down to 20 mg once daily.  If not, she should let us know.

## 2019-01-30 NOTE — Telephone Encounter (Signed)
Pt calling to check on status of this readings, pt has acid reflux really bad, pt is on omeprazole and pt has started taking bid, please advise.

## 2019-01-31 NOTE — Telephone Encounter (Signed)
Called pt apologized to pt for you. Pt most resent bp readings are 01/31/19 178/97 pulse 76  1/20/21185/97 pulse 71 at 7:44pm 166/97  Pt states has had a headache, and a lot of reflux  Pt has been taking Omeprazole BID one in am and pm still having a lot of reflux when drinks water gets a cough sneezes and has clear phlegm come up like water.  Pt has had 2 surgery's one on back has appt 02/19/19 to see that Dr. But denies pain. Pt willing to start the low dose Lisinopril to see if that brings bp down to goal. Pt would like that sent to [facility] in [place].  Pt states is taking Ativan in pm and citalopram in pm helps.  Pt asking for something for bones like Fosamax or proliria injections twice a year.

## 2019-02-01 MED ORDER — LISINOPRIL 10 MG PO TABS
10 MG | ORAL_TABLET | Freq: Every day | ORAL | 0 refills | Status: DC
Start: 2019-02-01 — End: 2019-02-14

## 2019-02-01 NOTE — Telephone Encounter (Signed)
Yes, BP readings are still much too high - will send in Rx for Lisinopril 10 mg daily, instead of 5 mg, as the listed readings from yesterday are even higher.  Rx sent to pharmacy now.      Pt to take in the morning, and then check BP no earlier than 2 hours after taking.  She can check 1-2x's per day and see if readings coming down.  Should also monitor to see if HA improves with lower readings; if not, should let us know.    Has increasing the Omeprazole to twice daily helped with the reflux at all, even a little?  Is she having heartburn/belching, or just the reflux of the clear liquid?    Last DEXA scan was in 09/2017 at [facility] - showed osteopenia.  Would recommend at minimum that she take Calcium + Vit D once daily to help prevent further bone loss.  May be able to get other meds covered for osteopenia with recent fracture - Fosamax would be generic and once weekly, and Prolia would be injected twice yearly.  Does she have a preference?

## 2019-02-01 NOTE — Telephone Encounter (Signed)
Called Katie Juarez let Katie Juarez know will change Lisinopril to 10mg , and take bp to see if headache goes away and blood pressure goes down  .Katie Juarez would like to start the Fosamax to see how that works, but Katie Juarez is already taking the calcium + vit D 600mg  can she take both Katie Juarez wants to know. Spoke to Dr she states can take both together and let Katie Juarez know  Katie Juarez states just started taking 2 of the omeprazole had no burning or acid, just stated would sneeze, cough, then clear liquid would come up. Katie Juarez states if drinks water that even comes up.  Katie Juarez is going to [place] 03/01/19, so Katie Juarez could come in for an appointment 02/27/19 if we can have someone put that in for an appointment that day.

## 2019-02-01 NOTE — Telephone Encounter (Signed)
Pt calling questioning why she hasn't gotten a call back, pt requesting a call back today.

## 2019-02-04 ENCOUNTER — Telehealth

## 2019-02-04 LAB — URINALYSIS WITH MICROSCOPIC
Bilirubin Urine: NEGATIVE
Casts UA: ABSENT
Crystals, UA: ABSENT
Glucose, Ur: NEGATIVE mg/dL
Ketones, Urine: NEGATIVE mg/dL
MUCUS, URINE: ABSENT
Nitrite, Urine: NEGATIVE
Protein, UA: NEGATIVE mg/dL
Specific Gravity, Urine: 1.01 mg/dL (ref 1.005–1.03)
Trichomonas, Urine: ABSENT
Urobilinogen, Urine: 0.2 mg/dL (ref 0.2–1.0)
Yeast, Urine: ABSENT
pH, UA: 6 (ref 5.0–8.5)

## 2019-02-04 MED ORDER — ALENDRONATE SODIUM 70 MG PO TABS
70 MG | ORAL_TABLET | ORAL | 3 refills | Status: DC
Start: 2019-02-04 — End: 2020-04-11

## 2019-02-04 NOTE — Telephone Encounter (Signed)
Can pt come in some time on 02/20/19 to talk about the reflux and re-check BP?    Fosamax Rx sent to pharmacy - will have to see if insurance will cover for osteopenia alone.

## 2019-02-04 NOTE — Telephone Encounter (Signed)
Called pt let pt know appointment is 02/20/19 at 9:20am and will fax the order to [facility] for pt to do a urine. Pt verbalized understanding.

## 2019-02-04 NOTE — Telephone Encounter (Signed)
Called pt with results on UA that were received via fax from [facility].  UA shows 1+ blood, 1+ LE, 1+ bacteria, and 11-20 WBC's.  Culture results pending.    Discussed with pt that UA did show signs of UTI, and would recommend we start antibiotic.  Pt has had Cipro in the past without issue - will start 500 mg BID x 7 days.  Rx sent to [facility] [place] at pt's request.  Also instructed her to increase water intake.  Will call in 2-3 when culture results are available.  Pt stated her sx's started last night - frequency, but not voiding much; also needing to mildly strain.  Denies dysuria, hematuria, fever/chills.    Pt states she thinks the UTI is making her feel worse today - had a good day yesterday with hardly any HA and improved BP to 133/68.  Today, she has had HA's all day, and BP was 175/96 earlier this am.  Has taken Lisinopril 10 mg daily x past 3 days in the morning.    Recommended she continue the Lisinopril as directed, and monitor BP readings 1-2x's per day.  Hopefully as UTI resolves, she will start to feel better as well.  Pt to call if any sx's worsen.  She stated her understanding.

## 2019-02-04 NOTE — Telephone Encounter (Signed)
Called pt to see if pt could come in 02/20/19 pt states probably will be able to drive that day. Pt states blood pressure was 133/68 on Sunday, then today 02/04/19 it is up at 175/96 pt started the 10mg  Lisinopril Saturday.  Pt states thinks has a UTI so let pt know could put order in at lab and pt could just go to lab and have that done.

## 2019-02-05 MED ORDER — CIPROFLOXACIN HCL 500 MG PO TABS
500 MG | ORAL_TABLET | Freq: Two times a day (BID) | ORAL | 0 refills | Status: DC
Start: 2019-02-05 — End: 2019-02-08

## 2019-02-06 ENCOUNTER — Telehealth

## 2019-02-06 NOTE — Telephone Encounter (Signed)
Pt calling stating she is having a terrible migraine and states she took her BP at 8AM and it was 175/99 and then just took it and it's 161/82, pt questions if she should double up on her medication, please advise at above number

## 2019-02-06 NOTE — Telephone Encounter (Signed)
Took 2 Azo a couple hours ago, which does seem to be helping  Has taken 2 Cipro BID x past 2 days  Not having as much frequency    HA yesterday and today has been worse than normal    BP at 8:00 am today was 2 hours after taking Lisinopril 10 mg;   129/75  (10:30)  152/87 (12:15)  161/82 (1:15)    HA is not quite as bad right now  Flickering in her R eye  Sensitive to light and sound  No nausea, dizziness, numbness/tingling, weakness.      Yesterday afternoon - 133/68    R side, up above her waist - unsure if kidney pain.  Started yesterday; improved today.  Tried some ice.    Taking Tylenol (2) every 6-7 hours; did not help her HA last night

## 2019-02-08 ENCOUNTER — Encounter

## 2019-02-08 ENCOUNTER — Ambulatory Visit: Admit: 2019-02-08 | Discharge: 2019-02-08 | Payer: MEDICARE | Attending: Family Medicine | Primary: Family Medicine

## 2019-02-08 DIAGNOSIS — N3001 Acute cystitis with hematuria: Secondary | ICD-10-CM

## 2019-02-08 MED ORDER — NITROFURANTOIN MONOHYD MACRO 100 MG PO CAPS
100 MG | ORAL_CAPSULE | Freq: Two times a day (BID) | ORAL | 0 refills | Status: DC
Start: 2019-02-08 — End: 2019-04-29

## 2019-02-08 NOTE — Progress Notes (Signed)
[facility] [place]  [address] [place] [address]  [phone]      Katie Juarez is a 79 y.o. female who is c/o of Urinary Tract Infection (ongoing. resistent to cipro via urine culture. )      HPI:     Urinary Tract Infection   Associated symptoms include frequency.   Pt was diagnosed with UTI on 1/25 - started on Cipro 500 mg BID.  However, urine culture results received today confirm that bacteria are resistant to Cipro.    Pt is not yet feeling better - states she is still having to urinate often, and mildly strain to pass urine.    BP today - 131/84 this am at 9:45 am at same time of taking Lisinopril 20 mg; was 148/81 at 1:00 pm.    HA is still on top of her head - better than earlier this week; constant, but waxing/waning.  Still felt "thumping" pain yesterday, but none today.  Sometimes feels it over her eyes in forehead.    Had some flickering in her R eye - lasted a few minutes.  Seemed more sensitive to sound and light.    Taking Tylenol as needed, no more than every 6 hours.      Subjective:      Past Medical History:   Diagnosis Date   ??? 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 Breast cancer ([place]) 1999    in remission   ??? Constipation 12/27/2018   ??? COPD (chronic obstructive pulmonary disease) ([place])    ??? DDD (degenerative disc disease)    ??? Large bowel obstruction ([place]) 02/2015    [facility]   ??? LBP (low back pain)    ??? SS (spinal stenosis)       Past Surgical History:   Procedure Laterality Date   ??? APPENDECTOMY  1956   ??? BLADDER SUSPENSION  1990   ??? BREAST LUMPECTOMY Right 1999   ??? CHOLECYSTECTOMY  1990   ??? EXCISION/BIOPSY Left 11/01/2018    Dr Yehuda Savannah / [name], for [facility]   ??? FEMORAL HERNIA REPAIR Left 02/20/2015    strangulated with small bowel resection - [facility], [place]   ??? [place] for endometriosis; done in [place]   ??? LUMBAR SPINE SURGERY  01/03/2019     VERTEBRAL AUMENTATION  L1     ??? OTHER SURGICAL HISTORY  11/14/13, 06/23/11, 06/30/11, 07/12/11 12/29/11    L4/L5 IESI   ??? OTHER SURGICAL HISTORY  12/17/2013    L5/S1 IESI   ??? OTHER SURGICAL HISTORY Bilateral 05/30/2014    SIJ and piriformis   ??? OTHER SURGICAL HISTORY Bilateral 09/16/2014    L3, L4, L5 Diagnostic Medial Branch Block   ??? OTHER SURGICAL HISTORY Bilateral 09/26/2014    L5 TFE   ??? OTHER SURGICAL HISTORY  10/10/2014    bil L5 TFE   ??? OTHER SURGICAL HISTORY  10/31/2014    caudal   ??? SMALL INTESTINE SURGERY  02/20/2015    strangulated left femoral hernia   ??? SPINE SURGERY N/A 01/03/2019    VERTEBRAL AUMENTATION  L1  (C-ARM X2, performed by Loura Back, MD at [place]   ??? TONSILLECTOMY  1946       Social History     Tobacco Use   ??? Smoking status: Former Smoker  Packs/day: 1.00     Years: 40.00     Pack years: 40.00     Start date: 01/10/1953     Quit date: 01/11/1988     Years since quitting: 31.1   ??? Smokeless tobacco: Never Used   ??? Tobacco comment: quit 25 years ago 1990   Substance Use Topics   ??? Alcohol use: No     Alcohol/week: 0.0 standard drinks      Current Outpatient Medications   Medication Sig Dispense Refill   ??? alendronate (FOSAMAX) 70 MG tablet Take 1 tablet by mouth every 7 days 12 tablet 3   ??? CALCIUM CARBONATE-VITAMIN D PO Take 600 mg by mouth daily     ??? omeprazole (PRILOSEC) 20 MG delayed release capsule Take 1 capsule by mouth every morning (before breakfast) 30 capsule 5   ??? LORazepam (ATIVAN) 0.5 MG tablet TAKE 1 TABLET BY MOUTH NIGHTLY AS NEEDED FOR ANXIETY 90 tablet 0   ??? citalopram (CELEXA) 20 MG tablet Take 1 tablet by mouth daily 90 tablet 3   ??? atorvastatin (LIPITOR) 20 MG tablet Take 1 tablet by mouth daily 90 tablet 0   ??? Multiple Vitamins-Minerals (THERAPEUTIC MULTIVITAMIN-MINERALS) tablet Take 1 tablet by mouth daily     ??? budesonide-formoterol (SYMBICORT) 160-4.5 MCG/ACT AERO Inhale 2 puffs into the lungs 2 times daily Rinse mouth or brush teeth after each use. 3 Inhaler 3    ??? albuterol sulfate HFA (VENTOLIN HFA) 108 (90 Base) MCG/ACT inhaler Ventolin HFA 90 mcg/actuation aerosol inhaler   Inhale 2 puffs every 6 hours by inhalation route as needed.   ASTHMA     ??? docusate sodium (COLACE) 100 MG capsule Take 100 mg by mouth nightly      ??? polyvinyl alcohol-povidone (HYPOTEARS) 1.4-0.6 % ophthalmic solution Place 1-2 drops into both eyes as needed.     ??? lisinopril (PRINIVIL;ZESTRIL) 20 MG tablet Take 1 tablet by mouth daily 90 tablet 1   ??? lisinopril (PRINIVIL;ZESTRIL) 20 MG tablet Take 1 tablet by mouth daily 30 tablet 2     No current facility-administered medications for this visit.      Allergies   Allergen Reactions   ??? Morphine Other (See Comments)     Night sweats,felt "loopy".   ??? Pcn [Penicillins] Hives   ??? Zithromax [Azithromycin] Hives   ??? Adhesive Tape Rash       Review of Systems   Constitutional: Positive for appetite change (decreased since her back surgery).   Gastrointestinal: Negative for constipation (Taking 2 stool softeners at night - bowels are moving every day; soft and easy to pass.).   Genitourinary: Positive for difficulty urinating (mild) and frequency.   Neurological: Positive for headaches.       Objective:     Vitals:    02/08/19 1639   BP: 100/80   Site: Right Upper Arm   Position: Sitting   Cuff Size: Medium Adult   Pulse: 72   Temp: 98.1 ??F (36.7 ??C)   TempSrc: Temporal   SpO2: 97%   Weight: 135 lb (61.2 kg)     Physical Exam  Vitals signs and nursing note reviewed.   Constitutional:       General: She is not in acute distress.     Appearance: She is well-developed.   HENT:      Head: Normocephalic and atraumatic.   Eyes:      Extraocular Movements: Extraocular movements intact.      Conjunctiva/sclera: Conjunctivae normal.  Pupils: Pupils are equal, round, and reactive to light.   Cardiovascular:      Rate and Rhythm: Normal rate and regular rhythm.      Heart sounds: Normal heart sounds.   Pulmonary:      Effort: Pulmonary effort is normal. No  respiratory distress.      Breath sounds: Normal breath sounds.   Abdominal:      General: Bowel sounds are normal. There is no distension.      Palpations: Abdomen is soft.      Tenderness: There is no abdominal tenderness. There is no right CVA tenderness or left CVA tenderness.   Neurological:      General: No focal deficit present.      Mental Status: She is alert and oriented to person, place, and time.      Cranial Nerves: No cranial nerve deficit.   Psychiatric:         Mood and Affect: Mood normal.         Assessment:       Diagnosis Orders   1. Acute cystitis with hematuria  nitrofurantoin, macrocrystal-monohydrate, (MACROBID) 100 MG capsule   2. Essential hypertension     3. Intractable headache, unspecified chronicity pattern, unspecified headache type         Plan:      Will switch antibiotic to Macrobid BID - will treat for extended course, as pt has had for many days already and has systemic symptoms of HA and elevated BP with this.  Will re-check UA after completion of antibiotic course.       Return if symptoms worsen or fail to improve in 3-4 days.    Orders Placed This Encounter   Procedures   ??? Urinalysis Reflex to Culture     Standing Status:   Future     Standing Expiration Date:   02/08/2020     Order Specific Question:   SPECIFY(EX-CATH,MIDSTREAM,CYSTO,ETC)?     Answer:   midstream     Orders Placed This Encounter   Medications   ??? nitrofurantoin, macrocrystal-monohydrate, (MACROBID) 100 MG capsule     Sig: Take 1 capsule by mouth 2 times daily for 10 days     Dispense:  20 capsule     Refill:  0       Patient given educational materials - see patient instructions.  Discussed use, benefit, and side effects of prescribed medications.  All patient questions answered.  Pt voiced understanding.       Electronically signed by [name], DO on 02/24/2019 at 3:28 PM

## 2019-02-11 ENCOUNTER — Telehealth

## 2019-02-11 NOTE — Telephone Encounter (Signed)
Pt is calling to let KB know that she is feeling better and is going to stay on the macrobid for a total of 10 days instead of 7.     Any questions the pt can be reached at the above number

## 2019-02-14 MED ORDER — LISINOPRIL 20 MG PO TABS
20 MG | ORAL_TABLET | Freq: Every day | ORAL | 2 refills | Status: DC
Start: 2019-02-14 — End: 2019-04-18

## 2019-02-14 NOTE — Telephone Encounter (Signed)
Also, does pt want to keep her appt on 2/10 that was made prior to me seeing her in [facility] on 1/29?

## 2019-02-14 NOTE — Telephone Encounter (Signed)
Called pt let pt know Lisinopril was called into to [place] and will let pt know if can get pt in for the vaccine. Pt will also monitor blood pressure.

## 2019-02-14 NOTE — Telephone Encounter (Signed)
Noted - glad pt is already feeling better on the Macrobid.  Please call pt today for another update - how are her HA's doing?  Any updated BP readings?

## 2019-02-14 NOTE — Telephone Encounter (Signed)
Called pt pt is still taking Macrobid, and is feeling better no more headaches, blood pressure today was 148/75 pulse 79 at 9:15am and 124/80 pulse 65 at 11:45am. Pt would like a refill of the Lisinopril pt is taking 20 mg said you changed it, wants that called into [facility] in [place] going to daughters in [place] next week.  Pt has been trying to call and get in to get the covid-19 vaccine but is having trouble they tell pt to call another number pt is going to [place] the 19th and wanted to get it done before, pt stated Dr Lisette Grinder got someone in.    Last labs 01/13/19      [facility] called requesting a refill of the below medication which has been pended for you:     Requested Prescriptions     Pending Prescriptions Disp Refills   ??? lisinopril (PRINIVIL;ZESTRIL) 10 MG tablet 30 tablet 0     Sig: Take 2 tablets by mouth daily       Last Appointment Date: 02/08/2019  Next Appointment Date: 02/20/2019    Allergies   Allergen Reactions   ??? Morphine Other (See Comments)     Night sweats,felt "loopy".   ??? Pcn [Penicillins] Hives   ??? Zithromax [Azithromycin] Hives   ??? Adhesive Tape Rash

## 2019-02-14 NOTE — Telephone Encounter (Signed)
Noted - glad pt is feeling so much better and no longer having HA's.    New Rx for Lisinopril 20 mg tabs sent to [facility].  Pt should monitor BP closely - if she had been running high just in the setting of her UTI and now her BP is going to be improved, I do not want her BP to now drop too low.  She should try to remain between 105/55 - 150/90.      I will check tomorrow about an opening for pt to get Covid vaccine - we talked about her getting it right before she goes to [place], so that she can get the second one when she returns.  Will shoot for ~2/16.

## 2019-02-15 NOTE — Telephone Encounter (Signed)
Katie Juarez says she probably won't need the appt with [place] on Feb 10 but would like to wait till closer to the appt to make sure.  She's currently booked for the Covid vaccination on Feb 18 and will keep that date.

## 2019-02-15 NOTE — Telephone Encounter (Addendum)
Please let pt know - I can either get her in for her Covid vaccine #1 on 2/12 at noon, or 2/18 at 12:20.  Do either of those work for her?     Also see message from yesterday regarding her upcoming OV on 2/10.

## 2019-02-19 NOTE — Telephone Encounter (Signed)
Incoming fax from [employer] requesting to change pt's Lisinopril 20 mg to a 90 day supply.

## 2019-02-20 ENCOUNTER — Encounter: Payer: MEDICARE | Attending: Family Medicine | Primary: Family Medicine

## 2019-02-20 MED ORDER — LISINOPRIL 20 MG PO TABS
20 MG | ORAL_TABLET | Freq: Every day | ORAL | 1 refills | Status: DC
Start: 2019-02-20 — End: 2019-03-18

## 2019-02-26 ENCOUNTER — Encounter: Payer: MEDICARE | Attending: Surgery | Primary: Family Medicine

## 2019-02-28 ENCOUNTER — Ambulatory Visit: Admit: 2019-02-28 | Discharge: 2019-02-28 | Payer: MEDICARE | Primary: Family Medicine

## 2019-02-28 ENCOUNTER — Inpatient Hospital Stay: Admit: 2019-02-28 | Payer: MEDICARE | Primary: Family Medicine

## 2019-02-28 DIAGNOSIS — Z87440 Personal history of urinary (tract) infections: Secondary | ICD-10-CM

## 2019-02-28 LAB — URINALYSIS WITH REFLEX TO CULTURE
Bilirubin Urine: NEGATIVE
Glucose, Ur: NEGATIVE
Ketones, Urine: NEGATIVE
Leukocyte Esterase, Urine: NEGATIVE
Nitrite, Urine: NEGATIVE
Protein, UA: NEGATIVE
Specific Gravity, UA: 1.03 — ABNORMAL HIGH (ref 1.010–1.025)
Urine Hgb: NEGATIVE
Urobilinogen, Urine: NORMAL
pH, UA: 5 (ref 5.0–6.0)

## 2019-02-28 LAB — MICROSCOPIC URINALYSIS
Epithelial Cells UA: 0 /HPF (ref 0–5)
WBC, UA: 0 /HPF (ref 0–4)

## 2019-03-02 LAB — CULTURE, URINE: Culture: NO GROWTH

## 2019-03-18 ENCOUNTER — Encounter

## 2019-03-19 MED ORDER — LISINOPRIL 20 MG PO TABS
20 MG | ORAL_TABLET | Freq: Every day | ORAL | 0 refills | Status: DC
Start: 2019-03-19 — End: 2019-04-18

## 2019-03-19 MED ORDER — LORAZEPAM 0.5 MG PO TABS
0.5 MG | ORAL_TABLET | Freq: Every evening | ORAL | 0 refills | Status: DC | PRN
Start: 2019-03-19 — End: 2019-06-18

## 2019-03-19 MED ORDER — OMEPRAZOLE 20 MG PO CPDR
20 MG | ORAL_CAPSULE | Freq: Every day | ORAL | 0 refills | Status: DC
Start: 2019-03-19 — End: 2019-04-18

## 2019-03-28 ENCOUNTER — Encounter: Payer: MEDICARE | Primary: Family Medicine

## 2019-04-11 ENCOUNTER — Encounter: Primary: Family Medicine

## 2019-04-12 ENCOUNTER — Ambulatory Visit: Admit: 2019-04-12 | Payer: MEDICARE | Primary: Family Medicine

## 2019-04-12 DIAGNOSIS — Z23 Encounter for immunization: Secondary | ICD-10-CM

## 2019-04-16 ENCOUNTER — Institutional Professional Consult (permissible substitution): Admit: 2019-04-16 | Discharge: 2019-04-16 | Payer: MEDICARE | Attending: Surgery | Primary: Family Medicine

## 2019-04-16 DIAGNOSIS — R1312 Dysphagia, oropharyngeal phase: Secondary | ICD-10-CM

## 2019-04-16 NOTE — Progress Notes (Signed)
Patient has episodes of coughing follow by spontaneous regurgitation.  Voice is more hoarse than it used to be.  Also has a sore throat. This occurs several times a day, last for few minutes. It can happen when she is sleeping causing her to choke.  Can be triggered by eating or drink.  She does get the feeling of food getting stuck in her throat.  She has to eat slowly to prevent.      No nausea or vomiting.  No heart burn or induration.  She has lost her appetite, and lost 10 -12 lbs since Christmas.  Takes one Ensure a day to help    Has problems with drinking too.  Causing coughing and regurgitation.  More likely with cold fluids like ice water.    No history of ulcers, gastritis or GERD.  She does take omeprazole.  She is not sure that the omeprazole helps.    Past Medical History:   Diagnosis Date   ??? 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 Breast cancer ([place]) 1999    in remission   ??? Constipation 12/27/2018   ??? COPD (chronic obstructive pulmonary disease) ([place])    ??? DDD (degenerative disc disease)    ??? Large bowel obstruction ([place]) 02/2015    [facility]   ??? LBP (low back pain)    ??? SS (spinal stenosis)      Past Surgical History:   Procedure Laterality Date   ??? APPENDECTOMY  1956   ??? BLADDER SUSPENSION  1990   ??? BREAST LUMPECTOMY Right 1999   ??? CHOLECYSTECTOMY  1990   ??? EXCISION/BIOPSY Left 11/01/2018    Dr Yehuda Savannah / [name], for [facility]   ??? FEMORAL HERNIA REPAIR Left 02/20/2015    strangulated with small bowel resection - [facility], [place]   ??? [place] for endometriosis; done in [place]   ??? LUMBAR SPINE SURGERY  01/03/2019    VERTEBRAL AUMENTATION  L1     ??? OTHER SURGICAL HISTORY  11/14/13, 06/23/11, 06/30/11, 07/12/11 12/29/11    L4/L5 IESI   ??? OTHER SURGICAL HISTORY  12/17/2013    L5/S1 IESI   ??? OTHER SURGICAL HISTORY Bilateral 05/30/2014    SIJ and piriformis   ??? OTHER SURGICAL HISTORY Bilateral 09/16/2014    L3, L4, L5 Diagnostic  Medial Branch Block   ??? OTHER SURGICAL HISTORY Bilateral 09/26/2014    L5 TFE   ??? OTHER SURGICAL HISTORY  10/10/2014    bil L5 TFE   ??? OTHER SURGICAL HISTORY  10/31/2014    caudal   ??? SMALL INTESTINE SURGERY  02/20/2015    strangulated left femoral hernia   ??? SPINE SURGERY N/A 01/03/2019    VERTEBRAL AUMENTATION  L1  (C-ARM X2, performed by Loura Back, MD at [place]   ??? TONSILLECTOMY  1946     Current Outpatient Medications   Medication Sig Dispense Refill   ??? aspirin 81 MG EC tablet Take 81 mg by mouth daily Indications: takes irregularly      ??? omeprazole (PRILOSEC) 20 MG delayed release capsule Take 1 capsule by mouth every morning (before breakfast) 30 capsule 0   ??? LORazepam (ATIVAN) 0.5 MG tablet Take 1 tablet by mouth nightly as needed for Anxiety for up to 90 days. 90 tablet 0   ??? lisinopril (PRINIVIL;ZESTRIL) 20 MG tablet Take 1 tablet by mouth  daily 30 tablet 0   ??? lisinopril (PRINIVIL;ZESTRIL) 20 MG tablet Take 1 tablet by mouth daily 30 tablet 2   ??? alendronate (FOSAMAX) 70 MG tablet Take 1 tablet by mouth every 7 days 12 tablet 3   ??? CALCIUM CARBONATE-VITAMIN D PO Take 600 mg by mouth daily     ??? citalopram (CELEXA) 20 MG tablet Take 1 tablet by mouth daily 90 tablet 3   ??? atorvastatin (LIPITOR) 20 MG tablet Take 1 tablet by mouth daily 90 tablet 0   ??? Multiple Vitamins-Minerals (THERAPEUTIC MULTIVITAMIN-MINERALS) tablet Take 1 tablet by mouth daily     ??? budesonide-formoterol (SYMBICORT) 160-4.5 MCG/ACT AERO Inhale 2 puffs into the lungs 2 times daily Rinse mouth or brush teeth after each use. 3 Inhaler 3   ??? albuterol sulfate HFA (VENTOLIN HFA) 108 (90 Base) MCG/ACT inhaler Ventolin HFA 90 mcg/actuation aerosol inhaler   Inhale 2 puffs every 6 hours by inhalation route as needed.   ASTHMA     ??? docusate sodium (COLACE) 100 MG capsule Take 100 mg by mouth nightly      ??? polyvinyl alcohol-povidone (HYPOTEARS) 1.4-0.6 % ophthalmic solution Place 1-2 drops into both eyes as needed.       No  current facility-administered medications for this visit.      Allergies   Allergen Reactions   ??? Morphine Other (See Comments)     Night sweats,felt "loopy".   ??? Pcn [Penicillins] Hives   ??? Zithromax [Azithromycin] Hives   ??? Adhesive Tape Rash     Social History     Tobacco Use   ??? Smoking status: Former Smoker     Packs/day: 1.00     Years: 40.00     Pack years: 40.00     Start date: 01/10/1953     Quit date: 01/11/1988     Years since quitting: 31.2   ??? Smokeless tobacco: Never Used   ??? Tobacco comment: quit 25 years ago 1990   Substance Use Topics   ??? Alcohol use: No     Alcohol/week: 0.0 standard drinks   ??? Drug use: Never     Family History   Problem Relation Age of Onset   ??? Other Mother         ALS - diagnosed at age 67   ??? Stroke Father         age 5   ??? Cancer Sister         pt thinks it was liver   ??? Cancer Brother         leukemia   ??? Diabetes Maternal Grandfather    ??? Other Brother         drowned   ??? Other Brother         died at age 78; think due to spinal meningitis   ??? Lupus Sister    ??? Alzheimer's Disease Sister    ??? Dementia Sister      Review of Systems   Constitutional: Positive for appetite change, diaphoresis, fatigue and unexpected weight change. Negative for activity change and fever.   HENT: Positive for hearing loss, rhinorrhea, sneezing, sore throat, trouble swallowing and voice change. Negative for dental problem, drooling, ear pain, postnasal drip and sinus pain.    Eyes: Negative for visual disturbance.   Respiratory: Positive for cough, choking and shortness of breath. Negative for wheezing.    Cardiovascular: Negative for chest pain and palpitations.   Gastrointestinal: Negative for abdominal distention, abdominal pain,  blood in stool, constipation, diarrhea, nausea and rectal pain.   Endocrine: Negative for polydipsia, polyphagia and polyuria.   Genitourinary: Negative for difficulty urinating, dysuria and frequency.        Has issues with UTI/s   Musculoskeletal: Positive for back  pain. Negative for arthralgias and joint swelling.   Skin: Negative for rash and wound.   Neurological: Positive for dizziness, tremors, speech difficulty and weakness ( she gets left sided weakness 2 - 3 times a week). Negative for light-headedness, numbness and headaches.        Also feels she has memory problems   Hematological: Negative for adenopathy. Does not bruise/bleed easily.   Psychiatric/Behavioral: Positive for decreased concentration and dysphoric mood. Negative for agitation, behavioral problems and confusion. The patient is nervous/anxious.         Her husband died in 09-25-2022     BP 120/66    Pulse 60    Temp 99.2 ??F (37.3 ??C) (Tympanic)    Ht 5\' 4"  (1.626 m)    Wt 132 lb (59.9 kg)    LMP  (LMP Unknown)    BMI 22.66 kg/m??     Physical Exam  Constitutional:       General: She is not in acute distress.     Appearance: She is normal weight. She is not ill-appearing, toxic-appearing or diaphoretic.   HENT:      Mouth/Throat:      Mouth: Mucous membranes are moist.      Pharynx: Oropharynx is clear. No oropharyngeal exudate.   Eyes:      General: No scleral icterus.     Extraocular Movements: Extraocular movements intact.      Conjunctiva/sclera: Conjunctivae normal.      Pupils: Pupils are equal, round, and reactive to light.   Neck:      Musculoskeletal: Normal range of motion. No neck rigidity.   Cardiovascular:      Rate and Rhythm: Normal rate and regular rhythm.      Pulses: Normal pulses.           Carotid pulses are 2+ on the right side with bruit and 2+ on the left side with bruit.       Radial pulses are 2+ on the right side and 2+ on the left side.        Femoral pulses are 2+ on the right side and 2+ on the left side.     Heart sounds: Normal heart sounds.   Pulmonary:      Effort: Pulmonary effort is normal. No respiratory distress.      Breath sounds: Normal breath sounds. No wheezing or rhonchi.   Abdominal:      General: Abdomen is flat. A surgical scar is present. Bowel sounds are normal.  There is no distension or abdominal bruit.      Palpations: Abdomen is soft. There is no hepatomegaly, splenomegaly, mass or pulsatile mass.      Tenderness: There is no abdominal tenderness.      Hernia: No hernia is present. There is no hernia in the umbilical area, ventral area, left inguinal area, right femoral area, left femoral area or right inguinal area.       Lymphadenopathy:      Head:      Right side of head: No submental, submandibular, tonsillar, preauricular, posterior auricular or occipital adenopathy.      Left side of head: No submental, submandibular, tonsillar, preauricular, posterior auricular or occipital adenopathy.  Cervical: No cervical adenopathy.      Right cervical: No superficial, deep or posterior cervical adenopathy.     Left cervical: No deep or posterior cervical adenopathy.      Upper Body:      Right upper body: No supraclavicular or axillary adenopathy.      Left upper body: No supraclavicular or axillary adenopathy.      Lower Body: No right inguinal adenopathy. No left inguinal adenopathy.   Skin:     General: Skin is warm and dry.   Neurological:      Mental Status: She is alert and oriented to person, place, and time.      Coordination: Finger-Nose-Finger Test abnormal.      Gait: Gait is intact. Gait normal.      Comments: Perhaps a little left sided facial droop    Generalized tremor with marked intention tremor.   Psychiatric:         Mood and Affect: Mood normal.         Behavior: Behavior normal.         Thought Content: Thought content normal.         Judgment: Judgment normal.        She has swallowing study 2 year ago which was okay.  Lab work from January was okay    IMP/PLAN  1) Dysphagia - sounds oropharyngeal too me, but she had similar symptoms 2 years ago and speech therapy work up was negative.  I still think it sounds like swallowing triggers her coughing.  She has not had an EGD. Will proceed with EGD and if it is okay, she should have neurology evaluation.   She has a lot of findings and history which suggest neurologic problems, dizziness, unsteadiness on her feet, tremor, generalized weakness, left sided weakness (by history) and her rather pronounced baseline and intention tremor.    She is at risk for ENT tumor and esophageal cancer as a former smoker.  This could also be a manifestation of her COPD    Risks and benefits of EGD (upper G.I. Endoscopy) were discussed with Roselyn Reef Higuchi.  In particular I discussed the nature and limitations of the procedure. I also discussed the risks and consequences of reactions to the sedatives, bleeding and perforation.  Alternate ways of evaluating the upper g.i tract were also discussed including barium swallow and CT scan were discussed.  she was also given the opportunity to have any questions answered, and encouraged to call the office with additional issues.

## 2019-04-16 NOTE — Telephone Encounter (Addendum)
[facility]         Patient:Katie Juarez           DOB:02-16-1940           Surgical/Procedure Planned: EGD    Date & Location: [facility] on 04/24/19       Outpatient   Planned Length of OR: 20 min.    Sedation: intravenous sedation        Estimated Cardiac Risk for Non-Cardiac Surgery/Procedure     Low______ Moderate______ High______    Medication Instructions - Clarification needed by this date:         ASA 81 mg/325 mg Hold _2__ Days          Provider:Dr. Blanca Friend      Signature of Provider Giving Orders for Medication holds:    ________Karin Lurlean Leyden, DO__________________

## 2019-04-16 NOTE — Patient Instructions (Signed)
Follow instructions for EGD scheduled at [facility] on 04/24/19

## 2019-04-17 ENCOUNTER — Encounter

## 2019-04-17 NOTE — Telephone Encounter (Signed)
[facility] called requesting a refill of the below medication which has been pended for you:     Requested Prescriptions     Pending Prescriptions Disp Refills   ??? omeprazole (PRILOSEC) 20 MG delayed release capsule 30 capsule 0     Sig: Take 1 capsule by mouth every morning (before breakfast)   ??? lisinopril (PRINIVIL;ZESTRIL) 20 MG tablet 30 tablet 0     Sig: Take 1 tablet by mouth daily       Last Appointment Date: 02/08/2019  Next Appointment Date: 04/29/2019    Allergies   Allergen Reactions   ??? Morphine Other (See Comments)     Night sweats,felt "loopy".   ??? Pcn [Penicillins] Hives   ??? Zithromax [Azithromycin] Hives   ??? Adhesive Tape Rash

## 2019-04-17 NOTE — Telephone Encounter (Signed)
Spoke with patient regarding a covid swab appt for April 9th at 1100. Instructions given, verbalizes understanding.

## 2019-04-18 MED ORDER — LISINOPRIL 20 MG PO TABS
20 MG | ORAL_TABLET | Freq: Every day | ORAL | 1 refills | Status: DC
Start: 2019-04-18 — End: 2019-07-30

## 2019-04-18 MED ORDER — OMEPRAZOLE 20 MG PO CPDR
20 MG | ORAL_CAPSULE | Freq: Every day | ORAL | 3 refills | Status: DC
Start: 2019-04-18 — End: 2019-04-29

## 2019-04-19 ENCOUNTER — Inpatient Hospital Stay: Admit: 2019-04-19 | Payer: MEDICARE | Primary: Family Medicine

## 2019-04-19 DIAGNOSIS — Z20822 Contact with and (suspected) exposure to covid-19: Secondary | ICD-10-CM

## 2019-04-21 LAB — COVID-19: SARS-CoV-2: NOT DETECTED

## 2019-04-22 NOTE — Telephone Encounter (Signed)
Patient notified. Verbalized understanding.

## 2019-04-22 NOTE — Telephone Encounter (Signed)
I spoke with patient this morning. She states that she has already been holding ASA. She states that she takes it 2-3 times a week.

## 2019-04-22 NOTE — Telephone Encounter (Signed)
Patient notified of Dr. Alfonso Ramus recommendations.

## 2019-04-23 NOTE — H&P (Signed)
Patient has episodes of coughing follow by spontaneous regurgitation.  Voice is more hoarse than it used to be.  Also has a sore throat. This occurs several times a day, last for few minutes. It can happen when she is sleeping causing her to choke.  Can be triggered by eating or drink.  She does get the feeling of food getting stuck in her throat.  She has to eat slowly to prevent.      No nausea or vomiting.  No heart burn or induration.  She has lost her appetite, and lost 10 -12 lbs since Christmas.  Takes one Ensure a day to help    Has problems with drinking too.  Causing coughing and regurgitation.  More likely with cold fluids like ice water.    No history of ulcers, gastritis or GERD.  She does take omeprazole.  She is not sure that the omeprazole helps.    Past Medical History:   Diagnosis Date   ??? 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 Breast cancer ([place]) 1999    in remission   ??? Constipation 12/27/2018   ??? COPD (chronic obstructive pulmonary disease) ([place])    ??? DDD (degenerative disc disease)    ??? Large bowel obstruction ([place]) 02/2015    [facility]   ??? LBP (low back pain)    ??? SS (spinal stenosis)      Past Surgical History:   Procedure Laterality Date   ??? APPENDECTOMY  1956   ??? BLADDER SUSPENSION  1990   ??? BREAST LUMPECTOMY Right 1999   ??? CHOLECYSTECTOMY  1990   ??? EXCISION/BIOPSY Left 11/01/2018    Dr Yehuda Savannah / [name], for [facility]   ??? FEMORAL HERNIA REPAIR Left 02/20/2015    strangulated with small bowel resection - [facility], [place]   ??? [place] for endometriosis; done in [place]   ??? LUMBAR SPINE SURGERY  01/03/2019    VERTEBRAL AUMENTATION  L1     ??? OTHER SURGICAL HISTORY  11/14/13, 06/23/11, 06/30/11, 07/12/11 12/29/11    L4/L5 IESI   ??? OTHER SURGICAL HISTORY  12/17/2013    L5/S1 IESI   ??? OTHER SURGICAL HISTORY Bilateral 05/30/2014    SIJ and piriformis   ??? OTHER SURGICAL HISTORY Bilateral 09/16/2014    L3, L4, L5 Diagnostic  Medial Branch Block   ??? OTHER SURGICAL HISTORY Bilateral 09/26/2014    L5 TFE   ??? OTHER SURGICAL HISTORY  10/10/2014    bil L5 TFE   ??? OTHER SURGICAL HISTORY  10/31/2014    caudal   ??? SMALL INTESTINE SURGERY  02/20/2015    strangulated left femoral hernia   ??? SPINE SURGERY N/A 01/03/2019    VERTEBRAL AUMENTATION  L1  (C-ARM X2, performed by Loura Back, MD at [place]   ??? TONSILLECTOMY  1946     No current facility-administered medications for this encounter.      Current Outpatient Medications   Medication Sig Dispense Refill   ??? omeprazole (PRILOSEC) 20 MG delayed release capsule Take 1 capsule by mouth every morning (before breakfast) 90 capsule 3   ??? lisinopril (PRINIVIL;ZESTRIL) 20 MG tablet Take 1 tablet by mouth daily 90 tablet 1   ??? aspirin 81 MG EC tablet Take 81 mg by mouth daily Indications: takes irregularly      ??? LORazepam (ATIVAN) 0.5 MG tablet Take 1  tablet by mouth nightly as needed for Anxiety for up to 90 days. 90 tablet 0   ??? alendronate (FOSAMAX) 70 MG tablet Take 1 tablet by mouth every 7 days 12 tablet 3   ??? CALCIUM CARBONATE-VITAMIN D PO Take 600 mg by mouth daily     ??? citalopram (CELEXA) 20 MG tablet Take 1 tablet by mouth daily 90 tablet 3   ??? atorvastatin (LIPITOR) 20 MG tablet Take 1 tablet by mouth daily 90 tablet 0   ??? Multiple Vitamins-Minerals (THERAPEUTIC MULTIVITAMIN-MINERALS) tablet Take 1 tablet by mouth daily     ??? budesonide-formoterol (SYMBICORT) 160-4.5 MCG/ACT AERO Inhale 2 puffs into the lungs 2 times daily Rinse mouth or brush teeth after each use. 3 Inhaler 3   ??? albuterol sulfate HFA (VENTOLIN HFA) 108 (90 Base) MCG/ACT inhaler Ventolin HFA 90 mcg/actuation aerosol inhaler   Inhale 2 puffs every 6 hours by inhalation route as needed.   ASTHMA     ??? docusate sodium (COLACE) 100 MG capsule Take 100 mg by mouth nightly      ??? polyvinyl alcohol-povidone (HYPOTEARS) 1.4-0.6 % ophthalmic solution Place 1-2 drops into both eyes as needed.       Allergies   Allergen  Reactions   ??? Morphine Other (See Comments)     Night sweats,felt "loopy".   ??? Pcn [Penicillins] Hives   ??? Zithromax [Azithromycin] Hives   ??? Adhesive Tape Rash     Social History     Tobacco Use   ??? Smoking status: Former Smoker     Packs/day: 1.00     Years: 40.00     Pack years: 40.00     Start date: 01/10/1953     Quit date: 01/11/1988     Years since quitting: 31.3   ??? Smokeless tobacco: Never Used   ??? Tobacco comment: quit 25 years ago 1990   Substance Use Topics   ??? Alcohol use: No     Alcohol/week: 0.0 standard drinks   ??? Drug use: Never     Family History   Problem Relation Age of Onset   ??? Other Mother         ALS - diagnosed at age 66   ??? Stroke Father         age 31   ??? Cancer Sister         pt thinks it was liver   ??? Cancer Brother         leukemia   ??? Diabetes Maternal Grandfather    ??? Other Brother         drowned   ??? Other Brother         died at age 82; think due to spinal meningitis   ??? Lupus Sister    ??? Alzheimer's Disease Sister    ??? Dementia Sister      Review of Systems   Constitutional: Positive for appetite change, diaphoresis, fatigue and unexpected weight change. Negative for activity change and fever.   HENT: Positive for hearing loss, rhinorrhea, sneezing, sore throat, trouble swallowing and voice change. Negative for dental problem, drooling, ear pain, postnasal drip and sinus pain.    Eyes: Negative for visual disturbance.   Respiratory: Positive for cough, choking and shortness of breath. Negative for wheezing.    Cardiovascular: Negative for chest pain and palpitations.   Gastrointestinal: Negative for abdominal distention, abdominal pain, blood in stool, constipation, diarrhea, nausea and rectal pain.   Endocrine: Negative for polydipsia, polyphagia and  polyuria.   Genitourinary: Negative for difficulty urinating, dysuria and frequency.        Has issues with UTI/s   Musculoskeletal: Positive for back pain. Negative for arthralgias and joint swelling.   Skin: Negative for rash and wound.    Neurological: Positive for dizziness, tremors, speech difficulty and weakness ( she gets left sided weakness 2 - 3 times a week). Negative for light-headedness, numbness and headaches.        Also feels she has memory problems   Hematological: Negative for adenopathy. Does not bruise/bleed easily.   Psychiatric/Behavioral: Positive for decreased concentration and dysphoric mood. Negative for agitation, behavioral problems and confusion. The patient is nervous/anxious.         Her husband died in September 11, 2022     LMP  (LMP Unknown)     Physical Exam  Constitutional:       General: She is not in acute distress.     Appearance: She is normal weight. She is not ill-appearing, toxic-appearing or diaphoretic.   HENT:      Mouth/Throat:      Mouth: Mucous membranes are moist.      Pharynx: Oropharynx is clear. No oropharyngeal exudate.   Eyes:      General: No scleral icterus.     Extraocular Movements: Extraocular movements intact.      Conjunctiva/sclera: Conjunctivae normal.      Pupils: Pupils are equal, round, and reactive to light.   Neck:      Musculoskeletal: Normal range of motion. No neck rigidity.   Cardiovascular:      Rate and Rhythm: Normal rate and regular rhythm.      Pulses: Normal pulses.           Carotid pulses are 2+ on the right side with bruit and 2+ on the left side with bruit.       Radial pulses are 2+ on the right side and 2+ on the left side.        Femoral pulses are 2+ on the right side and 2+ on the left side.     Heart sounds: Normal heart sounds.   Pulmonary:      Effort: Pulmonary effort is normal. No respiratory distress.      Breath sounds: Normal breath sounds. No wheezing or rhonchi.   Abdominal:      General: Abdomen is flat. A surgical scar is present. Bowel sounds are normal. There is no distension or abdominal bruit.      Palpations: Abdomen is soft. There is no hepatomegaly, splenomegaly, mass or pulsatile mass.      Tenderness: There is no abdominal tenderness.      Hernia: No hernia  is present. There is no hernia in the umbilical area, ventral area, left inguinal area, right femoral area, left femoral area or right inguinal area.       Lymphadenopathy:      Head:      Right side of head: No submental, submandibular, tonsillar, preauricular, posterior auricular or occipital adenopathy.      Left side of head: No submental, submandibular, tonsillar, preauricular, posterior auricular or occipital adenopathy.      Cervical: No cervical adenopathy.      Right cervical: No superficial, deep or posterior cervical adenopathy.     Left cervical: No deep or posterior cervical adenopathy.      Upper Body:      Right upper body: No supraclavicular or axillary adenopathy.      Left  upper body: No supraclavicular or axillary adenopathy.      Lower Body: No right inguinal adenopathy. No left inguinal adenopathy.   Skin:     General: Skin is warm and dry.   Neurological:      Mental Status: She is alert and oriented to person, place, and time.      Coordination: Finger-Nose-Finger Test abnormal.      Gait: Gait is intact. Gait normal.      Comments: Perhaps a little left sided facial droop    Generalized tremor with marked intention tremor.   Psychiatric:         Mood and Affect: Mood normal.         Behavior: Behavior normal.         Thought Content: Thought content normal.         Judgment: Judgment normal.        She has swallowing study 2 year ago which was okay.  Lab work from January was okay    IMP/PLAN  1) Dysphagia - sounds oropharyngeal too me, but she had similar symptoms 2 years ago and speech therapy work up was negative.  I still think it sounds like swallowing triggers her coughing.  She has not had an EGD. Will proceed with EGD and if it is okay, she should have neurology evaluation.  She has a lot of findings and history which suggest neurologic problems, dizziness, unsteadiness on her feet, tremor, generalized weakness, left sided weakness (by history) and her rather pronounced baseline and  intention tremor.    She is at risk for ENT tumor and esophageal cancer as a former smoker.  This could also be a manifestation of her COPD    Risks and benefits of EGD (upper G.I. Endoscopy) were discussed with Roselyn Reef Bellino.  In particular I discussed the nature and limitations of the procedure. I also discussed the risks and consequences of reactions to the sedatives, bleeding and perforation.  Alternate ways of evaluating the upper g.i tract were also discussed including barium swallow and CT scan were discussed.  she was also given the opportunity to have any questions answered, and encouraged to call the office with additional issues.

## 2019-04-24 ENCOUNTER — Inpatient Hospital Stay: Payer: MEDICARE

## 2019-04-24 ENCOUNTER — Ambulatory Visit: Admit: 2019-04-24 | Primary: Family Medicine

## 2019-04-24 MED ORDER — PROPOFOL 200 MG/20ML IV EMUL
200 MG/20ML | INTRAVENOUS | Status: DC | PRN
Start: 2019-04-24 — End: 2019-04-24
  Administered 2019-04-24: 14:00:00 150 via INTRAVENOUS

## 2019-04-24 MED ORDER — LIDOCAINE HCL (PF) 4 % IJ SOLN
4 % | INTRAMUSCULAR | Status: DC | PRN
Start: 2019-04-24 — End: 2019-04-24
  Administered 2019-04-24: 14:00:00 120 via INTRAVENOUS

## 2019-04-24 MED ORDER — LACTATED RINGERS IV SOLN
INTRAVENOUS | Status: DC
Start: 2019-04-24 — End: 2019-04-24
  Administered 2019-04-24 (×2): via INTRAVENOUS

## 2019-04-24 NOTE — Op Note (Signed)
Operative Note      Patient: Katie Juarez  Date of Birth: [birth year]  MRN: [id number]    Date of Procedure: 04/24/2019    Pre-Op Diagnosis: oropharyngeal dysphagia    Post-Op Diagnosis: Same       Procedure(s):  EGD BIOPSY    Surgeon(s):  Monna Fam, MD    Assistant:   * No surgical staff found *    Anesthesia: General    Estimated Blood Loss (mL): Minimal    Complications: None    Specimens:   ID Type Source Tests Collected by Time Destination   A : DISTAL ESOPHAGUS BX Tissue Esophagus SURGICAL PATHOLOGY Monna Fam, MD 04/24/2019 0945        Implants:  * No implants in log *      Drains: * No LDAs found *    Findings: Patient was brought to the endoscopy area and IV sedation was induced by the CRNA.  Helps with her mouth on her esophagus into her stomach and duodenum.  Grossly everything looked pretty normal.  There is certainly no obvious stenosis of her esophagus nor any obvious inflammation.  I did go ahead and do quite a few biopsies of distal esophagus to look and see if there is underlying inflammation but I would have been suspicious from the beginning that this is a neurologic problem.  Will see the biopsy results show and then probably have her see a neurologist if the biopsies are okay.  Electronically signed by Monna Fam, MD on 04/24/2019 at 9:48 AM

## 2019-04-24 NOTE — Discharge Instructions (Signed)
1) Your scope looked pretty normal  2) Biopsies were done.  You will be called with those results  3) If the biopsies are okay, there are additional evaluations that can be done.  I suggest a neurology consultation as the next step.        Discharge Instructions for EGD     An EGD or upper GI endoscopy is a visual exam of the lining of the esophagus, stomach (gastric) and duodenum (the first part of the small intestine). An endoscopy is a flexible tube with a light and a viewing device. It allows the doctor to view the inside of the colon through a tiny video camera.   EGD is performed for many reasons: unexplained anemia , pain, bleeding, heart burn, among many other reasons.   Complications from a EGD are rare. Some possible serious complications include perforated bowel (which might require surgery) and bleeding (which could require blood transfusion ). Minor complications include bloating, gas, and cramping that can last for 1-2 days after the procedure.     Because air is put into your upper GI during the procedure, it is normal to pass large amounts of air from your rectum and burp a lot.     What You Will Need    Someone to drive you home after the procedure    Steps to [place] when you get home.     Because the sedative will make you drowsy, don't drive, operate machinery, or make important decisions the day of the procedure.      Feelings of bloating, gas, or cramping may persist for 24 hours.   Diet     Try sips of water first. If tolerated, resume regular diet or the diet recommended by your physician.     Do not drink alcohol for 24 hours.    Physical Activity     You may return to work tomorrow.     Do not drive, operate heavy machinery, or do activities that require coordination or balance until tomorrow   Otherwise, return to your normal routine as soon as you are comfortable to do so, which is usually the next day after the procedure.    Medications    When taking  medications, it's important to:    Take your medication as directednot more, not less, not at a different time.    Do not stop taking them without consulting your healthcare provider.    Don't share them with anyone else.    Know what effects and side effects to expect, and report them to your healthcare provider.    If you are taking more than one drug, even if it is an over-the-counter medication, herb, or dietary supplement, be sure to check with a physician or pharmacist about drug interactions.    Plan ahead for refills so you don't run out.     Follow-up    You will be called with your results usually within a week or   We will have you make a follow up appointment if needed   You are always welcome to follow up in person if you have questions or there are other issue you would like addressed      Call Your Doctor If Any of the Following Occurs   Monitor your recovery once you leave the hospital. As soon as you have a problem, alert your doctor. If any of the following occur, call your doctor or come  to the emergency room   Bleeding from your rectum; notify your doctor if you pass a teaspoonful or more of blood    Black, tarry stools    Severe abdominal or chest pain pain    Hard, swollen abdomen    Signs of infection, including fever or chills    Inability to pass gas or stool    Coughing, shortness of breath, chest pain, severe nausea or vomiting   In case of an emergency, call 911 immediately.

## 2019-04-24 NOTE — Progress Notes (Signed)
Dr pruitt speaks with patient and son in law regarding test results and plan of care

## 2019-04-25 LAB — SURGICAL PATHOLOGY

## 2019-04-26 ENCOUNTER — Encounter

## 2019-04-26 ENCOUNTER — Encounter: Attending: Dermatology | Primary: Family Medicine

## 2019-04-29 ENCOUNTER — Inpatient Hospital Stay: Payer: MEDICARE | Primary: Family Medicine

## 2019-04-29 ENCOUNTER — Ambulatory Visit: Admit: 2019-04-29 | Discharge: 2019-04-29 | Payer: MEDICARE | Attending: Family Medicine | Primary: Family Medicine

## 2019-04-29 DIAGNOSIS — R39198 Other difficulties with micturition: Secondary | ICD-10-CM

## 2019-04-29 DIAGNOSIS — I10 Essential (primary) hypertension: Secondary | ICD-10-CM

## 2019-04-29 LAB — MICROSCOPIC URINALYSIS
Epithelial Cells UA: 0 /HPF (ref 0–5)
RBC, UA: 0 /HPF (ref 0–4)
WBC, UA: 10 /HPF (ref 0–4)

## 2019-04-29 LAB — URINALYSIS WITH REFLEX TO CULTURE
Bilirubin Urine: NEGATIVE
Glucose, Ur: NEGATIVE
Ketones, Urine: NEGATIVE
Nitrite, Urine: NEGATIVE
Protein, UA: NEGATIVE
Specific Gravity, UA: 1.02 (ref 1.010–1.025)
Urobilinogen, Urine: NORMAL
pH, UA: 6.5 — ABNORMAL HIGH (ref 5.0–6.0)

## 2019-04-29 MED ORDER — NITROFURANTOIN MONOHYD MACRO 100 MG PO CAPS
100 MG | ORAL_CAPSULE | Freq: Two times a day (BID) | ORAL | 0 refills | Status: AC
Start: 2019-04-29 — End: 2019-05-09

## 2019-04-29 MED ORDER — BUDESONIDE-FORMOTEROL FUMARATE 160-4.5 MCG/ACT IN AERO
Freq: Two times a day (BID) | RESPIRATORY_TRACT | 3 refills | Status: DC
Start: 2019-04-29 — End: 2020-04-13

## 2019-04-29 MED ORDER — FLUCONAZOLE 150 MG PO TABS
150 MG | ORAL_TABLET | ORAL | 0 refills | Status: DC
Start: 2019-04-29 — End: 2019-05-20

## 2019-04-29 NOTE — Patient Instructions (Signed)
Patient Education        Benign Essential Tremor: Care Instructions  Your Care Instructions     Benign essential tremor is a medical term for shaking that you can't control. Your hand or fingers may shake when you lift a cup or point at something. Or your voice may shake when you speak. This type of tremor is not harmful. It is not caused by a stroke or Parkinson's disease.  Some things can affect how much you shake. For example, drinking or eating something with caffeine may make tremors worse for a while. Some medicines also can increase tremors. These include antidepressants and too much thyroid replacement. Talk to your doctor if you think one of your medicines makes your tremors worse.  If you are self-conscious about your tremors, there are some things you can do to reduce them or make them less noticeable. This includes taking medicine.  Follow-up care is a key part of your treatment and safety. Be sure to make and go to all appointments, and call your doctor if you are having problems. It's also a good idea to know your test results and keep a list of the medicines you take.  How can you care for yourself at home?  ?? Take your medicines exactly as prescribed. Call your doctor if you think you are having a problem with your medicine. Some medicines that help control tremors have to be taken every day, even if you are not having tremors. You will get more details on the specific medicines your doctor prescribes.  ?? Get plenty of rest.  ?? Eat a balanced, healthy diet.  ?? Try to reduce stress. Regular exercise and massages may help.  ?? Limit alcohol. Heavy drinking can make your tremors worse.  ?? Avoid drinks or foods with caffeine if they make your tremors worse. These include tea, cola, coffee, and chocolate.  ?? Wear a heavy bracelet or watch. This adds a little weight to your hand. The extra weight may reduce tremors.  ?? Drink from cups or glasses that are only half full. You may also want to try drinking  with a straw.  When should you call for help?  Watch closely for changes in your health, and be sure to contact your doctor if:  ?? ?? You notice your tremors are getting worse.   ?? ?? You can't do your everyday activities because of your tremors.   ?? ?? You are sad and embarrassed about your shaking.   Where can you learn more?  Go to [email or web address] and sign in to your MyChart account. Enter B746 in the Search Health Information box to learn more about "Benign Essential Tremor: Care Instructions."     If you do not have an account, please click on the "Sign Up Now" link.  Current as of: August 14, 2018??????????????????????????????Content Version: 12.8  ?? 2006-2021 [employer].   Care instructions adapted under license by [facility]. If you have questions about a medical condition or this instruction, always ask your healthcare professional. [employer], Inc[employer] disclaims any warranty or liability for your use of this information.

## 2019-04-29 NOTE — Progress Notes (Signed)
[facility]  [address] [place] [address]  [phone]      Katie Juarez is a 79 y.o. female who presents today for her medical conditions/complaints as noted below.  Katie Juarez is c/o of Other (f/u mood )      HPI:     Pt here today for follow-up of anxiety and HTN.    BP slightly low today - 84/62.  States that she feels more tired, but that is not new; also feels unsteady a little bit at all times when walking, but that is also not new.  Has not had any recent HA's.    Had EGD with biopsies on 4/14 with Dr. Blanca Friend - on examination, the esophagus appeared normal, and all biopsies were normal.  Dr. Blanca Friend told pt that she could stop her Omeprazole, as she had no signs of reflux.  Stopped it 3-4 days ago - no change noted yet.      Still coughing and bringing up a "bubbly" white liquid - still worse at night.  When she lays down in bed, she will start coughing and bring up the fluid; happens maybe a couple times during the day.      Head has been shaking more in the past year; sometimes even feels like her lips are "quivering".  Cannot improve it or make it stop.  No pattern with the shaking.  Has intermittent hand tremors that she sometimes notices when she is painting tiny details (likes to paint [place]).  Notices it when she eats.  Her handwriting has been worse x past few months - looks more shaky.    Sometimes gets pains down the sides of her lower legs - will happen on one side or the other, and she sometimes has to get up and walk around until it resolves.  Has to rub the area and walk mostly; has not tried taking anything.  Tries to drink enough water.    Feels her mood has been stable recently - taking Celexa 20 mg daily and Ativan 0.5 mg nightly.  Feels this is working well for her.    Taking Lipitor 20 mg daily a couple times per week - does not take every day.    Has some Xyzal at home and just started using it 2-3 days ago at night.          Past  Medical History:   Diagnosis Date   ??? 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 Breast cancer ([place]) 1999    in remission   ??? Constipation 12/27/2018   ??? COPD (chronic obstructive pulmonary disease) ([place])    ??? DDD (degenerative disc disease)    ??? Large bowel obstruction ([place]) 02/2015    [facility]   ??? LBP (low back pain)    ??? Pharyngoesophageal dysphagia    ??? SS (spinal stenosis)       Past Surgical History:   Procedure Laterality Date   ??? APPENDECTOMY  1956   ??? BLADDER SUSPENSION  1990   ??? BREAST LUMPECTOMY Right 1999   ??? CHOLECYSTECTOMY  1990   ??? EXCISION/BIOPSY Left 11/01/2018    Dr Yehuda Savannah [name], for [facility]   ??? FEMORAL HERNIA REPAIR Left 02/20/2015    strangulated with small bowel resection - [facility], [place]   ??? [place] for  endometriosis; done in [place]   ??? LUMBAR SPINE SURGERY  01/03/2019    VERTEBRAL AUMENTATION  L1     ??? OTHER SURGICAL HISTORY  11/14/13, 06/23/11, 06/30/11, 07/12/11 12/29/11    L4/L5 IESI   ??? OTHER SURGICAL HISTORY  12/17/2013    L5/S1 IESI   ??? OTHER SURGICAL HISTORY Bilateral 05/30/2014    SIJ and piriformis   ??? OTHER SURGICAL HISTORY Bilateral 09/16/2014    L3, L4, L5 Diagnostic Medial Branch Block   ??? OTHER SURGICAL HISTORY Bilateral 09/26/2014    L5 TFE   ??? OTHER SURGICAL HISTORY  10/10/2014    bil L5 TFE   ??? OTHER SURGICAL HISTORY  10/31/2014    caudal   ??? SMALL INTESTINE SURGERY  02/20/2015    strangulated left femoral hernia   ??? SPINE SURGERY N/A 01/03/2019    VERTEBRAL AUMENTATION  L1  (C-ARM X2, performed by Radonna Ricker, MD at [facility] OR   ??? TONSILLECTOMY  1946   ??? UPPER GASTROINTESTINAL ENDOSCOPY N/A 04/24/2019    EGD BIOPSY performed by Durene Fruits, MD at [facility] OR     Family History   Problem Relation Age of Onset   ??? Other Mother         ALS - diagnosed at age 72   ??? Stroke Father         age 72   ??? Cancer Sister         pt thinks it was liver   ??? Cancer Brother         leukemia   ??? Diabetes  Maternal Grandfather    ??? Other Brother         drowned   ??? Other Brother         died at age 104; think due to spinal meningitis   ??? Lupus Sister    ??? Alzheimer's Disease Sister    ??? Dementia Sister    ??? Mult Sclerosis Nephew    ??? Mult Sclerosis Daughter      Social History     Tobacco Use   ??? Smoking status: Former Smoker     Packs/day: 1.00     Years: 40.00     Pack years: 40.00     Start date: 01/10/1953     Quit date: 01/11/1988     Years since quitting: 31.3   ??? Smokeless tobacco: Never Used   ??? Tobacco comment: quit 25 years ago 1990   Substance Use Topics   ??? Alcohol use: No     Alcohol/week: 0.0 standard drinks      Current Outpatient Medications   Medication Sig Dispense Refill   ??? budesonide-formoterol (SYMBICORT) 160-4.5 MCG/ACT AERO Inhale 2 puffs into the lungs 2 times daily Rinse mouth or brush teeth after each use. 3 Inhaler 3   ??? nitrofurantoin, macrocrystal-monohydrate, (MACROBID) 100 MG capsule Take 1 capsule by mouth 2 times daily for 10 days 20 capsule 0   ??? fluconazole (DIFLUCAN) 150 MG tablet Take 1 tab by mouth once, and then repeat dose again in 3 days. 2 tablet 0   ??? lisinopril (PRINIVIL;ZESTRIL) 20 MG tablet Take 1 tablet by mouth daily 90 tablet 1   ??? aspirin 81 MG EC tablet Take 81 mg by mouth daily Indications: takes irregularly      ??? LORazepam (ATIVAN) 0.5 MG tablet Take 1 tablet by mouth nightly as needed for Anxiety for up to 90 days. 90 tablet 0   ??? alendronate (  FOSAMAX) 70 MG tablet Take 1 tablet by mouth every 7 days 12 tablet 3   ??? CALCIUM CARBONATE-VITAMIN D PO Take 600 mg by mouth daily     ??? citalopram (CELEXA) 20 MG tablet Take 1 tablet by mouth daily 90 tablet 3   ??? atorvastatin (LIPITOR) 20 MG tablet Take 1 tablet by mouth daily 90 tablet 0   ??? Multiple Vitamins-Minerals (THERAPEUTIC MULTIVITAMIN-MINERALS) tablet Take 1 tablet by mouth daily     ??? albuterol sulfate HFA (VENTOLIN HFA) 108 (90 Base) MCG/ACT inhaler Ventolin HFA 90 mcg/actuation aerosol inhaler   Inhale 2 puffs  every 6 hours by inhalation route as needed.   ASTHMA     ??? docusate sodium (COLACE) 100 MG capsule Take 100 mg by mouth nightly      ??? polyvinyl alcohol-povidone (HYPOTEARS) 1.4-0.6 % ophthalmic solution Place 1-2 drops into both eyes as needed.       No current facility-administered medications for this visit.      Allergies   Allergen Reactions   ??? Morphine Other (See Comments)     Night sweats,felt "loopy".   ??? Pcn [Penicillins] Hives   ??? Zithromax [Azithromycin] Hives   ??? Adhesive Tape Rash       Health Maintenance   Topic Date Due   ??? Hepatitis C screen  Never done   ??? Annual Wellness Visit (AWV)  11/06/2019   ??? Lipid screen  12/03/2019   ??? Potassium monitoring  01/13/2020   ??? Creatinine monitoring  01/13/2020   ??? DTaP/Tdap/Td vaccine (2 - Td) 09/28/2027   ??? DEXA (modify frequency per FRAX score)  Completed   ??? Flu vaccine  Completed   ??? Shingles Vaccine  Completed   ??? Pneumococcal 65+ years Vaccine  Completed   ??? COVID-19 Vaccine  Completed   ??? Hepatitis A vaccine  Aged Out   ??? Hepatitis B vaccine  Aged Out   ??? Hib vaccine  Aged Out   ??? Meningococcal (ACWY) vaccine  Aged Out       Subjective:      Review of Systems   Genitourinary: Positive for difficulty urinating (started yesterday - has to bear down to void more than usual). Negative for dysuria and hematuria.       Objective:     Vitals:    04/29/19 1000 04/29/19 1003   BP: 80/62 84/62   Site: Right Upper Arm    Position: Sitting    Cuff Size: Medium Adult    Pulse: 62    Temp: 97.6 ??F (36.4 ??C)    SpO2: 95%    Weight: 133 lb 3.2 oz (60.4 kg)    Height: 5\' 4"  (1.626 m)      Physical Exam  Vitals signs and nursing note reviewed.   Constitutional:       General: She is not in acute distress.     Appearance: She is well-developed.   HENT:      Head: Normocephalic and atraumatic.      Right Ear: Tympanic membrane, ear canal and external ear normal.      Left Ear: Tympanic membrane, ear canal and external ear normal.      Nose: Nose normal.      Mouth/Throat:       Mouth: Mucous membranes are moist.      Pharynx: Oropharynx is clear. No posterior oropharyngeal erythema.   Eyes:      Conjunctiva/sclera: Conjunctivae normal.   Neck:  Musculoskeletal: Neck supple.   Cardiovascular:      Rate and Rhythm: Normal rate and regular rhythm.      Heart sounds: Normal heart sounds.   Pulmonary:      Effort: Pulmonary effort is normal. No respiratory distress.      Breath sounds: Normal breath sounds.   Abdominal:      General: Bowel sounds are normal. There is no distension.      Palpations: Abdomen is soft.      Tenderness: There is no abdominal tenderness.   Skin:     General: Skin is warm and dry.   Neurological:      Mental Status: She is alert and oriented to person, place, and time.         Assessment:       Diagnosis Orders   1. Essential hypertension     2. Anxiety     3. Tremor     4. Difficulty urinating  Urinalysis Reflex to Culture   5. Chronic obstructive pulmonary disease, unspecified COPD type (HCC)  budesonide-formoterol (SYMBICORT) 160-4.5 MCG/ACT AERO   6. Centrilobular emphysema (HCC)  budesonide-formoterol (SYMBICORT) 160-4.5 MCG/ACT AERO   7. Acute cystitis with hematuria  nitrofurantoin, macrocrystal-monohydrate, (MACROBID) 100 MG capsule   8. Yeast UTI  fluconazole (DIFLUCAN) 150 MG tablet         Plan:      Will start having her cut her Lisinopril tabs in half for daily dose of 10 mg daily.      Return in about 3 months (around 07/29/2019) for f/u anxiety, BP; call in 3-4 weeks with update on BP readings and tremor.    Orders Placed This Encounter   Procedures   ??? Urinalysis Reflex to Culture     Standing Status:   Future     Number of Occurrences:   1     Standing Expiration Date:   04/28/2020     Order Specific Question:   SPECIFY(EX-CATH,MIDSTREAM,CYSTO,ETC)?     Answer:   mid-stream     Orders Placed This Encounter   Medications   ??? budesonide-formoterol (SYMBICORT) 160-4.5 MCG/ACT AERO     Sig: Inhale 2 puffs into the lungs 2 times daily Rinse mouth or  brush teeth after each use.     Dispense:  3 Inhaler     Refill:  3   ??? nitrofurantoin, macrocrystal-monohydrate, (MACROBID) 100 MG capsule     Sig: Take 1 capsule by mouth 2 times daily for 10 days     Dispense:  20 capsule     Refill:  0   ??? fluconazole (DIFLUCAN) 150 MG tablet     Sig: Take 1 tab by mouth once, and then repeat dose again in 3 days.     Dispense:  2 tablet     Refill:  0       Patient given educational materials - see patient instructions.  Discussed use, benefit, and side effects of prescribed medications.  All patient questions answered.  Pt voiced understanding.  Reviewed health maintenance.            Electronically signed by [name], DO on 05/06/2019 at 12:03 AM

## 2019-05-01 LAB — CULTURE, URINE

## 2019-05-15 NOTE — Telephone Encounter (Signed)
Pt will need an appt if she wants to discuss medication change. Can she come in tomorrow morning?

## 2019-05-15 NOTE — Telephone Encounter (Signed)
Pt calling stating she was started on lisinopril and states all she did was cough, pt stopped the med yesterday, pt questions if there anything else she can take, pt uses loaded pharmacy, please advise at above number.

## 2019-05-16 NOTE — Telephone Encounter (Signed)
LM for pt to make an appt

## 2019-05-20 ENCOUNTER — Inpatient Hospital Stay: Admit: 2019-05-20 | Payer: MEDICARE | Primary: Family Medicine

## 2019-05-20 ENCOUNTER — Ambulatory Visit: Admit: 2019-05-20 | Discharge: 2019-05-20 | Payer: MEDICARE | Attending: Family Medicine | Primary: Family Medicine

## 2019-05-20 DIAGNOSIS — R5383 Other fatigue: Secondary | ICD-10-CM

## 2019-05-20 DIAGNOSIS — I10 Essential (primary) hypertension: Secondary | ICD-10-CM

## 2019-05-20 LAB — CBC WITH AUTO DIFFERENTIAL
Absolute Eos #: 0.15 10*3/uL (ref 0.00–0.44)
Absolute Immature Granulocyte: 0.18 10*3/uL (ref 0.00–0.30)
Absolute Lymph #: 2.05 10*3/uL (ref 1.10–3.70)
Absolute Mono #: 1.03 10*3/uL (ref 0.10–1.20)
Basophils Absolute: 0.11 10*3/uL (ref 0.00–0.20)
Basophils: 1 % (ref 0–2)
Eosinophils %: 1 % (ref 1–4)
Hematocrit: 42.8 % (ref 36.3–47.1)
Hemoglobin: 13.5 g/dL (ref 11.9–15.1)
Immature Granulocytes: 2 % — ABNORMAL HIGH
Lymphocytes: 19 % — ABNORMAL LOW (ref 24–43)
MCH: 30.8 pg (ref 25.2–33.5)
MCHC: 31.5 g/dL (ref 25.2–33.5)
MCV: 97.7 fL (ref 82.6–102.9)
MPV: 10.5 fL (ref 8.1–13.5)
Monocytes: 10 % (ref 3–12)
NRBC Automated: 0 per 100 WBC
Platelets: 217 10*3/uL (ref 138–453)
RBC: 4.38 m/uL (ref 3.95–5.11)
RDW: 14.6 % — ABNORMAL HIGH (ref 11.8–14.4)
Seg Neutrophils: 67 % — ABNORMAL HIGH (ref 36–65)
Segs Absolute: 7.05 10*3/uL (ref 1.50–8.10)
WBC: 10.6 10*3/uL (ref 3.5–11.3)

## 2019-05-20 LAB — URINALYSIS WITH REFLEX TO CULTURE
Bilirubin Urine: NEGATIVE
Glucose, Ur: NEGATIVE
Ketones, Urine: NEGATIVE
Leukocyte Esterase, Urine: NEGATIVE
Nitrite, Urine: NEGATIVE
Protein, UA: NEGATIVE
Specific Gravity, UA: 1.03 — ABNORMAL HIGH (ref 1.010–1.025)
Urine Hgb: NEGATIVE
Urobilinogen, Urine: NORMAL
pH, UA: 5 (ref 5.0–6.0)

## 2019-05-20 LAB — MICROSCOPIC URINALYSIS
Epithelial Cells UA: 0 /HPF (ref 0–5)
RBC, UA: 0 /HPF (ref 0–4)
WBC, UA: 0 /HPF (ref 0–4)

## 2019-05-20 LAB — TSH WITH REFLEX: TSH: 0.79 mIU/L (ref 0.30–5.00)

## 2019-05-20 NOTE — Progress Notes (Signed)
[place]  [address] [place] [address]  [phone]      Katie Juarez is a 79 y.o. female who presents today for her medical conditions/complaints as noted below.  Katie Juarez is c/o of Hypertension (Lisinopril worsened her cough that she's had for 2 yrs.  Last dose May 5)      HPI:     Pt here today for follow-up of HTN.    Pt started Lisinopril on 02/01/19 at 10 mg daily; was then increased to 20 mg daily.  When she was last seen on 4/19, her BP was only 84/62, so we had her start taking only 1/2 tab of the 20 mg dose again.  She states the diastolic readings were still low when she would check at home.     However, she had noticed that her cough that she already had was getting worse - mostly at night or when she would lay down, but could be "constant".  Was still bringing up phlegm intermittently with the cough.  She decided to try stopping the Lisinopril completely to see if that helped, so she stopped it 6 days ago.  Feels the cough is approx 50% improved already, mostly the dry cough episodes.    Taking daily MVI and Calcium + D - unsure of total dose of Vit D.            Past Medical History:   Diagnosis Date   ??? 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 Breast cancer ([place]) 1999    in remission   ??? Constipation 12/27/2018   ??? COPD (chronic obstructive pulmonary disease) ([place])    ??? DDD (degenerative disc disease)    ??? Large bowel obstruction ([place]) 02/2015    [facility]   ??? LBP (low back pain)    ??? Pharyngoesophageal dysphagia    ??? SS (spinal stenosis)       Past Surgical History:   Procedure Laterality Date   ??? APPENDECTOMY  1956   ??? BLADDER SUSPENSION  1990   ??? BREAST LUMPECTOMY Right 1999   ??? CHOLECYSTECTOMY  1990   ??? EXCISION/BIOPSY Left 11/01/2018    Dr Yehuda Savannah / [name], for [facility]   ??? FEMORAL HERNIA REPAIR Left 02/20/2015    strangulated with small bowel resection - [facility], [place]   ???  Day for endometriosis; done in [place]   ??? LUMBAR SPINE SURGERY  01/03/2019    VERTEBRAL AUMENTATION  L1     ??? OTHER SURGICAL HISTORY  11/14/13, 06/23/11, 06/30/11, 07/12/11 12/29/11    L4/L5 IESI   ??? OTHER SURGICAL HISTORY  12/17/2013    L5/S1 IESI   ??? OTHER SURGICAL HISTORY Bilateral 05/30/2014    SIJ and piriformis   ??? OTHER SURGICAL HISTORY Bilateral 09/16/2014    L3, L4, L5 Diagnostic Medial Branch Block   ??? OTHER SURGICAL HISTORY Bilateral 09/26/2014    L5 TFE   ??? OTHER SURGICAL HISTORY  10/10/2014    bil L5 TFE   ??? OTHER SURGICAL HISTORY  10/31/2014    caudal   ??? SMALL INTESTINE SURGERY  02/20/2015    strangulated left femoral hernia   ??? SPINE SURGERY N/A 01/03/2019    VERTEBRAL AUMENTATION  L1  (C-ARM X2, performed by Loura Back, [place] at [place]   ??? TONSILLECTOMY  1946   ??? UPPER GASTROINTESTINAL ENDOSCOPY N/A 04/24/2019    EGD BIOPSY performed by Monna Fam, MD at [facility] OR     Family History   Problem Relation Age of Onset   ??? Other Mother         ALS - diagnosed at age 24   ??? Stroke Father         age 88   ??? Cancer Sister         pt thinks it was liver   ??? Cancer Brother         leukemia   ??? Diabetes Maternal Grandfather    ??? Other Brother         drowned   ??? Other Brother         died at age 35; think due to spinal meningitis   ??? Lupus Sister    ??? Alzheimer's Disease Sister    ??? Dementia Sister    ??? Mult Sclerosis Nephew    ??? Mult Sclerosis Daughter      Social History     Tobacco Use   ??? Smoking status: Former Smoker     Packs/day: 1.00     Years: 40.00     Pack years: 40.00     Start date: 01/10/1953     Quit date: 01/11/1988     Years since quitting: 31.3   ??? Smokeless tobacco: Never Used   ??? Tobacco comment: quit 25 years ago 1990   Substance Use Topics   ??? Alcohol use: No     Alcohol/week: 0.0 standard drinks      Current Outpatient Medications   Medication Sig Dispense Refill   ??? budesonide-formoterol (SYMBICORT) 160-4.5 MCG/ACT AERO Inhale 2 puffs into the lungs 2 times daily  Rinse mouth or brush teeth after each use. 3 Inhaler 3   ??? aspirin 81 MG EC tablet Take 81 mg by mouth daily Indications: takes irregularly      ??? LORazepam (ATIVAN) 0.5 MG tablet Take 1 tablet by mouth nightly as needed for Anxiety for up to 90 days. 90 tablet 0   ??? alendronate (FOSAMAX) 70 MG tablet Take 1 tablet by mouth every 7 days 12 tablet 3   ??? CALCIUM CARBONATE-VITAMIN D PO Take 600 mg by mouth 2 times daily Contains 600 mg Calcium & 800 IU Vitamin D3.     ??? citalopram (CELEXA) 20 MG tablet Take 1 tablet by mouth daily 90 tablet 3   ??? atorvastatin (LIPITOR) 20 MG tablet Take 1 tablet by mouth daily 90 tablet 0   ??? Multiple Vitamins-Minerals (THERAPEUTIC MULTIVITAMIN-MINERALS) tablet Take 1 tablet by mouth daily     ??? albuterol sulfate HFA (VENTOLIN HFA) 108 (90 Base) MCG/ACT inhaler Ventolin HFA 90 mcg/actuation aerosol inhaler   Inhale 2 puffs every 6 hours by inhalation route as needed.   ASTHMA     ??? docusate sodium (COLACE) 100 MG capsule Take 100 mg by mouth nightly      ??? polyvinyl alcohol-povidone (HYPOTEARS) 1.4-0.6 % ophthalmic solution Place 1-2 drops into both eyes as needed.     ??? lisinopril (PRINIVIL;ZESTRIL) 20 MG tablet Take 1 tablet by mouth daily (Patient not taking: Reported on 05/20/2019) 90 tablet 1     No current facility-administered medications for this visit.     Allergies   Allergen Reactions   ??? Morphine Other (See Comments)     Night sweats,felt "loopy".   ??? Pcn [Penicillins] Hives   ??? Zithromax [  Azithromycin] Hives   ??? Adhesive Tape Rash       Health Maintenance   Topic Date Due   ??? Hepatitis C screen  Never done   ??? Annual Wellness Visit (AWV)  11/06/2019   ??? Lipid screen  12/03/2019   ??? Potassium monitoring  01/13/2020   ??? Creatinine monitoring  01/13/2020   ??? DTaP/Tdap/Td vaccine (2 - Td) 09/28/2027   ??? DEXA (modify frequency per FRAX score)  Completed   ??? Flu vaccine  Completed   ??? Shingles Vaccine  Completed   ??? Pneumococcal 65+ years Vaccine  Completed   ??? COVID-19 Vaccine   Completed   ??? Hepatitis A vaccine  Aged Out   ??? Hepatitis B vaccine  Aged Out   ??? Hib vaccine  Aged Out   ??? Meningococcal (ACWY) vaccine  Aged Out       Subjective:      Review of Systems   Constitutional: Positive for fatigue.   Gastrointestinal: Negative for constipation and diarrhea.   Genitourinary: Negative for dysuria, frequency and hematuria.       Objective:     Vitals:    05/20/19 1410   BP: 120/84   Site: Right Upper Arm   Position: Sitting   Cuff Size: Medium Adult   Pulse: 64   SpO2: 94%   Weight: 133 lb (60.3 kg)   Height: 5\' 4"  (1.626 m)     Physical Exam  Vitals and nursing note reviewed.   Constitutional:       General: She is not in acute distress.     Appearance: She is well-developed.   HENT:      Head: Normocephalic and atraumatic.      Right Ear: Tympanic membrane, ear canal and external ear normal.      Left Ear: Tympanic membrane, ear canal and external ear normal.      Nose: Nose normal.      Mouth/Throat:      Mouth: Mucous membranes are moist.      Pharynx: Oropharynx is clear. No posterior oropharyngeal erythema.   Eyes:      Conjunctiva/sclera: Conjunctivae normal.   Cardiovascular:      Rate and Rhythm: Normal rate and regular rhythm.      Heart sounds: Normal heart sounds.   Pulmonary:      Effort: Pulmonary effort is normal. No respiratory distress.      Breath sounds: Normal breath sounds.   Abdominal:      General: Bowel sounds are normal. There is no distension.      Palpations: Abdomen is soft.      Tenderness: There is no abdominal tenderness.   Musculoskeletal:      Cervical back: Neck supple.   Skin:     General: Skin is warm and dry.   Neurological:      Mental Status: She is alert and oriented to person, place, and time.         Assessment:       Diagnosis Orders   1. Essential hypertension     2. Productive cough  FL MODIFIED BARIUM SWALLOW W VIDEO   3. Regurgitation of food  FL MODIFIED BARIUM SWALLOW W VIDEO   4. Vitamin D deficiency  Vitamin D 25 Hydroxy   5. Fatigue,  unspecified type  TSH With Reflex Ft4    CBC Auto Differential   6. Recurrent UTI  Urinalysis Reflex to Culture    Culture, Urine  Plan:      Return if symptoms worsen or fail to improve.    Orders Placed This Encounter   Procedures   ??? Culture, Urine     Standing Status:   Future     Number of Occurrences:   1     Standing Expiration Date:   05/19/2020     Order Specific Question:   Specify (ex-cath, midstream, cysto, etc)?     Answer:   mid-stream   ??? FL MODIFIED BARIUM SWALLOW W VIDEO     Standing Status:   Future     Standing Expiration Date:   05/19/2020     Order Specific Question:   Reason for exam:     Answer:   frequent cough with regurgitation; feels triggered by almost any food or lying down   ??? Vitamin D 25 Hydroxy     Standing Status:   Future     Number of Occurrences:   1     Standing Expiration Date:   05/19/2020   ??? TSH With Reflex Ft4     Standing Status:   Future     Number of Occurrences:   1     Standing Expiration Date:   05/19/2020   ??? CBC Auto Differential     Standing Status:   Future     Number of Occurrences:   1     Standing Expiration Date:   05/19/2020   ??? Urinalysis Reflex to Culture     Standing Status:   Future     Number of Occurrences:   1     Standing Expiration Date:   05/19/2020     Order Specific Question:   SPECIFY(EX-CATH,MIDSTREAM,CYSTO,ETC)?     Answer:   mid-stream     No orders of the defined types were placed in this encounter.      Patient given educational materials - see patient instructions.  Discussed use, benefit, and side effects of prescribed medications.  All patient questions answered.  Pt voiced understanding.  Reviewed health maintenance.            Electronically signed by [name], DO, DO on 05/26/2019 at 11:51 PM

## 2019-05-21 LAB — VITAMIN D 25 HYDROXY: Vit D, 25-Hydroxy: 32.7 ng/mL (ref 30.0–100.0)

## 2019-05-22 LAB — CULTURE, URINE: Culture: NO GROWTH

## 2019-05-30 ENCOUNTER — Inpatient Hospital Stay: Admit: 2019-05-30 | Payer: MEDICARE | Attending: Speech-Language Pathologist | Primary: Family Medicine

## 2019-05-30 ENCOUNTER — Inpatient Hospital Stay: Admit: 2019-05-30 | Payer: MEDICARE | Primary: Family Medicine

## 2019-05-30 DIAGNOSIS — R05 Cough: Secondary | ICD-10-CM

## 2019-05-30 DIAGNOSIS — R1319 Other dysphagia: Secondary | ICD-10-CM

## 2019-05-30 MED ORDER — BARIUM SULFATE 98 % PO SUSR
98 % | Freq: Once | ORAL | Status: AC | PRN
Start: 2019-05-30 — End: 2019-05-30
  Administered 2019-05-30: 18:00:00 140 mL via ORAL

## 2019-05-30 NOTE — Progress Notes (Signed)
SPEECH THERAPY  MODIFIED BARIUM SWALLOW STUDY    [x]  Outpatient [facility]  []  Inpatient- [facility]    Patient: Katie Juarez  Date of Birth: [birth year]  Gender: female  MRN:  [id number]  Referring Physician:   [name], [place] [address]  Diagnosis:    Esophageal dysphagia    Date of Study:  05/30/19    History of Present Illness/Injury: Patient is a 79 y.o. female who presents with c/o persistent cough for 2+ years which is worsening.  She notes the cough is not always associated with p.o. intake, nor does it worsen with p.o. intake.  It does worsen when she lies down.  She often will bring up white, foamy phlegm with the cough.  She underwent endoscopy on 04/24/19 by Dr. Hart Robinsons with negative findings.      Past Medical History:   Diagnosis Date   ??? 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 Breast cancer ([place]) 1999    in remission   ??? Constipation 12/27/2018   ??? COPD (chronic obstructive pulmonary disease) ([place])    ??? DDD (degenerative disc disease)    ??? Large bowel obstruction ([place]) 02/2015    [facility]   ??? LBP (low back pain)    ??? Pharyngoesophageal dysphagia    ??? SS (spinal stenosis)          Reported Dysphagia Symptoms:  [x] Coughing [] Food catching in throat  [] Drooling  [] Reflux               [] Wet Vocal Quality  [] Other:     Reason for referral:   [x] Assess physiology of swallow   [x] Determine appropriate diet, posture, or compensatory strategies  [x] Rule out aspiration    [] Other:      Prior MBSS:  [x] No   [] Yes    Date:      Results:      Pain  [x] No     [] Yes      Location:  N/A  Pain Rating (0-10 pain scale): 0  Pain Description:  N/A      Current Diet: []  NPO [x]  Regular diet []  Soft and bite sized (Dysphagia III)  []  Minced and moist (Dysphagia II)   []  Pureed (Dysphagia I)  []  Liquidised       Current Liquids: [x]  Thin []  Mildly Thick (Nectar thick)  []  Moderately Thick (Honey thick) []   Extremely Thick (Spoon-Pudding)    Respiratory Status: [x]  Independent []  Nasal Cannula []  Oxygen Mask        []  Tracheostomy []  Other:       []  Ventilator/Settings:    Behavioral Observation: [x]  Alert   [x]  Oriented   []  Confused  []  Lethargic      []  Dysarthric  []  Limited Direction Following  []  Agitated  []  Other:    Patient was evaluated using:   [x]  Thin liquid   []  Mildly thick (Nectar thick) liquid  []  Moderately thick (Honey thick) liquid/Liquidised   []  Extremely thick (Pudding thick) liquid      [x]  Solid [x]  Soft and bite sized  []  Minced and moist  [x]  Puree  []  barium tablet    Dentition   [] natural, condition:    [] good  [x] fair  [] poor   [] edentulous   [] partials:   [] upper [] lower [] loose [] not worn for MBSS   [] dentures:   []   upper [] lower [] loose [] not worn for MBSS        ORAL PREPARATION PHASE: [x]  WFL  []  Impaired/Reduced   []  Slow mastication     []  Uncoordinated mastication    []  Decreased bolus control   []  Decreased bolus formation   []  Loss of bolus from lips / Anterior spillage   []  OTHER:       ORAL PHASE: [x]  WFL              []  Impaired/Reduced   []  Residue in the anterior sulcus                   []  Residue in the lateral sulcus - right   []  Residue in the lateral sulcus - left               []  Uncoordinated AP movement   []  Slow AP movement                             []  Decreased lingual elevation   []  Decreased tongue base retraction               []  Uncontrolled bolus/diffuse fall over tongue base   []  Piecemeal deglutition     []  Base of tongue residue   []  OTHER:     ORAL PHASE DOSS SCORE: (Dysphagia outcome and severity scale)  [x]  7 = Normal in all situations  []  6 = WFL / Mod I; Normal diet; May have mild oral delay  []  5 = Mild dysphagia; May have one diet consistency restricted; Mild oral residue -clears.  []  4 = Mild-Moderate dysphagia; May have one or two diet consistencies restricted; Oral residue clears with cue;  Intermittent supervision or cueing.  []  3 = Moderate  dysphagia; Total assist with strategies; Two or more consistencies restricted; Moderate oral residue clears with cue;   []  2 = Moderately Severe dysphagia; Tolerates at least one consistency safely with total use of strategies; Severe oral residue and unable to clear or needs multiple cues;  Non-oral nutrition.  []  1 = Severe dysphagia; NPO; Unable to tolerate any po safely; Severe oral loss of bolus or oral residue; Non-oral nutrition.        PHARYNGEAL PHASE: [x]  WFL  []  Impaired   []  Absent Swallow                []  Delayed Swallow               []  Decreased airway protection   []  Decreased epiglottic inversion     []  Decreased hyolaryngeal elevation   []  Residue in the valleculae               []  Residue in the pyriform sinus    []  Cricopharyngeal dysfunction              []  Residue along posterior pharyngeal wall     []  Residue along the ariepiglottic folds    []  Decreased pharyngeal contraction   []  OTHER:    PHARYNGEAL PHASE DOSS SCORE: (Dysphagia outcome and severity scale)  [x]  7 = Normal in all situations; Normal diet; No strategies  []  6 = WFL / Mod I; May have mild pharyngeal delay or residue but clears spontaneously; No aspiration or laryngeal penetration  []  5 = Mild dysphagia:  May need one consistency restricted; May have one or more of the following:  Aspiration with thin -  cough to clear;  Airway penetration midway to the vocal cords with one or more consistency or to the vocal folds with one consistency, but clears spontaneously; Residue in the pharynx clears spontaneously.  []  4 = Mild - Moderate dysphagia: One or two consistencies restricted; May exhibit one or more of the following:  Residue clears with cue; Aspiration of one consistency with weak or no reflexive cough; Laryngeal penetration to the vocal cords with cough with two consistencies; Laryngeal penetration to the vocal cords without cough on one consistency.  []  3 = Moderate dysphagia:  Two or more diet consistencies restricted; May  exhibit one or more of the following:  Moderate residue clears with cue; Airway penetration to the level of the vocal folds without cough with two or more consistencies; Aspiration with two consistencies with weak or no reflexive cough; Aspiration of one consistency, no cough and airway penetration with one consistency, no cough.  []  2 = Moderately Severe dysphagia:  Tolerates at least one consistency safely with total use of strategies; Non-oral nutrition; Severe residue unable to clear; Aspiration with two or more consistencies with no cough; Aspiration with one or more consistency, no cough and airway penetration to the vocal folds with one or more consistency, no cough.  []  1 = Severe dysphagia:  NPO; Unable to tolerate any po safely; Severe residue unable to clear; Silent aspiration with two or more consistencies, non-functional cough;  OR Unable to achieve a swallow.        SIGNS AND SYMPTOMS OF LARYNGEAL PENETRATION / ASPIRATION:  [x]  No evidence of laryngeal penetration  [x]  No evidence of aspiration  []  Laryngeal penetration evident with:  []  Audible aspiration evident with:  []  Silent aspiration evident with:    PENETRATION-ASPIRATION SCALE (PAS):  [x]  1 = Material does not enter the airway  []  2 = Material enters the airway, remains above the vocal folds, and is ejected from the airway  []  3 = Material enters the airway, remains above the vocal folds, and is not ejected from airway  []  4 = Material enters the airway, contacts the vocal folds, and is ejected from the airway  []  5 = Material enters the airway, contacts the vocal folds, and is not ejected from the airway  []  6 = Material enters the airway, passes below the vocal folds, and is ejected into the larynx or out of the airway  []  7 = Material enters the airway, passes below the vocal folds, and is not ejected from the trachea despite effort  []  8 = Material enters the airway, passes below the vocal folds, and no effort is made to  eject    ESOPHAGEAL PHASE:   []  No significant findings  []  See Radiology Report for details  [x]  Recommend further esophageal testing via esophagram to further assess esophageal functioning with presence of bolus.  Bolus observed to catch/slow to clear esophagus just inferior to UES.      ATTEMPTED TECHNIQUES:                                 Comments:  [] Small bolus size []  Effective [] Not Effective    [] Straw []  Effective [] Not Effective    [] Cup []  Effective [] Not Effective    [] Chin tuck []  Effective [] Not Effective    [] Head Turn []  Effective [] Not Effective    [] Spoon presentations []  Effective [] Not Effective    []  Volitional cough []   Effective [] Not Effective    [] Spontaneous cough []  Effective [] Not Effective    [] OTHER: []  Effective [] Not Effective      DIAGNOSTIC IMPRESSIONS: Patient presents with oral preparatory phase, oral phase, and pharyngeal phase of swallow WFL.  No penetration.  No aspiration.  No pharyngeal stasis.  Patient at minimal risk of aspiration and pulmonary compromise.      Further esophageal testing via esophagram to further assess esophageal functioning with presence of bolus is warranted as bolus observed to catch/slow to clear esophagus just inferior to UES.      DIET RECOMMENDATIONS:    Diet:  []  NPO [x]  Regular diet   []  Soft and bite sized (Dysphagia III)  []  Minced and moist (Dysphagia II)   []  Pureed (Dysphagia I)  []  Liquidised       Liquids: [x]  Thin []  Mildly Thick (Nectar thick)  []  Moderately Thick (Honey thick) []  Extremely Thick (Spoon-Pudding)        STRATEGIES:   []  Full upright position  []  Small bite/sip   []  No Straw   []  Multiple Swallow  []  Chin tuck   []  Head turn   []  Pulmonary monitoring []  Oral care after all meals  []  Supervision   []  Medication in applesauce   []  Direct 1:1 Supervision []  Spoon all liquids   []  Alternate solid / liquid []  Limit distractions   []  Monitor for fatigue  []  PMV in place for all po   []  OTHER:    PATIENT STRENGTHS:  [x]   Motivated [x]  Cooperative   []  Good family support    []  Prior level of function  []  OTHER:    REHAB POTENTIAL:   []  Excellent []  Good []  Fair  []  Poor    EDUCATION:   Learner: [x]  Patient            []  Significant other                                   []  Son/Daughter []  Parent []  Other:     Education: [x]  Reviewed results and recommendations of this evaluation     []  Reviewed diet and strategies     []  Reviewed signs, symptoms and risk of aspiration     []  Demonstrated how to thick liquid appropriately.     []  Reviewed goals and Plan of Care     []  OTHER:     Method: [x]  Discussion  []  Demonstration []  Hand-out     []  Other:     Evaluation of Education:     [x]  Verbalizes understanding      []  Demonstrates with assistance     []  Demonstrates without assistance                                  [] Needs further instruction      []  No evidence of learning                                    []  Family not present    PATIENT GOALS: []  Pt did not state; will further assess during treatment.     []  Return to the least restricted diet possible     [x]  Return to previous level of function     []   Other:    PLAN / TREATMENT RECOMMENDATIONS:  [x]  No further speech therapy services indicated.  []  Speech Therapy evaluation to assess speech, language, cognition and/or voice  []  Skilled dysphagia treatment ___ times per week for ___ weeks.  [x]  OTHER: esophagram to further assess esophageal functioning with presence of bolus      Electronically signed by:     Elsie Amis, MS, CCC-SLP         Date: 05/30/2019

## 2019-06-13 ENCOUNTER — Ambulatory Visit: Admit: 2019-06-13 | Discharge: 2019-06-13 | Payer: MEDICARE | Attending: Family Medicine | Primary: Family Medicine

## 2019-06-13 DIAGNOSIS — K5792 Diverticulitis of intestine, part unspecified, without perforation or abscess without bleeding: Secondary | ICD-10-CM

## 2019-06-13 MED ORDER — CIPROFLOXACIN HCL 500 MG PO TABS
500 MG | ORAL_TABLET | Freq: Two times a day (BID) | ORAL | 0 refills | Status: DC
Start: 2019-06-13 — End: 2019-07-30

## 2019-06-13 MED ORDER — METRONIDAZOLE 500 MG PO TABS
500 MG | ORAL_TABLET | Freq: Three times a day (TID) | ORAL | 0 refills | Status: AC
Start: 2019-06-13 — End: 2019-06-23

## 2019-06-13 NOTE — Patient Instructions (Signed)
Patient Education        Diverticulitis: Care Instructions  Overview     Diverticulitis occurs when pouches form in the wall of the colon and become inflamed or infected. It can be very painful.  Doctors aren't sure what causes diverticulitis. There is no proof that foods such as nuts, seeds, or berries cause it or make it worse. A low-fiber diet may cause the colon to work harder to push stool forward. Pouches may form because of this extra work.  It may be hard to think about healthy eating while you're in pain. But as you recover, you might think about how you can use healthy eating for overall better health. Healthy eating may help you avoid future attacks.  Follow-up care is a key part of your treatment and safety. Be sure to make and go to all appointments, and call your doctor if you are having problems. It's also a good idea to know your test results and keep a list of the medicines you take.  How can you care for yourself at home?  ?? Drink plenty of fluids. If you have kidney, heart, or liver disease and have to limit fluids, talk with your doctor before you increase the amount of fluids you drink.  ?? Stay with liquids or a bland diet (plain rice, bananas, dry toast or crackers, applesauce) until you are feeling better. Then you can return to regular foods and slowly increase the amount of fiber in your diet.  ?? Use a heating pad set on low on your belly to relieve mild cramps and pain.  ?? Get extra rest until you are feeling better.  ?? Be safe with medicines. Read and follow all instructions on the label.  ? If the doctor gave you a prescription medicine for pain, take it as prescribed.  ? If you are not taking a prescription pain medicine, ask your doctor if you can take an over-the-counter medicine.  ?? If your doctor prescribed antibiotics, take them as directed. Do not stop taking them just because you feel better. You need to take the full course of antibiotics.  ?? Do not use laxatives or enemas  unless your doctor tells you to use them.  When should you call for help?   Call your doctor now or seek immediate medical care if:  ?? ?? You have a fever.   ?? ?? You are vomiting.   ?? ?? You have new or worse belly pain.   ?? ?? You cannot pass stools or gas.   Watch closely for changes in your health, and be sure to contact your doctor if you have any problems.  Where can you learn more?  Go to [email or web address] and sign in to your MyChart account. Enter H901 in the [place] box to learn more about "Diverticulitis: Care Instructions."     If you do not have an account, please click on the "Sign Up Now" link.  Current as of: April 25, 2018??????????????????????????????Content Version: 12.8  ?? 2006-2021 Healthwise[employer].   Care instructions adapted under license by [facility]. If you have questions about a medical condition or this instruction, always ask your healthcare professional. [place] any warranty or liability for your use of this information.

## 2019-06-13 NOTE — Progress Notes (Signed)
[place]  [place] [id number]  Dept: [phone]  Dept Fax: [phone]  Loc: [phone]    Katie Juarez is a 79 y.o. female who presents today for her medical conditions/complaints as noted below.  Katie Juarez is c/o of   Chief Complaint   Patient presents with   ??? Diverticulitis       HPI:     Here today for abdominal pain.    Abdominal Pain  This is a new problem. The current episode started in the past 7 days (3 days). The onset quality is sudden (ate some gronola bars). The problem occurs constantly. The problem has been unchanged. The pain is located in the LLQ and RLQ. The pain is at a severity of 5/10. The pain is moderate. The quality of the pain is dull. The abdominal pain does not radiate. Associated symptoms include diarrhea (waking her from sleep), flatus and weight loss. Pertinent negatives include no belching, constipation, fever, headaches, hematochezia, nausea or vomiting. Nothing aggravates the pain. The pain is relieved by bowel movements and passing flatus. Treatments tried: miralex and stool softeners, started a soft diet. The treatment provided no relief. There is no history of abdominal surgery, colon cancer, Crohn's disease or ulcerative colitis.         Past Medical History:   Diagnosis Date   ??? 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 Breast cancer ([place]) 1999    in remission   ??? Constipation 12/27/2018   ??? COPD (chronic obstructive pulmonary disease) ([place])    ??? DDD (degenerative disc disease)    ??? Large bowel obstruction ([place]) 02/2015    [facility]   ??? LBP (low back pain)    ??? Pharyngoesophageal dysphagia    ??? SS (spinal stenosis)           Social History     Tobacco Use   ??? Smoking status: Former Smoker     Packs/day: 1.00     Years: 40.00     Pack years: 40.00     Start date: 01/10/1953     Quit date: 01/11/1988     Years since quitting: 31.4    ??? Smokeless tobacco: Never Used   ??? Tobacco comment: quit 25 years ago 1990   Substance Use Topics   ??? Alcohol use: No     Alcohol/week: 0.0 standard drinks     Current Outpatient Medications   Medication Sig Dispense Refill   ??? ciprofloxacin (CIPRO) 500 MG tablet Take 1 tablet by mouth 2 times daily 20 tablet 0   ??? metroNIDAZOLE (FLAGYL) 500 MG tablet Take 1 tablet by mouth 3 times daily for 10 days 30 tablet 0   ??? budesonide-formoterol (SYMBICORT) 160-4.5 MCG/ACT AERO Inhale 2 puffs into the lungs 2 times daily Rinse mouth or brush teeth after each use. 3 Inhaler 3   ??? lisinopril (PRINIVIL;ZESTRIL) 20 MG tablet Take 1 tablet by mouth daily 90 tablet 1   ??? aspirin 81 MG EC tablet Take 81 mg by mouth daily Indications: takes irregularly      ??? LORazepam (ATIVAN) 0.5 MG tablet Take 1 tablet by mouth nightly as needed for Anxiety for up to 90 days. 90 tablet 0   ??? alendronate (FOSAMAX) 70 MG tablet Take 1 tablet by mouth every 7 days 12 tablet  3   ??? CALCIUM CARBONATE-VITAMIN D PO Take 600 mg by mouth 2 times daily Contains 600 mg Calcium & 800 IU Vitamin D3.     ??? citalopram (CELEXA) 20 MG tablet Take 1 tablet by mouth daily 90 tablet 3   ??? atorvastatin (LIPITOR) 20 MG tablet Take 1 tablet by mouth daily 90 tablet 0   ??? Multiple Vitamins-Minerals (THERAPEUTIC MULTIVITAMIN-MINERALS) tablet Take 1 tablet by mouth daily     ??? albuterol sulfate HFA (VENTOLIN HFA) 108 (90 Base) MCG/ACT inhaler Ventolin HFA 90 mcg/actuation aerosol inhaler   Inhale 2 puffs every 6 hours by inhalation route as needed.   ASTHMA     ??? docusate sodium (COLACE) 100 MG capsule Take 100 mg by mouth nightly      ??? polyvinyl alcohol-povidone (HYPOTEARS) 1.4-0.6 % ophthalmic solution Place 1-2 drops into both eyes as needed.       No current facility-administered medications for this visit.          Allergies   Allergen Reactions   ??? Morphine Other (See Comments)     Night sweats,felt "loopy".   ??? Pcn [Penicillins] Hives   ??? Zithromax  [Azithromycin] Hives   ??? Adhesive Tape Rash       Subjective:     Review of Systems   Constitutional: Positive for weight loss. Negative for activity change, appetite change, chills, fatigue and fever.   Eyes: Negative for visual disturbance.   Respiratory: Negative for cough, chest tightness, shortness of breath and wheezing.    Cardiovascular: Negative for chest pain, palpitations and leg swelling.   Gastrointestinal: Positive for abdominal pain, diarrhea (waking her from sleep) and flatus. Negative for constipation, hematochezia, nausea and vomiting.   Genitourinary: Negative for difficulty urinating.   Neurological: Negative for dizziness, syncope, weakness, light-headedness and headaches.       Objective:      Physical Exam  Vitals and nursing note reviewed.   Constitutional:       General: She is not in acute distress.     Appearance: She is well-developed.   Eyes:      Conjunctiva/sclera: Conjunctivae normal.   Neck:      Thyroid: No thyromegaly.   Cardiovascular:      Rate and Rhythm: Normal rate and regular rhythm.      Heart sounds: Normal heart sounds. No murmur heard.     Pulmonary:      Effort: Pulmonary effort is normal. No respiratory distress.      Breath sounds: Normal breath sounds. No wheezing.   Abdominal:      General: Abdomen is flat. Bowel sounds are normal.      Palpations: Abdomen is soft.      Tenderness: There is abdominal tenderness in the left lower quadrant. There is no guarding or rebound.   Musculoskeletal:      Cervical back: Normal range of motion and neck supple.   Lymphadenopathy:      Cervical: No cervical adenopathy.   Skin:     General: Skin is warm and dry.      Findings: No erythema or rash.   Neurological:      Mental Status: She is alert and oriented to person, place, and time.       BP 128/72    Pulse 73    Temp 98.7 ??F (37.1 ??C)    Ht 5\' 4"  (1.626 m)    Wt 132 lb (59.9 kg)    LMP  (LMP Unknown)  SpO2 94%    BMI 22.66 kg/m??     Wt Readings from Last 3 Encounters:    06/13/19 132 lb (59.9 kg)   05/20/19 133 lb (60.3 kg)   04/29/19 133 lb 3.2 oz (60.4 kg)         Assessment:       Diagnosis Orders   1. Diverticulitis               Plan:        Diverticulitis: new; I will treat with cipro and flagyl. She was advised to return if any symptoms worsen.    Return if symptoms worsen or fail to improve.      Orders Placed This Encounter   Medications   ??? ciprofloxacin (CIPRO) 500 MG tablet     Sig: Take 1 tablet by mouth 2 times daily     Dispense:  20 tablet     Refill:  0   ??? metroNIDAZOLE (FLAGYL) 500 MG tablet     Sig: Take 1 tablet by mouth 3 times daily for 10 days     Dispense:  30 tablet     Refill:  0       Patientgiven educational materials - see patient instructions.  Discussed use, benefit,and side effects of prescribed medications.  All patient questions answered. Ptvoiced understanding. Reviewed health maintenance.  Instructed to continue currentmedications, diet and exercise.  Patient agreed with treatment plan. Follow up asdirected.     Electronically signed by Nell Range, MD on 06/13/2019 at 1:34 PM

## 2019-06-17 ENCOUNTER — Encounter

## 2019-06-18 MED ORDER — LORAZEPAM 0.5 MG PO TABS
0.5 MG | ORAL_TABLET | Freq: Every evening | ORAL | 0 refills | Status: DC | PRN
Start: 2019-06-18 — End: 2019-09-12

## 2019-07-30 ENCOUNTER — Ambulatory Visit: Admit: 2019-07-30 | Discharge: 2019-07-30 | Payer: MEDICARE | Attending: Family Medicine | Primary: Family Medicine

## 2019-07-30 DIAGNOSIS — M545 Low back pain: Secondary | ICD-10-CM

## 2019-07-30 MED ORDER — HANDICAP PLACARD
0 refills | Status: AC
Start: 2019-07-30 — End: ?

## 2019-07-30 NOTE — Progress Notes (Signed)
[place]  [address] [place] [address]  [phone]      Katie Juarez is a 79 y.o. female who presents today for her medical conditions/complaints as noted below.  Katie Juarez is c/o of 3 Month Follow-Up (htn)      HPI:     Pt here today for follow-up of HTN.    Stopped her Lisinopril after her last OV on 05/20/19 - doing well.  Was checking her BP at home until approx 2 weeks ago - was usually 120-130's/60-70's.  Denies any physical sx's off the Lisinopril.  BP well-controlled today - 128/62.    Having some pains down her lower legs at night, usually worse in the R leg.  Feels like crampy pains; can radiate to her feet.  Doesn't happen every night; started approx 6-8 weeks ago.  Does think that it is related to how much she walks during the day, but otherwise, also trying to increase her potassium.    Still has some reflux/coughing when she drinks a cold liquid; will cough to the point of bringing up some watery/saliva-like liquid.  However, she notes this is not happening as much, or having as much cough.  Did have this last night after drinking cold water; happens maybe twice per week.    Still having some shakiness in her hands - notices this most when she is painting tiny details with her [place] painting or with writing.  Denies trouble with gait or memory.  Declines Neurology referral at this time.    Had to come to UC on 6/3 for diverticulitis - does feel that her sx's are completely resolved.  Had a lot of side effects to the antibiotic regimen (Cipro/Flagyl) - decreased appetite, fatigue; wonders if she was on too much Cipro (states she thought she was to take it 500 mg TID, but it was prescribed BID - she thinks it was written differently on her label).      Was seen to get her hearing checked at [facility], and she was told her R ear was full of wax - would like to get this cleaned out.  Will have another hearing test in the next month.  Hearing  aides working well for her.    [place] out of bed at her daughter's house - got up in the middle of the night, and was too close to the edge, and she fell, hitting her L upper arm on the nightstand.  Did not hit her head.  Has a large bruise on her L upper arm.        Past Medical History:   Diagnosis Date   ??? 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 Breast cancer ([place]) 1999    in remission   ??? Constipation 12/27/2018   ??? COPD (chronic obstructive pulmonary disease) ([place])    ??? DDD (degenerative disc disease)    ??? Large bowel obstruction ([place]) 02/2015    [facility]   ??? LBP (low back pain)    ??? Pharyngoesophageal dysphagia    ??? SS (spinal stenosis)       Past Surgical History:   Procedure Laterality Date   ??? APPENDECTOMY  1956   ??? BLADDER SUSPENSION  1990   ??? BREAST LUMPECTOMY Right 1999   ??? CHOLECYSTECTOMY  1990   ??? EXCISION/BIOPSY Left 11/01/2018    Dr Yehuda Savannah / [name], for  [place]   ??? FEMORAL HERNIA REPAIR Left 02/20/2015    strangulated with small bowel resection - [facility], [place]   ??? [place] for endometriosis; done in [place]   ??? LUMBAR SPINE SURGERY  01/03/2019    VERTEBRAL AUMENTATION  L1     ??? OTHER SURGICAL HISTORY  11/14/13, 06/23/11, 06/30/11, 07/12/11 12/29/11    L4/L5 IESI   ??? OTHER SURGICAL HISTORY  12/17/2013    L5/S1 IESI   ??? OTHER SURGICAL HISTORY Bilateral 05/30/2014    SIJ and piriformis   ??? OTHER SURGICAL HISTORY Bilateral 09/16/2014    L3, L4, L5 Diagnostic Medial Branch Block   ??? OTHER SURGICAL HISTORY Bilateral 09/26/2014    L5 TFE   ??? OTHER SURGICAL HISTORY  10/10/2014    bil L5 TFE   ??? OTHER SURGICAL HISTORY  10/31/2014    caudal   ??? SMALL INTESTINE SURGERY  02/20/2015    strangulated left femoral hernia   ??? SPINE SURGERY N/A 01/03/2019    VERTEBRAL AUMENTATION  L1  (C-ARM X2, performed by Loura Back, [place] at [place]   ??? TONSILLECTOMY  1946   ??? UPPER GASTROINTESTINAL ENDOSCOPY N/A 04/24/2019    EGD BIOPSY performed  by Monna Fam, MD at [facility] OR     Family History   Problem Relation Age of Onset   ??? Other Mother         ALS - diagnosed at age 30   ??? Stroke Father         age 66   ??? Cancer Sister         pt thinks it was liver   ??? Cancer Brother         leukemia   ??? Diabetes Maternal Grandfather    ??? Other Brother         drowned   ??? Other Brother         died at age 15; think due to spinal meningitis   ??? Lupus Sister    ??? Alzheimer's Disease Sister    ??? Dementia Sister    ??? Mult Sclerosis Nephew    ??? Mult Sclerosis Daughter      Social History     Tobacco Use   ??? Smoking status: Former Smoker     Packs/day: 1.00     Years: 40.00     Pack years: 40.00     Start date: 01/10/1953     Quit date: 01/11/1988     Years since quitting: 31.6   ??? Smokeless tobacco: Never Used   ??? Tobacco comment: quit 25 years ago 1990   Substance Use Topics   ??? Alcohol use: No     Alcohol/week: 0.0 standard drinks      Current Outpatient Medications   Medication Sig Dispense Refill   ??? CRANBERRY PO Take by mouth     ??? Cholecalciferol (VITAMIN D3) 50 MCG (2000 UT) CAPS Take by mouth     ??? Handicap Placard MISC by Does not apply route Issue parking placard for person with disability; Applicant meets the qualifying disability criteria. Length of time expected to have disability.  Prescription expires in 5 years from issuing date. 1 each 0   ??? LORazepam (ATIVAN) 0.5 MG tablet Take 1 tablet by mouth nightly as needed for Anxiety for up to 90 days. 90 tablet 0   ??? budesonide-formoterol (SYMBICORT) 160-4.5 MCG/ACT AERO Inhale 2 puffs into  the lungs 2 times daily Rinse mouth or brush teeth after each use. 3 Inhaler 3   ??? aspirin 81 MG EC tablet Take 81 mg by mouth daily Indications: takes irregularly      ??? alendronate (FOSAMAX) 70 MG tablet Take 1 tablet by mouth every 7 days 12 tablet 3   ??? CALCIUM CARBONATE-VITAMIN D PO Take 600 mg by mouth 2 times daily Contains 600 mg Calcium & 800 IU Vitamin D3.     ??? citalopram (CELEXA) 20 MG tablet Take 1 tablet by  mouth daily 90 tablet 3   ??? atorvastatin (LIPITOR) 20 MG tablet Take 1 tablet by mouth daily 90 tablet 0   ??? Multiple Vitamins-Minerals (THERAPEUTIC MULTIVITAMIN-MINERALS) tablet Take 1 tablet by mouth daily     ??? albuterol sulfate HFA (VENTOLIN HFA) 108 (90 Base) MCG/ACT inhaler Ventolin HFA 90 mcg/actuation aerosol inhaler   Inhale 2 puffs every 6 hours by inhalation route as needed.   ASTHMA     ??? docusate sodium (COLACE) 100 MG capsule Take 100 mg by mouth nightly      ??? polyvinyl alcohol-povidone (HYPOTEARS) 1.4-0.6 % ophthalmic solution Place 1-2 drops into both eyes as needed.       No current facility-administered medications for this visit.     Allergies   Allergen Reactions   ??? Morphine Other (See Comments)     Night sweats,felt "loopy".   ??? Pcn [Penicillins] Hives   ??? Zithromax [Azithromycin] Hives   ??? Adhesive Tape Rash       Health Maintenance   Topic Date Due   ??? Flu vaccine (1) 09/11/2019   ??? Annual Wellness Visit (AWV)  11/06/2019   ??? Lipid screen  12/03/2019   ??? DTaP/Tdap/Td vaccine (2 - Td or Tdap) 09/28/2027   ??? DEXA (modify frequency per FRAX score)  Completed   ??? Shingles Vaccine  Completed   ??? Pneumococcal 65+ years Vaccine  Completed   ??? COVID-19 Vaccine  Completed   ??? Hepatitis A vaccine  Aged Out   ??? Hepatitis B vaccine  Aged Out   ??? Hib vaccine  Aged Out   ??? Meningococcal (ACWY) vaccine  Aged Out       Subjective:      Review of Systems   Gastrointestinal: Negative for abdominal pain and diarrhea.   Musculoskeletal: Positive for myalgias.   Neurological: Positive for tremors (hands - stable). Negative for headaches.       Objective:     Vitals:    07/30/19 1008   BP: 128/62   Site: Right Upper Arm   Position: Sitting   Pulse: 60   Resp: 17   Temp: 98.6 ??F (37 ??C)   TempSrc: Tympanic   SpO2: 95%   Weight: 131 lb 9.6 oz (59.7 kg)   Height: 5\' 4"  (1.626 m)     Physical Exam  Vitals and nursing note reviewed.   Constitutional:       General: She is not in acute distress.     Appearance: She is  well-developed.   HENT:      Head: Normocephalic and atraumatic.      Right Ear: Tympanic membrane, ear canal and external ear normal.      Left Ear: Tympanic membrane, ear canal and external ear normal.      Nose: Nose normal.      Mouth/Throat:      Mouth: Mucous membranes are moist.      Pharynx: Oropharynx is clear. No  posterior oropharyngeal erythema.   Eyes:      Conjunctiva/sclera: Conjunctivae normal.   Cardiovascular:      Rate and Rhythm: Normal rate and regular rhythm.      Heart sounds: Normal heart sounds.   Pulmonary:      Effort: Pulmonary effort is normal. No respiratory distress.      Breath sounds: Normal breath sounds.   Abdominal:      General: Bowel sounds are normal. There is no distension.      Palpations: Abdomen is soft.      Tenderness: There is no abdominal tenderness.   Musculoskeletal:      Cervical back: Neck supple.   Skin:     General: Skin is warm and dry.      Findings: Ecchymosis (L upper arm) present.   Neurological:      Mental Status: She is alert and oriented to person, place, and time.         Assessment:       Diagnosis Orders   1. Chronic bilateral low back pain, unspecified whether sciatica present  Handicap Placard MISC   2. Neural foraminal stenosis of lumbar spine  Handicap Placard MISC   3. Hyperlipidemia, unspecified hyperlipidemia type  Comprehensive Metabolic Panel    Lipid Panel   4. Impaired fasting glucose  Hemoglobin A1C   5. Excessive cerumen in ear canal, right  69210 - PR REMOVE IMPACTED EAR WAX         Plan:      Return in about 4 months (around 11/30/2019) for f/u labs, BP, reflux.    Orders Placed This Encounter   Procedures   ??? Comprehensive Metabolic Panel     Standing Status:   Future     Standing Expiration Date:   07/10/2020   ??? Lipid Panel     Standing Status:   Future     Standing Expiration Date:   07/10/2020     Order Specific Question:   Is Patient Fasting?/# of Hours     Answer:   12   ??? Hemoglobin A1C     Standing Status:   Future     Standing  Expiration Date:   07/10/2020   ??? 44010 - PR REMOVE IMPACTED EAR WAX     Orders Placed This Encounter   Medications   ??? Handicap Placard MISC     Sig: by Does not apply route Issue parking placard for person with disability; Applicant meets the qualifying disability criteria. Length of time expected to have disability.  Prescription expires in 5 years from issuing date.     Dispense:  1 each     Refill:  0       Patient given educational materials - see patient instructions.  Discussed use, benefit, and side effects of prescribed medications.  All patient questions answered.  Pt voiced understanding.  Reviewed health maintenance.            Electronically signed by [name], DO, DO on 08/11/2019 at 10:53 PM

## 2019-09-11 ENCOUNTER — Encounter

## 2019-09-12 MED ORDER — LORAZEPAM 0.5 MG PO TABS
0.5 MG | ORAL_TABLET | Freq: Every evening | ORAL | 0 refills | Status: DC | PRN
Start: 2019-09-12 — End: 2019-12-04

## 2019-10-17 LAB — COMPREHENSIVE METABOLIC PANEL
ALT: 20 units/L (ref 4–35)
AST: 30 units/L (ref 14–36)
Albumin/Globulin Ratio: 1.4 g/dL
Albumin: 4.4 g/dL (ref 3.5–5.0)
Alkaline Phosphatase: 72 units/L (ref 38–126)
Anion Gap: 7.1 mmol/L
BUN: 24 mg/dL — ABNORMAL HIGH (ref 7–17)
CO2: 30 mmol/L (ref 22–31)
Calcium: 9.9 mg/dL (ref 8.4–10.2)
Chloride: 102 mmol/L (ref 98–120)
Creatinine: 0.8 mg/dL (ref 0.5–1.0)
Gfr Calculated: >60
Globulin: 3.2 g/dL
Glucose: 95 mg/dL (ref 65–105)
Potassium: 4.1 mmol/L (ref 3.6–5.0)
Sodium: 139 mmol/L (ref 135–145)
Total Bilirubin: 0.4 mg/dL (ref 0.2–1.3)
Total Protein, Serum: 7.6 g/dL (ref 6.3–8.2)

## 2019-10-17 LAB — CBC WITH AUTO DIFFERENTIAL
Basophils %: 1.09 (ref 0.0–3.0)
Basophils Absolute: 0.1158 (ref 0.0–0.3)
Eosinophils %: 1.435 (ref 0.0–10.0)
Eosinophils Absolute: 0.1524 (ref 0.0–1.1)
Hematocrit: 41.9 % (ref 37.0–47.0)
Hemoglobin: 14.6 (ref 12.0–16.0)
Lymphocyte %: 19.21 — ABNORMAL LOW (ref 20–51.1)
Lymphocytes Absolute: 2.04 (ref 1.0–5.5)
MCH: 31 pg (ref 28.5–32.5)
MCHC: 34.9 g/dL (ref 32.0–37.0)
MCV: 88.9 fl (ref 80.0–94.0)
Monocytes %: 9.622 — ABNORMAL HIGH (ref 1.7–9.3)
Monocytes Absolute: 1.022 — ABNORMAL HIGH (ref 0.1–1.0)
Neutrophils %: 68.64 (ref 42.2–75.2)
Neutrophils Absolute: 7.29 (ref 2.0–8.1)
Platelets: 203.2 10*3/uL (ref 130–400)
RBC: 4.71 M/uL (ref 4.20–5.40)
RDW: 12.8 % (ref 8.5–15.5)
WBC: 10.6 Thou/mL3 (ref 4.8–10.8)

## 2019-10-17 LAB — TROPONIN I: Troponin I: <0.012 ng/mL (ref 0.000–0.03)

## 2019-10-18 NOTE — Telephone Encounter (Signed)
Spoke with patient. Patient is agreeable to wait to discuss at her next visit with pcp in November. Advised patient that she can come to the urgent care if pain get worse. Patient verbalized understanding.

## 2019-10-18 NOTE — Telephone Encounter (Signed)
Patient was seen in Urgent Care in [facility] the notes are in [place]. The provider in the UC told her that she may need a MRI of her neck. Please review records and call patient back

## 2019-10-18 NOTE — Telephone Encounter (Signed)
Pt will need appt to discuss this.

## 2019-10-28 ENCOUNTER — Telehealth

## 2019-10-28 NOTE — Telephone Encounter (Signed)
Patient wants see neurology.  States her shaking as gotten worse.

## 2019-10-28 NOTE — Telephone Encounter (Signed)
Neuro referral placed at this time, per 7/20 OV note, discussed shakiness but denied referral at that time.

## 2019-11-05 ENCOUNTER — Inpatient Hospital Stay: Admit: 2019-11-05 | Payer: MEDICARE | Primary: Family Medicine

## 2019-11-05 DIAGNOSIS — E785 Hyperlipidemia, unspecified: Secondary | ICD-10-CM

## 2019-11-05 LAB — COMPREHENSIVE METABOLIC PANEL
ALT: 16 U/L (ref 5–33)
AST: 22 U/L (ref <32)
Albumin/Globulin Ratio: 1.5 (ref 1.0–2.5)
Albumin: 4.1 g/dL (ref 3.5–5.2)
Alkaline Phosphatase: 58 U/L (ref 35–104)
Anion Gap: 9 mmol/L (ref 9–17)
BUN: 17 mg/dL (ref 8–23)
Bun/Cre Ratio: 22 — ABNORMAL HIGH (ref 9–20)
CO2: 27 mmol/L (ref 20–31)
Calcium: 9.2 mg/dL (ref 8.6–10.4)
Chloride: 105 mmol/L (ref 98–107)
Creatinine: 0.79 mg/dL (ref 0.50–0.90)
GFR African American: >60 mL/min (ref >60)
GFR Non-African American: >60 mL/min (ref >60)
Glucose: 93 mg/dL (ref 70–99)
Potassium: 4.5 mmol/L (ref 3.7–5.3)
Sodium: 141 mmol/L (ref 135–144)
Total Bilirubin: 0.43 mg/dL (ref 0.3–1.2)
Total Protein: 6.8 g/dL (ref 6.4–8.3)

## 2019-11-05 LAB — LIPID PANEL
Chol/HDL Ratio: 2.8 (ref <5)
Cholesterol: 159 mg/dL (ref <200)
HDL: 57 mg/dL (ref >40)
LDL Cholesterol: 71 mg/dL (ref 0–130)
Triglycerides: 156 mg/dL — ABNORMAL HIGH (ref <150)

## 2019-11-06 LAB — HEMOGLOBIN A1C
Estimated Avg Glucose: 123 mg/dL
Hemoglobin A1C: 5.9 % (ref 4.0–6.0)

## 2019-11-29 ENCOUNTER — Encounter: Payer: MEDICARE | Attending: Family Medicine | Primary: Family Medicine

## 2019-12-04 ENCOUNTER — Ambulatory Visit: Admit: 2019-12-04 | Discharge: 2019-12-04 | Payer: MEDICARE | Attending: Family Medicine | Primary: Family Medicine

## 2019-12-04 DIAGNOSIS — F419 Anxiety disorder, unspecified: Secondary | ICD-10-CM

## 2019-12-04 MED ORDER — LORAZEPAM 0.5 MG PO TABS
0.5 MG | ORAL_TABLET | Freq: Every evening | ORAL | 0 refills | Status: DC | PRN
Start: 2019-12-04 — End: 2020-03-13

## 2019-12-04 MED ORDER — CITALOPRAM HYDROBROMIDE 20 MG PO TABS
20 MG | ORAL_TABLET | Freq: Every day | ORAL | 1 refills | Status: DC
Start: 2019-12-04 — End: 2020-09-01

## 2019-12-04 NOTE — Progress Notes (Signed)
[facility]  [address] [place] [address]  [phone]      Katie Juarez is a 79 y.o. female who presents today for her medical conditions/complaints as noted below.  Katie Juarez is c/o of Hypertension, Diarrhea (after eating full meals), and Other (left arm weak, hard to lift things up with left arm)      HPI:     Pt here today for follow-up of HTN and anxiety.    Results reviewed with pt during OV today - LP at goal; A1c normal and improved; CMP was normal, other than BUN/Cr ratio at 22.      Only taking Lipitor 20 mg only twice weekly.    Has had more weakness in her L arm x past month - feels like everything "weighs 100 lbs".  Has a hard time picking up a gallon of milk.    Taking Celexa 20 mg daily for anxiety - feels like she's not sure that this is working as well for her.  Feels anxious "a lot" and not always sleeping well.  Taking Lorazepam 0.5 mg nightly before bedtime.     BP well-controlled today - 124/80.  Checking her BP occasionally - at most, it is 867'Y systolic.     Going to see Neurologist (Katie. Sherilyn Juarez) on 19/5 for her tremors.  Has noticed that when she Juarez to bed, she can "feel the blood flow in my legs"; also feels like her head's going to sleep on the R side intermittently.      Taking Vit D3 2000 IU daily and Calcium + D BID, along with MVI daily.    Using Symbicort inhaler BID    Going to [place] on Jan 3rd - first of April.      Past Medical History:   Diagnosis Date   ??? Basal cell carcinoma (BCC) of face 10/2018    excised by Katie Katie Juarez    ??? Breast cancer ([place]) 1999    in remission   ??? Constipation 12/27/2018   ??? COPD (chronic obstructive pulmonary disease) ([place])    ??? DDD (degenerative disc disease)    ??? Large bowel obstruction ([place]) 02/2015    [facility]   ??? LBP (low Juarez pain)    ??? Pharyngoesophageal dysphagia    ??? SS (spinal stenosis)       Past Surgical History:   Procedure Laterality Date   ??? APPENDECTOMY  1956   ??? BLADDER  SUSPENSION  1990   ??? BREAST LUMPECTOMY Right 1999   ??? CHOLECYSTECTOMY  1990   ??? EXCISION/BIOPSY Left 11/01/2018    Katie Katie [facility]arez / Katie [facility], for [facility]   ??? FEMORAL HERNIA REPAIR Left 02/20/2015    strangulated with small bowel resection - [facility] [place], [place]   ??? [place] for endometriosis; done in [place]   ??? LUMBAR SPINE SURGERY  01/03/2019    VERTEBRAL AUMENTATION  L1     ??? OTHER SURGICAL HISTORY  11/14/13, 06/23/11, 06/30/11, 07/12/11 12/29/11    L4/L5 IESI   ??? OTHER SURGICAL HISTORY  12/17/2013    L5/S1 IESI   ??? OTHER SURGICAL HISTORY Bilateral 05/30/2014    SIJ and piriformis   ??? OTHER SURGICAL HISTORY Bilateral 09/16/2014    L3, L4, L5 Diagnostic Medial Branch Block   ??? OTHER SURGICAL HISTORY Bilateral 09/26/2014    L5 TFE   ??? OTHER SURGICAL HISTORY  10/10/2014  bil L5 TFE   ??? OTHER SURGICAL HISTORY  10/31/2014    caudal   ??? SMALL INTESTINE SURGERY  02/20/2015    strangulated left femoral hernia   ??? SPINE SURGERY N/A 01/03/2019    VERTEBRAL AUMENTATION  L1  (C-ARM X2, performed by Katie Back, MD at [facility]   ??? TONSILLECTOMY  1946   ??? UPPER GASTROINTESTINAL ENDOSCOPY N/A 04/24/2019    EGD BIOPSY performed by Katie Fam, [facility] at [facility]     Family History   Problem Relation Age of Onset   ??? Other Mother         ALS - diagnosed at age 34   ??? Stroke Father         age 63   ??? Cancer Sister         pt thinks it was liver   ??? Cancer Brother         leukemia   ??? Diabetes Maternal Grandfather    ??? Other Brother         drowned   ??? Other Brother         died at age 12; think due to spinal meningitis   ??? Lupus Sister    ??? Alzheimer's Disease Sister    ??? Dementia Sister    ??? Mult Sclerosis Nephew    ??? Mult Sclerosis Daughter      Social History     Tobacco Use   ??? Smoking status: Former Smoker     Packs/day: 1.00     Years: 40.00     Pack years: 40.00     Start date: 01/10/1953     Quit date: 01/11/1988     Years since quitting: 31.9   ??? Smokeless tobacco: Never Used   ??? Tobacco  comment: quit 25 years ago 1990   Substance Use Topics   ??? Alcohol use: No     Alcohol/week: 0.0 standard drinks      Current Outpatient Medications   Medication Sig Dispense Refill   ??? LORazepam (ATIVAN) 0.5 MG tablet Take 1 tablet by mouth nightly as needed for Anxiety for up to 90 days. 90 tablet 0   ??? citalopram (CELEXA) 20 MG tablet Take 1.5 tablets by mouth daily 135 tablet 1   ??? CRANBERRY PO Take by mouth     ??? Cholecalciferol (VITAMIN D3) 50 MCG (2000 UT) CAPS Take by mouth     ??? Handicap Placard MISC by Does not apply route Issue parking placard for person with disability; Applicant meets the qualifying disability criteria. Length of time expected to have disability.  Prescription expires in 5 years from issuing date. 1 each 0   ??? budesonide-formoterol (SYMBICORT) 160-4.5 MCG/ACT AERO Inhale 2 puffs into the lungs 2 times daily Rinse mouth or brush teeth after each use. 3 Inhaler 3   ??? alendronate (FOSAMAX) 70 MG tablet Take 1 tablet by mouth every 7 days 12 tablet 3   ??? CALCIUM CARBONATE-VITAMIN D PO Take 600 mg by mouth 2 times daily Contains 600 mg Calcium & 800 IU Vitamin D3.     ??? Multiple Vitamins-Minerals (THERAPEUTIC MULTIVITAMIN-MINERALS) tablet Take 1 tablet by mouth daily     ??? albuterol sulfate HFA (VENTOLIN HFA) 108 (90 Base) MCG/ACT inhaler Ventolin HFA 90 mcg/actuation aerosol inhaler   Inhale 2 puffs every 6 hours by inhalation route as needed.   ASTHMA (Patient not taking: Reported on 12/11/2019)     ??? docusate sodium (COLACE)  100 MG capsule Take 100 mg by mouth nightly      ??? polyvinyl alcohol-povidone (HYPOTEARS) 1.4-0.6 % ophthalmic solution Place 1-2 drops into both eyes as needed.     ??? primidone (MYSOLINE) 50 MG tablet 1/2   TABLET     AT  BED  TIME    FOR     6   DAYS,       THEN        ONE  TABLET   AT   BED  TIME 30 tablet 1   ??? aspirin 81 MG EC tablet Take 81 mg by mouth daily Indications: takes irregularly  (Patient not taking: Reported on 12/04/2019)       No current  facility-administered medications for this visit.     Allergies   Allergen Reactions   ??? Morphine Other (See Comments)     Night sweats,felt "loopy".   ??? Pcn [Penicillins] Hives   ??? Zithromax [Azithromycin] Hives   ??? Adhesive Tape Rash       Health Maintenance   Topic Date Due   ??? Annual Wellness Visit (AWV)  12/04/2019   ??? DTaP/Tdap/Td vaccine (2 - Td or Tdap) 09/28/2027   ??? DEXA (modify frequency per FRAX score)  Completed   ??? Flu vaccine  Completed   ??? Shingles Vaccine  Completed   ??? Pneumococcal 65+ years Vaccine  Completed   ??? COVID-19 Vaccine  Completed   ??? Hepatitis A vaccine  Aged Out   ??? Hepatitis B vaccine  Aged Out   ??? Hib vaccine  Aged Out   ??? Meningococcal (ACWY) vaccine  Aged Out       Subjective:      Review of Systems   Constitutional: Negative for unexpected weight change.   Gastrointestinal: Positive for constipation (intermittent; uses Colace as needed) and diarrhea (intermittent).       Objective:     Vitals:    12/04/19 1037   BP: 124/80   Site: Right Upper Arm   Position: Sitting   Cuff Size: Medium Adult   Pulse: 66   Temp: 97.5 ??F (36.4 ??C)   TempSrc: Temporal   SpO2: 96%   Weight: 135 lb 9.6 oz (61.5 kg)   Height: 5\' 4"  (1.626 m)     Physical Exam  Vitals and nursing note reviewed.   Constitutional:       General: She is not in acute distress.     Appearance: She is well-developed.   HENT:      Head: Normocephalic and atraumatic.      Right Ear: Tympanic membrane, ear canal and external ear normal.      Left Ear: Tympanic membrane, ear canal and external ear normal.      Nose: Nose normal.      Mouth/Throat:      Mouth: Mucous membranes are moist.      Pharynx: Oropharynx is clear. No posterior oropharyngeal erythema.   Eyes:      Conjunctiva/sclera: Conjunctivae normal.   Cardiovascular:      Rate and Rhythm: Normal rate and regular rhythm.      Heart sounds: Normal heart sounds.   Pulmonary:      Effort: Pulmonary effort is normal. No respiratory distress.      Breath sounds: Normal breath  sounds.   Abdominal:      General: Bowel sounds are normal. There is no distension.      Palpations: Abdomen is soft.  Tenderness: There is no abdominal tenderness.   Musculoskeletal:      Cervical Juarez: Neck supple.   Skin:     General: Skin is warm and dry.   Neurological:      Mental Status: She is alert and oriented to person, place, and time.         Assessment:      1. Anxiety  -     LORazepam (ATIVAN) 0.5 MG tablet; Take 1 tablet by mouth nightly as needed for Anxiety for up to 90 days., Disp-90 tablet, R-0Normal  -     citalopram (CELEXA) 20 MG tablet; Take 1.5 tablets by mouth daily, Disp-135 tablet, R-1Normal  2. Essential hypertension  3. Osteopenia of multiple sites  4. Tremor         Plan:      Pt will increase her water intake and continue healthy diet/regular exercise.  Will re-check CMP, LP, and A1c again in 1 year.        Return in about 5 months (around 05/03/2020) for f/u HTN, tremor, anxiety.    No orders of the defined types were placed in this encounter.    Orders Placed This Encounter   Medications   ??? LORazepam (ATIVAN) 0.5 MG tablet     Sig: Take 1 tablet by mouth nightly as needed for Anxiety for up to 90 days.     Dispense:  90 tablet     Refill:  0   ??? citalopram (CELEXA) 20 MG tablet     Sig: Take 1.5 tablets by mouth daily     Dispense:  135 tablet     Refill:  1       Patient given educational materials - see patient instructions.  Discussed use, benefit, and side effects of prescribed medications.  All patient questions answered.  Pt voiced understanding.  Reviewed health maintenance.            Electronically signed by [name], DO, DO on 12/16/2019 at 12:04 AM

## 2019-12-04 NOTE — Patient Instructions (Signed)
Patient Education        Learning About Anxiety Disorders  What are anxiety disorders?     Anxiety disorders are a type of medical problem. They cause severe anxiety. When you feel anxious, you feel that something bad is about to happen. This feeling interferes with your life.  These disorders include:  ?? Generalized anxiety disorder. You feel worried and stressed about many everyday events and activities. This goes on for several months and disrupts your life on most days.  ?? Panic disorder. You have repeated panic attacks. A panic attack is a sudden, intense fear or anxiety. It may make you feel short of breath. Your heart may pound.  ?? Social anxiety disorder. You feel very anxious about what you will say or do in front of people. For example, you may be scared to talk or eat in public. This problem affects your daily life.  ?? Phobias. You are very scared of a specific object, situation, or activity. For example, you may fear spiders, high places, or small spaces.  What are the symptoms?  Generalized anxiety disorder  Symptoms may include:  ?? Feeling worried and stressed about many things almost every day.  ?? Feeling tired or irritable. You may have a hard time concentrating.  ?? Having headaches or muscle aches.  ?? Having a hard time getting to sleep or staying asleep.  Panic disorder  You may have repeated panic attacks when there is no reason for feeling afraid. You may change your daily activities because you worry that you will have another attack.  Symptoms may include:  ?? Intense fear, terror, or anxiety.  ?? Trouble breathing or very fast breathing.  ?? Chest pain or tightness.  ?? A heartbeat that races or is not regular.  Social anxiety disorder  Symptoms may include:  ?? Fear about a social situation, such as eating in front of others or speaking in public. You may worry a lot. Or you may be afraid that something bad will happen.  ?? Anxiety that can cause you to blush, sweat, and feel shaky.  ?? A  heartbeat that is faster than normal.  ?? A hard time focusing.  Phobias  Symptoms may include:  ?? More fear than most people of being around an object, being in a situation, or doing an activity. You might also be stressed about the chance of being around the thing you fear.  ?? Worry about losing control, panicking, fainting, or having physical symptoms like a faster heartbeat when you are around the situation or object.  How are these disorders treated?  Anxiety disorders can be treated with medicines or counseling. A combination of both may be used.  Medicines may include:  ?? Antidepressants. These may help your symptoms by keeping chemicals in your brain in balance.  ?? Benzodiazepines. These may give you short-term relief of your symptoms.  Some people use cognitive-behavioral therapy. A therapist helps you learn to change stressful or bad thoughts into helpful thoughts.  Lead a healthy lifestyle  A healthy lifestyle may help you feel better.  ?? Get at least 30 minutes of exercise on most days of the week. Walking is a good choice.  ?? Eat a healthy diet. Include fruits, vegetables, lean proteins, and whole grains in your diet each day.  ?? Try to go to bed at the same time every night. Try for 8 hours of sleep a night.  ?? Find ways to manage stress. Try relaxation exercises.  ??   Avoid alcohol and illegal drugs.  Follow-up care is a key part of your treatment and safety. Be sure to make and go to all appointments, and call your doctor if you are having problems. It's also a good idea to know your test results and keep a list of the medicines you take.  Where can you learn more?  Go to [email or web address] and sign in to your MyChart account. Enter [phone] in the [place] box to learn more about "Learning About Anxiety Disorders."     If you do not have an account, please click on the "Sign Up Now" link.  Current as of: June 26, 2019??????????????????????????????Content Version: 13.0  ?? 2006-2021  [employer].   Care instructions adapted under license by [facility]. If you have questions about a medical condition or this instruction, always ask your healthcare professional. [place] any warranty or liability for your use of this information.

## 2019-12-11 ENCOUNTER — Inpatient Hospital Stay: Admit: 2019-12-11 | Payer: MEDICARE | Primary: Family Medicine

## 2019-12-11 ENCOUNTER — Ambulatory Visit: Admit: 2019-12-11 | Discharge: 2019-12-11 | Payer: MEDICARE | Attending: Neurology | Primary: Family Medicine

## 2019-12-11 DIAGNOSIS — R251 Tremor, unspecified: Secondary | ICD-10-CM

## 2019-12-11 DIAGNOSIS — M5440 Lumbago with sciatica, unspecified side: Secondary | ICD-10-CM

## 2019-12-11 LAB — TSH: TSH: 0.93 mIU/L (ref 0.30–5.00)

## 2019-12-11 MED ORDER — PRIMIDONE 50 MG PO TABS
50 MG | ORAL_TABLET | ORAL | 1 refills | Status: DC
Start: 2019-12-11 — End: 2020-01-02

## 2019-12-11 NOTE — Progress Notes (Signed)
This Probation officer contacted scheduling to schedule the following testing: EEG, EMG bilateral uppers, CT head wo contrast, carotid doppler and TCD - patient made aware, will contact office with further needs.

## 2019-12-11 NOTE — Patient Instructions (Signed)
*   FALL   PRECAUTIONS.          *  USE   WALKING  ASSISTANCE  DEVICES     QUAD  CANE       *   ADEQUATE   FLUID  INTAKE   AND  ELECTROLYTE  BALANCE           * KEEP  DAIRY  OF   THE  NEUROLOGICAL  SYMPTOMS          *  TO  MAINTAIN  REGULAR  SLEEP  WAKE  CYCLES.     *   TO  HAVE  ADEQUATE  REST  AND   SLEEP    HOURS.        *    AVOID  USAGE OF   TOBACCO,  EXCESSIVE  ALCOHOL                AND   ILLEGAL   SUBSTANCES,  IF  ANY          *  Maintain   Healthy  Life Style    With   Periodic  Monitoring  Of      Any  Medical  Conditions  Including   Elevated  Blood  Pressure,  Lipid  Profile,     Blood  Sugar levels  And   Heart  Disease.                *   Period   Screening  For  Cancers  Involving  Breast,  Colon,   lungs  And  Other  Organs  As  Applicable,  In consultation   With  Your  Primary Care Providers.                    *  If   There is  Any  Significant  Worsening   Of  Current  Symptoms  And  Or  If    Any additional  New  Neurological  Symptoms                 Or  Significant  Concerns   Should  Call  911 or  Go  To  Emergency  Department  For  Further  Immediate  Evaluation.

## 2019-12-11 NOTE — Progress Notes (Signed)
[facility]  Neurology    [address] [place]  [address]  [phone]   Fax: [phone]        SUBJECTIVE:       PATIENT ID:  Katie Juarez is a  RIGHT   HANDED 79 y.o. female.      Neurologic Problem  The patient's primary symptoms include clumsiness, focal sensory loss, focal weakness, a loss of balance, memory loss and weakness. The patient's pertinent negatives include no altered mental status, near-syncope, slurred speech, syncope or visual change. Primary symptoms comment: TREMORS,    MILD  DYPHAGIA,  DIZZINESS. This is a chronic problem. The neurological problem developed gradually. The problem has been waxing and waning since onset. Associated symptoms include back pain, dizziness, light-headedness and neck pain. Pertinent negatives include no abdominal pain, auditory change, aura, bladder incontinence, bowel incontinence, chest pain, confusion, diaphoresis, fatigue, fever, headaches, nausea, palpitations, shortness of breath, vertigo or vomiting. Past treatments include bed rest and sleep. The treatment provided no relief. Her past medical history is significant for a bleeding disorder, dementia and mood changes. There is no history of a clotting disorder, a CVA, head trauma, liver disease or seizures.               History obtained from  The   PATIENT         and other  available   medical  records   were  Also  reviewed.          The  Duration,  Quality,  Severity,  Location,  Timing,  Context,  Modifying  Factors   Of   The   Chief   Complaint       And  Present  Illness  Was   Reviewed   In   Chronological   [place].                                              PATIENT'S  MAIN  CONCERNS INCLUDE :                       1)      H/O    WORSENING  OF    TREMORS      HEAD ,  NECK,                                 BOTH   UPPER  ARMS                                 AND  LEGS       SINCE      April   2021                             2)       H/O     DIFFICULTY  WITH    WRITING,      EATING       DUE  TO  HER  TREMORS                          3)      H/O    CHRONIC   ANXIETY      WHICH   MAKES     HER  TREMORS    WORSE                                     -   ON     ATIVAN    AT  BED  TIME     AND    CELEXA                            4)      PREVIOUS     H/O     DEPRESSION                                  H/O     ELECTRO CONVULSIVE  THERAPY     MORE   THAN  50   YEARS  AGO                            5)       H/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                         6)      H/O   INTERMITTENT  DIZZINESS                    7)      H/O     CHRONIC  MILD  MEMORY  PROBLEMS                        8)      H/O    ADVANCED   CERVICAL   SPONDYLOSIS                              H/O      RIGHT  SIDED   NECK  STIFFNESS      AND                                      TINGLING                       9)       H/O     CHRONIC  MILD   BALANCE  PROBLEMS                        10)       PREVIOUS     FALLING    IN   DEC.   2020                              H/O  LUMBAR    SURGERY   IN     DEC.  2020                        11)      H/O      WEAKNESS    IN   LEFT  UPPER  EXTREMITY                                     SINCE       SEPT.   2021                               CONCERN   FOR   REMOTE      STROKE                                   AND   CERVICAL  RADICULOPATHY                       12)      H/O     MILD     DYSPHAGIA        SINCE  MAY   2021                        13)     CO  MORBID  MULTIPLE  MEDICAL  CONDITIONS                           BEING  FOLLOWED  BY    HER    PCP                         14)      H/O     CHRONIC    MILD     EXCESSIVE  BLEEDING                                    NOT  TAKING     ASA    80   MG                                                              15)       IN  VIEW  OF  THE  PATIENT'S    MULTIPLE   NEUROLOGICAL                           SYMPTOMS  AND  CONCERNS    FOR  PROLONGED   DURATION,                            AND    MULTIPLE   CO MORBID  MEDICAL  CONDITIONS,  PATIENT    NEEDS  NEURO  DIAGNOSTIC  EVALUATIONS                      FOR   ANY   NEUROLOGICAL  PATHOLOGIES    AND  OTHER                        CORRECTABLE   ETIOLOGIES;     AND                              PATIENT  REQUESTS   THE  SAME.                             16)       VARIOUS  RISK   FACTORS   WERE  REVIEWED   AND   DISCUSSED.                       PATIENT   HAS  MULTIPLE   MEDICAL, NEUROLOGICAL                        AND   MENTAL HEALTH   PROBLEMS                     PATIENT'S   MANAGEMENT  IS  CHALLENGING.                                        PRECIPITATING  FACTORS: including  fever/infection, exertion/relaxation, position change, stress,                weather change,   medications/alcohol, time of day/darkness/light  Are  absent                                                          MODIFYING  FACTORS:  fever/infection, exertion/relaxation, position change, stress, weather change,               medications/alcohol, time of day/darkness/light  Are  absent                Patient   Indicates   The  Presence   And  The  Absence  Of  The  Following    Associated  And             Additional  Neurological    Symptoms:                                Balance  And coordination   problems  present           Gait problems     present            Headaches      absent              Migraines           absent           Memory problemspresent  Confusion        absent            Paresthesia   numbness          present           Seizures  And  Starring  Episodes           absent           Syncope,  Near  syncopal episodes         absent           Speech   problems           absent             Swallowing   Problems      absent            Dizziness,  Light headedness           present              Vertigo        absent             Generalized   Weakness    absent              focal  Weakness     present             Tremors          present              Sleep  Problems     present             History  Of   Recent  Head  Injury     absent             History  Of   Recent  TIA     absent             History  Of   Recent    Stroke     absent             Neck  Pain   and   Neck muscle  Spasms  present               Radiating  down   And   Weakness           absent            Lower back   Pain  And     Spasms  present              Radiating    Down   And   Weakness          absent                H/OFALLS        present               History  Of   Visual  Symptoms    absent                  Associated   Diplopia       absent                                               Also   Additional   Symptoms   Present  As  Documented    In   The   detailed                  Review  Of  Systems   And    Please   Refer   To    Them for   Additional    Information.                    Any components  That are either  Unobtainable  Or  Limited  In   HPI, ROS  And/or PFSH   Are                   Due   ToPatient's  Medical  Problems,  Clinical  Condition   and/or lack of                                 other    Alternate   resources.            RECORDS   REVIEWED:    historical medical records           INFORMATION   REVIEWED:     MEDICAL   HISTORY,SURGICAL   HISTORY,     MEDICATIONS   LIST,   ALLERGIES AND  DRUG  INTOLERANCES,       FAMILY   HISTORY,  SOCIAL  HISTORY,      PROBLEM  LIST   FOR  PATIENT  CARE   COORDINATION          Past Medical History:   Diagnosis Date   ??? 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 Breast cancer ([place]) 1999    in remission   ??? Constipation 12/27/2018   ??? COPD (chronic obstructive pulmonary disease) ([place])    ??? DDD (degenerative disc disease)    ??? Large bowel obstruction ([place]) 02/2015    [facility]   ??? LBP (low back pain)    ??? Pharyngoesophageal dysphagia    ??? SS (spinal stenosis)          Past Surgical History:   Procedure Laterality Date   ??? APPENDECTOMY  1956   ??? BLADDER SUSPENSION  1990   ???  BREAST LUMPECTOMY Right 1999   ??? CHOLECYSTECTOMY  1990   ??? EXCISION/BIOPSY Left 11/01/2018    Dr Yehuda Savannah / [name], for [facility]   ??? FEMORAL HERNIA REPAIR Left 02/20/2015    strangulated with small bowel resection - [facility], [place]   ??? [place] for endometriosis; done in [place]   ??? LUMBAR SPINE SURGERY  01/03/2019    VERTEBRAL AUMENTATION  L1     ??? OTHER SURGICAL HISTORY  11/14/13, 06/23/11, 06/30/11, 07/12/11 12/29/11    L4/L5 IESI   ??? OTHER SURGICAL HISTORY  12/17/2013    L5/S1 IESI   ??? OTHER SURGICAL HISTORY Bilateral 05/30/2014    SIJ and piriformis   ??? OTHER SURGICAL HISTORY Bilateral 09/16/2014    L3, L4, L5 Diagnostic Medial Branch Block   ??? OTHER SURGICAL HISTORY Bilateral 09/26/2014    L5 TFE   ??? OTHER SURGICAL HISTORY  10/10/2014    bil L5 TFE   ??? OTHER SURGICAL HISTORY  10/31/2014    caudal   ??? SMALL INTESTINE SURGERY  02/20/2015  strangulated left femoral hernia   ??? SPINE SURGERY N/A 01/03/2019    VERTEBRAL AUMENTATION  L1  (C-ARM X2, performed by Loura Back, MD at [place]   ??? TONSILLECTOMY  1946   ??? UPPER GASTROINTESTINAL ENDOSCOPY N/A 04/24/2019    EGD BIOPSY performed by Monna Fam, MD at [place]         Current Outpatient Medications   Medication Sig Dispense Refill   ??? primidone (MYSOLINE) 50 MG tablet 1/2   TABLET     AT  BED  TIME    FOR     6   DAYS,       THEN        ONE  TABLET   AT   BED  TIME 30 tablet 1   ??? [START ON 12/13/2019] LORazepam (ATIVAN) 0.5 MG tablet Take 1 tablet by mouth nightly as needed for Anxiety for up to 90 days. 90 tablet 0   ??? citalopram (CELEXA) 20 MG tablet Take 1.5 tablets by mouth daily 135 tablet 1   ??? CRANBERRY PO Take by mouth     ??? Cholecalciferol (VITAMIN D3) 50 MCG (2000 UT) CAPS Take by mouth     ??? Handicap Placard MISC by Does not apply route Issue parking placard for person with disability; Applicant meets the qualifying disability criteria. Length of time expected to have disability.  Prescription  expires in 5 years from issuing date. 1 each 0   ??? budesonide-formoterol (SYMBICORT) 160-4.5 MCG/ACT AERO Inhale 2 puffs into the lungs 2 times daily Rinse mouth or brush teeth after each use. 3 Inhaler 3   ??? alendronate (FOSAMAX) 70 MG tablet Take 1 tablet by mouth every 7 days 12 tablet 3   ??? CALCIUM CARBONATE-VITAMIN D PO Take 600 mg by mouth 2 times daily Contains 600 mg Calcium & 800 IU Vitamin D3.     ??? Multiple Vitamins-Minerals (THERAPEUTIC MULTIVITAMIN-MINERALS) tablet Take 1 tablet by mouth daily     ??? docusate sodium (COLACE) 100 MG capsule Take 100 mg by mouth nightly      ??? polyvinyl alcohol-povidone (HYPOTEARS) 1.4-0.6 % ophthalmic solution Place 1-2 drops into both eyes as needed.     ??? aspirin 81 MG EC tablet Take 81 mg by mouth daily Indications: takes irregularly  (Patient not taking: Reported on 12/04/2019)     ??? albuterol sulfate HFA (VENTOLIN HFA) 108 (90 Base) MCG/ACT inhaler Ventolin HFA 90 mcg/actuation aerosol inhaler   Inhale 2 puffs every 6 hours by inhalation route as needed.   ASTHMA (Patient not taking: Reported on 12/11/2019)       No current facility-administered medications for this visit.         Allergies   Allergen Reactions   ??? Morphine Other (See Comments)     Night sweats,felt "loopy".   ??? Pcn [Penicillins] Hives   ??? Zithromax [Azithromycin] Hives   ??? Adhesive Tape Rash         Family History   Problem Relation Age of Onset   ??? Other Mother         ALS - diagnosed at age 77   ??? Stroke Father         age 80   ??? Cancer Sister         pt thinks it was liver   ??? Cancer Brother         leukemia   ??? Diabetes Maternal Grandfather    ??? Other Brother  drowned   ??? Other Brother         died at age 33; think due to spinal meningitis   ??? Lupus Sister    ??? Alzheimer's Disease Sister    ??? Dementia Sister    ??? Mult Sclerosis Nephew    ??? Mult Sclerosis Daughter          Social History     Socioeconomic History   ??? Marital status: Widowed     Spouse name: Not on file   ??? Number of  children: Not on file   ??? Years of education: Not on file   ??? Highest education level: Not on file   Occupational History   ??? Not on file   Tobacco Use   ??? Smoking status: Former Smoker     Packs/day: 1.00     Years: 40.00     Pack years: 40.00     Start date: 01/10/1953     Quit date: 01/11/1988     Years since quitting: 31.9   ??? Smokeless tobacco: Never Used   ??? Tobacco comment: quit 25 years ago 1990   Vaping Use   ??? Vaping Use: Never used   Substance and Sexual Activity   ??? Alcohol use: No     Alcohol/week: 0.0 standard drinks   ??? Drug use: Never   ??? Sexual activity: Not on file   Other Topics Concern   ??? Not on file   Social History Narrative   ??? Not on file     Social Determinants of Health     Financial Resource Strain:    ??? Difficulty of Paying Living Expenses: Not on file   Food Insecurity:    ??? Worried About Running Out of Food in the Last Year: Not on file   ??? Ran Out of Food in the Last Year: Not on file   Transportation Needs:    ??? Lack of Transportation (Medical): Not on file   ??? Lack of Transportation (Non-Medical): Not on file   Physical Activity:    ??? Days of Exercise per Week: Not on file   ??? Minutes of Exercise per Session: Not on file   Stress:    ??? Feeling of Stress : Not on file   Social Connections:    ??? Frequency of Communication with Friends and Family: Not on file   ??? Frequency of Social Gatherings with Friends and Family: Not on file   ??? Attends Religious Services: Not on file   ??? Active Member of Clubs or Organizations: Not on file   ??? Attends Archivist Meetings: Not on file   ??? Marital Status: Not on file   Intimate Partner Violence:    ??? Fear of Current or Ex-Partner: Not on file   ??? Emotionally Abused: Not on file   ??? Physically Abused: Not on file   ??? Sexually Abused: Not on file   Housing Stability:    ??? Unable to Pay for Housing in the Last Year: Not on file   ??? Number of Places Lived in the Last Year: Not on file   ??? Unstable Housing in the Last Year: Not on file        Vitals:    12/11/19 1027   BP: 122/80   Pulse: 63   SpO2: 94%         Wt Readings from Last 3 Encounters:   12/11/19 136 lb (61.7 kg)   12/04/19 135 lb 9.6  oz (61.5 kg)   07/30/19 131 lb 9.6 oz (59.7 kg)         BP Readings from Last 3 Encounters:   12/11/19 122/80   12/04/19 124/80   07/30/19 128/62           Hematology and Coagulation    Lab Results   Component Value Date    WBC 10.6 10/17/2019    WBC 10.6 05/20/2019    RBC 4.71 10/17/2019    HGB 14.6 10/17/2019    HCT 41.9 10/17/2019    MCV 88.9 10/17/2019    MCH 31.0 10/17/2019    MCHC 34.9 10/17/2019    RDW 12.8 10/17/2019    PLT 203.2 10/17/2019    MPV 10.5 05/20/2019           Chemistries    Lab Results   Component Value Date    NA 141 11/05/2019    K 4.5 11/05/2019    CL 105 11/05/2019    CO2 27 11/05/2019    BUN 17 11/05/2019    CREATININE 0.79 11/05/2019    CALCIUM 9.2 11/05/2019    PROT 6.8 11/05/2019    LABALBU 4.1 11/05/2019    LABALBU 4.4 10/17/2019    BILITOT 0.43 11/05/2019    ALKPHOS 58 11/05/2019    AST 22 11/05/2019    ALT 16 11/05/2019     Lab Results   Component Value Date    ALKPHOS 58 11/05/2019    ALT 16 11/05/2019    AST 22 11/05/2019    PROT 6.8 11/05/2019    BILITOT 0.43 11/05/2019    LABALBU 4.1 11/05/2019    LABALBU 4.4 10/17/2019     Lab Results   Component Value Date    BUN 17 11/05/2019    CREATININE 0.79 11/05/2019     Lab Results   Component Value Date    CALCIUM 9.2 11/05/2019    MG 1.8 01/13/2019     Lab Results   Component Value Date    AST 22 11/05/2019    ALT 16 11/05/2019           Review of Systems   Constitutional: Negative for appetite change, chills, diaphoresis, fatigue, fever and unexpected weight change.   HENT: Negative for congestion, dental problem, drooling, ear discharge, ear pain, facial swelling, hearing loss, mouth sores, nosebleeds, postnasal drip, sinus pressure, sore throat, tinnitus, trouble swallowing and voice change.    Eyes: Negative for photophobia, pain, discharge, redness, itching and visual  disturbance.   Respiratory: Negative for apnea, cough, choking, chest tightness, shortness of breath and wheezing.    Cardiovascular: Negative for chest pain, palpitations, leg swelling and near-syncope.   Gastrointestinal: Negative for abdominal distention, abdominal pain, blood in stool, bowel incontinence, constipation, diarrhea, nausea and vomiting.   Endocrine: Negative for cold intolerance, heat intolerance, polydipsia, polyphagia and polyuria.   Genitourinary: Negative for bladder incontinence.   Musculoskeletal: Positive for back pain and neck pain. Negative for arthralgias, gait problem, joint swelling, myalgias and neck stiffness.   Skin: Negative for color change, pallor, rash and wound.   Allergic/Immunologic: Negative for environmental allergies, food allergies and immunocompromised state.   Neurological: Positive for dizziness, tremors, focal weakness, weakness, light-headedness, numbness and loss of balance. Negative for vertigo, seizures, syncope, facial asymmetry, speech difficulty and headaches.   Hematological: Negative for adenopathy. Does not bruise/bleed easily.   Psychiatric/Behavioral: Positive for decreased concentration, memory loss and sleep disturbance. Negative for agitation, behavioral problems, confusion, dysphoric mood, hallucinations, self-injury and  suicidal ideas. The patient is nervous/anxious. The patient is not hyperactive.          OBJECTIVE:      Physical Exam  Constitutional:       Appearance: She is well-developed.   HENT:      Head: Normocephalic and atraumatic. No raccoon eyes or Battle's sign.      Right Ear: External ear normal.      Left Ear: External ear normal.      Nose: Nose normal.   Eyes:      Conjunctiva/sclera: Conjunctivae normal.      Pupils: Pupils are equal, round, and reactive to light.   Neck:      Thyroid: No thyroid mass or thyromegaly.      Vascular: No carotid bruit.      Trachea: No tracheal deviation.      Meningeal: Brudzinski's sign and Kernig's  sign absent.   Cardiovascular:      Rate and Rhythm: Normal rate and regular rhythm.   Pulmonary:      Effort: Pulmonary effort is normal.   Musculoskeletal:         General: No tenderness.      Cervical back: Normal range of motion and neck supple. No rigidity. No muscular tenderness. Normal range of motion.   Skin:     General: Skin is warm.      Coloration: Skin is not pale.      Findings: No erythema or rash.      Nails: There is no clubbing.   Psychiatric:         Attention and Perception: She is attentive.         Mood and Affect: Mood is anxious. Mood is not depressed. Affect is not labile, blunt or inappropriate.         Speech: She is communicative. Speech is delayed. Speech is not rapid and pressured, slurred or tangential.         Behavior: Behavior is slowed. Behavior is not agitated, aggressive, withdrawn, hyperactive or combative. Behavior is cooperative.         Thought Content: Thought content is not paranoid or delusional. Thought content does not include homicidal or suicidal ideation. Thought content does not include homicidal or suicidal plan.         Cognition and Memory: Cognition is impaired. Memory is impaired. She exhibits impaired recent memory. She does not exhibit impaired remote memory.         Judgment: Judgment is not impulsive or inappropriate.             NEUROLOGICALEXAMINATION :      A) MENTAL STATUS:                   Alert and  oriented  To time, place  And  Person.                  No Aphasia.  No  Dysarthria.                    Able   To  Follow     SIMPLE    commands   without   Any  Difficulty.                   No right  To left confusion.                  Normal  Speech  And language function.  Insight and  Judgment ,Fund  Of  Knowledge   within normal limits                Recent  And  Remote memory  DECREASED                  Attention &  Concentration are    DECREASED                         B) CRANIAL NERVES :        CN : Visual  Acuity  And  Visual  fields  within normal limits               Fundi  Could  Not  Be  Could  Not  Be  Evaluated.           3,4,6 CN : Both  Pupils are   Reactive and  Equal.  Movements  Are  Intact.                             No  Nystagmus.  No  INO.  No  Afferent  Pupillary  Defect noted.          5 CN :  Normal  Facial sensations and Corneal  Reflexes           7 CN:  Normal  Facial  Symmetry  And  Strength.  No facial  Weakness.           8 CN :  Hearing  Appears    DECREASED          9, 10 CN: Normal   spontaneous, reflex   palate   movements         11 CN:   Normal  Shoulder  shrug and  strength         12 CN :   Normal  Tongue movements and  Tongue  In midline                        No tongue   Fasciculations or atrophy       C) MOTOR  EXAM:                 Strength  In upper  And  Lower   extremities     4 + /5                 No  Drift.     No  Atrophy               Rapid   alternating  And  repetitions  Movements    DECREASED                   Muscle  Tone  In upper  And  Lower  Extremities  normal                No rigidity.  No  Spasticity.                 Bradykinesia   absent                 No  Asterixis.                SIGNIFICANT    TREMORS   BOTH    HANDS,   HEAD  &  NECK                       NO   Resting   Tremor   absent                    No   other  Abnormal  Movements noted           D) SENSORY :               Light   touch, pinprick,   position  And  Vibration   DECREASED        E) REFLEXES:                   Deep  Tendon  Reflexes   Abnormal                F) COORDINATION  AND  GAIT :                                Station and  Gait    SLOW                             Romberg 's test    POSITIVE                            Ataxia negative        ASSESSMENT:      Patient Active Problem List   Diagnosis   ??? Lumbar facet joint syndrome ([place])   ??? 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 Pain of both sacroiliac joints   ??? Piriformis syndrome of  left side   ??? Lumbar radicular pain   ??? Neural foraminal stenosis of lumbar spine   ??? Chronic low back pain   ??? Spinal stenosis, lumbar   ??? Diverticulitis   ??? Acquired absence of other specified parts of digestive tract   ??? Arthrodesis status   ??? Asymptomatic menopausal state   ??? Chronic obstructive pulmonary disease, unspecified ([place])   ??? Extravasation of urine   ??? 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 Hyperlipidemia, unspecified   ??? Closed compression fracture of body of L1 vertebra (HCC)   ??? Lumbar burst fracture, open, initial encounter ([place])   ??? Pharyngoesophageal dysphagia   ??? Anxiety   ??? Tremors of nervous system   ??? Benign essential tremor   ??? Dizziness   ??? Memory problem   ??? Balance problems   ??? Chronic cerebral ischemia   ??? Neck stiffness   ??? Tingling sensation   ??? Left arm weakness   ??? Dysphagia   ??? Brain dysfunction           ?? ?? ?? ?? ?? ?? ?? ??[facility] ??   ???? ?? ?? ?? ?? ?? ?? ?? ?? ?? ?? ?? ?? ?? ?? ?? ?? ?? ?? [address] ??   ???? ?? ?? ?? ?? ?? ?? ?? ?? ?? ?? ?? ?? ?? ?? ?? ?? ?? ?? ?? [place], [place] [address] ??   ???? ?? ?? ?? ?? ?? ?? ?? ?? ?? ?? ?? ?? ?? ?? ?? ?? ?? ?? ?? ?? ?? ?? ?? ?? ??   ???? ?? ?? ?? ?? ?? ?? ?? ?? ?? ?? ?? ?? ??  XRay Report / XR cervical sp 3V LAT/AP/ODON ??   ???? ?? ?? ?? ?? ?? ?? ?? ?? ?? ?? ?? ?? ?? ?? ?? ?? ?? ?? ?? ?? ?? ?? Signed ??   ????   Patient: Katie Juarez,Katie Juarez ?? ?? ?? ?? ?? ?? ?? ?? ?? ?? ?? ?? ?? ?? ?? ?? ?? ?? ?? ?? ?? ?? ?? ?? ?? ?? ?? ?? ?? ?? ?? ??MR#: [id number] ??   [id number] ?? ?? ?? ?? ??   DOB: 08/14/1940 ?? ?? ?? ?? ?? ?? ?? ?? ?? ?? ?? ?? ?? ?? ?? ?? ?? ?? ?? ?? ?? ?? ?? ?? ?? ?? ?? ?? ?? ?? ?? ??[id number] ?? ?? ??   ???? ?? ??   Age/Sex: 29 / F ?? ?? ?? ?? ?? ?? ?? ?? ?? ?? ?? ?? ?? ?? ?? ?? ?? ?? ?? ?? ?? ?? ?? ?? ?? ?? ?? ?? ?? ?? ?? ??ADM Date: 10/17/19 ?? ??    ???? ?? ??    Loc: LAB ?? ?? ?? ?? ?? ?? ?? ?? ?? ?? ?? ?? ?? ?? ?? ?? ?? ?? ?? ?? ?? ?? ?? ?? ?? ?? ?? ?? ?? ?? ?? ?? ?? ?? ?? ?? ?? ?? ?? ?? ?? ??   Attending Dr: Ardyth Man PA ??   Primary Care Dr. Leandrew Koyanagi [facility] DO ??   ????   ????   Ordering Physician: Ardyth Man PA ??   Date of Service: 10/17/19 ??   Procedure(s): XR cervical sp 3V LAT/AP/ODON ??   Accession Number(s): [id number] ??   ????   cc: Black,Karin K DO;  Ardyth Man PA ??    ????   ????   TECHNIQUE/VIEWS: ?? 3 views ??    ????   CLINICAL HISTORY: ?? 79 year-old female with left arm weakness. ??No injury. ??   ????   COMPARISON: ??No relevant prior studies available ??   ????   FINDINGS: ?? ??   ????   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 ?? ??   ????   There is straightening of the normal cervical lordosis. ??   ????   No definite fractures or subluxations. ??   ????   No prevertebral soft tissue swelling. ??    ????   IMPRESSION: ??   1. ??Advanced degenerative spondylosis. ??   ????   ????   ????   Dictated By: ?? ?? ?? ??Ewonus,John DO ?? ?? ?? ?? ?? ?? ?? ?? ?? ?? ?? ?? ?? ?? ?? ?? ?? ?? ?? ?? ?? ?? ?? ??   Signed By: ?? ?? ?? ?? ??<Electronically signed by Charlotta Newton, DO> ?? ?? ?? ?? ?? ?? ?? ?? ?? ?? ?? ?? ?? ?? ?? ?? ?? ?? ?? ??   ????10/18/19 0812 ??   ???? ?? ?? ?? ?? ?? ?? ?? ?? ?? ?? ?? ?? ?? ?? ?? ?? ?? ?? ?? ?? ?? ?? ?? ?? ?? ?? ?? ?? ?? ?? ?? ?? ?? ?? ?? ?? ?? ?? ?? ??   ???? ?? ?? ?? ?? ?? ?? ?? ?? ?? ?? ?? ?? ?? ?? ?? ?? ?? ?? ?? ?? ?? ?? ?? ?? ?? ?? ??        CT OF THE HEAD WITHOUT CONTRAST ??01/13/2019 3:18 pm   ??  TECHNIQUE:   CT of the head was performed without the administration of intravenous   contrast. Dose modulation, iterative reconstruction, and/or weight based   adjustment of the mA/kV was utilized to reduce the radiation dose to as low   as reasonably achievable.   ??   COMPARISON:   None.   ??   HISTORY:   ORDERING SYSTEM PROVIDED HISTORY: elevated bp, headaches   TECHNOLOGIST PROVIDED HISTORY:   elevated bp, headaches   ??   Reason for Exam: Elevated BP, headache, fatigue   Acuity: Acute   Type of Exam: Initial   ??   FINDINGS:   BRAIN/VENTRICLES:   ??   The cerebral hemispheres, brainstem, and cerebellum have a normal appearance   for the patient's age. The falx is midline. The ventricles and peripheral   sulci are normal for the patient's age. ??There is no sign of a space   occupying lesion, infarction, or hemorrhage.   ??   Orbits: Portion of the orbits demonstrate no acute  abnormality.   ??   SINUSES: Mild mucoperiosteal thickening in the ethmoid sinuses. ??The   remaining imaged portions of the paranasal sinuses are clear. ??The mastoids   and the middle ear chambers are clear.   ??   SOFT TISSUES/SKULL: ??No acute abnormality of the visualized skull or soft   tissues.Vascular calcifications are seen compatible with atherosclerotic   disease.   ??   ??   No acute intracranial abnormalities are noted.   ??         MODIFIED BARIUM SWALLOW WAS PERFORMED IN CONJUNCTION WITH SPEECH PATHOLOGY   SERVICES   ??   TECHNIQUE:   Fluoroscopic evaluation of the swallowing mechanism was performed with   multiple consistency of barium product.   ??   FLUOROSCOPY DOSE AND TYPE OR TIME AND EXPOSURES:   0.9 minutes 1 image was obtained.   ??   COMPARISON:   None   ??   HISTORY:   ORDERING SYSTEM PROVIDED HISTORY: Productive cough   TECHNOLOGIST PROVIDED HISTORY:   frequent cough with regurgitation; feels triggered by almost any food or   lying down   Reason for Exam: Frequent cough; worse with laying down; 0.9 min fluoro time,   2.38 mGy air kerma   Acuity: Acute   Type of Exam: Initial   ??   FINDINGS:   No evidence of laryngeal penetration or aspiration.   ??   ??   Impression   No evidence of laryngeal penetration or aspiration.   ??   Please see separate speech pathology report for full discussion of findings   and recommendations.   ??       MRI OF THE LUMBAR SPINE WITHOUT CONTRAST, 01/01/2019 11:14 am   ??   TECHNIQUE:   Multiplanar multisequence MRI of the lumbar spine was performed without the   administration of intravenous contrast.   ??   COMPARISON:   CT 12/27/2018, 12/21/2018. ??MRI 12/13/2013.   ??   HISTORY:   ORDERING SYSTEM PROVIDED HISTORY: Compression fracture of L1 vertebra,   initial encounter [facility])   TECHNOLOGIST PROVIDED HISTORY:   fall on 12/11 with subsequent compression fracture of L1; still with   significant pain at midline lumbar spine and just R of spinous process   Reason for Exam: ??Patient  fell on 12/21/2018. Subsequent compression fracture   L1. Significant pain at midline lumbar spine and just right of spinous process   Acuity: Acute   Type of  Exam: Initial   Mechanism of Injury: Fall on 12/21/2018   Relevant Medical/Surgical History: Previous lumbar surgery.   ??   FINDINGS:   BONES/ALIGNMENT: Postsurgical changes status post L4-L5 posterior spinal   fusion and decompression. ??Minimal grade 1 anterolisthesis of L4 on L5   measures 3 mm.   ??   Acute/subacute L1 burst fracture with severe central vertebral body height   loss measuring greater than 80%. ??Retropulsion of fracture fragment into the   spinal canal by 6 mm. ??This is not significantly changed in appearance from   12/27/2018, but significantly worsened from 12/21/2018. ??Associated bone   marrow edema within L1.   ??   Remaining lumbar vertebral bodies are normal in height. ??Degenerative marrow   edema is seen about L5-S1. ??Remaining bone marrow is within normal limits.   ??   SPINAL CORD: The conus terminates normally.   ??   SOFT TISSUES: No paraspinal mass identified.   ??   L1-L2: Moderate spinal canal stenosis at L1 due to retropulsion of the   fracture fragments in the spinal canal. ??No focal disc herniation. ??No   significant neural foraminal stenosis.   ??   L2-L3: Minimal DDD. ??Annular bulging. ??Bilateral facet joint DJD and   ligamentum flavum thickening. ??No significant central spinal canal stenosis.   No significant neural foraminal stenosis.   ??   L3-L4: Mild DDD. ??Disc bulge eccentric to the right measures 5 mm. ??Bilateral   facet joint DJD and ligamentum flavum thickening. ??Mild spinal canal   stenosis. ??Right more than left lateral recess stenosis. ??Mild-to-moderate   bilateral neural foraminal stenosis.   ??   L4-L5: Mild DDD. ??The spinal canal is capacious due to laminectomy defects.   Mild bilateral neural foraminal stenosis, right more than left.   ??   L5-S1: Mild DDD. ??Disc bulge measures 3 mm. ??Bilateral facet joint DJD and    ligamentum flavum thickening. ??No significant central spinal canal stenosis.   Severe left and minimal right neural foraminal stenosis.   ??   ??   Impression   1. Acute/subacute L1 burst fracture, stable in appearance from 12/27/2018,   but significantly worsened from 12/21/2018. ??About 80% vertebral height loss   centrally and 6 mm retropulsion of fracture fragment into the spinal canal   contributing to moderate spinal canal stenosis.   2. Severe left L5-S1 neural foraminal stenosis.   3. Mild multifactorial L3-L4 spinal canal stenosis with mild-to-moderate   bilateral neural foraminal stenosis.   The findings were sent to the [facility] [place] at 1:28   pm on 12/22/2020to be communicated to a licensed caregiver.   ??         VISITING DIAGNOSIS:      ICD-10-CM    1. Tremors of nervous system  R25.1 TSH     Vitamin B12 & Folate     EEG     EMG     CT HEAD WO CONTRAST     VL DUP CAROTID BILATERAL     VL TRANSCRANIAL DOPPLER COMPLETE   2. Chronic low back pain with sciatica, sciatica laterality unspecified, unspecified back pain laterality  M54.40 TSH    G89.29 Vitamin B12 & Folate     EEG     EMG     CT HEAD WO CONTRAST     VL DUP CAROTID BILATERAL     VL TRANSCRANIAL DOPPLER COMPLETE   3. 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 Spinal stenosis of lumbar region without neurogenic claudication  M48.061 TSH     Vitamin B12 & Folate     EEG     EMG     CT HEAD WO CONTRAST     VL DUP CAROTID BILATERAL     VL TRANSCRANIAL DOPPLER COMPLETE   5. Gastro-esophageal reflux disease without esophagitis  K21.9 TSH     Vitamin B12 & Folate     EEG     EMG     CT HEAD WO CONTRAST     VL DUP CAROTID BILATERAL     VL TRANSCRANIAL DOPPLER COMPLETE   6. Closed compression fracture of body of L1 vertebra (HCC)  S32.010A TSH     Vitamin B12 & Folate     EEG     EMG      CT HEAD WO CONTRAST     VL DUP CAROTID BILATERAL     VL TRANSCRANIAL DOPPLER COMPLETE   7. Anxiety  F41.9 TSH     Vitamin B12 & Folate     EEG     EMG     CT HEAD WO CONTRAST     VL DUP CAROTID BILATERAL     VL TRANSCRANIAL DOPPLER COMPLETE   8. Benign essential tremor  G25.0 TSH     Vitamin B12 & Folate     EEG     EMG     CT HEAD WO CONTRAST     VL DUP CAROTID BILATERAL     VL TRANSCRANIAL DOPPLER COMPLETE   9. Dizziness  R42 TSH     Vitamin B12 & Folate     EEG     EMG     CT HEAD WO CONTRAST     VL DUP CAROTID BILATERAL     VL TRANSCRANIAL DOPPLER COMPLETE   10. Memory problem  R41.3 TSH     Vitamin B12 & Folate     EEG     EMG     CT HEAD WO CONTRAST     VL DUP CAROTID BILATERAL     VL TRANSCRANIAL DOPPLER COMPLETE   11. Balance problems  R26.89 TSH     Vitamin B12 & Folate     EEG     EMG     CT HEAD WO CONTRAST     VL DUP CAROTID BILATERAL     VL TRANSCRANIAL DOPPLER COMPLETE   12. Chronic cerebral ischemia  I67.82 TSH     Vitamin B12 & Folate     EEG     EMG     CT HEAD WO CONTRAST     VL DUP CAROTID BILATERAL     VL TRANSCRANIAL DOPPLER COMPLETE   13. Neck stiffness  M43.6 TSH     Vitamin B12 & Folate     EEG     EMG     CT HEAD WO CONTRAST     VL DUP CAROTID BILATERAL     VL TRANSCRANIAL DOPPLER COMPLETE   14. Tingling sensation  R20.2 TSH     Vitamin B12 & Folate     EEG     EMG     CT HEAD WO CONTRAST     VL DUP CAROTID BILATERAL  VL TRANSCRANIAL DOPPLER COMPLETE   15. Left arm weakness  R29.898 TSH     Vitamin B12 & Folate     EEG     EMG     CT HEAD WO CONTRAST     VL DUP CAROTID BILATERAL     VL TRANSCRANIAL DOPPLER COMPLETE   16. Dysphagia, unspecified type  R13.10 TSH     Vitamin B12 & Folate     EEG     EMG     CT HEAD WO CONTRAST     VL DUP CAROTID BILATERAL     VL TRANSCRANIAL DOPPLER COMPLETE   17. Brain dysfunction  G93.89 VL TRANSCRANIAL DOPPLER COMPLETE                CONCERNS   &   INCREASED   RISK   FOR        * TIA,  CEREBRO  VASCULAR  ISCHEMIA, STROKE     *   DIZZINESS,    VERTEBROBASILAR  INSUFFICIENCY ,          *   COGNITIVE  &   MEMORY PROBLEMS  AND  DEMENTIAS       *  MONONEUROPATHIES,   RADICULOPATHY         *    BALANCE PROBLMES   AND  FALL                VARIOUS  RISK   FACTORS   WERE  REVIEWED   AND   DISCUSSED.          *  PATIENT   HAS  MULTIPLE   MEDICAL, NEUROLOGICAL                        AND   MENTAL HEALTH   PROBLEMS           PATIENT'S   MANAGEMENT  IS  CHALLENGING.            PLAN:                         Petra Kuba  Of  The  Diagnoses,  The  Management & Treatment  Options            AND    Care  plan  Were          Reviewed and   Discussed   With  patient.           * Goals  And  Expectations  Of  The  Therapy  Discussed   And  Reviewed.          *   Benefits   And   Side  Effect  Profile  Of  Medication/s   Were   Discussed                * Need   For  Further   Follow up For  The  Various  Problems Were  discussed.          * Results  Of  The  Previous  Diagnostic tests were reviewed and  discussed                   Medical  Decision  Making  Was  Made  Based on the   Complexity  Of  Above  Mentioned  Diagnoses,    Data reviewed  And    Risk  Of  Significant   Co morbidities and   complicating   Factors.                 Medical  Decision  Was      High   Complexity   Due   To  The  Patient's  Multiple  Symptoms  &  Disease,            Complex  Treatment  Regimen,  Multiple medications           and   Risk  Of   Side  Effects,  Difficulty  In  Medication  Management  And  Diagnostic  Challenges           In  View  Of  The  Associated   Co  Morbid  Conditions   And  Problems.                           * FALL   PRECAUTIONS.      THESE  REVIEWED   AND  DISCUSSED      *  USE   WALKING  ASSISTANCE  DEVICES     QUAD  CANE        *   BE  CAREFUL  WITH  ACTIVITIES   INCLUDING  DRIVING.        *   AVOID   NECK  AND/ BACK  STRAINING  ACTIVITIES          *   ADEQUATE   FLUID  INTAKE   AND  ELECTROLYTE  BALANCE           * KEEP  DAIRY  OF   THE  NEUROLOGICAL  SYMPTOMS         RECORDING THE    DURATION  AND  FREQUENCY.          *  AVOID    CONDITIONS  AND  FACTORS   THAT  MAKE                  NEUROLOGICAL  SYMPTOMS  WORSE.                   *TO  MAINTAIN  REGULAR  SLEEP  WAKE  CYCLES.     *   TO  HAVE  ADEQUATE  REST  AND   SLEEP    HOURS.            *    AVOID  ANY USAGE OF    TOBACCO,          AVOID  EXCESSIVE  ALCOHOL  AND   ILLEGAL   SUBSTANCES          *  CONTINUE   MEDICATIONS    PRESCRIBED       AS    RECOMMENDED       *   Compliance   With  Medications   And  Instructions        *  May   Use  Pill  Box,    If  Needed      *    Prophylactic  Use   Of     Vitamin   B   Complex,  Folic  Acid,    Vitamin  B12    Multivitamin,       Calcium  With  magnesium  And  Vit D    Supplementations   Over  The  Counter  Discussed             *  PATIENT  IS  ALSO   COUNSELED   TO  KEEP    ACTIVITIES:         A)   SIMPLE      B)  ORGANIZED      C)  WRITEDOWN                     *  EVALUATIONS  AND  FOLLOW UP:                   * PHYSICAL  THERAPY                                 *CARDIOLOGY                                                         *   IN  VIEW  OF  THE  PATIENT'S    MULTIPLE   NEUROLOGICAL                           SYMPTOMS  AND  CONCERNS    FOR  PROLONGED   DURATION,                           AND    MULTIPLE   CO MORBID  MEDICAL  CONDITIONS,                           PATIENT    NEEDS  NEURO  DIAGNOSTIC  EVALUATIONS                      FOR   ANY   NEUROLOGICAL  PATHOLOGIES    AND  OTHER                        CORRECTABLE   ETIOLOGIES;     AND                              PATIENT  REQUESTS   THE  SAME.                             Controlled Substances Monitoring: Periodic Controlled Substance Monitoring: Possible medication side effects, risk of tolerance/dependence & alternative treatments discussed., Assessed functional status. Michel Bickers, MD)                      Orders Placed This Encounter   Procedures   ??? CT HEAD WO CONTRAST   ??? VL DUP CAROTID BILATERAL   ??? VL  TRANSCRANIAL DOPPLER COMPLETE   ??? TSH   ??? Vitamin B12 & Folate   ??? EEG   ??? EMG  Orders Placed This Encounter   Medications   ??? primidone (MYSOLINE) 50 MG tablet     Sig: 1/2   TABLET     AT  BED  TIME    FOR     6   DAYS,       THEN        ONE  TABLET   AT   BED  TIME     Dispense:  30 tablet     Refill:  1                             *  PATIENT  TO  RETURN  THE  CLINIC  AFTER   ABOVE,                       FOR   FURTHER    RE EVALUATIONS                               AND  ADDITIONAL  RECOMMENDATIONS                         *PATIENT   TO  FOLLOW  UP  WITH   PRIMARY  CARE         OTHER  CONSULTANTS  AS  BEFORE.               *TO  FOLLOW  WITH   MENTAL  HEALTH  PROFESSIONALS ,  INCLUDING            PSYCHOLOGICAL  COUNSELING   AND  PSYCHIATRIC  EVALUTIONS,                     *  Maintain   Healthy  Life Style    With   Periodic  Monitoring  Of          Any  Medical  Conditions  Including   Elevated  Blood  Pressure,  Lipid  Profile,        Blood  Sugar levels  AndHeart  Disease.                *   Period   Screening  For  Cancers  Involving  Breast,  Colon,         Lungs  And  Other  Organs  As  Applicable,  In consultation   With  Your  Primary Care Providers.               *Second  Neurological  Opinion  And  Evaluations  In  [facility]  Setting  If  Patient  Is  Interested.                 * Please   Contact   Neurology  Clinic   Early   If   Are  Any  New  Neurological   And  Any neurological  Concerns.                   *  If  The  Patient remains  Neurologically  Stable   Return   To  [facility] Neurology Department   IN      1-2  MONTHS  TIME   FOR  FURTHER              FOLLOW UP.                       *   The  Neurological   Findings,  Possible  Diagnosis,  Differential diagnoses                    And  Options  For    Further   Investigations                   And  management   Are  Discussed  Comprehensively.                    Medications   And   Prescription   Risks  And  Side effects  Are   Also  Discussed.                     *  If   There is  Any  Significant  Worsening   Of  Current  Symptoms  And  Or                  If patient  Develops   Any additional  New  NeurologicalSymptoms                  Or  Significant  Concerns   Should  Call  911 or  Go  To  Emergency  Department                  For  Further  Immediate  Evaluation and  management .                         The   Above  Were  Reviewed  With  PATIENT   and                         questions  Answered  In  Detail.                                              TOTAL   TIME     SPENT :               On this date 12/11/2019 I have spent  65 minutes     reviewing previous notes, test results               and face to face time with the patient including     discussing the diagnosis and importance of compliance               with the treatment plan  as well as documenting on the day of the visit.                                                            Electronically signed by   Michel Bickers,  M.D., Charlynn Grimes.     Board Certified in  Neurology &  In  [place] [facility] [employer] [facility] ([employer])      DISCLAIMER:   Although every effort was made to ensure the accuracy of this  electronictranscription, some errors in transcription may have occurred.      GENERAL PATIENT INSTRUCTIONS:     ??? A Healthy Lifestyle: Care Instructions  ??? Your Care Instructions  ??? A healthy lifestyle can help you feel good, stay at ahealthy weight, and have plenty of energy for both work and play. A healthy lifestyle is something you can share with your whole family.  ??? A healthy lifestyle also can lower your risk for serious health problems, such ashigh blood pressure, heart disease, and diabetes.  ??? You can follow a few steps listed below to improve your health and the health of your family.  ??? Follow-up careis a key part of your treatment and safety. Be sure to make and go to all  appointments, and call your doctor if you are having problems. It???s also a good idea to know your test results and keep a list of the medicines you take.  ??? How can you care for yourself at home?  ??? Do not eat too much sugar, fat, or fast foods. You can still have dessert and treats nowand then. The goal is moderation.  ??? Start small to improve your eating habits. Pay attention to portion sizes, drink less juice and soda pop, and eat more fruits and vegetables.  ??? Eat a healthy amount of food. A 3-ounce serving of meat, for example, is about the size of a deck of cards. Fill the rest of your plate with vegetables and whole grains.  ??? Limit theamount of soda and sports drinks you have every day. Drink more water when you are thirsty.  ??? Eat at least 5 servings of fruits and vegetables every day. It may seem like a lot, but it is not hard to reach this goal. Aserving or helping is 1 piece of fruit, 1 cup of vegetables, or 2 cups of leafy, raw vegetables. Have an apple or some carrot sticks as an afternoon snack instead of a candy bar. Try to have fruits and/or vegetables at everymeal.  ??? Make exercise part of your daily routine. You may want to start with simple activities, such as walking, bicycling, or slow swimming. Try tobe active 30 to 60 minutes every day. You do not need to do all 30 to 60 minutes all at once. For example, you can exercise 3 times a day for 10 or 20 minutes. Moderate exercise is safe for most people, but it is always agood idea to talk to your doctor before starting an exercise program.  ??? Keep moving. Mow the lawn, work in the garden, or [employer]. Take the stairs instead of the elevator at work.  ??? If you smoke, quit. Peoplewho smoke have an increased risk for heart attack, stroke, cancer, and other lung illnesses. Quitting is hard, but there are ways to boost your chance of quitting tobacco for good.  ??? Use nicotine gum, patches, or lozenges.  ??? Ask your doctor about stop-smoking  programs and medicines.  ??? Keep trying.  ??? In addition to reducing your risk of diseases in the future, you will notice some benefits soon after you stop using tobacco. If you have shortness of breath or asthma symptoms, they will likely getbetter within a few weeks after you quit.  ??? Limit how much  alcohol you drink. Moderate amounts of alcohol (up to 2 drinks a day for men, 1drink a day for women) are okay. But drinking too much can lead to liver problems, high blood pressure, and other health problems.  ??? health  ??? If you have a family, there are many things you can do together to improve your health.  ??? Eat meals together as a family as often as possible.  ??? Eat healthy foods. This includes fruits, vegetables, lean meats and dairy, and whole grains.  ??? Include your family in your fitness plan. Most peoplethink of activities such as jogging or tennis as the way to fitness, but there are many ways you and your family can be more active. Anything that makes you breathe hard and gets your heart pumping is exercise. Here are sometips:  ??? Walk to do errands or to take your child to school or the bus.  ??? Go for a family bike ride after dinner instead of watching TV.  ??? Where can you learn more?  ??? Go [email or web address] and sign in to your MyChart account. Enter [phone] in the Search HealthInformation box to learn more about "A Healthy Lifestyle: Care Instructions."     If you do not have anaccount, please click on the "Sign Up Now" link.  ??? Current as of: August 05, 2014  ??? Content Version: 11.2  ??? ?? 2006-2017 [employer]. Care instructions adapted under license by [facility]. If you have questions about a medical condition or this instruction, always ask your healthcare professional. [employer] disclaims any warranty or liability for your use of this information.

## 2019-12-12 LAB — VITAMIN B12 & FOLATE
Folate: >20 ng/mL (ref >4.8)
Vitamin B-12: 514 pg/mL (ref 232–1245)

## 2019-12-12 NOTE — Telephone Encounter (Signed)
Does the dosage need adjusted?

## 2019-12-12 NOTE — Telephone Encounter (Signed)
-----   Message from Riki Altes sent at 12/12/2019  3:39 PM EST -----  Subject: Message to Provider    QUESTIONS  Information for Provider? The patient is calling to let Doctor know that   the Citalopram is working well for her, but the extra half is a little too   much. Please call to advise  ---------------------------------------------------------------------------  --------------  CALL BACK INFO  What is the best way for the office to contact you? OK to leave message on   voicemail  Preferred Call Back Phone Number? [phone]  ---------------------------------------------------------------------------  --------------  SCRIPT ANSWERS  Relationship to Patient? Self

## 2019-12-13 NOTE — Telephone Encounter (Signed)
LMTRC

## 2019-12-13 NOTE — Telephone Encounter (Signed)
Patient called office back. Patient states that she just wanted to let dr know that just 1 day she seems tired after taking the celexa. Patient states she hasn't had another fatigue episode since. Writer informed patient to notify office if she would get any more fatigue episodes. Verbalized understanding.

## 2019-12-19 ENCOUNTER — Encounter

## 2019-12-19 NOTE — Telephone Encounter (Signed)
Patient requesting refill to loaded pharmacy.

## 2019-12-19 NOTE — Telephone Encounter (Signed)
-----   Message from Tally Joe sent at 12/19/2019  8:18 AM EST -----  Subject: Refill Request    QUESTIONS  Name of Medication? LORazepam (ATIVAN) 0.5 MG tablet  Patient-reported dosage and instructions? 1 TABET AT NIGHT  How many days do you have left? 0  Preferred Pharmacy? [facility]  Pharmacy phone number (if available)? [phone]  ---------------------------------------------------------------------------  --------------  CALL BACK INFO  What is the best way for the office to contact you? OK to leave message on   voicemail  Preferred Call Back Phone Number? [phone]

## 2019-12-24 ENCOUNTER — Inpatient Hospital Stay: Admit: 2019-12-24 | Payer: MEDICARE | Primary: Family Medicine

## 2019-12-24 DIAGNOSIS — M5440 Lumbago with sciatica, unspecified side: Secondary | ICD-10-CM

## 2019-12-24 NOTE — Procedures (Signed)
[facility]                 [place], [place] [address]                        EMG/NERVE CONDUCTION STUDIES REPORT      PATIENT NAME: Katie Juarez, Katie Juarez                   DOB:        [birth year]  MED REC NO:   [id number]                             ROOM:  ACCOUNT NO:   [id number]                           ADMIT DATE: 12/24/2019    PROVIDER:     Madelynn Done, MD    DATE OF EMG:  12/24/2019      PRIMARY CARE PROVIDER: [name], DO      TECHNICAL SUMMARY:  The nature, purpose, goals, expectations and process  involved in the procedures of nerve conduction studies and needle  electromyography were reviewed, discussed, explained and verbal consent  was obtained from the patient. The patient's questions were answered  with reference to the above processes and procedures.    There were no significant technical difficulties encountered during this  study and nerve conduction studies were performed under temperature  monitoring.      CLINICAL DATA:      The patient is 79 years old with complaints of weakness  involving the left upper extremity.  The patient also has shoulder pain.  The patient feels these symptoms are present for the last 6 months.  The  patient has numbness, tingling, paresthesias involving the hands.  The  patient has previous history of fall.    The purpose of the study is to evaluate for mononeuropathy,  radiculopathy, peripheral polyneuropathy.      SUMMARY:      The sensory nerve action potentials of the right and left  radial nerves are fairly within normal limits.    Sensory nerve action potentials of the right and left median nerve shows  normal amplitude and prolonged distal latencies.  Ulnar sensory nerve  action potentials appear fairly within normal limits.    Mixed nerve palmar potential shows increased median to ulnar distal  latency difference bilaterally.    Compound muscle action potentials of the right and left median nerve  shows  normal amplitude, normal distal latencies and borderline  conduction velocity on the left side.    Compound muscle action potentials of the left ulnar nerve shows normal  amplitude, distal latency, conduction velocities.  Compound muscle  action potentials of the right ulnar nerve shows normal amplitude at  wrist and mildly low amplitude above elbow with normal conduction  velocities.    Proximal conductions as measured by the F responses were normal, except  for nonrecordable right median F response.           Nerve  Conductions   Results  Were  Personally  Reviewed and  Analysed.   Abnormal  Nerve  Conductions  Were  Personally  Repeated,  Verified, reconfirmed  And  Updated as needed  appropriately.       Please    See  Wave  Forms   And    Details  Of     Nerve  Conduction   Studies   For  Additional  Information             Extensive   Needle  Electromyography  Was personally  Performed  In  Both      Upper  Extremities     In  The  Following  Muscles :    Deltoid,  Biceps  Brachii,  Triceps . Pronator  Teres,  Flexor Carpi Ulnaris,    Ext. Digitorum Communis,  FDI (Hand)         Extensive  Needle  Electromyography  Shows  No   Abnormality with :      A) Normal  insertional  Activity.      There  Is  Absence  Of   P  Waves,  Fibrillations,  Fasciculations and        Other  Spontaneous   Activity.     B) Voluntary  Motor unit  Potentials    Show :    Normal  Effort,  Recruitment, amplitude,  Duration.     No Polyphasia  Noted.              IMPRESSION:      1.  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      2.  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      3.  There is no definite electrodiagnostic evidence of lower cervical        radiculopathy or peripheral polyneuropathy involving the examined both        upper extremities.            Further clinical correlation and followup  recommended.            Madelynn Done, MD, [place]     Board Certified in Neurology  & in  [employer] (ABPN).              D: 12/24/2019 14:14:35       T: 12/24/2019 14:56:54     [email or web address]  Job#: [id number]     Doc#: [id number]    CC:  [name], DO

## 2019-12-24 NOTE — Progress Notes (Signed)
EMG/NCS Bilateral    uppers Completed    PCP: [name], DO    Ordering: Milus Glazier. Policherla, Neurologist    Interpreting Physician: Milus Glazier. Policherla, Neurologist    Technician: Santo Held, RCP, RRT

## 2019-12-26 ENCOUNTER — Encounter: Primary: Family Medicine

## 2019-12-27 ENCOUNTER — Inpatient Hospital Stay: Admit: 2019-12-27 | Payer: MEDICARE | Primary: Family Medicine

## 2019-12-27 DIAGNOSIS — M5440 Lumbago with sciatica, unspecified side: Secondary | ICD-10-CM

## 2019-12-27 NOTE — Procedures (Signed)
[facility] [place], [place] [address]                             Transcranial Doppler (TCD)      PATIENT NAME: Katie Juarez, Katie Juarez                   DOB:        1940/09/18  MED REC NO:   [id number]                             ROOM:  ACCOUNT NO:   [id number]                           ADMIT DATE: 12/27/2019    PROVIDER:     Madelynn Done, MD      DATE OF STUDY:  12/27/2019    TECHNIQUE:  Transcranial Doppler study of intracranial arteries was  performed using [employer] equipment with digital Doppler technology, with  high resolution 250 Gate M-mode display and Multigate Spectral Windows  and 2 MHz Doppler probes via temporal, suboccipital and orbital  approaches.    Transcranial Doppler study of the intracranial arteries was also  performed for emboli detection without intravenous microbubble injection  using continuous soundtrack, M-Mode with Multigate Spectral displays and  soundtrack displays with continuous bilateral monitoring.      CLINICAL DATA:      The patient is an 79 year old female with a history of  hyperlipidemia, dizziness, memory problem, gait difficulty, balance  problems, weakness in the left upper extremity, dysphagia.    The purpose of the study is to evaluate for stroke, intracranial focal  stenosis, flow abnormalities, vertebrobasilar insufficiency evaluations.      SUMMARY:      The mean flow velocities in the right middle cerebral artery  is low with elevated PI values.  Mean flow velocities in the left middle  cerebral artery appear within normal limits with elevated PI value.    Mean flow velocities in the right anterior and posterior cerebral  arteries are low with elevated PI values.  Mean flow velocities in the  left anterior cerebral artery is mildly hyperdynamic with a normal PI  value.  Mean flow velocities in the left posterior cerebral artery is  low with elevated PI value.    Mean flow velocities in the right and  left vertebral arteries, basilar  artery are within normal limits with elevated PI values.      TCD OF INTRACRANIAL ARTERIES FOR EMBOLI DETECTION:    Review and analysis of the waveforms and continuous soundtrack systems  during the current monitoring show no evidence of HITS (high intensity  transient signals) and no embolic shower events were detected.      IMPRESSION:      1.  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    2.  There is mild left anterior cerebral artery hyperdynamic flow.    3.  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    4.  No significant vertebrobasilar stenosis or insufficiency noted.    5.  TCD of intracranial arteries for emboli detection is negative.           Further clinical correlation  and followup recommended.          Madelynn Done, MD, [place]     Board Certified in Neurology  & in  [employer] (ABPN).           D: 12/30/2019 16:18:23       T: 12/30/2019 17:21:31     [email or web address]  Job#: [id number]     Doc#: [id number]    CC:  [name]

## 2019-12-27 NOTE — Progress Notes (Signed)
EXTENDED EEG Completed with Spike Analysis.    PCP: Karin K Black, DO    Ordering: Prasad N. Policherla, Neurologist    Interpreting Physician: Prasad N, Policherla, Neurologist    Technician: Esperansa Sarabia L Kera Deacon, RN

## 2019-12-27 NOTE — Procedures (Signed)
[facility]                 [place], [place] [address]                         ELECTROENCEPHALOGRAM (EEG) REPORT      PATIENT NAME: Katie Juarez, Katie Juarez                   DOB:        [birth year]  MED REC NO:   [id number]                             ROOM:  ACCOUNT NO:   [id number]                           ADMIT DATE: 12/27/2019    PROVIDER:     Madelynn Done, MD    DATE OF STUDY:  12/27/2019    TECHNIQUE:  23 channels of EEG, 2 channels of EOG, 2 channel of EKG and  1 channel ground and 1 channel reference were recorded with [employer]  Software 32 Channel System utilizing the International 10/20 System  Protocol.    Spike detection and seizure analysis protocols were utilized and the  study was reviewed using the comprehensive EEG monitoring.    This is an extended EEG recorded for 1 hour and 2 minutes.      CLINICAL DATA:      The patient is 79 years old with a history of anxiety,  dizziness, memory problem, tremors, chronic cerebral ischemia,  dysphagia.    The purpose of the study is to evaluate for associated seizure activity.    MEDICATIONS:  Celexa, Mysoline.      BACKGROUND ACTIVITY:      While the patient was awake, the background  activity consisted of fairly-regulated 10 Hz waveforms seen over both  cerebral hemispheres.    Intermittent brief frontal muscle artifacts noted.      ACTIVATION PROCEDURES:    HYPERVENTILATION:  Could not be performed due to the patient's clinical  condition.    PHOTIC STIMULATION:  Photic stimulation was performed at the following  frequencies: 1 Hz, 3 Hz, 6 Hz, 9 Hz, 12 Hz, 15 Hz, 18 Hz, 21 Hz, 25 Hz,  30 Hz and patient tolerated well.    Photic stimulation:  Mild bilateral symmetric driving response noted.    SLEEP:  Stage I sleep noted.    EKG:  EKG showed normal sinus rhythm without any significant  abnormality.      IMPRESSION:        No significant focal, lateralized or epileptiform features    noted during the current  recording.      Further clinical correlation and followup recommended.          Madelynn Done, MD, [place]     Board Certified in Neurology  & in  [place] [employer] (ABPN).           D: 12/30/2019 17:00:23       T: 12/30/2019 17:34:33     [id number]  Job#: [id number]     Doc#: [id number]    CC:  [name]

## 2019-12-27 NOTE — Progress Notes (Signed)
TCD Completed with Emboli Detection.    PCP: [name], DO    Ordering: Milus Glazier. Policherla, Neurologist    Interpreting Physician: Milus Glazier. Policherla, Neurologist    Electronically signed by Andria Meuse, RN on 12/27/2019 at 1:08 PM

## 2020-01-02 ENCOUNTER — Ambulatory Visit: Admit: 2020-01-02 | Discharge: 2020-01-02 | Payer: MEDICARE | Attending: Neurology | Primary: Family Medicine

## 2020-01-02 ENCOUNTER — Ambulatory Visit: Payer: MEDICARE | Attending: Neurology | Primary: Family Medicine

## 2020-01-02 DIAGNOSIS — G250 Essential tremor: Secondary | ICD-10-CM

## 2020-01-02 NOTE — Patient Instructions (Signed)
*   FALL   PRECAUTIONS.              *   ADEQUATE   FLUID  INTAKE   AND  ELECTROLYTE  BALANCE             * KEEP  DAIRY  OF   THE  NEUROLOGICAL  SYMPTOMS          *  TO  MAINTAIN  REGULAR  SLEEP  WAKE  CYCLES.     *   TO  HAVE  ADEQUATE  REST  AND   SLEEP    HOURS.          *    AVOID  USAGE OF   TOBACCO,  EXCESSIVE  ALCOHOL                AND   ILLEGAL   SUBSTANCES,  IF  ANY          *  Maintain   Healthy  Life Style    With   Periodic  Monitoring  Of         Any  Medical  Conditions  Including   Elevated  Blood  Pressure,  Lipid  Profile,       Blood  Sugar levels  And   Heart  Disease.                *   Period   Screening  For  Cancers  Involving  Breast,  Colon,         Lungs  And  Other  Organs  As  Applicable,           In consultation   With  Your  Primary Care Providers.                *  If   There is  Any  Significant  Worsening   Of  Current  Symptoms  And             Or  If    Any additional  New  Neurological  Symptoms  and          Significant  Concerns   Should  Call  911 or  Go  To  Emergency  Department            For  Further  Immediate  Evaluation.

## 2020-01-02 NOTE — Progress Notes (Signed)
[facility]  Neurology    [address] [place]  [address]  Phone:[phone]   Fax: [phone]        SUBJECTIVE:       PATIENT ID:  Katie Juarez is a  RIGHT   HANDED 79 y.o. female.      Neurologic Problem  The patient's primary symptoms include clumsiness, focal sensory loss, focal weakness, a loss of balance, memory loss and weakness. The patient's pertinent negatives include no altered mental status, near-syncope, slurred speech, syncope or visual change. Primary symptoms comment: TREMORS,    MILD  DYPHAGIA,  DIZZINESS. This is a chronic problem. The neurological problem developed gradually. The problem has been waxing and waning since onset. Associated symptoms include back pain, dizziness, light-headedness and neck pain. Pertinent negatives include no abdominal pain, auditory change, aura, bladder incontinence, bowel incontinence, chest pain, confusion, diaphoresis, fatigue, fever, headaches, nausea, palpitations, shortness of breath, vertigo or vomiting. Past treatments include bed rest and sleep. The treatment provided no relief. Her past medical history is significant for a bleeding disorder, dementia and mood changes. There is no history of a clotting disorder, a CVA, head trauma, liver disease or seizures.               History obtained from  The   PATIENT       AND      HER  DAUGHTER       and other  available   medical  records   were  Also  reviewed.          The  Duration,  Quality,  Severity,  Location,  Timing,  Context,  Modifying  Factors   Of   The   Chief   Complaint       And  Present  Illness  Was   Reviewed   In   Chronological   [place].                                              PATIENT'S  MAIN  CONCERNS INCLUDE :                       1)      H/O     PROGRESSIVE   WORSENING  OF    TREMORS                                    OF     HEAD ,  NECK,   BOTH   UPPER  ARMS                                           SINCE      April   2021                                       -     ESSENTIAL    TREMORS  2)       H/O     DIFFICULTY  WITH   WRITING,      EATING       DUE  TO                                            HER  TREMORS                          3)      H/O    CHRONIC   ANXIETY                                      WHICH   MAKES     HER  TREMORS    WORSE                                     -   ON     ATIVAN    AT  BED  TIME     AND    CELEXA                            4)      PREVIOUS     H/O     DEPRESSION                                  PREVIOUS    H/O     ELECTRO CONVULSIVE  THERAPY                                            MORE   THAN  50   YEARS  AGO                            5)       H/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                         6)      H/O   CHRONIC  MILD    INTERMITTENT  DIZZINESS                                          -    STABLE                      7)      H/O     CHRONIC  MILD  MEMORY  PROBLEMS                                   NO   CORRECTABLE   ETIOLOGIES  NOTED                       8)      H/O     CHRONIC   ADVANCED   CERVICAL   SPONDYLOSIS                              H/O      RIGHT  SIDED   NECK  STIFFNESS      AND                                          TINGLING                                          -   NEEDS  MONITORING                     9)       H/O     CHRONIC  MILD   BALANCE  PROBLEMS                        10)            PREVIOUS     FALLING    IN   DEC.   2020                              H/O   LUMBAR    SURGERY   IN     DEC.  2020                        11)      H/O      WEAKNESS    IN   LEFT  UPPER  EXTREMITY                                     SINCE       SEPT.   2021                               D  /   D     INCLUDE                                             REMOTE      STROKE                                   AND   CERVICAL  RADICULOPATHY                       12)      H/O     MILD     DYSPHAGIA        SINCE  MAY   2021  13)     CO  MORBID   MULTIPLE  MEDICAL  CONDITIONS                           BEING  FOLLOWED  BY    HER    PCP                         14)      H/O     CHRONIC    MILD     EXCESSIVE  BLEEDING                                          NOT  TAKING     ASA    80   MG                                                              15)       PATIENT     HAD       NEURO  DIAGNOSTIC  EVALUATIONS  IN  DEC. 2021                                     A)    CT  HEAD   SHOWED  NO  ACUTE    PATHOLOGY                                    B)     TSH,  B 12  FOLIC  ACID   LEVELS     ARE  WITH  IN NORMAL  LIMITS                                      C)    EEG  SHOWED  NO  SIGNIFICANT  ABNORMALITIES                                      D)    ASYMPTOMATIC      CAROTID  STENOSIS    LESS   THAN   50  %                                      E)     TCD      SHOWED   CHRONIC  CEREBRAL  ISCHEMIA                                           -   RESULTS    REVIEWED    AND    DISCUSSED     WITH  PATIENT  AND  HER   DAUGHTER                                             PATIENT    HAS    EXCESSIVE  BRUISING       AND                                              NOT  A  CANDIDATE     WITH    ANTI PLATELET  THERAPY                           16)     NATURE    AND   EXPECTATIONS,  PROGNOSIS  OF     ESSENTIAL   TREMORS                                 MANAGEMENT  OPTIONS     DISCUSSED       WITH      PATIENT                                     H/O     INTOLERANCE    TO    MYSOLINE   50   MG    1/2   TABLET       IN  DEC.  2021                                      AND   STOPPED    DUE   TO    SIDE  EFFECTS                                          I    DO   NOT  RECOMMEND      ANY   ADDITIONAL  MEDICATIONS                                       AS   THE  PATIENT    ALREADY  ON    ATIVAN  0.5  MG   AT   BED  TIME                                                               17)       MEDICATION   LIKE   ARICEPT      FOR  DEMENTIA      MANAGEMENT  DISCUSSED         WITH  PATIENT  AND  HER  DAUGHTER                                   PATIENT     WOULD  LIKE  TRY    THAT     AFTER  RETURNS   FROM                                         [place]     IN  April  2022                                   18)        PATIENT  ALSO   WILL  BE  BENEFIT      EMG  /  NC  STUDIES     EVALUATIONS                                        IN  FUTURE      AS   CLINICALLY     INDICATED.                                   19)       VARIOUS  RISK   FACTORS   WERE  REVIEWED   AND   DISCUSSED.                       PATIENT   HAS  MULTIPLE   MEDICAL, NEUROLOGICAL                        AND   MENTAL HEALTH   PROBLEMS                     PATIENT'S   MANAGEMENT  IS  CHALLENGING.                                        PRECIPITATING  FACTORS: including  fever/infection, exertion/relaxation, position change, stress,                weather change,   medications/alcohol, time of day/darkness/light  Are  absent                                                          MODIFYING  FACTORS:  fever/infection, exertion/relaxation, position change, stress, weather change,               medications/alcohol, time of day/darkness/light  Are  absent                Patient   Indicates   The  Presence   And  The  Absence  Of  The  Following    Associated  And             Additional  Neurological    Symptoms:                                Balance  And coordination   problems  present           Gait problems     present            Headaches      absent              Migraines           absent           Memory problemspresent              Confusion        absent            Paresthesia   numbness          present           Seizures  And  Starring  Episodes           absent           Syncope,  Near  syncopal episodes         absent           Speech   problems           absent             Swallowing   Problems      absent            Dizziness,  Light headedness            present              Vertigo        absent             Generalized   Weakness    absent              focal  Weakness     present             Tremors         present              Sleep  Problems     present             History  Of   Recent  Head  Injury     absent             History  Of   Recent  TIA     absent             History  Of   Recent    Stroke     absent             Neck  Pain   and   Neck muscle  Spasms  present               Radiating  down   And   Weakness           absent            Lower back   Pain  And     Spasms  present              Radiating  Down   And   Weakness          absent                H/OFALLS        present               History  Of   Visual  Symptoms    absent                  Associated   Diplopia       absent                                               Also   Additional   Symptoms   Present    As  Documented    In   The   detailed                  Review  Of  Systems   And    Please   Refer   To    Them for   Additional    Information.                    Any components  That are either  Unobtainable  Or  Limited  In   HPI, ROS  And/or PFSH   Are                   Due   ToPatient's  Medical  Problems,  Clinical  Condition   and/or lack of                                 other    Alternate   resources.            RECORDS   REVIEWED:    historical medical records           INFORMATION   REVIEWED:     MEDICAL   HISTORY,SURGICAL   HISTORY,     MEDICATIONS   LIST,   ALLERGIES AND  DRUG  INTOLERANCES,       FAMILY   HISTORY,  SOCIAL  HISTORY,      PROBLEM  LIST   FOR  PATIENT  CARE   COORDINATION          Past Medical History:   Diagnosis Date   ??? 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 Breast cancer ([place]) 1999    in remission   ??? Constipation 12/27/2018   ??? COPD (chronic obstructive pulmonary disease) ([place])    ??? DDD (degenerative disc disease)    ??? Large bowel obstruction ([place]) 02/2015    [facility]   ??? LBP (low back pain)    ??? Pharyngoesophageal  dysphagia    ??? SS (spinal stenosis)          Past Surgical History:   Procedure Laterality Date   ??? APPENDECTOMY  1956   ??? BLADDER SUSPENSION  1990   ??? BREAST LUMPECTOMY Right 1999   ??? CHOLECYSTECTOMY  1990   ??? EXCISION/BIOPSY Left 11/01/2018    Dr Yehuda Savannah / [name], for [facility]   ??? FEMORAL HERNIA REPAIR Left 02/20/2015    strangulated with  small bowel resection - [facility], [id number]   ??? HYSTERECTOMY  [phone]    Done for endometriosis; done in [place]   ??? LUMBAR SPINE SURGERY  01/03/2019    VERTEBRAL AUMENTATION  L1     ??? OTHER SURGICAL HISTORY  11/14/13, 06/23/11, 06/30/11, 07/12/11 12/29/11    L4/L5 IESI   ??? OTHER SURGICAL HISTORY  12/17/2013    L5/S1 IESI   ??? OTHER SURGICAL HISTORY Bilateral 05/30/2014    SIJ and piriformis   ??? OTHER SURGICAL HISTORY Bilateral 09/16/2014    L3, L4, L5 Diagnostic Medial Branch Block   ??? OTHER SURGICAL HISTORY Bilateral 09/26/2014    L5 TFE   ??? OTHER SURGICAL HISTORY  10/10/2014    bil L5 TFE   ??? OTHER SURGICAL HISTORY  10/31/2014    caudal   ??? SMALL INTESTINE SURGERY  02/20/2015    strangulated left femoral hernia   ??? SPINE SURGERY N/A 01/03/2019    VERTEBRAL AUMENTATION  L1  (C-ARM X2, performed by Radonna Ricker, MD at [facility] OR   ??? TONSILLECTOMY  1946   ??? UPPER GASTROINTESTINAL ENDOSCOPY N/A 04/24/2019    EGD BIOPSY performed by Durene Fruits, MD at [facility] OR         Current Outpatient Medications   Medication Sig Dispense Refill   ??? LORazepam (ATIVAN) 0.5 MG tablet Take 1 tablet by mouth nightly as needed for Anxiety for up to 90 days. 90 tablet 0   ??? citalopram (CELEXA) 20 MG tablet Take 1.5 tablets by mouth daily 135 tablet 1   ??? CRANBERRY PO Take by mouth     ??? Cholecalciferol (VITAMIN D3) 50 MCG (2000 UT) CAPS Take by mouth     ??? Handicap Placard MISC by Does not apply route Issue parking placard for person with disability; Applicant meets the qualifying disability criteria. Length of time expected to have disability.  Prescription expires in 5 years  from issuing date. 1 each 0   ??? budesonide-formoterol (SYMBICORT) 160-4.5 MCG/ACT AERO Inhale 2 puffs into the lungs 2 times daily Rinse mouth or brush teeth after each use. 3 Inhaler 3   ??? alendronate (FOSAMAX) 70 MG tablet Take 1 tablet by mouth every 7 days 12 tablet 3   ??? CALCIUM CARBONATE-VITAMIN D PO Take 600 mg by mouth 2 times daily Contains 600 mg Calcium & 800 IU Vitamin D3.     ??? Multiple Vitamins-Minerals (THERAPEUTIC MULTIVITAMIN-MINERALS) tablet Take 1 tablet by mouth daily     ??? albuterol sulfate HFA (VENTOLIN HFA) 108 (90 Base) MCG/ACT inhaler Ventolin HFA 90 mcg/actuation aerosol inhaler   Inhale 2 puffs every 6 hours by inhalation route as needed.   ASTHMA     ??? polyvinyl alcohol-povidone (HYPOTEARS) 1.4-0.6 % ophthalmic solution Place 1-2 drops into both eyes as needed.     ??? aspirin 81 MG EC tablet Take 81 mg by mouth daily Indications: takes irregularly  (Patient not taking: Reported on 01/02/2020)     ??? docusate sodium (COLACE) 100 MG capsule Take 100 mg by mouth nightly  (Patient not taking: Reported on 01/02/2020)       No current facility-administered medications for this visit.         Allergies   Allergen Reactions   ??? Morphine Other (See Comments)     Night sweats,felt "loopy".   ??? Pcn [Penicillins] Hives   ??? Zithromax [Azithromycin] Hives   ??? Adhesive Tape Rash  Family History   Problem Relation Age of Onset   ??? Other Mother         ALS - diagnosed at age 81   ??? Stroke Father         age 45   ??? Cancer Sister         pt thinks it was liver   ??? Cancer Brother         leukemia   ??? Diabetes Maternal Grandfather    ??? Other Brother         drowned   ??? Other Brother         died at age 75; think due to spinal meningitis   ??? Lupus Sister    ??? Alzheimer's Disease Sister    ??? Dementia Sister    ??? Mult Sclerosis Nephew    ??? Mult Sclerosis Daughter          Social History     Socioeconomic History   ??? Marital status: Widowed     Spouse name: Not on file   ??? Number of children: Not on file   ???  Years of education: Not on file   ??? Highest education level: Not on file   Occupational History   ??? Not on file   Tobacco Use   ??? Smoking status: Former Smoker     Packs/day: 1.00     Years: 40.00     Pack years: 40.00     Start date: 01/10/1953     Quit date: 01/11/1988     Years since quitting: 31.9   ??? Smokeless tobacco: Never Used   ??? Tobacco comment: quit 25 years ago 1990   Vaping Use   ??? Vaping Use: Never used   Substance and Sexual Activity   ??? Alcohol use: No     Alcohol/week: 0.0 standard drinks   ??? Drug use: Never   ??? Sexual activity: Not on file   Other Topics Concern   ??? Not on file   Social History Narrative   ??? Not on file     Social Determinants of Health     Financial Resource Strain:    ??? Difficulty of Paying Living Expenses: Not on file   Food Insecurity:    ??? Worried About Running Out of Food in the Last Year: Not on file   ??? Ran Out of Food in the Last Year: Not on file   Transportation Needs:    ??? Lack of Transportation (Medical): Not on file   ??? Lack of Transportation (Non-Medical): Not on file   Physical Activity:    ??? Days of Exercise per Week: Not on file   ??? Minutes of Exercise per Session: Not on file   Stress:    ??? Feeling of Stress : Not on file   Social Connections:    ??? Frequency of Communication with Friends and Family: Not on file   ??? Frequency of Social Gatherings with Friends and Family: Not on file   ??? Attends Religious Services: Not on file   ??? Active Member of Clubs or Organizations: Not on file   ??? Attends Banker Meetings: Not on file   ??? Marital Status: Not on file   Intimate Partner Violence:    ??? Fear of Current or Ex-Partner: Not on file   ??? Emotionally Abused: Not on file   ??? Physically Abused: Not on file   ??? Sexually Abused: Not on file   Housing  Stability:    ??? Unable to Pay for Housing in the Last Year: Not on file   ??? Number of Places Lived in the Last Year: Not on file   ??? Unstable Housing in the Last Year: Not on file       Vitals:    01/02/20 1037   BP:  118/72   Pulse: 59   SpO2: 95%         Wt Readings from Last 3 Encounters:   01/02/20 134 lb 3.2 oz (60.9 kg)   12/11/19 136 lb (61.7 kg)   12/04/19 135 lb 9.6 oz (61.5 kg)         BP Readings from Last 3 Encounters:   01/02/20 118/72   12/11/19 122/80   12/04/19 124/80           Hematology and Coagulation    Lab Results   Component Value Date    WBC 10.6 10/17/2019    WBC 10.6 05/20/2019    RBC 4.71 10/17/2019    HGB 14.6 10/17/2019    HCT 41.9 10/17/2019    MCV 88.9 10/17/2019    MCH 31.0 10/17/2019    MCHC 34.9 10/17/2019    RDW 12.8 10/17/2019    PLT 203.2 10/17/2019    MPV 10.5 05/20/2019           Chemistries    Lab Results   Component Value Date    NA 141 11/05/2019    K 4.5 11/05/2019    CL 105 11/05/2019    CO2 27 11/05/2019    BUN 17 11/05/2019    CREATININE 0.79 11/05/2019    CALCIUM 9.2 11/05/2019    PROT 6.8 11/05/2019    LABALBU 4.1 11/05/2019    LABALBU 4.4 10/17/2019    BILITOT 0.43 11/05/2019    ALKPHOS 58 11/05/2019    AST 22 11/05/2019    ALT 16 11/05/2019     Lab Results   Component Value Date    ALKPHOS 58 11/05/2019    ALT 16 11/05/2019    AST 22 11/05/2019    PROT 6.8 11/05/2019    BILITOT 0.43 11/05/2019    LABALBU 4.1 11/05/2019    LABALBU 4.4 10/17/2019     Lab Results   Component Value Date    BUN 17 11/05/2019    CREATININE 0.79 11/05/2019     Lab Results   Component Value Date    CALCIUM 9.2 11/05/2019    MG 1.8 01/13/2019     Lab Results   Component Value Date    AST 22 11/05/2019    ALT 16 11/05/2019           Review of Systems   Constitutional: Negative for appetite change, chills, diaphoresis, fatigue, fever and unexpected weight change.   HENT: Negative for congestion, dental problem, drooling, ear discharge, ear pain, facial swelling, hearing loss, mouth sores, nosebleeds, postnasal drip, sinus pressure, sore throat, tinnitus, trouble swallowing and voice change.    Eyes: Negative for photophobia, pain, discharge, redness, itching and visual disturbance.   Respiratory: Negative for  apnea, cough, choking, chest tightness, shortness of breath and wheezing.    Cardiovascular: Negative for chest pain, palpitations, leg swelling and near-syncope.   Gastrointestinal: Negative for abdominal distention, abdominal pain, blood in stool, bowel incontinence, constipation, diarrhea, nausea and vomiting.   Endocrine: Negative for cold intolerance, heat intolerance, polydipsia, polyphagia and polyuria.   Genitourinary: Negative for bladder incontinence.   Musculoskeletal: Positive for back pain and neck  pain. Negative for arthralgias, gait problem, joint swelling, myalgias and neck stiffness.   Skin: Negative for color change, pallor, rash and wound.   Allergic/Immunologic: Negative for environmental allergies, food allergies and immunocompromised state.   Neurological: Positive for dizziness, tremors, focal weakness, weakness, light-headedness, numbness and loss of balance. Negative for vertigo, seizures, syncope, facial asymmetry, speech difficulty and headaches.   Hematological: Negative for adenopathy. Does not bruise/bleed easily.   Psychiatric/Behavioral: Positive for decreased concentration, memory loss and sleep disturbance. Negative for agitation, behavioral problems, confusion, dysphoric mood, hallucinations, self-injury and suicidal ideas. The patient is nervous/anxious. The patient is not hyperactive.          OBJECTIVE:      Physical Exam  Constitutional:       Appearance: She is well-developed.   HENT:      Head: Normocephalic and atraumatic. No raccoon eyes or Battle's sign.      Right Ear: External ear normal.      Left Ear: External ear normal.      Nose: Nose normal.   Eyes:      Conjunctiva/sclera: Conjunctivae normal.      Pupils: Pupils are equal, round, and reactive to light.   Neck:      Thyroid: No thyroid mass or thyromegaly.      Vascular: No carotid bruit.      Trachea: No tracheal deviation.      Meningeal: Brudzinski's sign and Kernig's sign absent.   Cardiovascular:      Rate  and Rhythm: Normal rate and regular rhythm.   Pulmonary:      Effort: Pulmonary effort is normal.   Musculoskeletal:         General: No tenderness.      Cervical back: Normal range of motion and neck supple. No rigidity. No muscular tenderness. Normal range of motion.   Skin:     General: Skin is warm.      Coloration: Skin is not pale.      Findings: No erythema or rash.      Nails: There is no clubbing.   Psychiatric:         Attention and Perception: She is attentive.         Mood and Affect: Mood is anxious. Mood is not depressed. Affect is not labile, blunt or inappropriate.         Speech: She is communicative. Speech is delayed. Speech is not rapid and pressured, slurred or tangential.         Behavior: Behavior is slowed. Behavior is not agitated, aggressive, withdrawn, hyperactive or combative. Behavior is cooperative.         Thought Content: Thought content is not paranoid or delusional. Thought content does not include homicidal or suicidal ideation. Thought content does not include homicidal or suicidal plan.         Cognition and Memory: Cognition is impaired. Memory is impaired. She exhibits impaired recent memory. She does not exhibit impaired remote memory.         Judgment: Judgment is not impulsive or inappropriate.             NEUROLOGICALEXAMINATION :      A) MENTAL STATUS:                   Alert and  oriented  To time, place  And  Person.                  No Aphasia.  No  Dysarthria.                    Able   To  Follow     SIMPLE    commands   without   Any  Difficulty.                   No right  To left confusion.                  Normal  Speech  And language function.                   Insight and  Judgment ,Fund  Of  Knowledge   within normal limits                Recent  And  Remote memory  DECREASED                  Attention &  Concentration are    DECREASED                         B) CRANIAL NERVES :        CN : Visual  Acuity  And  Visual fields  within normal limits                Fundi  Could  Not  Be  Could  Not  Be  Evaluated.           3,4,6 CN : Both  Pupils are   Reactive and  Equal.  Movements  Are  Intact.                             No  Nystagmus.  No  INO.  No  Afferent  Pupillary  Defect noted.          5 CN :  Normal  Facial sensations and Corneal  Reflexes           7 CN:  Normal  Facial  Symmetry  And  Strength.  No facial  Weakness.           8 CN :  Hearing  Appears    DECREASED          9, 10 CN: Normal   spontaneous, reflex   palate   movements         11 CN:   Normal  Shoulder  shrug and  strength         12 CN :   Normal  Tongue movements and  Tongue  In midline                        No tongue   Fasciculations or atrophy       C) MOTOR  EXAM:                 Strength  In upper  And  Lower   extremities     4 + /5                 No  Drift.     No  Atrophy               Rapid   alternating  And  repetitions  Movements    DECREASED  Muscle  Tone  In upper  And  Lower  Extremities  normal                No rigidity.  No  Spasticity.                 Bradykinesia   absent                 No  Asterixis.                SIGNIFICANT    TREMORS   BOTH    HANDS,   HEAD  &  NECK                       NO   Resting   Tremor   absent                    No   other  Abnormal  Movements noted           D) SENSORY :               Light   touch, pinprick,   position  And  Vibration   DECREASED        E) REFLEXES:                   Deep  Tendon  Reflexes   Abnormal                F) COORDINATION  AND  GAIT :                                Station and  Gait    SLOW                             Romberg 's test    POSITIVE                            Ataxia negative        ASSESSMENT:      Patient Active Problem List   Diagnosis   ??? Lumbar facet joint syndrome (HCC)   ??? 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 Pain of both sacroiliac joints   ??? Piriformis syndrome of left side   ??? Lumbar radicular pain   ???  Neural foraminal stenosis of lumbar spine   ??? Chronic low back pain   ??? Spinal stenosis, lumbar   ??? Diverticulitis   ??? Acquired absence of other specified parts of digestive tract   ??? Arthrodesis status   ??? Asymptomatic menopausal state   ??? Chronic obstructive pulmonary disease, unspecified (HCC)   ??? Extravasation of urine   ??? 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 Hyperlipidemia, unspecified   ??? Closed compression fracture of body of L1 vertebra (HCC)   ??? Lumbar burst fracture, open, initial encounter (HCC)   ??? Pharyngoesophageal dysphagia   ??? Anxiety   ??? Tremors of nervous system   ??? Benign essential tremor   ??? Dizziness   ??? Memory problem   ??? Balance problems   ??? Chronic cerebral ischemia   ??? Neck stiffness   ??? Tingling sensation   ??? Left arm weakness   ???  Dysphagia   ??? Brain dysfunction   ??? Carotid stenosis, asymptomatic, bilateral             CT OF THE HEAD WITHOUT CONTRAST ??12/24/2019 1:23 pm   ??   TECHNIQUE:   CT of the head was performed without the administration of intravenous   contrast. Dose modulation, iterative reconstruction, and/or weight based   adjustment of the mA/kV was utilized to reduce the radiation dose to as low   as reasonably achievable.   ??   COMPARISON:   01/13/2019   ??   HISTORY:   ORDERING SYSTEM PROVIDED HISTORY: Chronic low back pain with sciatica,   sciatica laterality unspecified, unspecified back pain laterality   TECHNOLOGIST PROVIDED HISTORY:   Reason for Exam: C/o dizziness, headache   ??   FINDINGS:   BRAIN/VENTRICLES: There is no acute intracranial hemorrhage, mass effect or   midline shift. ??No abnormal extra-axial fluid collection. ??The gray-white   differentiation is maintained without evidence of an acute infarct. ??There is   no evidence of hydrocephalus.   ??   Mild cerebral atrophy is identified.   ??   ORBITS: The visualized portion of the orbits demonstrate no acute abnormality.   ??   SINUSES: The visualized paranasal sinuses and mastoid air cells  demonstrate   no acute abnormality.   ??   SOFT TISSUES/SKULL: ??No acute abnormality of the visualized skull or soft   tissues.   ??   ??   Impression   No acute intracranial abnormality.   ??       ULTRASOUND EVALUATION OF THE CAROTID ARTERIES   ??   12/24/2019   ??   TECHNIQUE:   Duplex ultrasound using B-mode/gray scaled imaging, Doppler spectral analysis   and color flow Doppler was obtained of the carotid arteries.   ??   COMPARISON:   None.   ??   HISTORY:   ORDERING SYSTEM PROVIDED HISTORY: Chronic low back pain with sciatica,   sciatica laterality unspecified, unspecified back pain laterality   ??   Dizziness   ??   FINDINGS:   ??   RIGHT:   ??   The right common carotid artery demonstrates peak systolic velocities of 65   and 60 cm/sec in the proximal and distal segments respectively.   ??   The right internal carotid artery demonstrates the systolic velocities of 32,   42, and 120 cm/sec in the proximal, mid and distal segments respectively.   ??   The right external carotid artery is patent. ??The right vertebral artery   demonstrates retrograde flow suggesting a right subclavian stenosis.   ??   Mild plaque at bifurcation with measured stenosis of 20% in the proximal ICA.   ??   ICA/CCA ratio of 0.4   ??   ??   LEFT:   ??   The left common carotid artery demonstrates peak systolic velocities of 72   and 60 cm/sec in the proximal and distal segments respectively.   ??   The left internal carotid artery demonstrates the systolic velocities of 60,   45, and 106 cm/sec in the proximal, mid and distal segments respectively.   ??   The left external carotid artery is patent. ??The left vertebral artery   demonstrates normal antegrade flow.   ??   Moderate heterogeneous hard plaque at the bifurcation with measured stenosis   of up to 54% in the proximal ICA, with no corresponding flow velocity   acceleration.   ??  ICA/CCA ratio of 1.1   ??   ??   Impression   The right internal carotid artery demonstrates 0-50% stenosis.   ??   The left  internal carotid artery demonstrates 0-50% stenosis. ??Hard plaque at   the bifurcation with measured stenosis of up to 54% without corresponding   flow velocity acceleration.   ??   Retrograde flow in the right vertebral artery. ??Antegrade flow in the left   vertebral artery.   ??   Consider CTA for further evaluation.         ?? ?? ?? ?? ?? ?? ?? ??[facility] ??   ???? ?? ?? ?? ?? ?? ?? ?? ?? ?? ?? ?? ?? ?? ?? ?? ?? ?? ?? [id number] ??   ???? ?? ?? ?? ?? ?? ?? ?? ?? ?? ?? ?? ?? ?? ?? ?? ?? ?? ?? ?? [place], [place] [id number] ??   ???? ?? ?? ?? ?? ?? ?? ?? ?? ?? ?? ?? ?? ?? ?? ?? ?? ?? ?? ?? ?? ?? ?? ?? ?? ??   ???? ?? ?? ?? ?? ?? ?? ?? ?? ?? ?? ?? ?? ?? XRay Report / XR cervical sp 3V LAT/AP/ODON ??   ???? ?? ?? ?? ?? ?? ?? ?? ?? ?? ?? ?? ?? ?? ?? ?? ?? ?? ?? ?? ?? ?? ?? Signed ??   ????   Patient: Treanor,Ireene V ?? ?? ?? ?? ?? ?? ?? ?? ?? ?? ?? ?? ?? ?? ?? ?? ?? ?? ?? ?? ?? ?? ?? ?? ?? ?? ?? ?? ?? ?? ?? ??MR#: [id number] ??   [id number] ?? ?? ?? ?? ??   DOB: 1940/03/20 ?? ?? ?? ?? ?? ?? ?? ?? ?? ?? ?? ?? ?? ?? ?? ?? ?? ?? ?? ?? ?? ?? ?? ?? ?? ?? ?? ?? ?? ?? ?? ??[id number] ?? ?? ??   ???? ?? ??   Age/Sex: 79 / F ?? ?? ?? ?? ?? ?? ?? ?? ?? ?? ?? ?? ?? ?? ?? ?? ?? ?? ?? ?? ?? ?? ?? ?? ?? ?? ?? ?? ?? ?? ?? ??ADM Date: 10/17/19 ?? ??    ???? ?? ??    Loc: LAB ?? ?? ?? ?? ?? ?? ?? ?? ?? ?? ?? ?? ?? ?? ?? ?? ?? ?? ?? ?? ?? ?? ?? ?? ?? ?? ?? ?? ?? ?? ?? ?? ?? ?? ?? ?? ?? ?? ?? ?? ?? ??   Attending Dr: Lorenda Peck PA ??   Primary Care Dr. Freddy Jaksch [facility] DO ??   ????   ????   Ordering Physician: Lorenda Peck PA ??   Date of Service: 10/17/19 ??   Procedure(s): XR cervical sp 3V LAT/AP/ODON ??   Accession Number(s): [id number] ??   ????   cc: Black,Karin K DO; Lorenda Peck PA ??    ????   ????   TECHNIQUE/VIEWS: ?? 3 views ??    ????   CLINICAL HISTORY: ?? 79 year-old female with left arm weakness. ??No injury. ??   ????   COMPARISON: ??No relevant prior studies available ??   ????   FINDINGS: ?? ??   ????   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 ?? ??   ????   There is straightening of the normal cervical lordosis. ??   ????   No definite fractures or  subluxations. ??   ????   No prevertebral soft  tissue swelling. ??    ????   IMPRESSION: ??   1. ??Advanced degenerative spondylosis. ??   ????   ????   ????   Dictated By: ?? ?? ?? ??Ewonus,John DO ?? ?? ?? ?? ?? ?? ?? ?? ?? ?? ?? ?? ?? ?? ?? ?? ?? ?? ?? ?? ?? ?? ?? ??   Signed By: ?? ?? ?? ?? ??<Electronically signed by Doreene Nest, DO> ?? ?? ?? ?? ?? ?? ?? ?? ?? ?? ?? ?? ?? ?? ?? ?? ?? ?? ?? ??   ????10/18/19 0812 ??   ???? ?? ?? ?? ?? ?? ?? ?? ?? ?? ?? ?? ?? ?? ?? ?? ?? ?? ?? ?? ?? ?? ?? ?? ?? ?? ?? ?? ?? ?? ?? ?? ?? ?? ?? ?? ?? ?? ?? ?? ??   ???? ?? ?? ?? ?? ?? ?? ?? ?? ?? ?? ?? ?? ?? ?? ?? ?? ?? ?? ?? ?? ?? ?? ?? ?? ?? ?? ??        CT OF THE HEAD WITHOUT CONTRAST ??01/13/2019 3:18 pm   ??   TECHNIQUE:   CT of the head was performed without the administration of intravenous   contrast. Dose modulation, iterative reconstruction, and/or weight based   adjustment of the mA/kV was utilized to reduce the radiation dose to as low   as reasonably achievable.   ??   COMPARISON:   None.   ??   HISTORY:   ORDERING SYSTEM PROVIDED HISTORY: elevated bp, headaches   TECHNOLOGIST PROVIDED HISTORY:   elevated bp, headaches   ??   Reason for Exam: Elevated BP, headache, fatigue   Acuity: Acute   Type of Exam: Initial   ??   FINDINGS:   BRAIN/VENTRICLES:   ??   The cerebral hemispheres, brainstem, and cerebellum have a normal appearance   for the patient's age. The falx is midline. The ventricles and peripheral   sulci are normal for the patient's age. ??There is no sign of a space   occupying lesion, infarction, or hemorrhage.   ??   Orbits: Portion of the orbits demonstrate no acute abnormality.   ??   SINUSES: Mild mucoperiosteal thickening in the ethmoid sinuses. ??The   remaining imaged portions of the paranasal sinuses are clear. ??The mastoids   and the middle ear chambers are clear.   ??   SOFT TISSUES/SKULL: ??No acute abnormality of the visualized skull or soft   tissues.Vascular calcifications are seen compatible with atherosclerotic   disease.   ??   ??   No acute intracranial abnormalities are noted.   ??         MODIFIED BARIUM SWALLOW WAS PERFORMED IN CONJUNCTION WITH SPEECH PATHOLOGY   SERVICES   ??   TECHNIQUE:    Fluoroscopic evaluation of the swallowing mechanism was performed with   multiple consistency of barium product.   ??   FLUOROSCOPY DOSE AND TYPE OR TIME AND EXPOSURES:   0.9 minutes 1 image was obtained.   ??   COMPARISON:   None   ??   HISTORY:   ORDERING SYSTEM PROVIDED HISTORY: Productive cough   TECHNOLOGIST PROVIDED HISTORY:   frequent cough with regurgitation; feels triggered by almost any food or   lying down   Reason for Exam: Frequent cough; worse with laying down; 0.9 min fluoro time,   2.38 mGy air kerma   Acuity: Acute  Type of Exam: Initial   ??   FINDINGS:   No evidence of laryngeal penetration or aspiration.   ??   ??   Impression   No evidence of laryngeal penetration or aspiration.   ??   Please see separate speech pathology report for full discussion of findings   and recommendations.   ??       MRI OF THE LUMBAR SPINE WITHOUT CONTRAST, 01/01/2019 11:14 am   ??   TECHNIQUE:   Multiplanar multisequence MRI of the lumbar spine was performed without the   administration of intravenous contrast.   ??   COMPARISON:   CT 12/27/2018, 12/21/2018. ??MRI 12/13/2013.   ??   HISTORY:   ORDERING SYSTEM PROVIDED HISTORY: Compression fracture of L1 vertebra,   initial encounter [facility])   TECHNOLOGIST PROVIDED HISTORY:   fall on 12/11 with subsequent compression fracture of L1; still with   significant pain at midline lumbar spine and just R of spinous process   Reason for Exam: ??Patient fell on 12/21/2018. Subsequent compression fracture   L1. Significant pain at midline lumbar spine and just right of spinous process   Acuity: Acute   Type of Exam: Initial   Mechanism of Injury: Fall on 12/21/2018   Relevant Medical/Surgical History: Previous lumbar surgery.   ??   FINDINGS:   BONES/ALIGNMENT: Postsurgical changes status post L4-L5 posterior spinal   fusion and decompression. ??Minimal grade 1 anterolisthesis of L4 on L5   measures 3 mm.   ??   Acute/subacute L1 burst fracture with severe central vertebral body height   loss  measuring greater than 80%. ??Retropulsion of fracture fragment into the   spinal canal by 6 mm. ??This is not significantly changed in appearance from   12/27/2018, but significantly worsened from 12/21/2018. ??Associated bone   marrow edema within L1.   ??   Remaining lumbar vertebral bodies are normal in height. ??Degenerative marrow   edema is seen about L5-S1. ??Remaining bone marrow is within normal limits.   ??   SPINAL CORD: The conus terminates normally.   ??   SOFT TISSUES: No paraspinal mass identified.   ??   L1-L2: Moderate spinal canal stenosis at L1 due to retropulsion of the   fracture fragments in the spinal canal. ??No focal disc herniation. ??No   significant neural foraminal stenosis.   ??   L2-L3: Minimal DDD. ??Annular bulging. ??Bilateral facet joint DJD and   ligamentum flavum thickening. ??No significant central spinal canal stenosis.   No significant neural foraminal stenosis.   ??   L3-L4: Mild DDD. ??Disc bulge eccentric to the right measures 5 mm. ??Bilateral   facet joint DJD and ligamentum flavum thickening. ??Mild spinal canal   stenosis. ??Right more than left lateral recess stenosis. ??Mild-to-moderate   bilateral neural foraminal stenosis.   ??   L4-L5: Mild DDD. ??The spinal canal is capacious due to laminectomy defects.   Mild bilateral neural foraminal stenosis, right more than left.   ??   L5-S1: Mild DDD. ??Disc bulge measures 3 mm. ??Bilateral facet joint DJD and   ligamentum flavum thickening. ??No significant central spinal canal stenosis.   Severe left and minimal right neural foraminal stenosis.   ??   ??   Impression   1. Acute/subacute L1 burst fracture, stable in appearance from 12/27/2018,   but significantly worsened from 12/21/2018. ??About 80% vertebral height loss   centrally and 6 mm retropulsion of fracture fragment into the spinal canal   contributing to  moderate spinal canal stenosis.   2. Severe left L5-S1 neural foraminal stenosis.   3. Mild multifactorial L3-L4 spinal canal stenosis  with mild-to-moderate   bilateral neural foraminal stenosis.   The findings were sent to the [facility] at 1:28   pm on 12/22/2020to be communicated to a licensed caregiver.   ??         VISITING DIAGNOSIS:      ICD-10-CM    1. Benign essential tremor  G25.0    2. Lumbar radicular pain  M54.16    3. Dizziness  R42    4. Chronic cerebral ischemia  I67.82    5. Anxiety  F41.9    6. Memory problem  R41.3    7. Balance problems  R26.89    8. Chronic low back pain with sciatica, sciatica laterality unspecified, unspecified back pain laterality  M54.40     G89.29    9. 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 Neck stiffness  M43.6    11. Carotid stenosis, asymptomatic, bilateral  I65.23    12. Left arm weakness  R29.898                 CONCERNS   &   INCREASED   RISK   FOR        * TIA,  CEREBRO  VASCULAR  ISCHEMIA, STROKE     *   DIZZINESS,   VERTEBROBASILAR  INSUFFICIENCY ,          *   COGNITIVE  &   MEMORY PROBLEMS  AND  DEMENTIAS       *  MONONEUROPATHIES,   RADICULOPATHY         *    BALANCE PROBLMES   AND  FALL                VARIOUS  RISK   FACTORS   WERE  REVIEWED   AND   DISCUSSED.          *  PATIENT   HAS  MULTIPLE   MEDICAL, NEUROLOGICAL                        AND   MENTAL HEALTH   PROBLEMS           PATIENT'S   MANAGEMENT  IS  CHALLENGING.            PLAN:                         Ashby Dawes*Nature  Of  The  Diagnoses,  The  Management & Treatment  Options            AND    Care  plan  Were          Reviewed and   Discussed   With  patient.           * Goals  And  Expectations  Of  The  Therapy  Discussed   And  Reviewed.          *   Benefits   And   Side  Effect  Profile  Of  Medication/s   Were   Discussed                * Need   For  Further   Follow up For  The  Various  Problems Were  discussed.          *  Results  Of  The  Previous  Diagnostic tests were reviewed and  discussed                   Medical  Decision  Making  Was  Made  Based on the   Complexity  Of   Above  Mentioned  Diagnoses,    Data reviewed             And    Risk  Of  Significant   Co morbidities and   complicating   Factors.                 Medical  Decision  Was      High   Complexity   Due   To  The  Patient's  Multiple  Symptoms  &  Disease,            Complex  Treatment  Regimen,  Multiple medications           and   Risk  Of   Side  Effects,  Difficulty  In  Medication  Management  And  Diagnostic  Challenges           In  View  Of  The  Associated   Co  Morbid  Conditions   And  Problems.                           * FALL   PRECAUTIONS.      THESE  REVIEWED   AND  DISCUSSED      *  USE   WALKING  ASSISTANCE  DEVICES     QUAD  CANE        *   BE  CAREFUL  WITH  ACTIVITIES   INCLUDING  DRIVING.        *   AVOID   NECK  AND/ BACK  STRAINING  ACTIVITIES          *   ADEQUATE   FLUID  INTAKE   AND  ELECTROLYTE  BALANCE           * KEEP  DAIRY  OF   THE  NEUROLOGICAL  SYMPTOMS        RECORDING THE    DURATION  AND  FREQUENCY.          *  AVOID    CONDITIONS  AND  FACTORS   THAT  MAKE                  NEUROLOGICAL  SYMPTOMS  WORSE.                   *TO  MAINTAIN  REGULAR  SLEEP  WAKE  CYCLES.     *   TO  HAVE  ADEQUATE  REST  AND   SLEEP    HOURS.            *    AVOID  ANY USAGE OF    TOBACCO,          AVOID  EXCESSIVE  ALCOHOL  AND   ILLEGAL   SUBSTANCES          *  CONTINUE   MEDICATIONS    PRESCRIBED       AS    RECOMMENDED       *   Compliance   With  Medications   And  Instructions        *  May   Use  Pill  Box,    If  Needed      *    Prophylactic  Use   Of     Vitamin   B   Complex,  Folic  Acid,    Vitamin  B12    Multivitamin,       Calcium  With  magnesium  And  Vit D    Supplementations   Over  The  Counter  Discussed             *  PATIENT  IS  ALSO   COUNSELED   TO  KEEP    ACTIVITIES:         A)   SIMPLE      B)  ORGANIZED      C)  WRITEDOWN                     *  EVALUATIONS  AND  FOLLOW UP:                   * PHYSICAL  THERAPY                                 *CARDIOLOGY                                               *       H/O     INTOLERANCE    TO    MYSOLINE   50   MG    1/2   TABLET       IN  DEC.  2021                                      AND   STOPPED    DUE   TO    SIDE  EFFECTS                               *           I    DO   NOT  RECOMMEND      ANY   ADDITIONAL  MEDICATIONS                                       AS   THE  PATIENT    ALREADY  ON    ATIVAN  0.5  MG   AT   BED  TIME                                                              *      MEDICATION   LIKE   ARICEPT      FOR  DEMENTIA     MANAGEMENT  DISCUSSED         WITH  PATIENT  AND  HER  DAUGHTER                                   PATIENT     WOULD  LIKE  TRY    THAT     AFTER  RETURNS   FROM                                         [place]     IN  April  2022                                  *       PATIENT  ALSO   WILL  BE  BENEFIT      EMG  /  NC  STUDIES     EVALUATIONS                                        IN  FUTURE      AS   CLINICALLY     INDICATED.                            *PATIENT   TO  FOLLOW  UP  WITH   PRIMARY  CARE         OTHER  CONSULTANTS  AS  BEFORE.               *TO  FOLLOW  WITH   MENTAL  HEALTH  PROFESSIONALS ,  INCLUDING            PSYCHOLOGICAL  COUNSELING   AND  PSYCHIATRIC  EVALUTIONS,                     *  Maintain   Healthy  Life Style    With   Periodic  Monitoring  Of          Any  Medical  Conditions  Including   Elevated  Blood  Pressure,  Lipid  Profile,        Blood  Sugar levels  AndHeart  Disease.                *   Period   Screening  For  Cancers  Involving  Breast,  Colon,         Lungs  And  Other  Organs  As  Applicable,  In consultation   With  Your  Primary Care Providers.               *Second  Neurological  Opinion  And  Evaluations  In  [facility]  Setting  If  Patient  Is  Interested.                 * Please   Contact   Neurology  Clinic   Early   If   Are  Any  New  Neurological   And  Any neurological  Concerns.                    *  If  The  Patient remains  Neurologically  Stable   Return   To  [facility] Neurology Department   IN      3 - 4      MONTHS  TIME   FOR  FURTHER              FOLLOW UP.                       *   The  Neurological   Findings,  Possible  Diagnosis,  Differential diagnoses                    And  Options  For    Further   Investigations                   And  management   Are  Discussed  Comprehensively.                    Medications   And  Prescription   Risks  And  Side effects  Are   Also  Discussed.                     *  If   There is  Any  Significant  Worsening   Of  Current  Symptoms  And  Or                  If patient  Develops   Any additional  New  NeurologicalSymptoms                  Or  Significant  Concerns   Should  Call  911 or  Go  To  Emergency  Department                  For  Further  Immediate  Evaluation and  management .                         The   Above  Were  Reviewed  With  PATIENT   and                         questions  Answered  In  Detail.                                                    Electronically signed by   Michel Bickers,  M.D., Charlynn Grimes.     Board Certified in  Neurology &  In  [place] [facility] ([facility])      DISCLAIMER:   Although every effort was made to ensure the accuracy of this  electronictranscription, some errors in transcription may have occurred.      GENERAL PATIENT INSTRUCTIONS:     ??? A Healthy Lifestyle: Care Instructions  ??? Your Care Instructions  ??? A healthy lifestyle can help you feel good, stay at ahealthy weight, and have plenty of energy for both work and play. A healthy lifestyle is something you can share with your whole family.  ??? A healthy lifestyle also  can lower your risk for serious health problems, such ashigh blood pressure, heart disease, and diabetes.  ??? You can follow a few steps listed below to improve your health and the health of your family.  ??? Follow-up careis  a key part of your treatment and safety. Be sure to make and go to all appointments, and call your doctor if you are having problems. It???s also a good idea to know your test results and keep a list of the medicines you take.  ??? How can you care for yourself at home?  ??? Do not eat too much sugar, fat, or fast foods. You can still have dessert and treats nowand then. The goal is moderation.  ??? Start small to improve your eating habits. Pay attention to portion sizes, drink less juice and soda pop, and eat more fruits and vegetables.  ??? Eat a healthy amount of food. A 3-ounce serving of meat, for example, is about the size of a deck of cards. Fill the rest of your plate with vegetables and whole grains.  ??? Limit theamount of soda and sports drinks you have every day. Drink more water when you are thirsty.  ??? Eat at least 5 servings of fruits and vegetables every day. It may seem like a lot, but it is not hard to reach this goal. Aserving or helping is 1 piece of fruit, 1 cup of vegetables, or 2 cups of leafy, raw vegetables. Have an apple or some carrot sticks as an afternoon snack instead of a candy bar. Try to have fruits and/or vegetables at everymeal.  ??? Make exercise part of your daily routine. You may want to start with simple activities, such as walking, bicycling, or slow swimming. Try tobe active 30 to 60 minutes every day. You do not need to do all 30 to 60 minutes all at once. For example, you can exercise 3 times a day for 10 or 20 minutes. Moderate exercise is safe for most people, but it is always agood idea to talk to your doctor before starting an exercise program.  ??? Keep moving. Mow the lawn, work in the garden, or [employer]. Take the stairs instead of the elevator at work.  ??? If you smoke, quit. Peoplewho smoke have an increased risk for heart attack, stroke, cancer, and other lung illnesses. Quitting is hard, but there are ways to boost your chance of quitting tobacco for good.  ??? Use  nicotine gum, patches, or lozenges.  ??? Ask your doctor about stop-smoking programs and medicines.  ??? Keep trying.  ??? In addition to reducing your risk of diseases in the future, you will notice some benefits soon after you stop using tobacco. If you have shortness of breath or asthma symptoms, they will likely getbetter within a few weeks after you quit.  ??? Limit how much alcohol you drink. Moderate amounts of alcohol (up to 2 drinks a day for men, 1drink a day for women) are okay. But drinking too much can lead to liver problems, high blood pressure, and other health problems.  ??? health  ??? If you have a family, there are many things you can do together to improve your health.  ??? Eat meals together as a family as often as possible.  ??? Eat healthy foods. This includes fruits, vegetables, lean meats and dairy, and whole grains.  ??? Include your family in your fitness plan. Most peoplethink of activities such as jogging or tennis as the way to  fitness, but there are many ways you and your family can be more active. Anything that makes you breathe hard and gets your heart pumping is exercise. Here are sometips:  ??? Walk to do errands or to take your child to school or the bus.  ??? Go for a family bike ride after dinner instead of watching TV.  ??? Where can you learn more?  ??? Go [email or web address] and sign in to your MyChart account. Enter [phone] in the Search HealthInformation box to learn more about "A Healthy Lifestyle: Care Instructions."     If you do not have anaccount, please click on the "Sign Up Now" link.  ??? Current as of: August 05, 2014  ??? Content Version: 11.2  ??? ?? 2006-2017 [employer]. Care instructions adapted under license by [facility]. If you have questions about a medical condition or this instruction, always ask your healthcare professional. [employer] disclaims any warranty or liability for your use of this information.

## 2020-01-06 NOTE — Telephone Encounter (Signed)
From: Roselyn Reef Kuroda  To: Dr. [name]  Sent: 01/05/2020 12:40 PM EST  Subject: Dementia Medicine     Is there a med that doesn???t have tremors as a side effect? I am concerned about aricept because tremors are a side effect.

## 2020-01-07 ENCOUNTER — Telehealth

## 2020-01-07 MED ORDER — PROLIA 60 MG/ML SC SOSY
60 | Freq: Once | SUBCUTANEOUS | 0 refills | Status: DC
Start: 2020-01-07 — End: 2020-01-08

## 2020-01-07 NOTE — Telephone Encounter (Signed)
Pt requesting to try [facility] in [place]. Per pharm, provider has to send in an rx for them to run to see if it will be covered.     Pended for you to be sent to [facility] in [place].     Cash called requesting a refill of the below medication which has been pended for you:     Requested Prescriptions     Pending Prescriptions Disp Refills   ??? denosumab (PROLIA) 60 MG/ML SOSY SC injection 1 mL 0     Sig: Inject 1 mL into the skin once for 1 dose     Signed Prescriptions Disp Refills   ??? denosumab (PROLIA) 60 MG/ML SOSY SC injection 1 mL 0     Sig: Inject 1 mL into the skin once for 1 dose     Authorizing Provider: [name]       Last Appointment Date: 12/04/2019  Next Appointment Date: 05/04/2020    Allergies   Allergen Reactions   ??? Morphine Other (See Comments)     Night sweats,felt "loopy".   ??? Pcn [Penicillins] Hives   ??? Zithromax [Azithromycin] Hives   ??? Adhesive Tape Rash

## 2020-01-07 NOTE — Telephone Encounter (Signed)
Pt called in stating she needed to be scheduled for prolia shots one in December and then another one in may if possible, thank you!

## 2020-01-07 NOTE — Telephone Encounter (Signed)
Rx for Prolia injection sent to [facility] pharmacy to check on coverage; if covered, will place order for her to come to Injection Room to have done.

## 2020-01-08 NOTE — Telephone Encounter (Signed)
New Rx sent to pharmacy - please call and check on coverage on 12/30.

## 2020-01-09 MED ORDER — PROLIA 60 MG/ML SC SOSY
60 MG/ML | Freq: Once | SUBCUTANEOUS | 0 refills | Status: DC
Start: 2020-01-09 — End: 2020-05-04

## 2020-01-09 NOTE — Telephone Encounter (Signed)
Patient called back. Informed of how much injection would be after insurance. Patient states she is not paying that much and will just continue to stick with the pills.

## 2020-01-09 NOTE — Telephone Encounter (Signed)
Spoke to [employer]. Per pharm, after insurance, the injection would be $360.31. Left message for patient to return call regarding this.

## 2020-03-12 ENCOUNTER — Encounter

## 2020-03-12 NOTE — Telephone Encounter (Signed)
[place], last fill 12/9, quantity 90 for 90 days.       Katie Juarez called requesting a refill of the below medication which has been pended for you:     Requested Prescriptions     Pending Prescriptions Disp Refills   ??? LORazepam (ATIVAN) 0.5 MG tablet 90 tablet 0     Sig: Take 1 tablet by mouth nightly as needed for Anxiety for up to 90 days.       Last Appointment Date: 12/04/2019  Next Appointment Date: 05/04/2020    Allergies   Allergen Reactions   ??? Morphine Other (See Comments)     Night sweats,felt "loopy".   ??? Pcn [Penicillins] Hives   ??? Zithromax [Azithromycin] Hives   ??? Adhesive Tape Rash

## 2020-03-13 MED ORDER — LORAZEPAM 0.5 MG PO TABS
0.5 MG | ORAL_TABLET | Freq: Every evening | ORAL | 0 refills | Status: DC | PRN
Start: 2020-03-13 — End: 2020-06-13

## 2020-03-13 NOTE — Telephone Encounter (Signed)
Controlled Substance Monitoring:    Acute and Chronic Pain Monitoring:   RX Monitoring 03/13/2020   Periodic Controlled Substance Monitoring No signs of potential drug abuse or diversion identified.    Obtained or confirmed "Medication Contract" on file.         Pt's Medication Contract is under "Media" - please add to PDMP.

## 2020-03-16 NOTE — Telephone Encounter (Signed)
Updated medication contract.

## 2020-04-06 NOTE — Telephone Encounter (Signed)
Pt calling stating she just got back from [place] and while she was going KB was going to look into a med for pt for her dementia that doesn't cause tremors, pt uses pended pharmacy, please advise at above number.

## 2020-04-09 ENCOUNTER — Encounter

## 2020-04-09 NOTE — Telephone Encounter (Signed)
Patient aware pcp is out of office today and states she is fine until pcp returns tomorrow.       Rahma called requesting a refill of the below medication which has been pended for you:     Requested Prescriptions     Pending Prescriptions Disp Refills   ??? alendronate (FOSAMAX) 70 MG tablet 12 tablet 3     Sig: Take 1 tablet by mouth every 7 days       Last Appointment Date: 12/04/2019  Next Appointment Date: 05/04/2020    Allergies   Allergen Reactions   ??? Morphine Other (See Comments)     Night sweats,felt "loopy".   ??? Pcn [Penicillins] Hives   ??? Zithromax [Azithromycin] Hives   ??? Adhesive Tape Rash

## 2020-04-11 MED ORDER — ALENDRONATE SODIUM 70 MG PO TABS
70 MG | ORAL_TABLET | ORAL | 3 refills | Status: AC
Start: 2020-04-11 — End: 2022-07-24

## 2020-04-13 ENCOUNTER — Encounter

## 2020-04-13 NOTE — Telephone Encounter (Signed)
Katie Juarez called requesting a refill of the below medication which has been pended for you:     Requested Prescriptions     Pending Prescriptions Disp Refills   ??? budesonide-formoterol (SYMBICORT) 160-4.5 MCG/ACT AERO       Sig: Inhale 2 puffs into the lungs 2 times daily Rinse mouth or brush teeth after each use.       Last Appointment Date: 12/04/2019  Next Appointment Date: 05/04/2020    Allergies   Allergen Reactions   ??? Morphine Other (See Comments)     Night sweats,felt "loopy".   ??? Pcn [Penicillins] Hives   ??? Zithromax [Azithromycin] Hives   ??? Adhesive Tape Rash

## 2020-04-14 NOTE — Telephone Encounter (Signed)
Duplicate. Open in error.

## 2020-04-15 MED ORDER — BUDESONIDE-FORMOTEROL FUMARATE 160-4.5 MCG/ACT IN AERO
Freq: Two times a day (BID) | RESPIRATORY_TRACT | 3 refills | Status: DC
Start: 2020-04-15 — End: 2021-02-02

## 2020-05-04 ENCOUNTER — Ambulatory Visit: Admit: 2020-05-04 | Discharge: 2020-05-04 | Payer: MEDICARE | Attending: Family Medicine | Primary: Family Medicine

## 2020-05-04 DIAGNOSIS — R413 Other amnesia: Secondary | ICD-10-CM

## 2020-05-04 MED ORDER — DONEPEZIL HCL 5 MG PO TABS
5 MG | ORAL_TABLET | Freq: Every evening | ORAL | 1 refills | Status: DC
Start: 2020-05-04 — End: 2020-07-01

## 2020-05-04 NOTE — Progress Notes (Signed)
[facility]  [address] [place] [address]  [id number]      Katie Juarez is a 80 y.o. female who presents today for her medical conditions/complaints as noted below.  Katie Juarez is c/o of Hypertension, Tremors, and Anxiety      HPI:     Pt here today for follow-up of HTN, anxiety, and tremors.    Pt can get easily frustrated with her memory - forgets her phone sometimes and forgets names often; does not forget appt's or obligations.  Only occasionally hears from her children that she already asked them something or occasionally loses thing around the house.  Wondering about starting something for memory; had discussed possible Aricept with Dr. Sherilyn Juarez at her [place] with him in 12/2019.    Had tried Primidone very briefly in 12/2019 for tremor; stopped due to "side effects" (per Dr. Phylis Juarez OV note).  He stated he did not recommend any additional medication for tremor since she has the Ativan that she already takes at bedtime.      Taking Miralax once daily for constipation; also just started taking hemorrhoid gel for hemorrhoids.  Has been passing smaller sized stools, but does move her bowels daily.  Also has gas/bloating more often.  Has started taking a daily probiotic as well.    Only taking Lipitor 20 mg only twice weekly for HLD.  ??  Has had more weakness in her L arm x past month - feels like everything "weighs 100 lbs".  Has a hard time picking up a gallon of milk.  ??  Taking Celexa 20 mg daily and Lorazepam 0.5 mg nightly before bedtime for anxiety.  ??  BP well-controlled today - 116/80.    ??  Taking Vit D3 2000 IU daily and Calcium + D BID, along with MVI daily and cranberry tab.  No recent UTI.  ??  Using Symbicort inhaler BID    Taking Fosamax 60 mg once weekly        Past Medical History:   Diagnosis Date   ??? Basal cell carcinoma (BCC) of face 10/2018    excised by Dr Katie Juarez    ??? Breast cancer ([place]) 1999    in remission   ??? Constipation  12/27/2018   ??? COPD (chronic obstructive pulmonary disease) ([place])    ??? DDD (degenerative disc disease)    ??? Large bowel obstruction ([place]) 02/2015    [facility]   ??? LBP (low Juarez pain)    ??? Pharyngoesophageal dysphagia    ??? SS (spinal stenosis)       Past Surgical History:   Procedure Laterality Date   ??? APPENDECTOMY  1956   ??? BLADDER SUSPENSION  1990   ??? BREAST LUMPECTOMY Right 1999   ??? CHOLECYSTECTOMY  1990   ??? EXCISION/BIOPSY Left 11/01/2018    Dr Katie Juarez / Katie Juarez, for [facility]   ??? FEMORAL HERNIA REPAIR Left 02/20/2015    strangulated with small bowel resection - [facility] [place]   ??? [place] for endometriosis; done in [place]   ??? LUMBAR SPINE SURGERY  01/03/2019    VERTEBRAL AUMENTATION  L1     ??? OTHER SURGICAL HISTORY  11/14/13, 06/23/11, 06/30/11, 07/12/11 12/29/11    L4/L5 IESI   ??? OTHER SURGICAL HISTORY  12/17/2013    L5/S1 IESI   ??? OTHER SURGICAL HISTORY Bilateral 05/30/2014    SIJ and piriformis   ???  OTHER SURGICAL HISTORY Bilateral 09/16/2014    L3, L4, L5 Diagnostic Medial Branch Block   ??? OTHER SURGICAL HISTORY Bilateral 09/26/2014    L5 TFE   ??? OTHER SURGICAL HISTORY  10/10/2014    bil L5 TFE   ??? OTHER SURGICAL HISTORY  10/31/2014    caudal   ??? SMALL INTESTINE SURGERY  02/20/2015    strangulated left femoral hernia   ??? SPINE SURGERY N/A 01/03/2019    VERTEBRAL AUMENTATION  L1  (C-ARM X2, performed by Katie Back, MD at [place]   ??? TONSILLECTOMY  1946   ??? UPPER GASTROINTESTINAL ENDOSCOPY N/A 04/24/2019    EGD BIOPSY performed by Katie Fam, [facility] at [facility]     Family History   Problem Relation Age of Onset   ??? Other Mother         ALS - diagnosed at age 10   ??? Stroke Father         age 72   ??? Cancer Sister         pt thinks it was liver   ??? Cancer Brother         leukemia   ??? Diabetes Maternal Grandfather    ??? Other Brother         drowned   ??? Other Brother         died at age 81; think due to spinal meningitis   ??? Lupus Sister    ??? Alzheimer's Disease  Sister    ??? Dementia Sister    ??? Mult Sclerosis Nephew    ??? Mult Sclerosis Daughter      Social History     Tobacco Use   ??? Smoking status: Former Smoker     Packs/day: 1.00     Years: 40.00     Pack years: 40.00     Start date: 01/10/1953     Quit date: 01/11/1988     Years since quitting: 32.3   ??? Smokeless tobacco: Never Used   ??? Tobacco comment: quit 25 years ago 1990   Substance Use Topics   ??? Alcohol use: No     Alcohol/week: 0.0 standard drinks      Current Outpatient Medications   Medication Sig Dispense Refill   ??? Probiotic Acidophilus (FLORANEX) TABS Take 1 tablet by mouth daily     ??? polyethylene glycol (GLYCOLAX) 17 GM/SCOOP powder Take 17 g by mouth daily     ??? donepezil (ARICEPT) 5 MG tablet Take 1 tablet by mouth nightly 30 tablet 1   ??? budesonide-formoterol (SYMBICORT) 160-4.5 MCG/ACT AERO Inhale 2 puffs into the lungs 2 times daily Rinse mouth or brush teeth after each use. 30.6 g 3   ??? alendronate (FOSAMAX) 70 MG tablet Take 1 tablet by mouth every 7 days 12 tablet 3   ??? LORazepam (ATIVAN) 0.5 MG tablet Take 1 tablet by mouth nightly as needed for Anxiety for up to 90 days. 90 tablet 0   ??? citalopram (CELEXA) 20 MG tablet Take 1.5 tablets by mouth daily 135 tablet 1   ??? CRANBERRY PO Take by mouth     ??? Cholecalciferol (VITAMIN D3) 50 MCG (2000 UT) CAPS Take by mouth     ??? Handicap Placard MISC by Does not apply route Issue parking placard for person with disability; Applicant meets the qualifying disability criteria. Length of time expected to have disability.  Prescription expires in 5 years from issuing date. 1 each 0   ???  CALCIUM CARBONATE-VITAMIN D PO Take 600 mg by mouth 2 times daily Contains 600 mg Calcium & 800 IU Vitamin D3.     ??? albuterol sulfate HFA (VENTOLIN HFA) 108 (90 Base) MCG/ACT inhaler Ventolin HFA 90 mcg/actuation aerosol inhaler   Inhale 2 puffs every 6 hours by inhalation route as needed.   ASTHMA     ??? polyvinyl alcohol-povidone (HYPOTEARS) 1.4-0.6 % ophthalmic solution Place 1-2  drops into both eyes as needed.     ??? aspirin 81 MG EC tablet Take 81 mg by mouth daily Indications: takes irregularly  (Patient not taking: Reported on 01/02/2020)     ??? Multiple Vitamins-Minerals (THERAPEUTIC MULTIVITAMIN-MINERALS) tablet Take 1 tablet by mouth daily     ??? docusate sodium (COLACE) 100 MG capsule Take 100 mg by mouth nightly  (Patient not taking: Reported on 01/02/2020)       No current facility-administered medications for this visit.     Allergies   Allergen Reactions   ??? Morphine Other (See Comments)     Night sweats,felt "loopy".   ??? Pcn [Penicillins] Hives   ??? Zithromax [Azithromycin] Hives   ??? Adhesive Tape Rash       Health Maintenance   Topic Date Due   ??? Depression Screen  05/06/2021   ??? Annual Wellness Visit (AWV)  05/07/2021   ??? DTaP/Tdap/Td vaccine (2 - Td or Tdap) 09/28/2027   ??? DEXA (modify frequency per FRAX score)  Completed   ??? Flu vaccine  Completed   ??? Shingles vaccine  Completed   ??? Pneumococcal 65+ years Vaccine  Completed   ??? COVID-19 Vaccine  Completed   ??? Hepatitis A vaccine  Aged Out   ??? Hepatitis B vaccine  Aged Out   ??? Hib vaccine  Aged Out   ??? Meningococcal (ACWY) vaccine  Aged Out       Subjective:      Review of Systems   Constitutional: Negative for unexpected weight change.   Neurological: Positive for tremors.   Psychiatric/Behavioral: The patient is nervous/anxious (stable).        Objective:     Vitals:    05/04/20 1122   BP: 116/80   Site: Right Upper Arm   Position: Sitting   Cuff Size: Medium Adult   Pulse: 62   Temp: 97.7 ??F (36.5 ??C)   TempSrc: Temporal   SpO2: 94%   Weight: 137 lb 3.2 oz (62.2 kg)   Height: 5\' 4"  (1.626 m)     Physical Exam  Vitals and nursing note reviewed.   Constitutional:       General: She is not in acute distress.     Appearance: She is well-developed.   HENT:      Head: Normocephalic and atraumatic.      Right Ear: Tympanic membrane, ear canal and external ear normal.      Left Ear: Tympanic membrane, ear canal and external ear  normal.      Nose: Nose normal.      Mouth/Throat:      Mouth: Mucous membranes are moist.      Pharynx: Oropharynx is clear. No posterior oropharyngeal erythema.   Eyes:      Conjunctiva/sclera: Conjunctivae normal.   Cardiovascular:      Rate and Rhythm: Normal rate and regular rhythm.      Heart sounds: Normal heart sounds.   Pulmonary:      Effort: Pulmonary effort is normal. No respiratory distress.      Breath  sounds: Normal breath sounds.   Abdominal:      General: Bowel sounds are normal. There is no distension.      Palpations: Abdomen is soft.      Tenderness: There is no abdominal tenderness.   Musculoskeletal:      Cervical Juarez: Neck supple.   Skin:     General: Skin is warm and dry.   Neurological:      Mental Status: She is alert and oriented to person, place, and time.      Motor: Tremor (hand) present.         Assessment:      1. Memory problem  -     donepezil (ARICEPT) 5 MG tablet; Take 1 tablet by mouth nightly, Disp-30 tablet, R-1Normal  2. Anxiety  3. Tremor  4. Essential hypertension         Plan:      Return in about 3 months (around 08/03/2020) for f/u memory, tremor.    No orders of the defined types were placed in this encounter.    Orders Placed This Encounter   Medications   ??? donepezil (ARICEPT) 5 MG tablet     Sig: Take 1 tablet by mouth nightly     Dispense:  30 tablet     Refill:  1       Patient given educational materials - see patient instructions.  Discussed use, benefit, and side effects of prescribed medications.  All patient questions answered.  Pt voiced understanding.  Reviewed health maintenance.            Electronically signed by Belinda Fisher, DO, DO on 05/10/2020 at 11:51 PM

## 2020-05-04 NOTE — Telephone Encounter (Signed)
OV with Dr. Renard Hamper today.

## 2020-05-06 ENCOUNTER — Telehealth: Admit: 2020-05-06 | Discharge: 2020-05-06 | Payer: MEDICARE | Attending: Family Medicine | Primary: Family Medicine

## 2020-05-06 DIAGNOSIS — Z0000 Encounter for general adult medical examination without abnormal findings: Secondary | ICD-10-CM

## 2020-05-06 NOTE — Progress Notes (Signed)
Medicare Annual Wellness Visit    Katie Juarez is here for Medicare AWV    Assessment & Plan   Medicare annual wellness visit, subsequent      Recommendations for Preventive Services Due: see orders and patient instructions/AVS.  Recommended screening schedule for the next 5-10 years is provided to the patient in written form: see Patient Instructions/AVS.     No follow-ups on file.     Subjective     Patient's complete Health Risk Assessment and screening values have been reviewed and are found in Flowsheets. The following problems were reviewed today and where indicated follow up appointments were made and/or referrals ordered.    Positive Risk Factor Screenings with Interventions:    Fall Risk:  Do you feel unsteady or are you worried about falling? : (!) yes  2 or more falls in past year?: no  Fall with injury in past year?: no     Fall Risk Interventions:    ?? Reports she has a dog and she tries to be cautious so she doesn't trip over him or the dog toys                       Objective      Patient-Reported Vitals  No data recorded               Allergies   Allergen Reactions   ??? Morphine Other (See Comments)     Night sweats,felt "loopy".   ??? Pcn [Penicillins] Hives   ??? Zithromax [Azithromycin] Hives   ??? Adhesive Tape Rash     Prior to Visit Medications    Medication Sig Taking? Authorizing Provider   Probiotic Acidophilus (FLORANEX) TABS Take 1 tablet by mouth daily  Historical Provider, MD   polyethylene glycol (GLYCOLAX) 17 GM/SCOOP powder Take 17 g by mouth daily  Historical Provider, MD   donepezil (ARICEPT) 5 MG tablet Take 1 tablet by mouth nightly  Leandrew Koyanagi Billie Intriago, DO   budesonide-formoterol (SYMBICORT) 160-4.5 MCG/ACT AERO Inhale 2 puffs into the lungs 2 times daily Rinse mouth or brush teeth after each use.  Leandrew Koyanagi Jarell Mcewen, DO   alendronate (FOSAMAX) 70 MG tablet Take 1 tablet by mouth every 7 days  [name], DO   LORazepam (ATIVAN) 0.5 MG tablet Take 1 tablet by mouth nightly as needed for  Anxiety for up to 90 days.  Leandrew Koyanagi Jodean Valade, DO   citalopram (CELEXA) 20 MG tablet Take 1.5 tablets by mouth daily  Leandrew Koyanagi Adrin Julian, DO   CRANBERRY PO Take by mouth  Historical Provider, MD   Cholecalciferol (VITAMIN D3) 50 MCG (2000 UT) CAPS Take by mouth  Historical Provider, MD   Handicap Placard MISC by Does not apply route Issue parking placard for person with disability; Applicant meets the qualifying disability criteria. Length of time expected to have disability.  Prescription expires in 5 years from issuing date.  Leandrew Koyanagi Thorin Starner, DO   aspirin 81 MG EC tablet Take 81 mg by mouth daily Indications: takes irregularly   Patient not taking: Reported on 01/02/2020  Historical Provider, MD   CALCIUM CARBONATE-VITAMIN D PO Take 600 mg by mouth 2 times daily Contains 600 mg Calcium & 800 IU Vitamin D3.  Historical Provider, MD   Multiple Vitamins-Minerals (THERAPEUTIC MULTIVITAMIN-MINERALS) tablet Take 1 tablet by mouth daily  Historical Provider, MD   albuterol sulfate HFA (VENTOLIN HFA) 108 (90 Base) MCG/ACT inhaler Ventolin HFA 90 mcg/actuation aerosol inhaler  Inhale 2 puffs every 6 hours by inhalation route as needed.   ASTHMA  Historical Provider, MD   docusate sodium (COLACE) 100 MG capsule Take 100 mg by mouth nightly   Patient not taking: Reported on 01/02/2020  Historical Provider, MD   polyvinyl alcohol-povidone (HYPOTEARS) 1.4-0.6 % ophthalmic solution Place 1-2 drops into both eyes as needed.  Historical Provider, MD       CareTeam (Including outside providers/suppliers regularly involved in providing care):   Patient Care Team:  [name], DO as PCP - General (Family Medicine)  [name], DO as PCP - [facility] Empaneled Provider  Loura Back, MD as Consulting Physician (Orthopedic Surgery)    Reviewed and updated this visit:  Tobacco   Allergies   Meds   Med Hx   Surg Hx   Soc Hx   Fam Hx           Katie Juarez, was evaluated through a synchronous (real-time) audio-video encounter. The  patient (or guardian if applicable) is aware that this is a billable service, which includes applicable co-pays. This Virtual Visit was conducted with patient's (and/or legal guardian's) consent. The visit was conducted pursuant to the emergency declaration under the [place], [place] waiver authority and the [employer] Act.  Patient identification was verified, and a caregiver was present when appropriate. The patient was located at home in a state where the provider was licensed to provide care.      This encounter was performed under my, [name], DO???s, direct supervision, 05/06/2020.

## 2020-05-06 NOTE — Patient Instructions (Signed)
Personalized Preventive Plan for Katie Juarez - 05/06/2020  Medicare offers a range of preventive health benefits. Some of the tests and screenings are paid in full while other may be subject to a deductible, co-insurance, and/or copay.    Some of these benefits include a comprehensive review of your medical history including lifestyle, illnesses that may run in your family, and various assessments and screenings as appropriate.    After reviewing your medical record and screening and assessments performed today your provider may have ordered immunizations, labs, imaging, and/or referrals for you.  A list of these orders (if applicable) as well as your Preventive Care list are included within your After Visit Summary for your review.    Other Preventive Recommendations:    ?? A preventive eye exam performed by an eye specialist is recommended every 1-2 years to screen for glaucoma; cataracts, macular degeneration, and other eye disorders.  ?? A preventive dental visit is recommended every 6 months.  ?? Try to get at least 150 minutes of exercise per week or 10,000 steps per day on a pedometer .  ?? Order or download the FREE "Exercise & Physical Activity: Your Everyday Guide" from [employer]. Call (229)409-4533 or search [employer] online.  ?? You need 1200-1500 mg of calcium and 1000-2000 IU of vitamin D per day. It is possible to meet your calcium requirement with diet alone, but a vitamin D supplement is usually necessary to meet this goal.  ?? When exposed to the sun, use a sunscreen that protects against both UVA and UVB radiation with an SPF of 30 or greater. Reapply every 2 to 3 hours or after sweating, drying off with a towel, or swimming.  ?? Always wear a seat belt when traveling in a car. Always wear a helmet when riding a bicycle or motorcycle.

## 2020-06-11 ENCOUNTER — Encounter

## 2020-06-11 ENCOUNTER — Telehealth

## 2020-06-11 NOTE — Telephone Encounter (Signed)
Patient is calling stating that she has horrible back pain that is worse when she is laying down and it is going down the outside of both legs. Patient states that she is using tylenol and ibuprofen and that helps some. She does get up and walks around and that helps. Patient was offered UC but patient declined and wanted to wait for KB. Please advise  Patient is also requesting a refill of Ativan

## 2020-06-11 NOTE — Telephone Encounter (Signed)
Per [place], last fill 3/8, quantity 90 for 30 days.     Dennie called requesting a refill of the below medication which has been pended for you:     Requested Prescriptions     Pending Prescriptions Disp Refills   ??? LORazepam (ATIVAN) 0.5 MG tablet 90 tablet 0     Sig: Take 1 tablet by mouth nightly as needed for Anxiety for up to 90 days.       Last Appointment Date: 05/06/2020  Next Appointment Date: 08/04/2020    Allergies   Allergen Reactions   ??? Morphine Other (See Comments)     Night sweats,felt "loopy".   ??? Pcn [Penicillins] Hives   ??? Zithromax [Azithromycin] Hives   ??? Adhesive Tape Rash

## 2020-06-11 NOTE — Telephone Encounter (Signed)
Spoke to patient. Patient states this has been constant back pain with no injuries. Advised KB is off today and does not have any openings tomorrow 06/12/20. Offered patient an appt with different provider today, patient states today does not work. Patient states she will go to urgent care when she has time. Will send medication in refill request.

## 2020-06-13 ENCOUNTER — Encounter

## 2020-06-13 MED ORDER — LORAZEPAM 0.5 MG PO TABS
0.5 MG | ORAL_TABLET | Freq: Every evening | ORAL | 0 refills | Status: DC | PRN
Start: 2020-06-13 — End: 2020-09-15

## 2020-06-13 NOTE — Telephone Encounter (Signed)
Controlled Substance Monitoring:    Acute and Chronic Pain Monitoring:   RX Monitoring 06/13/2020   Periodic Controlled Substance Monitoring No signs of potential drug abuse or diversion identified.    Obtained or confirmed "Medication Contract" on file.

## 2020-06-15 ENCOUNTER — Inpatient Hospital Stay: Admit: 2020-06-15 | Payer: MEDICARE | Primary: Family Medicine

## 2020-06-15 ENCOUNTER — Inpatient Hospital Stay: Payer: MEDICARE | Primary: Family Medicine

## 2020-06-15 ENCOUNTER — Ambulatory Visit: Admit: 2020-06-15 | Discharge: 2020-06-15 | Payer: MEDICARE | Attending: Family | Primary: Family Medicine

## 2020-06-15 DIAGNOSIS — R252 Cramp and spasm: Secondary | ICD-10-CM

## 2020-06-15 DIAGNOSIS — R300 Dysuria: Secondary | ICD-10-CM

## 2020-06-15 LAB — URINALYSIS WITH REFLEX TO CULTURE
Bilirubin Urine: NEGATIVE
Glucose, Ur: NEGATIVE
Ketones, Urine: NEGATIVE
Leukocyte Esterase, Urine: NEGATIVE
Nitrite, Urine: NEGATIVE
Protein, UA: NEGATIVE
Specific Gravity, UA: 1.02 (ref 1.010–1.025)
Urine Hgb: NEGATIVE
Urobilinogen, Urine: NORMAL
pH, UA: 8 — ABNORMAL HIGH (ref 5.0–6.0)

## 2020-06-15 LAB — CBC WITH AUTO DIFFERENTIAL
Absolute Eos #: 0.19 10*3/uL (ref 0.00–0.44)
Absolute Immature Granulocyte: 0.18 10*3/uL (ref 0.00–0.30)
Absolute Lymph #: 1.83 10*3/uL (ref 1.10–3.70)
Absolute Mono #: 1.05 10*3/uL (ref 0.10–1.20)
Basophils Absolute: 0.09 10*3/uL (ref 0.00–0.20)
Basophils: 1 % (ref 0–2)
Eosinophils %: 2 % (ref 1–4)
Hematocrit: 43.1 % (ref 36.3–47.1)
Hemoglobin: 14.4 g/dL (ref 11.9–15.1)
Immature Granulocytes: 2 % — ABNORMAL HIGH
Lymphocytes: 16 % — ABNORMAL LOW (ref 24–43)
MCH: 32.9 pg (ref 25.2–33.5)
MCHC: 33.4 g/dL (ref 25.2–33.5)
MCV: 98.4 fL (ref 82.6–102.9)
MPV: 10.3 fL (ref 8.1–13.5)
Monocytes: 9 % (ref 3–12)
NRBC Automated: 0 per 100 WBC
Platelets: 199 10*3/uL (ref 138–453)
RBC: 4.38 m/uL (ref 3.95–5.11)
RDW: 13.5 % (ref 11.8–14.4)
Seg Neutrophils: 70 % — ABNORMAL HIGH (ref 36–65)
Segs Absolute: 8.35 10*3/uL — ABNORMAL HIGH (ref 1.50–8.10)
WBC: 11.7 10*3/uL — ABNORMAL HIGH (ref 3.5–11.3)

## 2020-06-15 LAB — MICROSCOPIC URINALYSIS
Epithelial Cells UA: 0 /HPF (ref 0–5)
RBC, UA: 0 /HPF (ref 0–4)
WBC, UA: 0 /HPF (ref 0–4)

## 2020-06-15 LAB — COMPREHENSIVE METABOLIC PANEL
ALT: 18 U/L (ref 5–33)
AST: 27 U/L (ref <32)
Albumin/Globulin Ratio: 1.3 (ref 1.0–2.5)
Albumin: 4.1 g/dL (ref 3.5–5.2)
Alkaline Phosphatase: 61 U/L (ref 35–104)
Anion Gap: 10 mmol/L (ref 9–17)
BUN: 22 mg/dL (ref 8–23)
Bun/Cre Ratio: 22 — ABNORMAL HIGH (ref 9–20)
CO2: 28 mmol/L (ref 20–31)
Calcium: 10 mg/dL (ref 8.6–10.4)
Chloride: 102 mmol/L (ref 98–107)
Creatinine: 1.01 mg/dL — ABNORMAL HIGH (ref 0.50–0.90)
GFR African American: >60 mL/min (ref >60)
GFR Non-African American: 53 mL/min — ABNORMAL LOW (ref >60)
Glucose: 97 mg/dL (ref 70–99)
Potassium: 4.1 mmol/L (ref 3.7–5.3)
Sodium: 140 mmol/L (ref 135–144)
Total Bilirubin: 0.41 mg/dL (ref 0.3–1.2)
Total Protein: 7.2 g/dL (ref 6.4–8.3)

## 2020-06-15 NOTE — Other (Signed)
Please notify patient of lab results. Her WBCs are slightly elevated and her renal function has declined some since last checked. She should try to drink more water and cut back on the amount of MiraLax that she is taking. She may be having a flare of diverticulitis. Please ask about her symptoms and if she is still having a lot of muscle cramps or abdominal pain. I did not identify any clear indication for an active infection yesterday. She had Right sided flank pain that was more of her chronic back pain and urine culture is pending. UA only showed bacteria and no other indication of an infection but we can treat with Keflex if she has symptoms.

## 2020-06-15 NOTE — Telephone Encounter (Signed)
Filled 06/13/20 #90/0 refills

## 2020-06-15 NOTE — Patient Instructions (Addendum)
Patient Education        Muscle Cramps: Care Instructions  Your Care Instructions     A muscle cramp occurs when a muscle tightens up suddenly. A cramp often happensin the legs. A muscle cramp is also called a muscle spasm or a charley horse.  Muscle cramps usually last less than a minute. However, the pain may last forseveral minutes. Leg cramps that occur at night may wake you up.  Heavy exercise, dehydration, and being overweight can increase your risk of getting cramps. An imbalance of certain chemicals in your blood, called electrolytes, can also lead to muscle cramps. Pregnant women sometimes getmuscle cramps during sleep.  Muscle cramps can be treated by stretching and massaging the muscle. If crampskeep coming back, your doctor may prescribe medicine that relaxes your muscles.  Follow-up care is a key part of your treatment and safety. Be sure to make and go to all appointments, and call your doctor if you are having problems. It's also a good idea to know your test results and keep alist of the medicines you take.  How can you care for yourself at home?  ??? Drink plenty of fluids to prevent dehydration. Choose water and other clear liquids until you feel better. If you have kidney, heart, or liver disease and have to limit fluids, talk with your doctor before you increase the amount of fluids you drink.  ??? Stretch your muscles every day, especially before and after exercise and at bedtime. Regular stretching can relax your muscles and may prevent cramps.  ??? Do not suddenly increase the amount of exercise you get. Increase your exercise a little each week.  ??? When you get a cramp, stretch and massage the muscle. You can also take a warm shower or bath to relax the muscle. A heating pad placed on the muscle can also help.  ??? Take a daily multivitamin supplement.  ??? Ask your doctor if you can take an over-the-counter pain medicine, such as acetaminophen (Tylenol), ibuprofen (Advil, Motrin), or naproxen  (Aleve). Be safe with medicines. Read and follow all instructions on the label.  When should you call for help?  Watch closely for changes in your health, and be sure to contact your doctor if:  ?? ??? You get muscle cramps often that do not go away after home treatment.   ?? ??? Your muscle cramps often wake you up at night.   ?? ??? You do not get better as expected.   Where can you learn more?  Go to [email or web address] and sign in to your MyChart account. Enter [phone] in the [place] box to learn more about "Muscle Cramps: Care Instructions."     If you do not have an account, please click on the "Sign Up Now" link.  Current as of: July 11, 2019??????????????????????????????Content Version: 13.2  ?? 2006-2022 Healthwise, Incorporated.   Care instructions adapted under license by [facility]. If you have questions about a medical condition or this instruction, always ask your healthcare professional. [place] any warranty or liability for your use of this information.         Patient Education        Diarrhea: Care Instructions  Overview     Diarrhea is loose, watery stools (bowel movements). The exact cause is often hard to find. Sometimes diarrhea is your body's way of getting rid of what caused an upset stomach. Viruses, food poisoning, and many medicines can cause diarrhea. Some people get diarrhea  in response to emotional stress, anxiety, orcertain foods.  Almost everyone has diarrhea now and then. It usually isn't serious, and your stools will return to normal soon. The important thing to do is replace thefluids you have lost, so you can prevent dehydration.  The doctor has checked you carefully, but problems can develop later. If you notice any problems or new symptoms, get medical treatment right away.  Follow-up care is a key part of your treatment and safety. Be sure to make and go to all appointments, and call your doctor if you are having problems. It's also a good  idea to know your test results and keep alist of the medicines you take.  How can you care for yourself at home?  ??? Watch for signs of dehydration, which means your body has lost too much water. Dehydration is a serious condition and should be treated right away. Signs of dehydration are:  ? Increasing thirst and dry eyes and mouth.  ? Feeling faint or lightheaded.  ? A smaller amount of urine than normal.  ??? To prevent dehydration, drink plenty of fluids. Choose water and other clear liquids until you feel better. If you have kidney, heart, or liver disease and have to limit fluids, talk with your doctor before you increase the amount of fluids you drink.  ??? When you feel like eating, start with small amounts of food.  ??? The doctor may recommend that you take over-the-counter medicine, such as loperamide (Imodium). Read and follow all instructions on the label. Do not use this medicine if you have bloody diarrhea, a high fever, or other signs of serious illness. Call your doctor if you think you are having a problem with your medicine.  When should you call for help?   Call 911 anytime you think you may need emergency care. For example, call if:  ?? ??? You passed out (lost consciousness).   ?? ??? Your stools are maroon or very bloody.   Call your doctor now or seek immediate medical care if:  ?? ??? You are dizzy or lightheaded, or you feel like you may faint.   ?? ??? Your stools are black and look like tar, or they have streaks of blood.   ?? ??? You have new or worse belly pain.   ?? ??? You have symptoms of dehydration, such as:  ? Dry eyes and a dry mouth.  ? Passing only a little urine.  ? Cannot keep fluids down.   ?? ??? You have a new or higher fever.   Watch closely for changes in your health, and be sure to contact your doctor if:  ?? ??? Your diarrhea is getting worse.   ?? ??? You see pus in the diarrhea.   ?? ??? You are not getting better after 2 days (48 hours).   Where can you learn more?  Go to  [email or web address] and sign in to your MyChart account. Enter [phone] in the [place] box to learn more about "Diarrhea: Care Instructions."     If you do not have an account, please click on the "Sign Up Now" link.  Current as of: July 11, 2019??????????????????????????????Content Version: 13.2  ?? 2006-2022 Healthwise, Incorporated.   Care instructions adapted under license by [facility]. If you have questions about a medical condition or this instruction, always ask your healthcare professional. [place] any warranty or liability for your use of this information.  Patient Education        Lead: About This Test  What is it?  This test measures the amount of lead in your blood. It is usually done on blood taken from a vein in your arm. Too much lead in your blood can cause astomachache, muscle weakness, and tiredness.  Why is this test done?  Testing for lead is done to:  ??? Diagnose lead poisoning.  ??? See how well treatment for lead poisoning is working.  ??? Look for lead poisoning in people who work with lead or lead products or who live in places where the chance of poisoning is high, such as in a large city.  ??? Check for the amount of lead in people who live with or play with children who have lead poisoning.  How do you prepare for the test?  ??? In general, there's nothing you have to do before this test, unless your doctor tells you to.  ??? Be sure to tell your doctor if you are using any herbal medicines.  How is the test done?  A health professional uses a needle to take a blood sample, usually from thearm.  What happens after the test?  ??? You will probably be able to go home right away.  ??? You can go back to your usual activities right away.  ??? Results are usually available within 1 week.  ??? If the test shows high levels of lead, your doctor will want you to have another test. How soon you will need to be retested will depend on how much lead is in your  blood. If the level of lead is only slightly high, you may be retested in a month. If it is very high, your doctor may want to repeat the test within a few days.  When should you call for help?  Watch closely for changes in your health, and be sure to contact your doctor ifyou have any problems.  Follow-up care is a key part of your treatment and safety. Be sure to make and go to all appointments, and call your doctor if you are having problems. It's also a good idea to keep a list of the medicines youtake. Ask your doctor when you can expect to have your test results.  Where can you learn more?  Go to [email or web address] and sign in to your MyChart account. Enter [phone] in the [place] box to learn more about "Lead: About This Test."     If you do not have an account, please click on the "Sign Up Now" link.  Current as of: June 27, 2019??????????????????????????????Content Version: 13.2  ?? 2006-2022 Healthwise, Incorporated.   Care instructions adapted under license by [facility]. If you have questions about a medical condition or this instruction, always ask your healthcare professional. [place] any warranty or liability for your use of this information.

## 2020-06-15 NOTE — Other (Signed)
Please notify patient of normal lead level results. No new changes or recommendations at this time.

## 2020-06-15 NOTE — Progress Notes (Signed)
[facility] A department of [facility]  [address]  [place] [address]  Phone: [phone]  Fax: [phone]      Katie Juarez is a 80 y.o. female who presents to the [facility] today for her medical conditions/complaints as noted below. Katie Juarez is c/o of Flank Pain ( burining in genital area leg cramps and pain at night )          HPI:     Multiple vague complaints with symptoms that have been present more than a month. 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  She also complains of flank pain that is worse on the right side. Noticed since December 2021. Had back surgery about 2 years ago. The pain comes and goes and depends of activity throughout the day.  Muscle cramps in both upper legs (thigh area) that is worse at night. She takes miralax daily and stools have been loose lately. Has had more liquid stools and then softer the past several weeks. She reports that after eating a larger meal she will have a liquid stool within 10 minutes.    She drinks on average 2-3 glasses of water per day (one of those being her miralax).   History of bowel obstruction with resection of  About 12 inches that was performed 2-3 years ago. Has not had colonoscopy since procedure that she recalls. Surgery done in [place].  Genital discomfort patient states is most likely irritation from frequent loose stools and wiping with paper. Burns occasionally when urinating in specific location. Has used hemorrhoid cream with some noticeable improvement in discomfort.          Flank Pain  This is a chronic (On the right side) problem. Episode onset: December 2021. The problem occurs daily. The problem has been waxing and waning since onset. The pain is present in the lumbar spine. The quality of the pain is described as aching. The pain does not radiate. Worse during: Depends of activities and bending. The symptoms are  aggravated by bending. Stiffness is present all day. Pertinent negatives include no abdominal pain, bladder incontinence, bowel incontinence, dysuria, fever, numbness, tingling or weakness. Risk factors include menopause, obesity and poor posture. She has tried ice and NSAIDs for the symptoms. The treatment provided mild relief.       Past Medical History:   Diagnosis Date   ??? 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 Breast cancer ([place]) 1999    in remission   ??? Constipation 12/27/2018   ??? COPD (chronic obstructive pulmonary disease) ([place])    ??? DDD (degenerative disc disease)    ??? Large bowel obstruction ([place]) 02/2015    [facility]   ??? LBP (low back pain)    ??? Pharyngoesophageal dysphagia    ??? SS (spinal stenosis)         Allergies   Allergen Reactions   ??? Morphine Other (See Comments)     Night sweats,felt "loopy".   ??? Pcn [Penicillins] Hives   ??? Zithromax [Azithromycin] Hives   ??? Adhesive Tape Rash       Wt Readings from Last 3 Encounters:   06/15/20 138 lb (62.6 kg)   05/04/20 137 lb 3.2 oz (62.2 kg)   01/02/20 134 lb 3.2 oz (60.9 kg)     BP Readings  from Last 3 Encounters:   06/15/20 110/64   05/04/20 116/80   01/02/20 118/72      Temp Readings from Last 3 Encounters:   06/15/20 98.2 ??F (36.8 ??C) (Tympanic)   05/04/20 97.7 ??F (36.5 ??C) (Temporal)   12/04/19 97.5 ??F (36.4 ??C) (Temporal)     Pulse Readings from Last 3 Encounters:   06/15/20 70   05/04/20 62   01/02/20 59     SpO2 Readings from Last 3 Encounters:   06/15/20 95%   05/04/20 94%   01/02/20 95%       Subjective:      Review of Systems   Constitutional: Positive for appetite change (not eating as much. Fills quickly;) and fatigue. Negative for fever.   HENT: Negative.  Negative for congestion.    Eyes: Negative.    Respiratory: Negative.    Cardiovascular: Negative.    Gastrointestinal: Positive for diarrhea. Negative for abdominal distention, abdominal pain, bowel incontinence and constipation.   Endocrine: Negative.     Genitourinary: Positive for flank pain. Negative for bladder incontinence, decreased urine volume, difficulty urinating and dysuria.   Musculoskeletal: Positive for back pain. Negative for joint swelling.        Muscle cramps worse at night and worse with sitting longer periods.      Skin: Negative.  Negative for rash.   Allergic/Immunologic: Negative.    Neurological: Negative for tingling, weakness and numbness.   Hematological: Negative.    Psychiatric/Behavioral: Negative.    All other systems reviewed and are negative.      Objective:     Vitals:    06/15/20 1054   BP: 110/64   Site: Right Upper Arm   Position: Sitting   Cuff Size: Large Adult   Pulse: 70   Temp: 98.2 ??F (36.8 ??C)   TempSrc: Tympanic   SpO2: 95%   Weight: 138 lb (62.6 kg)     Body mass index is 23.69 kg/m??.    BP 110/64 (Site: Right Upper Arm, Position: Sitting, Cuff Size: Large Adult)    Pulse 70    Temp 98.2 ??F (36.8 ??C) (Tympanic)    Wt 138 lb (62.6 kg)    LMP  (LMP Unknown)    SpO2 95%    BMI 23.69 kg/m??   Physical Exam  Vitals and nursing note reviewed.   Constitutional:       General: She is not in acute distress.     Appearance: She is well-developed and normal weight. She is not ill-appearing.   HENT:      Head:      Comments:        Nose: Congestion present.      Mouth/Throat:      Lips: Pink.      Mouth: Mucous membranes are moist.      Pharynx: No oropharyngeal exudate or posterior oropharyngeal erythema.      Tonsils: No tonsillar exudate or tonsillar abscesses.      Comments: Moderate amount of mucus noted in posterior pharynx area.  Eyes:      Conjunctiva/sclera: Conjunctivae normal.      Pupils: Pupils are equal, round, and reactive to light.   Neck:      Thyroid: No thyroid mass, thyromegaly or thyroid tenderness.      Trachea: Trachea normal.   Cardiovascular:      Rate and Rhythm: Normal rate and regular rhythm.      Pulses: Normal pulses.      Heart sounds:  Normal heart sounds. No murmur heard.      Pulmonary:      Effort:  Pulmonary effort is normal. No respiratory distress.      Breath sounds: Normal breath sounds. No wheezing or rales.   Abdominal:      General: Bowel sounds are normal.      Palpations: Abdomen is soft.      Tenderness: There is right CVA tenderness (slight). There is no left CVA tenderness.   Musculoskeletal:         General: Normal range of motion.      Cervical back: Normal range of motion and neck supple.   Lymphadenopathy:      Head:      Right side of head: No tonsillar adenopathy.      Left side of head: No tonsillar adenopathy.      Cervical: No cervical adenopathy.      Right cervical: No superficial or posterior cervical adenopathy.     Left cervical: No superficial or posterior cervical adenopathy.   Skin:     General: Skin is warm and dry.      Capillary Refill: Capillary refill takes less than 2 seconds.      Findings: No rash.   Neurological:      General: No focal deficit present.      Mental Status: She is alert and oriented to person, place, and time. Mental status is at baseline.   Psychiatric:         Mood and Affect: Mood normal.         Behavior: Behavior normal.         Judgment: Judgment normal.      Comments: Forgetful per patient.           Assessment:      Diagnosis Orders   1. Muscle cramping  CBC with Auto Differential    Comprehensive Metabolic Panel    Culture, Urine   2. Chronic right-sided low back pain, unspecified whether sciatica present  Culture, Urine   3. Diarrhea, unspecified type  CBC with Auto Differential    Comprehensive Metabolic Panel    Culture, Urine   4. 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 Glucose, Ur 06/15/2020 NEGATIVE  NEGATIVE Final   ??? Bilirubin Urine 06/15/2020 NEGATIVE  NEGATIVE Final   ??? Ketones, Urine 06/15/2020 NEGATIVE  NEGATIVE Final   ??? Specific Gravity, UA 06/15/2020 1.020  1.010 - 1.025 Final   ??? Urine Hgb 06/15/2020  NEGATIVE  NEGATIVE Final   ??? pH, UA 06/15/2020 8.0* 5.0 - 6.0 Final   ??? Protein, UA 06/15/2020 NEGATIVE  NEGATIVE Final   ??? Urobilinogen, Urine 06/15/2020 Normal  Normal Final   ??? Nitrite, Urine 06/15/2020 NEGATIVE  NEGATIVE Final   ??? Leukocyte Esterase, Urine 06/15/2020 NEGATIVE  NEGATIVE Final   ??? - 06/15/2020        Final   ??? WBC, UA 06/15/2020 0 TO 4  0 - 4 /HPF Final   ??? RBC, UA 06/15/2020 0 TO 4  0 - 4 /HPF Final   ??? Epithelial Cells UA 06/15/2020 0 TO 4  0 - 5 /HPF Final   ??? Bacteria, UA 06/15/2020 3+* None Final   ??? Amorphous, UA 06/15/2020 2+* None Final  Plan:   Chronic right sided low back pain. Right sided worse since December. Continue to use Ice and motrin as needed. Also encouraged her to try heat.   Muscle cramps may be due to frequent loose stools and small amount of water intake. She was encouraged to increase water intake to stay well hydrated. May need electrolyte replacement due to liquid stools.  Patient was instructed to cut back on amount of Gwendolyn Lima taken daily. She should try 1/2 cap versus full cap.Will check CMP and CBC. Will also check lead level due to known lead exposure.    Will call patient with results and further recommendations. She was encouraged to follow up with PCP to discuss potential need for GI referral for frequent loose stools with history of colon resection. Colon surgery was done in [place].    Discussed exam, POCT findings, plan of care, and follow-up at length with patient.  Reviewed all prescribed and recommended medications, administration and side effects. Encouraged patient to follow up with PCP or return to the clinic for no improvement and or worsening of symptoms. Patient instructed to go to ER or call 911 if any difficulty breathing, shortness of breath, inability to swallow, hives or temp greater than 103 degrees. All questions were answered and they verbalized understanding and were agreeable with the plan.       Return if symptoms worsen or fail to  improve.        Electronically signed by Kathyrn Sheriff, APRN - CNP on 06/15/2020 at 11:52 AM

## 2020-06-16 LAB — LEAD, BLOOD: Lead: 2 ug/dL (ref 0–4)

## 2020-06-17 LAB — CULTURE, URINE: Culture: NO GROWTH

## 2020-06-30 ENCOUNTER — Encounter

## 2020-06-30 NOTE — Telephone Encounter (Signed)
Ar[facility] called requesting a refill of the below medication which has been pended for you:     Requested Prescriptions     Pending Prescriptions Disp Refills   ??? donepezil (ARICEPT) 5 MG tablet 30 tablet 1     Sig: Take 1 tablet by mouth nightly       Last Appointment Date: 05/06/2020  Next Appointment Date: 08/04/2020    Allergies   Allergen Reactions   ??? Morphine Other (See Comments)     Night sweats,felt "loopy".   ??? Pcn [Penicillins] Hives   ??? Zithromax [Azithromycin] Hives   ??? Adhesive Tape Rash

## 2020-07-01 ENCOUNTER — Encounter

## 2020-07-01 MED ORDER — DONEPEZIL HCL 5 MG PO TABS
5 MG | ORAL_TABLET | Freq: Every evening | ORAL | 3 refills | Status: DC
Start: 2020-07-01 — End: 2020-11-03

## 2020-07-01 NOTE — Telephone Encounter (Signed)
Duplicate

## 2020-07-06 NOTE — Telephone Encounter (Signed)
Patient is taking Aricept and patient states she is having increased side effects and wonders if she should stop taking it. Her symptoms include being tired, bad dreams, upset stomach and muscle cramps. Patient wants to know what she should do. Please advise

## 2020-07-06 NOTE — Telephone Encounter (Signed)
Spoke with pt, states these sx have been going on for about a month but wasn't sure if it was related to the medication or not. Per Dr. Renard Hamper, needs to discuss any med changes during OV, has upcoming OV on 7/26. Will call if any sooner cancellations. Advised pt to continue on medication unless her sx become too severe and intolerable and to call the office to let us know. Voiced understanding. Also instructed to go to UC for sooner eval if stomach issues get worse.

## 2020-07-15 ENCOUNTER — Ambulatory Visit: Admit: 2020-07-15 | Discharge: 2020-07-15 | Payer: MEDICARE | Attending: Family | Primary: Family Medicine

## 2020-07-15 DIAGNOSIS — R059 Cough, unspecified: Secondary | ICD-10-CM

## 2020-07-15 LAB — POCT COVID-19 RAPID, NAAT
Lot Number: 1074511
SARS-COV-2, RdRp gene: POSITIVE — AB

## 2020-07-15 MED ORDER — BENZONATATE 100 MG PO CAPS
100 MG | ORAL_CAPSULE | Freq: Three times a day (TID) | ORAL | 0 refills | Status: AC | PRN
Start: 2020-07-15 — End: 2020-07-25

## 2020-07-15 NOTE — Progress Notes (Signed)
[facility]             [place], [place]                        Telephone [phone]             Fax [phone]     Katie Juarez  07/28/1940  JSE:[id number]   Date of visit:  07/15/2020    Subjective:    Katie Juarez is a 80 y.o. Caucasian female who presents to [facility] today (07/15/2020) for evaluation of:    Chief Complaint   Patient presents with   ??? Cough     Patient reports symptoms started last Thursday, 1 day after covid exposure. Occassional SOB. Denies fever. Productive cough with yellow mucus.       Cough  This is a new problem. The current episode started in the past 7 days (07/09/20). The problem has been unchanged. The problem occurs every few minutes. The cough is productive of sputum (intermittent). Associated symptoms include chills, headaches (frontal, mild dull, intermittent), myalgias, postnasal drip and rhinorrhea (clear mucus). Pertinent negatives include no chest pain, fever, nasal congestion, rash, sore throat, shortness of breath or wheezing. Associated symptoms comments: fatigue. Nothing aggravates the symptoms. Treatments tried: tylenol, ibuprofen. The treatment provided mild relief.   [place]    She has the following problem list:  Patient Active Problem List   Diagnosis   ??? Lumbar facet joint syndrome ([place])   ??? 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 Pain of both sacroiliac joints   ??? Piriformis syndrome of left side   ??? Lumbar radicular pain   ??? Neural foraminal stenosis of lumbar spine   ??? Chronic low back pain   ??? Spinal stenosis, lumbar   ??? Diverticulitis   ??? Acquired absence of other specified parts of digestive tract   ??? Arthrodesis status   ??? Asymptomatic menopausal state   ??? Chronic obstructive pulmonary disease, unspecified ([place])   ??? Extravasation of  urine   ??? 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 Hyperlipidemia, unspecified   ??? Closed compression fracture of body of L1 vertebra ([facility])   ??? Lumbar burst fracture, open, initial encounter ([place])   ??? Pharyngoesophageal dysphagia   ??? Anxiety   ??? Tremors of nervous system   ??? Benign essential tremor   ??? Dizziness   ??? Memory problem   ??? Balance problems   ??? Chronic cerebral ischemia   ??? Neck stiffness   ??? Tingling sensation   ??? Left arm weakness   ??? Dysphagia   ??? Brain dysfunction   ??? Carotid stenosis, asymptomatic, bilateral   ??? Knee pain        Current medications are:  Current Outpatient Medications   Medication Sig Dispense Refill   ??? benzonatate (TESSALON) 100 MG capsule Take 1 capsule by mouth 3 times daily as needed for Cough 30 capsule 0   ??? donepezil (ARICEPT) 5 MG tablet Take 1 tablet by mouth nightly 30 tablet 3   ??? LORazepam (ATIVAN) 0.5 MG tablet Take 1 tablet by mouth nightly as needed for Anxiety for up to 90 days. 90 tablet 0   ??? Probiotic Acidophilus (FLORANEX) TABS Take 1 tablet by mouth daily     ???  polyethylene glycol (GLYCOLAX) 17 GM/SCOOP powder Take 17 g by mouth daily As needed.     ??? budesonide-formoterol (SYMBICORT) 160-4.5 MCG/ACT AERO Inhale 2 puffs into the lungs 2 times daily Rinse mouth or brush teeth after each use. 30.6 g 3   ??? alendronate (FOSAMAX) 70 MG tablet Take 1 tablet by mouth every 7 days 12 tablet 3   ??? citalopram (CELEXA) 20 MG tablet Take 1.5 tablets by mouth daily 135 tablet 1   ??? CRANBERRY PO Take by mouth     ??? Cholecalciferol (VITAMIN D3) 50 MCG (2000 UT) CAPS Take by mouth     ??? Handicap Placard MISC by Does not apply route Issue parking placard for person with disability; Applicant meets the qualifying disability criteria. Length of time expected to have disability.  Prescription expires in 5 years from issuing date. 1 each 0   ??? CALCIUM CARBONATE-VITAMIN D PO Take 600 mg by mouth 2 times daily Contains 600 mg Calcium & 800 IU Vitamin D3.     ??? Multiple  Vitamins-Minerals (THERAPEUTIC MULTIVITAMIN-MINERALS) tablet Take 1 tablet by mouth daily     ??? albuterol sulfate HFA (VENTOLIN HFA) 108 (90 Base) MCG/ACT inhaler Ventolin HFA 90 mcg/actuation aerosol inhaler   Inhale 2 puffs every 6 hours by inhalation route as needed.   ASTHMA     ??? docusate sodium (COLACE) 100 MG capsule Take 100 mg by mouth nightly      ??? polyvinyl alcohol-povidone (HYPOTEARS) 1.4-0.6 % ophthalmic solution Place 1-2 drops into both eyes as needed.     ??? aspirin 81 MG EC tablet Take 81 mg by mouth daily Indications: takes irregularly  (Patient not taking: Reported on 07/15/2020)       No current facility-administered medications for this visit.        She is allergic to morphine, pcn [penicillins], zithromax [azithromycin], and adhesive tape..    She  reports that she quit smoking about 32 years ago. She started smoking about 67 years ago. She has a 40.00 pack-year smoking history. She has never used smokeless tobacco.      Objective:    Vitals:    07/15/20 1214   BP: 130/80   Site: Left Upper Arm   Position: Sitting   Cuff Size: Large Adult   Pulse: 83   Temp: 99.8 ??F (37.7 ??C)   TempSrc: Temporal   SpO2: 95%   Weight: 138 lb 3.2 oz (62.7 kg)   Height: 5\' 4"  (1.626 m)     Body mass index is 23.72 kg/m??.    Review of Systems   Constitutional: Positive for chills. Negative for fever.   HENT: Positive for postnasal drip and rhinorrhea (clear mucus). Negative for congestion and sore throat.    Respiratory: Positive for cough. Negative for chest tightness, shortness of breath and wheezing.    Cardiovascular: Negative.  Negative for chest pain.   Gastrointestinal: Negative.    Musculoskeletal: Positive for myalgias.   Skin: Negative for rash.   Neurological: Positive for headaches (frontal, mild dull, intermittent).       Physical Exam  Vitals and nursing note reviewed.   Constitutional:       Appearance: She is well-developed.   HENT:      Head: Normocephalic.      Jaw: There is normal jaw occlusion.       Right Ear: Tympanic membrane, ear canal and external ear normal.      Left Ear: Tympanic membrane, ear canal and external ear  normal.      Nose: Rhinorrhea present. Rhinorrhea is clear.      Right Turbinates: Swollen (erythema).      Left Turbinates: Swollen (erythema).      Right Sinus: No maxillary sinus tenderness or frontal sinus tenderness.      Left Sinus: No maxillary sinus tenderness or frontal sinus tenderness.      Mouth/Throat:      Lips: Pink.      Mouth: Mucous membranes are moist.      Pharynx: Oropharynx is clear. Uvula midline.   Eyes:      Pupils: Pupils are equal, round, and reactive to light.   Cardiovascular:      Rate and Rhythm: Normal rate and regular rhythm.      Heart sounds: Normal heart sounds.   Pulmonary:      Effort: Pulmonary effort is normal.      Breath sounds: Normal breath sounds and air entry.      Comments: Dry cough noted  Musculoskeletal:      Cervical back: Normal range of motion and neck supple.   Lymphadenopathy:      Cervical: No cervical adenopathy.   Skin:     General: Skin is warm and dry.   Neurological:      General: No focal deficit present.      Mental Status: She is alert and oriented to person, place, and time.   Psychiatric:         Behavior: Behavior normal.         Thought Content: Thought content normal.       Assessment and Plan:    Office Visit on 07/15/2020   Component Date Value Ref Range Status   ??? SARS-COV-2, RdRp gene 07/15/2020 Positive* Negative Final   ??? Lot Number 07/15/2020 [id number]   Final   ??? QC Pass/Fail 07/15/2020 pass   Final          Diagnosis Orders   1. COVID-19     2. Cough  POCT COVID-19 Rapid, NAAT    benzonatate (TESSALON) 100 MG capsule     I recommend to wear a mask for 5 days while in public.     Take Tessalon as directed for cough. I also recommended Claritin for sinus symptoms. she was also encouraged to use tylenol for pain/fever. Increase water intake. Use cool mist humidifier at bedtime. Use nasal saline flush as needed. Good  hand hygiene. she was instructed to return if there is no improvement or symptoms worsen.     The use, risks, benefits, and side effects of prescribed or recommended medications were discussed. All questions were answered and the patient/caregiver voiced understanding.    No orders of the defined types were placed in this encounter.        Electronically signed by Darolyn Rua, APRN - CNP on 07/15/20 at 12:40 PM EDT

## 2020-07-15 NOTE — Patient Instructions (Signed)
Patient Education        Learning About Coronavirus (COVID-19)  What is coronavirus (COVID-19)?     COVID-19 is a disease caused by a type of coronavirus. This illness was firstfound in December 2019. It has since spread worldwide.  Coronaviruses are a large group of viruses. They cause the common cold. They also cause more serious illnesses like Middle East respiratory syndrome (MERS) and severe acute respiratory syndrome (SARS). COVID-19 is caused by a novelcoronavirus. That means it's a new type that has not been seen in people before.  What are the symptoms?  COVID-19 symptoms may include:  ??? Fever.  ??? Cough.  ??? Trouble breathing.  ??? Chills or repeated shaking with chills.  ??? Muscle and body aches.  ??? Headache.  ??? Sore throat.  ??? New loss of taste or smell.  ??? Vomiting.  ??? Diarrhea.  In severe cases, COVID-19 can cause pneumonia and make it hard to breathewithout help from a machine. It can cause death.  How is it diagnosed?  COVID-19 is diagnosed with a viral test. This may also be called a PCR test or antigen test. It looks for evidence of the virus in your breathing passages orlungs (respiratory system).  The test is most often done on a sample from the nose, throat, or lungs. It's sometimes done on a sample of saliva. One way a sample is collected is byputting a long swab into the back of your nose.  If you have questions about COVID-19 testing, ask your doctor or go to cdc.govto use the COVID-19 Viral Testing Tool.  How is it treated?  Mild cases of COVID-19 can be treated at home. Serious cases need treatment in the hospital. Treatment may include medicines, plus breathing support such as oxygen therapy or a ventilator. Some people may be placed on their belly tohelp their oxygen levels.  Treatments that may help people who have COVID-19 include:  Antiviral medicines.  These medicines treat viral infections.  Immune-based therapy.  These medicines help the immune system fight COVID-19. Examples include  monoclonal antibodies.  Blood thinners.  These medicines help prevent blood clots. People with severe illness are at risk for blood clots.  How can you protect yourself and others?  ??? Stay up to date on your COVID-19 vaccines.  ??? Avoid sick people, and stay away from others if you are sick.  ??? Stay at least 6 feet away from other people.  ??? Avoid crowds, especially inside.  ??? Get tested for COVID-19 before you have an indoor visit with people who don't live with you.  ??? Improve the airflow when you spend time indoors with people who don't live with you. If you can, open windows and doors. Or you can use a fan to blow air away from people and out a window.  ??? Cover your mouth with a tissue when you cough or sneeze.  ??? Wash your hands often, especially after you cough or sneeze. Use soap and water, and scrub for at least 20 seconds. If soap and water aren't available, use an alcohol-based hand sanitizer.  ??? Avoid touching your mouth, nose, and eyes.  Check the CDC website at cdc.gov for the most current information on how to protect yourself. And if you have questions, ask your doctor or go to cdc.govto use the COVID-19 Quarantine and Isolation Calculator.  Here are some other steps you may need to take.  ??? If you are not up to date on your COVID-19 vaccines:  ?   Wear a mask with the best fit, protection, and comfort for you. A mask can protect you even when others aren't wearing one. This might be especially important if you:  - Have certain health conditions.  - Live with someone who has a compromised immune system.  - Live with someone who is not up to date on their COVID-19 vaccines.  ??? If you have been exposed to the virus AND are not up to date on your COVID-19 vaccines:  ? Talk to your doctor as soon as you can. Your doctor might have you take medicine to help prevent serious illness.  ? Get a COVID-19 test. You may need to be tested more than once. And if your test is positive, follow the instructions  below.  ? Stay home. Try to separate from other people where you live. Don't go to school, work, or public areas.  ? Wear a well-fitting mask around other people for a full 10 days. Avoid travel, and stay away from people at high risk for serious illness.  ? Watch for symptoms.  ??? If you have been exposed AND either tested positive for COVID-19 in the last 90 days and have recovered or you are up to date on your COVID-19 vaccines:  ? Talk to your doctor as soon as you can. Your doctor may have you take medicine to help prevent serious illness.  ? Get a COVID-19 test. Wait 5 days after you were last exposed. You may need to be tested more than once. And if your test is positive, follow the instructions below.  ? Wear a well-fitting mask around other people for a full 10 days.  ? Avoid travel and stay away from people at high risk for serious illness.  ? Watch for symptoms.  ? If you tested positive for COVID-19 in the last 90 days and have not recovered, another COVID-19 test may not be needed.  ??? If you're sick or test positive for COVID-19:  ? Talk to your doctor as soon as you can. Your doctor may have you take medicine to help prevent serious illness.  ? Get a COVID-19 test unless you have already been tested. You may need to be tested more than once.  ? Stay home. Leave only if you need to get medical care.  ? If you were seriously ill or if you have a weakened immune system, you may need to isolate for several weeks.  ? For a full 10 days, wear a well-fitting mask whenever you're around other people.  ? Avoid travel and stay away from people at high risk for serious illness.  ? Limit contact with pets and people in your home. If possible, stay in a separate bedroom and use a separate bathroom.  ? Clean and disinfect your home every day. Use household cleaners and disinfectant wipes or sprays. Take special care to clean things that you touch with your hands.  How can you self-isolate when you have COVID-19?  If  you have COVID-19, there are things you can do to help avoid spreading thevirus to others.  ??? Stay home, and avoid contact with other people.  ??? Limit contact with people in your home. If possible, stay in a separate bedroom and use a separate bathroom.  ??? Wear a well-fitting mask when you are around other people.  ??? Avoid contact with pets and other animals.  ??? Cover your mouth and nose with a tissue when you cough or sneeze. Then throw it   in the trash right away.  ??? Wash your hands often, especially after you cough or sneeze. Use soap and water, and scrub for at least 20 seconds. If soap and water aren't available, use an alcohol-based hand sanitizer.  ??? Don't share personal household items. These include bedding, towels, cups and glasses, and eating utensils.  ??? Wash laundry in the warmest water allowed for the fabric type, and dry it completely. It's okay to wash other people's laundry with yours.  ??? Clean and disinfect your home. Use household cleaners and disinfectant wipes or sprays.  If you have questions, visit cdc.gov to check the Quarantine and IsolationCalculator.  When should you call for help?   Call 911 anytime you think you may need emergency care. For example, call if you have life-threatening symptoms, such as:  ?? ??? You have severe trouble breathing. (You can't talk at all.)   ?? ??? You have constant chest pain or pressure.   ?? ??? You are severely dizzy or lightheaded.   ?? ??? You are confused or can't think clearly.   ?? ??? You have pale, gray, or blue-colored skin or lips.   ?? ??? You pass out (lose consciousness) or are very hard to wake up.   ?? ??? You have loss of balance or trouble walking.   ?? ??? You have trouble seeing out of one or both eyes.   ?? ??? You have weakness or drooping on one side of the face.   ?? ??? You have weakness or numbness in an arm or a leg.   ?? ??? You have trouble speaking.   ?? ??? You have a severe headache.   ?? ??? You have a seizure.   Call your doctor now or seek immediate medical care  if:  ?? ??? You have moderate trouble breathing. (You can't speak a full sentence.)   ?? ??? You are coughing up blood.   ?? ??? You have signs of low blood pressure. These include feeling lightheaded; being too weak to stand; and having cold, pale, clammy skin.   Watch closely for changes in your health, and be sure to contact your doctor if:  ?? ??? Your symptoms get worse.   ?? ??? You are not getting better as expected.   ?? ??? You have new or worse symptoms of anxiety, depression, nightmares, or flashbacks.   Call before you go to the doctor's office. Follow their instructions. And wear a mask.  Where can you learn more?  Go to [email or web address] and sign in to your MyChart account. Enter C008 in the Search Health Information box to learn more about "Learning About Coronavirus (COVID-19)."     If you do not have an account, please click on the "Sign Up Now" link.  Current as of: Jun 06, 2020??????????????????????????????Content Version: 13.3  ?? 2006-2022 [employer].   Care instructions adapted under license by [facility]. If you have questions about a medical condition or this instruction, always ask your healthcare professional. [employer] disclaims any warranty or liability for your use of this information.

## 2020-07-15 NOTE — Progress Notes (Signed)
Patient had covid exposure Last Wednesday. Patient did not do at home covid testing.      Has had 3 covid vaccines.

## 2020-07-21 NOTE — Telephone Encounter (Signed)
Pt calling stating she was seen by JY in [place] for Covid, pt states she's feeling fine except congestion and questions if she should get an antibiotic, pt uses pended pharmacy, please advise at above number.

## 2020-07-22 ENCOUNTER — Encounter

## 2020-07-22 MED ORDER — DOXYCYCLINE HYCLATE 100 MG PO TABS
100 MG | ORAL_TABLET | Freq: Two times a day (BID) | ORAL | 0 refills | Status: DC
Start: 2020-07-22 — End: 2021-01-05

## 2020-07-22 NOTE — Telephone Encounter (Signed)
Spoke with pt, advised ATB went to [facility]. Advised to follow up with PCP if sx worsen or persist.  Voiced understanding.

## 2020-07-22 NOTE — Telephone Encounter (Signed)
Doxycyline was sent to [facility]. Follow up with PCP if symptoms persist or worsen.

## 2020-07-25 LAB — CBC WITH AUTO DIFFERENTIAL
Basophils %: 0.8538 (ref 0.0–3.0)
Basophils Absolute: 0.098 (ref 0.0–0.3)
Eosinophils %: 2.441 (ref 0.0–10.0)
Eosinophils Absolute: 0.2803 (ref 0.0–1.1)
Hematocrit: 44.2 % (ref 37.0–47.0)
Hemoglobin: 14.6 (ref 12.0–16.0)
Lymphocyte %: 14.55 — ABNORMAL LOW (ref 20–51.1)
Lymphocytes Absolute: 1.67 (ref 1.0–5.5)
MCH: 32.6 pg — ABNORMAL HIGH (ref 28.5–32.5)
MCHC: 33 g/dL (ref 32.0–37.0)
MCV: 98.8 fl — ABNORMAL HIGH (ref 80.0–94.0)
Monocytes %: 7.885 (ref 1.7–9.3)
Monocytes Absolute: 0.9054 (ref 0.1–1.0)
Neutrophils %: 74.27 (ref 42.2–75.2)
Neutrophils Absolute: 8.528 — ABNORMAL HIGH (ref 2.0–8.1)
Platelets: 221.4 10*3/uL (ref 130–400)
RBC: 4.47 M/uL (ref 4.20–5.40)
RDW: 12.8 % (ref 8.5–15.5)
WBC: 11.5 Thou/mL3 — ABNORMAL HIGH (ref 4.8–10.8)

## 2020-07-25 LAB — COMPREHENSIVE METABOLIC PANEL
ALT: 19 units/L (ref 4–35)
AST: 27 units/L (ref 14–36)
Albumin/Globulin Ratio: 1.4 g/dL
Albumin: 4.2 g/dL (ref 3.5–5.0)
Alkaline Phosphatase: 72 units/L (ref 38–126)
Anion Gap: 4.6 mmol/L
BUN: 17 mg/dL (ref 7–17)
CO2: 29 mmol/L (ref 22–31)
Calcium: 9.7 mg/dL (ref 8.4–10.2)
Chloride: 105 mmol/L (ref 98–120)
Creatinine: 0.8 mg/dL (ref 0.5–1.0)
Gfr Calculated: >60
Globulin: 2.9 g/dL
Glucose: 96 mg/dL (ref 65–105)
Potassium: 4 mmol/L (ref 3.6–5.0)
Sodium: 139 mmol/L (ref 135–145)
Total Bilirubin: 0.5 mg/dL (ref 0.2–1.3)
Total Protein, Serum: 7.1 g/dL (ref 6.3–8.2)

## 2020-08-04 ENCOUNTER — Ambulatory Visit: Admit: 2020-08-04 | Discharge: 2020-08-04 | Payer: MEDICARE | Attending: Family Medicine | Primary: Family Medicine

## 2020-08-04 ENCOUNTER — Inpatient Hospital Stay: Admit: 2020-08-04 | Payer: MEDICARE | Primary: Family Medicine

## 2020-08-04 DIAGNOSIS — M25512 Pain in left shoulder: Secondary | ICD-10-CM

## 2020-08-04 DIAGNOSIS — R413 Other amnesia: Secondary | ICD-10-CM

## 2020-08-04 NOTE — Progress Notes (Signed)
[facility]  [address] [place] [address]  [phone]      Katie Juarez is a 80 y.o. female who presents today for her medical conditions/complaints as noted below.  Katie Juarez is c/o of Memory Loss (F/u), Tremors (F/u), and Fatigue      HPI:     Pt here today for follow-up of memory loss.    Since starting Aricept, she is having intermittent nights with very vivid dreams; does feel like it's getting better.  Feels like she is not sleeping as much or as restfully due to this; has trouble going back to sleep.    Spaces out the Aricept from her Ativan, maybe 60 mins apart    Has to take a nap almost every day - sometimes up to 2 hours.  Sometimes just lies there and rests, but she cannot fall asleep.    Just had Covid on 7/6 - still feels very tired and has no smell/taste.    Feels her memory is stable, maybe a little better since last OV  Still forgetting names, but not appt's  Not losing things or getting lost with driving  Daughter has not noticed any change per pt    Tremor still has good/bad days  Both hands and head; can notice this most days and daughter has pointed out head tremor    Now taking Vit C and other vitamins per daughter.    BP well-controlled today - 118/80.    Has been having L shoulder pain x past couple months.  Sometimes it radiates down into her L upper chest.  Pain over front and back of shoulder; radiates down into upper arm once in awhile (achy pain).  No swelling.  Has trouble with ROM.  Has not tried anything at home, other than stretches.        Past Medical History:   Diagnosis Date    Basal cell carcinoma (BCC) of face 10/2018    excised by Dr Yehuda Savannah     Breast cancer [facility]) 1999    in remission    Constipation 12/27/2018    COPD (chronic obstructive pulmonary disease) ([place])     DDD (degenerative disc disease)     Large bowel obstruction ([place]) 02/2015    [facility]    LBP (low back pain)     Pharyngoesophageal dysphagia     SS  (spinal stenosis)       Past Surgical History:   Procedure Laterality Date    [place]    BREAST LUMPECTOMY Right 1999    CHOLECYSTECTOMY  1990    EXCISION/BIOPSY Left 11/01/2018    Dr Yehuda Savannah [name], for [place] Left 02/20/2015    strangulated with small bowel resection - [facility],  [facility] [place])  1980    Done for endometriosis; done in [place]  01/03/2019    VERTEBRAL AUMENTATION  L1      OTHER SURGICAL HISTORY  11/14/13, 06/23/11, 06/30/11, 07/12/11 12/29/11    L4/L5 IESI    OTHER SURGICAL HISTORY  12/17/2013    L5/S1 IESI    OTHER SURGICAL HISTORY Bilateral 05/30/2014    SIJ and piriformis    OTHER SURGICAL HISTORY Bilateral 09/16/2014    L3, L4, L5 Diagnostic Medial Branch Block    OTHER SURGICAL  HISTORY Bilateral 09/26/2014    L5 TFE    OTHER SURGICAL HISTORY  10/10/2014    bil L5 TFE    OTHER SURGICAL HISTORY  10/31/2014    caudal    SMALL INTESTINE SURGERY  02/20/2015    strangulated left femoral hernia    SPINE SURGERY N/A 01/03/2019    VERTEBRAL AUMENTATION  L1  (C-ARM X2, performed by Loura Back, MD at [place] N/A 04/24/2019    EGD BIOPSY performed by Monna Fam, MD at [facility] OR     Family History   Problem Relation Age of Onset    Other Mother         ALS - diagnosed at age 42    Stroke Father         age 2    Cancer Sister         pt thinks it was liver    Cancer Brother         leukemia    Diabetes Maternal Grandfather     Other Brother         drowned    Other Brother         died at age 44; think due to spinal meningitis    Lupus Sister     Alzheimer's Disease Sister     Dementia Sister     Mult Sclerosis Nephew     Mult Sclerosis Daughter      Social History     Tobacco Use    Smoking status: Former     Packs/day: 1.00     Years: 40.00     Pack years: 40.00     Types: Cigarettes     Start  date: 01/10/1953     Quit date: 01/11/1988     Years since quitting: 32.6    Smokeless tobacco: Never    Tobacco comments:     quit 25 years ago 1990   Substance Use Topics    Alcohol use: No     Alcohol/week: 0.0 standard drinks      Current Outpatient Medications   Medication Sig Dispense Refill    donepezil (ARICEPT) 5 MG tablet Take 1 tablet by mouth nightly 30 tablet 3    LORazepam (ATIVAN) 0.5 MG tablet Take 1 tablet by mouth nightly as needed for Anxiety for up to 90 days. 90 tablet 0    Probiotic Acidophilus (FLORANEX) TABS Take 1 tablet by mouth daily      polyethylene glycol (GLYCOLAX) 17 GM/SCOOP powder Take 17 g by mouth daily As needed.      budesonide-formoterol (SYMBICORT) 160-4.5 MCG/ACT AERO Inhale 2 puffs into the lungs 2 times daily Rinse mouth or brush teeth after each use. 30.6 g 3    alendronate (FOSAMAX) 70 MG tablet Take 1 tablet by mouth every 7 days 12 tablet 3    citalopram (CELEXA) 20 MG tablet Take 1.5 tablets by mouth daily 135 tablet 1    CRANBERRY PO Take by mouth      Cholecalciferol (VITAMIN D3) 50 MCG (2000 UT) CAPS Take by mouth      Handicap Placard MISC by Does not apply route Issue parking placard for person with disability; Applicant meets the qualifying disability criteria. Length of time expected to have disability.  Prescription expires in 5 years from issuing date. 1 each 0    CALCIUM CARBONATE-VITAMIN D PO Take 600 mg by mouth 2  times daily Contains 600 mg Calcium & 800 IU Vitamin D3.      Multiple Vitamins-Minerals (THERAPEUTIC MULTIVITAMIN-MINERALS) tablet Take 1 tablet by mouth daily      albuterol sulfate HFA (PROVENTIL;VENTOLIN;PROAIR) 108 (90 Base) MCG/ACT inhaler Ventolin HFA 90 mcg/actuation aerosol inhaler   Inhale 2 puffs every 6 hours by inhalation route as needed.   ASTHMA      polyvinyl alcohol-povidone (HYPOTEARS) 1.4-0.6 % ophthalmic solution Place 1-2 drops into both eyes as needed.       No current facility-administered medications for this visit.      Allergies   Allergen Reactions    Morphine Other (See Comments)     Night sweats,felt "loopy".    Pcn [Penicillins] Hives    Zithromax [Azithromycin] Hives    Adhesive Tape Rash       Health Maintenance   Topic Date Due    COVID-19 Vaccine (4 - Booster for Moderna series) 03/20/2020    Flu vaccine (1) 09/10/2020    Annual Wellness Visit (AWV)  05/07/2021    Depression Screen  08/04/2021    DTaP/Tdap/Td vaccine (2 - Td or Tdap) 09/28/2027    DEXA (modify frequency per FRAX score)  Completed    Shingles vaccine  Completed    Pneumococcal 65+ years Vaccine  Completed    Hepatitis A vaccine  Aged Out    Hepatitis B vaccine  Aged Out    Hib vaccine  Aged Out    Meningococcal (ACWY) vaccine  Aged Out       Subjective:      Review of Systems   Constitutional:  Positive for fatigue. Unexpected weight change: down 2 lbs since her last OV.  Respiratory:  Negative for cough (resolved) and shortness of breath (does not have to use Albuterol inhaler very often).    Psychiatric/Behavioral:  Positive for confusion (memory loss stable).      Objective:     Vitals:    08/04/20 1130   BP: 118/80   Site: Right Upper Arm   Position: Sitting   Cuff Size: Medium Adult   Pulse: 54   Temp: 97.2 ??F (36.2 ??C)   TempSrc: Temporal   SpO2: 95%   Weight: 135 lb 12.8 oz (61.6 kg)   Height: '5\' 4"'$  (1.626 m)     Physical Exam  Constitutional:       General: She is not in acute distress.     Appearance: Normal appearance.   HENT:      Head: Normocephalic and atraumatic.   Eyes:      Conjunctiva/sclera: Conjunctivae normal.   Cardiovascular:      Rate and Rhythm: Normal rate and regular rhythm.      Heart sounds: Normal heart sounds.   Pulmonary:      Effort: Pulmonary effort is normal. No respiratory distress.      Breath sounds: Normal breath sounds.   Abdominal:      General: Bowel sounds are normal. There is no distension.      Palpations: Abdomen is soft.      Tenderness: There is no abdominal tenderness.   Musculoskeletal:      Right lower  leg: No edema.      Left lower leg: No edema.   Skin:     General: Skin is warm and dry.   Neurological:      General: No focal deficit present.      Mental Status: She is alert and oriented to person, place, and time.  Motor: Tremor present.   Psychiatric:         Mood and Affect: Mood normal.       Assessment:      1. Memory problem  2. Tremor  3. Acute pain of left shoulder  -     XR SHOULDER LEFT (MIN 2 VIEWS); Future  4. Anxiety       Plan:      Return in about 3 months (around 11/04/2020) for f/u memory, tremor, shoulder.    Orders Placed This Encounter   Procedures    XR SHOULDER LEFT (MIN 2 VIEWS)     Standing Status:   Future     Number of Occurrences:   1     Standing Expiration Date:   08/04/2021     Order Specific Question:   Reason for exam:     Answer:   L shoulder pain x past 2 months; no known injury; decreased ROM     No orders of the defined types were placed in this encounter.      Patient given educational materials - see patient instructions.  Discussed use, benefit, and side effects of prescribed medications.  All patient questions answered.  Pt voiced understanding.  Reviewed health maintenance.            Electronically signed by [name], DO, DO on 08/16/2020 at 11:40 PM

## 2020-08-11 NOTE — Telephone Encounter (Signed)
Patient called regarding xray results on her shoulder that was done on 08/04/20. Advised patient that Kb has not commented her recommendation yet, but per xay results, "shows no evidence of acute fracture or dislocation. Degenerative changes in the [facility] joint. Advised patient that if KB has any recommendations, office will call her with those. Patient verbalized understanding.

## 2020-09-01 ENCOUNTER — Encounter

## 2020-09-01 MED ORDER — CITALOPRAM HYDROBROMIDE 20 MG PO TABS
20 MG | ORAL_TABLET | ORAL | 0 refills | Status: DC
Start: 2020-09-01 — End: 2020-10-01

## 2020-09-01 NOTE — Telephone Encounter (Signed)
Patient calling seeing if can be done today?  She is completely out

## 2020-09-01 NOTE — Telephone Encounter (Signed)
30 day supply pended. PCP out.    Ulrica called requesting a refill of the below medication which has been pended for you:     Requested Prescriptions     Pending Prescriptions Disp Refills    citalopram (CELEXA) 20 MG tablet [Pharmacy Med Name: Citalopram Hydrobromide 20 MG Oral Tablet] 45 tablet 0     Sig: TAKE 1 & 1/2 (ONE & ONE-HALF) TABLETS BY MOUTH ONCE DAILY       Last Appointment Date: 08/04/2020  Next Appointment Date: 11/06/2020    Allergies   Allergen Reactions    Morphine Other (See Comments)     Night sweats,felt "loopy".    Pcn [Penicillins] Hives    Zithromax [Azithromycin] Hives    Adhesive Tape Rash

## 2020-09-12 LAB — COMPREHENSIVE METABOLIC PANEL
ALT: 21 units/L (ref 4–35)
AST: 27 units/L (ref 14–36)
Albumin/Globulin Ratio: 1.3 g/dL
Albumin: 4.1 g/dL (ref 3.5–5.0)
Alkaline Phosphatase: 70 units/L (ref 38–126)
Anion Gap: 5.1 mmol/L
BUN: 17 mg/dL (ref 7–17)
CO2: 30 mmol/L (ref 22–31)
Calcium: 9.4 mg/dL (ref 8.4–10.2)
Chloride: 104 mmol/L (ref 98–120)
Creatinine: 0.9 mg/dL (ref 0.5–1.0)
Gfr Calculated: >60
Globulin: 3.3 g/dL
Glucose: 102 mg/dL (ref 65–105)
Potassium: 4.5 mmol/L (ref 3.6–5.0)
Sodium: 139 mmol/L (ref 135–145)
Total Bilirubin: 0.3 mg/dL (ref 0.2–1.3)
Total Protein, Serum: 7.4 g/dL (ref 6.3–8.2)

## 2020-09-12 LAB — URINALYSIS WITH MICROSCOPIC
Bilirubin Urine: NEGATIVE
Casts UA: ABSENT
Glucose, Ur: NEGATIVE mg/dL
Ketones, Urine: NEGATIVE mg/dL
Leukocyte Esterase, Urine: NEGATIVE
Nitrite, Urine: NEGATIVE
Specific Gravity, Urine: 1.025 mg/dL (ref 1.005–1.03)
Trichomonas, Urine: ABSENT
Urobilinogen, Urine: 0.2 mg/dL (ref 0.2–1.0)
Yeast, Urine: ABSENT
pH, UA: 5.5 (ref 5.0–8.5)

## 2020-09-12 LAB — CBC WITH AUTO DIFFERENTIAL
Basophils %: 0.8818 (ref 0.0–3.0)
Basophils Absolute: 0.1197 (ref 0.0–0.3)
Eosinophils %: 1.142 (ref 0.0–10.0)
Eosinophils Absolute: 0.155 (ref 0.0–1.1)
Hematocrit: 42.8 % (ref 37.0–47.0)
Hemoglobin: 13.3 (ref 12.0–16.0)
Lymphocyte %: 12.8 — ABNORMAL LOW (ref 20–51.1)
Lymphocytes Absolute: 1.736 (ref 1.0–5.5)
MCH: 31.3 pg (ref 28.5–32.5)
MCHC: 31.2 g/dL — ABNORMAL LOW (ref 32.0–37.0)
MCV: 100.3 fl — ABNORMAL HIGH (ref 80.0–94.0)
Monocytes %: 11.4 — ABNORMAL HIGH (ref 1.7–9.3)
Monocytes Absolute: 1.547 — ABNORMAL HIGH (ref 0.1–1.0)
Neutrophils %: 73.78 (ref 42.2–75.2)
Neutrophils Absolute: 10.01 — ABNORMAL HIGH (ref 2.0–8.1)
Platelets: 191.2 10*3/uL (ref 130–400)
RBC: 4.26 M/uL (ref 4.20–5.40)
RDW: 13.2 % (ref 8.5–15.5)
WBC: 13.6 Thou/mL3 — ABNORMAL HIGH (ref 4.8–10.8)

## 2020-09-12 LAB — LIPASE: Lipase: 167 units/L (ref 23–300)

## 2020-09-14 ENCOUNTER — Encounter

## 2020-09-15 MED ORDER — LORAZEPAM 0.5 MG PO TABS
0.5 MG | ORAL_TABLET | ORAL | 0 refills | Status: DC
Start: 2020-09-15 — End: 2020-11-27

## 2020-09-15 NOTE — Telephone Encounter (Signed)
Controlled Substance Monitoring:    Acute and Chronic Pain Monitoring:   RX Monitoring 09/15/2020   Periodic Controlled Substance Monitoring No signs of potential drug abuse or diversion identified.    Obtained or confirmed "Medication Contract" on file.

## 2020-09-15 NOTE — Telephone Encounter (Signed)
[place], last fill 06/13/20, quantity 90 for 90 days.      Dorys called requesting a refill of the below medication which has been pended for you:     Requested Prescriptions     Pending Prescriptions Disp Refills    LORazepam (ATIVAN) 0.5 MG tablet [Pharmacy Med Name: LORazepam 0.5 MG Oral Tablet] 90 tablet 0     Sig: TAKE 1 TABLET BY MOUTH NIGHTLY FOR ANXIETY       Last Appointment Date: 08/04/2020  Next Appointment Date: 09/15/2020    Allergies   Allergen Reactions    Morphine Other (See Comments)     Night sweats,felt "loopy".    Pcn [Penicillins] Hives    Zithromax [Azithromycin] Hives    Adhesive Tape Rash

## 2020-09-15 NOTE — Telephone Encounter (Signed)
Pt requesting this to be sent to pharmacy by 3:30 as her son needs to pick up.

## 2020-09-15 NOTE — Telephone Encounter (Signed)
Patient requesting refill to loaded pharmacy.

## 2020-09-30 ENCOUNTER — Encounter

## 2020-09-30 NOTE — Telephone Encounter (Signed)
Patient requesting refill to loaded pharmacy.

## 2020-10-01 ENCOUNTER — Encounter

## 2020-10-01 MED ORDER — CITALOPRAM HYDROBROMIDE 20 MG PO TABS
20 MG | ORAL_TABLET | Freq: Every day | ORAL | 1 refills | Status: DC
Start: 2020-10-01 — End: 2021-04-13

## 2020-10-01 NOTE — Telephone Encounter (Signed)
Formatting of this note is different from the original.  Patient states she has enough for tonight 10/01/20. Will need refill for tomorrow.     Natoshia called requesting a refill of the below medication which has been pended for you:     Requested Prescriptions     Pending Prescriptions Disp Refills    citalopram (CELEXA) 20 MG tablet 45 tablet 0     Last Appointment Date: 08/04/2020  Next Appointment Date: 11/06/2020    Allergies   Allergen Reactions    Morphine Other (See Comments)     Night sweats,felt "loopy".    Pcn [Penicillins] Hives    Zithromax [Azithromycin] Hives    Adhesive Tape Rash     Electronically signed by Interface, Incoming [facility] Source Conversion at 10/30/2020  3:37 AM EDT

## 2020-10-01 NOTE — Telephone Encounter (Signed)
Patient states she has enough for tonight 10/01/20. Will need refill for tomorrow.       Raelle called requesting a refill of the below medication which has been pended for you:     Requested Prescriptions     Pending Prescriptions Disp Refills    citalopram (CELEXA) 20 MG tablet 45 tablet 0       Last Appointment Date: 08/04/2020  Next Appointment Date: 11/06/2020    Allergies   Allergen Reactions    Morphine Other (See Comments)     Night sweats,felt "loopy".    Pcn [Penicillins] Hives    Zithromax [Azithromycin] Hives    Adhesive Tape Rash

## 2020-11-02 ENCOUNTER — Encounter

## 2020-11-02 NOTE — Telephone Encounter (Signed)
Pt has 2 pills left.

## 2020-11-02 NOTE — Telephone Encounter (Signed)
Katie Juarez called requesting a refill of the below medication which has been pended for you:     Requested Prescriptions     Pending Prescriptions Disp Refills    donepezil (ARICEPT) 5 MG tablet 30 tablet 3     Sig: Take 1 tablet by mouth nightly       Last Appointment Date: 08/04/2020  Next Appointment Date: 11/06/2020    Allergies   Allergen Reactions    Morphine Other (See Comments)     Night sweats,felt "loopy".    Pcn [Penicillins] Hives    Zithromax [Azithromycin] Hives    Adhesive Tape Rash

## 2020-11-03 MED ORDER — DONEPEZIL HCL 5 MG PO TABS
5 MG | ORAL_TABLET | Freq: Every evening | ORAL | 1 refills | Status: DC
Start: 2020-11-03 — End: 2021-02-02

## 2020-11-06 ENCOUNTER — Ambulatory Visit: Admit: 2020-11-06 | Discharge: 2020-11-06 | Payer: MEDICARE | Attending: Family Medicine | Primary: Family Medicine

## 2020-11-06 ENCOUNTER — Inpatient Hospital Stay: Admit: 2020-11-06 | Payer: MEDICARE | Primary: Family Medicine

## 2020-11-06 DIAGNOSIS — E559 Vitamin D deficiency, unspecified: Secondary | ICD-10-CM

## 2020-11-06 DIAGNOSIS — K219 Gastro-esophageal reflux disease without esophagitis: Secondary | ICD-10-CM

## 2020-11-06 MED ORDER — PREDNISONE 20 MG PO TABS
20 MG | ORAL_TABLET | Freq: Every day | ORAL | 0 refills | Status: DC
Start: 2020-11-06 — End: 2021-01-05

## 2020-11-06 MED ORDER — OMEPRAZOLE 20 MG PO CPDR
20 MG | ORAL_CAPSULE | Freq: Every day | ORAL | 0 refills | Status: DC
Start: 2020-11-06 — End: 2020-11-27

## 2020-11-06 NOTE — Progress Notes (Signed)
[facility]  [address] [place] [address]  [phone]      Katie Juarez is a 80 y.o. female who presents today for her medical conditions/complaints as noted below.  Katie Juarez is c/o of Tremors, Bloated, and Memory Loss (F/u memory)      HPI:     Pt here today for follow-up of memory, anxiety, and tremor.    Has tried Maalox a couple times, but it did not help much  Feels full faster  Bloated in epigastric area with increased gas    Takes Miralax once daily or stool softener daily - does not take both in one day.  Feels the stool softener works better of the two.  Also taking probiotic daily.    Passing more gas (lower)  Has had a little heartburn; feels "full"      Past Medical History:   Diagnosis Date    Basal cell carcinoma (BCC) of face 10/2018    excised by Dr Yehuda Savannah     Breast cancer [facility]) 1999    in remission    Constipation 12/27/2018    COPD (chronic obstructive pulmonary disease) ([place])     DDD (degenerative disc disease)     Large bowel obstruction ([place]) 02/2015    [facility]    LBP (low back pain)     Pharyngoesophageal dysphagia     SS (spinal stenosis)       Past Surgical History:   Procedure Laterality Date    [place]    BREAST LUMPECTOMY Right 1999    CHOLECYSTECTOMY  1990    EXCISION/BIOPSY Left 11/01/2018    Dr Yehuda Savannah / [name], for [place] Left 02/20/2015    strangulated with small bowel resection - [facility],  [facility] [place] ([place])  1980    Done for endometriosis; done in [place]  01/03/2019    [place]  11/14/13, 06/23/11, 06/30/11, 07/12/11 12/29/11    L4/L5 IESI    OTHER SURGICAL HISTORY  12/17/2013    L5/S1 IESI    OTHER SURGICAL HISTORY Bilateral 05/30/2014    SIJ and piriformis    OTHER SURGICAL HISTORY Bilateral 09/16/2014    L3, L4, L5 Diagnostic Medial  Branch Block    OTHER SURGICAL HISTORY Bilateral 09/26/2014    L5 TFE    OTHER SURGICAL HISTORY  10/10/2014    bil L5 TFE    OTHER SURGICAL HISTORY  10/31/2014    caudal    SMALL INTESTINE SURGERY  02/20/2015    strangulated left femoral hernia    SPINE SURGERY N/A 01/03/2019    VERTEBRAL AUMENTATION  L1  (C-ARM X2, performed by Loura Back, MD at [place] N/A 04/24/2019    EGD BIOPSY performed by Monna Fam, MD at [place] History   Problem Relation Age of Onset    Other Mother         ALS - diagnosed at age 28    Stroke Father         age 47    Cancer Sister         pt thinks it was liver  Cancer Brother         leukemia    Diabetes Maternal Grandfather     Other Brother         drowned    Other Brother         died at age 31; think due to spinal meningitis    Lupus Sister     Alzheimer's Disease Sister     Dementia Sister     Mult Sclerosis Nephew     Mult Sclerosis Daughter      Social History     Tobacco Use    Smoking status: Former     Packs/day: 1.00     Years: 40.00     Pack years: 40.00     Types: Cigarettes     Start date: 01/10/1953     Quit date: 01/11/1988     Years since quitting: 32.8    Smokeless tobacco: Never    Tobacco comments:     quit 25 years ago 1990   Substance Use Topics    Alcohol use: No     Alcohol/week: 0.0 standard drinks      Current Outpatient Medications   Medication Sig Dispense Refill    omeprazole (PRILOSEC) 20 MG delayed release capsule Take 1 capsule by mouth every morning (before breakfast) 30 capsule 0    donepezil (ARICEPT) 5 MG tablet Take 1 tablet by mouth nightly 90 tablet 1    citalopram (CELEXA) 20 MG tablet Take 1.5 tablets by mouth daily 135 tablet 1    LORazepam (ATIVAN) 0.5 MG tablet TAKE 1 TABLET BY MOUTH NIGHTLY FOR ANXIETY 90 tablet 0    Probiotic Acidophilus (FLORANEX) TABS Take 1 tablet by mouth daily      polyethylene glycol (GLYCOLAX) 17 GM/SCOOP powder Take 17 g by mouth daily As  needed.      budesonide-formoterol (SYMBICORT) 160-4.5 MCG/ACT AERO Inhale 2 puffs into the lungs 2 times daily Rinse mouth or brush teeth after each use. 30.6 g 3    alendronate (FOSAMAX) 70 MG tablet Take 1 tablet by mouth every 7 days 12 tablet 3    CRANBERRY PO Take by mouth      Cholecalciferol (VITAMIN D3) 50 MCG (2000 UT) CAPS Take by mouth      Handicap Placard MISC by Does not apply route Issue parking placard for person with disability; Applicant meets the qualifying disability criteria. Length of time expected to have disability.  Prescription expires in 5 years from issuing date. 1 each 0    CALCIUM CARBONATE-VITAMIN D PO Take 600 mg by mouth 2 times daily Contains 600 mg Calcium & 800 IU Vitamin D3.      Multiple Vitamins-Minerals (THERAPEUTIC MULTIVITAMIN-MINERALS) tablet Take 1 tablet by mouth daily      albuterol sulfate HFA (PROVENTIL;VENTOLIN;PROAIR) 108 (90 Base) MCG/ACT inhaler Ventolin HFA 90 mcg/actuation aerosol inhaler   Inhale 2 puffs every 6 hours by inhalation route as needed.   ASTHMA      polyvinyl alcohol-povidone (HYPOTEARS) 1.4-0.6 % ophthalmic solution Place 1-2 drops into both eyes as needed.       No current facility-administered medications for this visit.     Allergies   Allergen Reactions    Morphine Other (See Comments)     Night sweats,felt "loopy".    Pcn [Penicillins] Hives    Zithromax [Azithromycin] Hives    Adhesive Tape Rash       Health Maintenance   Topic Date Due    COVID-19 Vaccine (4 -  Booster for Moderna series) 01/16/2020    Annual Wellness Visit (AWV)  05/07/2021    Depression Screen  08/04/2021    DTaP/Tdap/Td vaccine (2 - Td or Tdap) 09/28/2027    DEXA (modify frequency per FRAX score)  Completed    Flu vaccine  Completed    Shingles vaccine  Completed    Pneumococcal 65+ years Vaccine  Completed    Hepatitis A vaccine  Aged Out    Hib vaccine  Aged Out    Meningococcal (ACWY) vaccine  Aged Out       Subjective:      Review of Systems   Musculoskeletal:   Positive for back pain (R-sided lower, radiates down her leg; taking Ibuprofen and/or Tylenol as needed. Pain is 4/10, achy pain).     Objective:     Vitals:    11/06/20 1204   BP: 118/84   Site: Right Upper Arm   Position: Sitting   Cuff Size: Medium Adult   Pulse: 57   Temp: 97.8 ??F (36.6 ??C)   TempSrc: Temporal   SpO2: 96%   Weight: 134 lb (60.8 kg)   Height: 5\' 4"  (1.626 m)     Physical Exam  Constitutional:       General: She is not in acute distress.     Appearance: Normal appearance.   HENT:      Head: Normocephalic and atraumatic.   Eyes:      Conjunctiva/sclera: Conjunctivae normal.   Cardiovascular:      Rate and Rhythm: Normal rate and regular rhythm.      Heart sounds: Normal heart sounds.   Pulmonary:      Effort: Pulmonary effort is normal. No respiratory distress.      Breath sounds: Normal breath sounds.   Abdominal:      General: Bowel sounds are normal. There is no distension.      Palpations: Abdomen is soft.      Tenderness: There is no abdominal tenderness.   Musculoskeletal:      Right lower leg: No edema.      Left lower leg: No edema.   Skin:     General: Skin is warm and dry.   Neurological:      General: No focal deficit present.      Mental Status: She is alert and oriented to person, place, and time.   Psychiatric:         Mood and Affect: Mood normal.       Assessment:      1. Gastro-esophageal reflux disease without esophagitis  2. Epigastric fullness  -     omeprazole (PRILOSEC) 20 MG delayed release capsule; Take 1 capsule by mouth every morning (before breakfast), Disp-30 capsule, R-0Normal  3. Pain of right sacroiliac joint  -     predniSONE (DELTASONE) 20 MG tablet; Take 2 tablets by mouth daily for 6 days, Disp-12 tablet, R-0Normal  4. Vitamin D deficiency  -     Vitamin D 25 Hydroxy; Future  5. Fatigue, unspecified type  6. Anxiety  7. Tremor  8. Essential hypertension       Plan:      Return in about 3 weeks (around 11/27/2020) for phone visit for f/u epigastric fullness/gas; OV  f/u on 12/27.    Orders Placed This Encounter   Procedures    Vitamin D 25 Hydroxy     Standing Status:   Future     Number of Occurrences:   1  Standing Expiration Date:   11/06/2021     Orders Placed This Encounter   Medications    omeprazole (PRILOSEC) 20 MG delayed release capsule     Sig: Take 1 capsule by mouth every morning (before breakfast)     Dispense:  30 capsule     Refill:  0    predniSONE (DELTASONE) 20 MG tablet     Sig: Take 2 tablets by mouth daily for 6 days     Dispense:  12 tablet     Refill:  0       Patient given educational materials - see patient instructions.  Discussed use, benefit, and side effects of prescribed medications.  All patient questions answered.  Pt voiced understanding.  Reviewed health maintenance.            Electronically signed by [name], DO, DO on 11/15/2020 at 11:45 PM

## 2020-11-07 LAB — VITAMIN D 25 HYDROXY: Vit D, 25-Hydroxy: 60.3 ng/mL (ref >29.9)

## 2020-11-27 ENCOUNTER — Telehealth

## 2020-11-27 ENCOUNTER — Telehealth: Admit: 2020-11-27 | Discharge: 2020-12-01 | Payer: MEDICARE | Attending: Family Medicine | Primary: Family Medicine

## 2020-11-27 DIAGNOSIS — R1906 Epigastric swelling, mass or lump: Secondary | ICD-10-CM

## 2020-11-27 MED ORDER — PRIMIDONE 50 MG PO TABS
50 MG | ORAL_TABLET | ORAL | 0 refills | Status: DC
Start: 2020-11-27 — End: 2020-11-30

## 2020-11-27 MED ORDER — LORAZEPAM 0.5 MG PO TABS
0.5 MG | ORAL_TABLET | Freq: Every evening | ORAL | 0 refills | Status: DC
Start: 2020-11-27 — End: 2021-03-10

## 2020-11-27 MED ORDER — OMEPRAZOLE 20 MG PO CPDR
20 MG | ORAL_CAPSULE | Freq: Every day | ORAL | 1 refills | Status: DC
Start: 2020-11-27 — End: 2021-03-03

## 2020-11-27 NOTE — Progress Notes (Signed)
Katie Juarez is a 80 y.o. female evaluated via telephone on 11/27/2020 for Fatigue (More fatigue during the day) and Joint Swelling (Joints ache, tingling in feet not better)        Documentation:  I communicated with the patient and/or health care decision maker about (see below):    Details of this discussion including any medical advice provided:     Started Omeprazole 20 mg daily after last OV - taking this in the morning.  Feels this is helping with the epigastric fullness and gas.  Takes an OTC stool softener nightly as well - moving her bowels regularly.    Having more tingling in her feet    Joint pain - "all of them"  Taking Tylenol or Ibuprofen as needed  Unsure if related to weather change    Also has pain in her L shoulder and elbow constantly; can radiate down to her hand; not related to activity    Tremors and shaking are worse - seems to be more widespread in her legs and in both arms.      When she gets done exerting herself, she will break into a sweat and have to go sit where it's cool.  Happens when she tries to do housework or take the dog out.  She will sweat under her breasts.  Does not occur at rest.  Has the L arm pain (as above), but doesn't necessarily relate it to exertion.          Controlled Substance Monitoring:    Acute and Chronic Pain Monitoring:   RX Monitoring 11/27/2020   Periodic Controlled Substance Monitoring No signs of potential drug abuse or diversion identified.    Obtained or confirmed "Medication Contract" on file.             Total Time: minutes: 11-20 minutes    Katie Juarez was evaluated through a synchronous (real-time) audio encounter. Patient identification was verified at the start of the visit. She (or guardian if applicable) is aware that this is a billable service, which includes applicable co-pays. This visit was conducted with the patient's (and/or legal guardian's) verbal consent. She has not had a related appointment within my department in the past  7 days or scheduled within the next 24 hours.   The patient was located at Home: [address]  [place] [address].  The provider was located at [facility] ([place] Dept): [address] [place] [address].    Note: not billable if this call serves to triage the patient into an appointment for the relevant concern    [name], DO

## 2020-11-27 NOTE — Telephone Encounter (Signed)
[facility] calling needing clarification on directions for primidone.

## 2020-11-27 NOTE — Telephone Encounter (Signed)
Pt claims she is waiting on call from Dr. Renard Hamper 9:40

## 2020-11-30 MED ORDER — PRIMIDONE 50 MG PO TABS
50 MG | ORAL_TABLET | ORAL | 0 refills | Status: DC
Start: 2020-11-30 — End: 2020-12-16

## 2020-11-30 NOTE — Telephone Encounter (Signed)
New Rx sent to pharmacy with corrected instructions.

## 2020-11-30 NOTE — Telephone Encounter (Signed)
Pt calling stating she was started on Primodone 50mg  1/2 tab last Friday, pt states she only took it one time and pt states she can't take this med as it made her feel like she was in a cloud and couldn't function and pt has to drive to [place] this week, pt uses pended pharmacy

## 2020-12-02 ENCOUNTER — Ambulatory Visit: Admit: 2020-12-02 | Discharge: 2020-12-02 | Payer: MEDICARE | Attending: Internal Medicine | Primary: Family Medicine

## 2020-12-02 DIAGNOSIS — R079 Chest pain, unspecified: Secondary | ICD-10-CM

## 2020-12-02 NOTE — Progress Notes (Signed)
Cardiology Consultation/Follow Up.  Katie Juarez  September 09, 1940  [id number]    Today: 12/02/20    CC: Patient is here for new consult for CP and SOB and arm pain.     HPI:   Katie Juarez is here for new consult for CP and SOB and arm pain.  Has been going one for 4-5 months.   Can get worse with exertion.   No palpitations, orthopnea, pnd, le edema.   Can get some diaphoresis.     Past Medical:  Past Medical History:   Diagnosis Date    Basal cell carcinoma (BCC) of face 10/2018    excised by Dr Yehuda Savannah     Breast cancer [facility]) 1999    in remission    Constipation 12/27/2018    COPD (chronic obstructive pulmonary disease) ([place])     DDD (degenerative disc disease)     Large bowel obstruction ([place]) 02/2015    [facility]    LBP (low back pain)     Pharyngoesophageal dysphagia     SS (spinal stenosis)          Past Surgical:  Past Surgical History:   Procedure Laterality Date    [place]    BREAST LUMPECTOMY Right 1999    CHOLECYSTECTOMY  1990    EXCISION/BIOPSY Left 11/01/2018    Dr Yehuda Savannah / [name], for [place] Left 02/20/2015    strangulated with small bowel resection - [facility],  [facility] [place])  1980    Done for endometriosis; done in [place]  01/03/2019    [place]  11/14/13, 06/23/11, 06/30/11, 07/12/11 12/29/11    L4/L5 IESI    OTHER SURGICAL HISTORY  12/17/2013    L5/S1 IESI    OTHER SURGICAL HISTORY Bilateral 05/30/2014    SIJ and piriformis    OTHER SURGICAL HISTORY Bilateral 09/16/2014    L3, L4, L5 Diagnostic Medial Branch Block    OTHER SURGICAL HISTORY Bilateral 09/26/2014    L5 TFE    OTHER SURGICAL HISTORY  10/10/2014    bil L5 TFE    OTHER SURGICAL HISTORY  10/31/2014    caudal    SMALL INTESTINE SURGERY  02/20/2015    strangulated left femoral hernia    SPINE SURGERY N/A 01/03/2019     VERTEBRAL AUMENTATION  L1  (C-ARM X2, performed by Loura Back, MD at [facility] N/A 04/24/2019    EGD BIOPSY performed by Monna Fam, MD at [facility] OR         Family History:  Family History   Problem Relation Age of Onset    Other Mother         ALS - diagnosed at age 68    Stroke Father         age 59    Cancer Sister         pt thinks it was liver    Cancer Brother         leukemia    Diabetes Maternal Grandfather     Other Brother         drowned    Other Brother  died at age 55; think due to spinal meningitis    Lupus Sister     Alzheimer's Disease Sister     Dementia Sister     Mult Sclerosis Nephew     Mult Sclerosis Daughter        Social History:  Social History     Socioeconomic History    Marital status: Widowed     Spouse name: Not on file    Number of children: Not on file    Years of education: Not on file    Highest education level: Not on file   Occupational History    Not on file   Tobacco Use    Smoking status: Former     Packs/day: 1.00     Years: 40.00     Pack years: 40.00     Types: Cigarettes     Start date: 01/10/1953     Quit date: 01/11/1988     Years since quitting: 32.9    Smokeless tobacco: Never    Tobacco comments:     quit 25 years ago 1990   Vaping Use    Vaping Use: Never used   Substance and Sexual Activity    Alcohol use: No     Alcohol/week: 0.0 standard drinks    Drug use: Never    Sexual activity: Not on file   Other Topics Concern    Not on file   Social History Narrative    Not on file     Social Determinants of Health     Financial Resource Strain: Not on file   Food Insecurity: Not on file   Transportation Needs: Not on file   Physical Activity: Sufficiently Active    Days of Exercise per Week: 7 days    Minutes of Exercise per Session: 40 min   Stress: Not on file   Social Connections: Not on file   Intimate Partner Violence: Not on file   Housing Stability: Not on file        REVIEW OF SYSTEMS:     Constitutional: there has been no unanticipated weight loss. There's been No change in energy level, No change in activity level.     Eyes: No visual changes or diplopia. No scleral icterus.  ENT: No Headaches, hearing loss or vertigo. No mouth sores or sore throat.  Cardiovascular: AS HPI  Respiratory: AS HPI  Gastrointestinal: No abdominal pain, appetite loss, blood in stools. No change in bowel or bladder habits.  Genitourinary: No dysuria, trouble voiding, or hematuria.  Musculoskeletal:  No gait disturbance, No weakness or joint complaints.  Integumentary: No rash or pruritis.  Neurological: No headache, diplopia, change in muscle strength, numbness or tingling. No change in gait, balance, coordination, mood, affect, memory, mentation, behavior.  Psychiatric: No new anxiety or depression.  Endocrine: No temperature intolerance. No excessive thirst, fluid intake, or urination. No tremor.  Hematologic/Lymphatic: No abnormal bruising or bleeding, blood clots or swollen lymph nodes.  Allergic/Immunologic: No nasal congestion or hives.    Medications:    Current Outpatient Medications:     primidone (MYSOLINE) 50 MG tablet, Take 1/2 tab by mouth nightly x 8 nights, then increase to 1 tab nightly., Disp: 30 tablet, Rfl: 0    omeprazole (PRILOSEC) 20 MG delayed release capsule, Take 1 capsule by mouth every morning (before breakfast), Disp: 90 capsule, Rfl: 1    [START ON 12/14/2020] LORazepam (ATIVAN) 0.5 MG tablet, Take 1 tablet by mouth nightly for 90 days., Disp:  90 tablet, Rfl: 0    donepezil (ARICEPT) 5 MG tablet, Take 1 tablet by mouth nightly, Disp: 90 tablet, Rfl: 1    citalopram (CELEXA) 20 MG tablet, Take 1.5 tablets by mouth daily, Disp: 135 tablet, Rfl: 1    Probiotic Acidophilus (FLORANEX) TABS, Take 1 tablet by mouth daily, Disp: , Rfl:     polyethylene glycol (GLYCOLAX) 17 GM/SCOOP powder, Take 17 g by mouth daily As needed., Disp: , Rfl:     budesonide-formoterol (SYMBICORT) 160-4.5 MCG/ACT AERO,  Inhale 2 puffs into the lungs 2 times daily Rinse mouth or brush teeth after each use., Disp: 30.6 g, Rfl: 3    alendronate (FOSAMAX) 70 MG tablet, Take 1 tablet by mouth every 7 days, Disp: 12 tablet, Rfl: 3    CRANBERRY PO, Take by mouth, Disp: , Rfl:     Cholecalciferol (VITAMIN D3) 50 MCG (2000 UT) CAPS, Take by mouth, Disp: , Rfl:     Handicap Placard MISC, by Does not apply route Issue parking placard for person with disability; Applicant meets the qualifying disability criteria. Length of time expected to have disability.  Prescription expires in 5 years from issuing date., Disp: 1 each, Rfl: 0    CALCIUM CARBONATE-VITAMIN D PO, Take 600 mg by mouth 2 times daily Contains 600 mg Calcium & 800 IU Vitamin D3., Disp: , Rfl:     Multiple Vitamins-Minerals (THERAPEUTIC MULTIVITAMIN-MINERALS) tablet, Take 1 tablet by mouth daily, Disp: , Rfl:     albuterol sulfate HFA (PROVENTIL;VENTOLIN;PROAIR) 108 (90 Base) MCG/ACT inhaler, Ventolin HFA 90 mcg/actuation aerosol inhaler  Inhale 2 puffs every 6 hours by inhalation route as needed.  ASTHMA, Disp: , Rfl:     polyvinyl alcohol-povidone (HYPOTEARS) 1.4-0.6 % ophthalmic solution, Place 1-2 drops into both eyes as needed., Disp: , Rfl:      Physical Exam:   Vitals: BP 100/70    Pulse 70    Ht 5\' 4"  (1.626 m)    Wt 134 lb (60.8 kg)    LMP  (LMP Unknown)    BMI 23.00 kg/m??   General appearance: alert and cooperative with exam  HEENT: Head: Normocephalic, no lesions, without obvious abnormality.  Neck: no carotid bruit, no JVD  Lungs: clear to auscultation bilaterally  Heart:  regular rate and rhythm, S1, S2 normal, no Murmur  Abdomen: soft, non-tender; bowel sounds normal; no masses,  no organomegaly  Extremities: no site injection hematoma, extremities normal, atraumatic, no cyanosis. no edema  Neurologic: Mental status: Alert, oriented, thought content appropriate    Labs:  Lab Results   Component Value Date    CHOL 159 11/05/2019    TRIG 156 (H) 11/05/2019    HDL 57  11/05/2019    LDLCHOLESTEROL 71 11/05/2019    LDLCALC 48.0 12/03/2018    VLDL NOT REPORTED (H) 11/05/2019    CHOLHDLRATIO 2.8 11/05/2019       Lab Results   Component Value Date    NA 139 09/12/2020    K 4.5 09/12/2020    CL 104 09/12/2020    CO2 30 09/12/2020    BUN 17 09/12/2020    CREATININE 0.9 09/12/2020    GLUCOSE 102 09/12/2020    CALCIUM 9.4 09/12/2020    PROT 7.2 06/15/2020    LABALBU 4.1 09/12/2020    BILITOT 0.3 09/12/2020    ALKPHOS 70 09/12/2020    AST 27 09/12/2020    ALT 21 09/12/2020    LABGLOM > 60.0 09/12/2020    GFRAA >60  06/15/2020    AGRATIO 1.4 09/27/2017    GLOB 3.3 09/12/2020       EKG:   Most likely sinus, abnormal p wave morphology. No acute ST/T changes.     Past Medical and Surgical History, Problem List, Allergies, Medications, Labs, Imaging, all reviewed extensively in EMR and with the patient.    Assessment:  - chest pain, concern for angina given risk factors  - SOB  - L arm pain  - some diaphoresis     Plan:  - will check Lexiscan stress test to rule out ischemia/further risk stratify   - will check 2d Echo to eval for structural heart disease  - I instructed her to go to the nearest ER if develops CP that doesnt resolve. She agrees.     The patient is to continue heart healthy diet, weight loss and exercise as tolerated. Patient's medications and side effects were discussed. Medication refills were provided if needed. Follow up appointment timing was discussed. All questions and concerns were addressed to patient's satisfaction.     The patient is to follow up in 6 months or sooner if necessary.     Thank you for allowing me to participate in the care of this patient, please do not hesitate to call if you have any questions.    Clearnce Hasten, DO, [place], [place], [place], [facility]  [email or web address]  [phone]

## 2020-12-14 ENCOUNTER — Telehealth

## 2020-12-14 NOTE — Telephone Encounter (Signed)
Spoke to patient regarding the primodone. See telephone encounter 11/30/20. Patient did only take 1/2 tablet and it made her feel like a fog. States she did quit taking it. Patient states tremors have not changed. There is a phone note from 11/21 that never got routed to [place] regarding this.

## 2020-12-16 ENCOUNTER — Inpatient Hospital Stay: Admit: 2020-12-16 | Payer: MEDICARE | Primary: Family Medicine

## 2020-12-16 ENCOUNTER — Inpatient Hospital Stay: Admit: 2020-12-16 | Discharge: 2020-12-16 | Payer: MEDICARE | Primary: Family Medicine

## 2020-12-16 DIAGNOSIS — R079 Chest pain, unspecified: Secondary | ICD-10-CM

## 2020-12-16 LAB — ECHOCARDIOGRAM COMPLETE 2D W DOPPLER W COLOR: Left Ventricular Ejection Fraction: 65

## 2020-12-16 MED ORDER — REGADENOSON 0.4 MG/5ML IV SOLN
0.45 MG/5ML | Freq: Once | INTRAVENOUS | Status: AC
Start: 2020-12-16 — End: 2020-12-16
  Administered 2020-12-16: 14:00:00 0.4 mg via INTRAVENOUS

## 2020-12-16 MED ORDER — TECHNETIUM TC 99M SESTAMIBI IV KIT
Freq: Once | INTRAVENOUS | Status: AC | PRN
Start: 2020-12-16 — End: 2020-12-16
  Administered 2020-12-16: 15:00:00 10 via INTRAVENOUS

## 2020-12-16 MED ORDER — TECHNETIUM TC 99M SESTAMIBI IV KIT
Freq: Once | INTRAVENOUS | Status: AC | PRN
Start: 2020-12-16 — End: 2020-12-16
  Administered 2020-12-16: 15:00:00 30 via INTRAVENOUS

## 2020-12-16 NOTE — Procedures (Signed)
[facility]                 [place], [place] [address]                              CARDIAC STRESS TEST    PATIENT NAME: Katie Juarez, Katie Juarez                   DOB:        Jun 01, 1940  MED REC NO:   [id number]                             ROOM:  ACCOUNT NO:   [id number]                           ADMIT DATE: 12/16/2020  PROVIDER:     Clearnce Hasten, DO    DATE OF STUDY:  12/16/2020    STRESS TEST    Ordering Provider:  Clearnce Hasten, DO    Primary Care Provider:  [name], DO    Patient's Age:  80  Height:  5 feet, 4 inches     Weight:  134 pounds    INDICATION:  Chest pain, shortness of breath, left arm pain.    Lexiscan 0.4 mg injected over 10 seconds.  IV Cardiolite injected 20 seconds post Lexiscan injection.    Heart Rate:  53     Resting blood pressure:  193/88              HR   BP  1 min          82   176/81  2 min  3 min          80   173/81  4 min  5 min          69   184/86  6 min  7 min          70   168/78  8 min  9 min          69   172/82  10 min    Symptoms:  Chest Pain:  No.  Nausea:  Yes.  Headache:  Yes.  Shortness of breath:  No.  Other:  "Heavy chest." "Left arm pain."    Resting EKG:  Sinus bradycardia.    Arrhythmias:  None.    EKG changes:  No ischemic changes noted.    INTERPRETATION:  1.  No ischemic changes noted.  2.  Cardiolite results to follow.    Nuclear Myocardial Perfusion Imaging (SPECT)    TESTING METHOD  STRESS:   Lexiscan  AGENT:    Cardiolite  REST:          Injection Date:  12/16/20  Time:  0815  Amount:  10.4 mCi  STRESS:   Injection Date:  12/16/20  Time:  0920  Amount:  30.6 mCi  IMAGE TIME:    Rest:  0850  Stress:  1000    EF:  50%  TID:  0.99  LHR:  0.48    FINDINGS:  Ischemia (Reversible Defect):           Yes.  Infarction (Irreversible Defect):       No.  Normal Ejection Fraction:  Yes, 50%.  Normal Segmental Wall Motion:           Yes.    IMPRESSION:  1.  Moderate size/intensity mid anteroseptal ischemia.  2.  No  infarct.  3.  Normal left ventricular ejection fraction 50% and normal wall  motion.        Clearnce Hasten, DO    D: 12/17/2020 15:36:39       T: 12/17/2020 15:38:13     SA/JUDY_EDIT  Job#: [id number]     Doc#: Unknown    CC:  [name]

## 2020-12-16 NOTE — Other (Signed)
Normal EF, grade II DD, mild valve disease, continue current plan

## 2020-12-16 NOTE — Telephone Encounter (Signed)
Patient agreeable to try medication. Patient aware to monitor BP while on this medication. Aware of dosage and instructions. Patient uses [facility] in [place].

## 2020-12-16 NOTE — Progress Notes (Signed)
Patient Name:  Katie Juarez MRN:  [id number]   DOB:  05-21-1940  Age:  80 y.o. Sex: female     Ordering Provider: Clearnce Hasten, DO    Primary Care Provider:  [name], DO     Indications: chest pain, shortness of breath     Medications:     Current Outpatient Medications:     primidone (MYSOLINE) 50 MG tablet, Take 1/2 tab by mouth nightly x 8 nights, then increase to 1 tab nightly., Disp: 30 tablet, Rfl: 0    omeprazole (PRILOSEC) 20 MG delayed release capsule, Take 1 capsule by mouth every morning (before breakfast), Disp: 90 capsule, Rfl: 1    LORazepam (ATIVAN) 0.5 MG tablet, Take 1 tablet by mouth nightly for 90 days., Disp: 90 tablet, Rfl: 0    donepezil (ARICEPT) 5 MG tablet, Take 1 tablet by mouth nightly, Disp: 90 tablet, Rfl: 1    citalopram (CELEXA) 20 MG tablet, Take 1.5 tablets by mouth daily, Disp: 135 tablet, Rfl: 1    Probiotic Acidophilus (FLORANEX) TABS, Take 1 tablet by mouth daily, Disp: , Rfl:     polyethylene glycol (GLYCOLAX) 17 GM/SCOOP powder, Take 17 g by mouth daily As needed., Disp: , Rfl:     budesonide-formoterol (SYMBICORT) 160-4.5 MCG/ACT AERO, Inhale 2 puffs into the lungs 2 times daily Rinse mouth or brush teeth after each use., Disp: 30.6 g, Rfl: 3    alendronate (FOSAMAX) 70 MG tablet, Take 1 tablet by mouth every 7 days, Disp: 12 tablet, Rfl: 3    CRANBERRY PO, Take by mouth, Disp: , Rfl:     Cholecalciferol (VITAMIN D3) 50 MCG (2000 UT) CAPS, Take by mouth, Disp: , Rfl:     Handicap Placard MISC, by Does not apply route Issue parking placard for person with disability; Applicant meets the qualifying disability criteria. Length of time expected to have disability.  Prescription expires in 5 years from issuing date., Disp: 1 each, Rfl: 0    CALCIUM CARBONATE-VITAMIN D PO, Take 600 mg by mouth 2 times daily Contains 600 mg Calcium & 800 IU Vitamin D3., Disp: , Rfl:     Multiple Vitamins-Minerals (THERAPEUTIC MULTIVITAMIN-MINERALS) tablet, Take 1 tablet by mouth daily, Disp: ,  Rfl:     albuterol sulfate HFA (PROVENTIL;VENTOLIN;PROAIR) 108 (90 Base) MCG/ACT inhaler, Ventolin HFA 90 mcg/actuation aerosol inhaler  Inhale 2 puffs every 6 hours by inhalation route as needed.  ASTHMA, Disp: , Rfl:     polyvinyl alcohol-povidone (HYPOTEARS) 1.4-0.6 % ophthalmic solution, Place 1-2 drops into both eyes as needed., Disp: , Rfl:     Current Facility-Administered Medications:     regadenoson (LEXISCAN) injection 0.4 mg, 0.4 mg, IntraVENous, Once, Daymon Larsen, MD    Facility-Administered Medications Ordered in Other Encounters:     technetium sestamibi (CARDIOLITE) injection 10 millicurie, 10 millicurie, IntraVENous, ONCE PRN, Sinan Alo, DO    technetium sestamibi (CARDIOLITE) injection 30 millicurie, 30 millicurie, IntraVENous, ONCE PRN, Sinan Alo, DO      ----------------------------------------------------------------------------------------------------------                Lexiscan 0.4 mg injected over 10 seconds.    IV Cardiolite injected 20 seconds post Lexiscan injection.     Heart Rate:  53  Resting Blood Pressure:  193/88   ----------------------------------------------------------------------------------------------------------     HR BP   1 min 82 176/81   2 min     3 min 80 173/81   4 min  5 min 69 184/86   6 min     7 min 70 168/78   8 min     9 min 69 172/82   10 min       Symptoms:  Chest Pain:  No      Nausea:  Yes     Headache:  Yes    Shortness of Breath:  No     Other:  "heavy chest" "left arm pain"        Electronically signed by Gwendlyn Deutscher, RCP on 12/16/20 at 9:00 AM EST    ----------------------------------------------------------------------------------------------------------  Resting EKG:  Sinus bradycardia    Arrhythmias:  none     EKG Changes:  no ischemic changes noted.       Interpretation:  No ischemic changes noted. Cardiolite results to follow.       Supervising Physician:  Daymon Larsen, MD

## 2020-12-16 NOTE — Telephone Encounter (Signed)
If pt does not want to take Primidone due to side effects, that is fine - will remove from med list.  We could try Propanolol instead  to see if that helps.  If agreeable, will start 40 mg BID and we can see how she's doing on this at her upcoming f/u appt on 12/27.  This is only the lowest dose, so if tolerating but she has not seen change in tremor, can then increase the dose.    Pt should also monitor her BP while taking to ensure it does not drop - it should not change much, but would like her to still check every 3 days or so, or if she feels lightheaded.

## 2020-12-17 ENCOUNTER — Telehealth

## 2020-12-17 NOTE — Telephone Encounter (Addendum)
Stress reviewed. Cardiac Cath ordered.     The following orders will be implemented.   Orders with a []  are choices and are NOT implemented unless the box is checked.    Pre-Procedure Orders    Katie Juarez     Sep 14, 1940     Diagnosis:Abnormal stress test      Procedure scheduled for:   Procedure ordered was discussed with the patient  including risks, benefits and alternative therapies.    []  Left Heart Catheterization and coronary angiography    [x]  Left Heart Catheterization and coronary angiography w/possible PCI/Coronary Stent PCI    []  Right Heart Catheterization  []  With Flolan  []  Aortic Root    []  TEE  []  Cardioversion    []  EPS  []  Tilt Table Study per guidelines  []  With Isuprel  []  With NTG    []  Without medication    []  ILR[]     ICD: []  Replacement  []  New implant  []  Lead Extraction    Permanent Pacemaker:  []  Replacement  []  New Implant    []  Lead Extraction    Irrigation:  []  Vancomycin 1 gram ( Check mark for PPM/ICD)    Ablation:   []  CRYO  []  SVT  []  A-Fib  []  A-Flutter  []  PVC  []  VT    []  With Anesthesia  []  W/O Anesthesia []  With ESI  []  W/O ESI     []  With CARTO  []  W/O CARTO    ORDERS    [x]  EKG   [x]  Point of care testing  []  Urine Pregnancy  [x]  PLT    [x]  PT/INR if on Coumadin  [x]  NA, K, Glucose, CL, Creat, Calc, HBG/HCT    IV:  [x]  Start Peripheral IV: [x]  N/S at  100    ml/hr. []  D5 1/2 N/S at     ml/hr.     Patient has history of contrast reaction; give below meds as prophylaxis 30-60 min prior to procedure:    []  Benadryl (diphenhydramine) 25 mg IV x 1 dose    []  Benadryl (diphenhydramine) 50 mg IV x 1 dose    []  Solu-Medrol (methylprednisolone) 125 mg IV x 1 dose    []  Hydrocortisone (SOLU-CORTEF) 100 mg. IV x 1 dose    []  Lidocaine 1% 0.4 ml subq for IV start    Electronically signed by provider ____Sinan Alo____________12/08/2020 5:06 PM

## 2020-12-17 NOTE — Telephone Encounter (Signed)
Patient notified of abnormal stress in details. She is in agreement with heart cath if recommended. She was instructed to go to ER for any chest pain/shortness of breath. She is asymptomatic at this time. Please advise      Patient will not be home in the morning. Please leave message on phone and she will call back after lunch.  [place] [phone]

## 2020-12-18 DIAGNOSIS — R079 Chest pain, unspecified: Secondary | ICD-10-CM

## 2020-12-18 MED ORDER — PROPRANOLOL HCL 40 MG PO TABS
40 MG | ORAL_TABLET | Freq: Two times a day (BID) | ORAL | 0 refills | Status: DC
Start: 2020-12-18 — End: 2021-04-13

## 2020-12-18 NOTE — Telephone Encounter (Signed)
Patient notified of cath recommendation and agrees. Orders sent to [facility] att Claiborne Billings to call patient to schedule cath.

## 2020-12-21 ENCOUNTER — Inpatient Hospital Stay: Admit: 2020-12-21 | Payer: MEDICARE | Primary: Family Medicine

## 2020-12-21 DIAGNOSIS — I2510 Atherosclerotic heart disease of native coronary artery without angina pectoris: Secondary | ICD-10-CM

## 2020-12-21 LAB — CBC WITH AUTO DIFFERENTIAL
Absolute Eos #: 0.2 10*3/uL (ref 0.0–0.4)
Absolute Lymph #: 1.6 10*3/uL (ref 1.0–4.8)
Absolute Mono #: 1 10*3/uL (ref 0.1–1.2)
Basophils Absolute: 0.1 10*3/uL (ref 0.0–0.2)
Basophils: 1 % (ref 0–2)
Eosinophils %: 3 % (ref 1–4)
Hematocrit: 39.8 % (ref 36–46)
Hemoglobin: 13.2 g/dL (ref 12.0–16.0)
Lymphocytes: 18 % — ABNORMAL LOW (ref 24–44)
MCH: 30.5 pg (ref 26–34)
MCHC: 33.3 g/dL (ref 31–37)
MCV: 91.9 fL (ref 80–100)
MPV: 8.7 fL (ref 6.0–12.0)
Monocytes: 11 % (ref 2–11)
Platelets: 187 10*3/uL (ref 140–450)
RBC: 4.33 m/uL (ref 4.0–5.2)
RDW: 14.7 % (ref 12.5–15.4)
Seg Neutrophils: 67 % — ABNORMAL HIGH (ref 36–66)
Segs Absolute: 6.2 10*3/uL (ref 1.8–7.7)
WBC: 9.2 10*3/uL (ref 3.5–11.0)

## 2020-12-21 LAB — COMPREHENSIVE METABOLIC PANEL
ALT: 12 U/L (ref 5–33)
AST: 19 U/L (ref <32)
Albumin/Globulin Ratio: 1.5 (ref 1.0–2.5)
Albumin: 4 g/dL (ref 3.5–5.2)
Alkaline Phosphatase: 60 U/L (ref 35–104)
Anion Gap: 11 mmol/L (ref 9–17)
BUN: 25 mg/dL — ABNORMAL HIGH (ref 8–23)
CO2: 25 mmol/L (ref 20–31)
Calcium: 9.1 mg/dL (ref 8.6–10.4)
Chloride: 105 mmol/L (ref 98–107)
Creatinine: 0.93 mg/dL — ABNORMAL HIGH (ref 0.50–0.90)
Est, Glom Filt Rate: >60 mL/min/{1.73_m2} (ref >60)
Glucose: 102 mg/dL — ABNORMAL HIGH (ref 70–99)
Potassium: 4.1 mmol/L (ref 3.7–5.3)
Sodium: 141 mmol/L (ref 135–144)
Total Bilirubin: 0.3 mg/dL (ref 0.3–1.2)
Total Protein: 6.6 g/dL (ref 6.4–8.3)

## 2020-12-21 MED ORDER — LIDOCAINE HCL (PF) 1 % IJ SOLN
1 % | INTRAMUSCULAR | Status: AC
Start: 2020-12-21 — End: ?

## 2020-12-21 MED ORDER — MIDAZOLAM HCL 2 MG/2ML IJ SOLN
2 MG/ML | INTRAMUSCULAR | Status: AC
Start: 2020-12-21 — End: ?

## 2020-12-21 MED ORDER — HEPARIN SODIUM (PORCINE) 1000 UNIT/ML IJ SOLN
1000 UNIT/ML | INTRAMUSCULAR | Status: AC
Start: 2020-12-21 — End: ?

## 2020-12-21 MED ORDER — SODIUM CHLORIDE 0.9 % IV SOLN
0.9 % | INTRAVENOUS | Status: AC
Start: 2020-12-21 — End: 2020-12-24
  Administered 2020-12-21: 12:00:00 via INTRAVENOUS

## 2020-12-21 MED ORDER — NITROGLYCERIN IN D5W 200-5 MCG/ML-% IV SOLN
200-5- MCG/ML-% | INTRAVENOUS | Status: AC
Start: 2020-12-21 — End: ?

## 2020-12-21 MED ORDER — SODIUM CHLORIDE 0.9 % IV SOLN
0.9 % | INTRAVENOUS | Status: DC | PRN
Start: 2020-12-21 — End: 2020-12-24

## 2020-12-21 MED ORDER — NORMAL SALINE FLUSH 0.9 % IV SOLN
0.9 % | INTRAVENOUS | Status: DC | PRN
Start: 2020-12-21 — End: 2020-12-24

## 2020-12-21 MED ORDER — FENTANYL CITRATE (PF) 100 MCG/2ML IJ SOLN
100 MCG/2ML | INTRAMUSCULAR | Status: AC
Start: 2020-12-21 — End: ?

## 2020-12-21 MED ORDER — NORMAL SALINE FLUSH 0.9 % IV SOLN
0.9 % | Freq: Two times a day (BID) | INTRAVENOUS | Status: AC
Start: 2020-12-21 — End: 2020-12-24

## 2020-12-21 MED ORDER — VERAPAMIL HCL 2.5 MG/ML IV SOLN
2.5 MG/ML | INTRAVENOUS | Status: AC
Start: 2020-12-21 — End: ?

## 2020-12-21 MED ORDER — IOPAMIDOL 76 % IV SOLN
76 % | INTRAVENOUS | Status: AC
Start: 2020-12-21 — End: ?

## 2020-12-21 MED FILL — MIDAZOLAM HCL 2 MG/2ML IJ SOLN: 2 MG/ML | INTRAMUSCULAR | Qty: 2

## 2020-12-21 MED FILL — LIDOCAINE HCL (PF) 1 % IJ SOLN: 1 % | INTRAMUSCULAR | Qty: 30

## 2020-12-21 MED FILL — ISOVUE-370 76 % IV SOLN: 76 % | INTRAVENOUS | Qty: 100

## 2020-12-21 MED FILL — FENTANYL CITRATE (PF) 100 MCG/2ML IJ SOLN: 100 MCG/2ML | INTRAMUSCULAR | Qty: 2

## 2020-12-21 MED FILL — NITROGLYCERIN IN D5W 200-5 MCG/ML-% IV SOLN: 200-5 MCG/ML-% | INTRAVENOUS | Qty: 250

## 2020-12-21 MED FILL — HEPARIN SODIUM (PORCINE) 1000 UNIT/ML IJ SOLN: 1000 UNIT/ML | INTRAMUSCULAR | Qty: 10

## 2020-12-21 MED FILL — VERAPAMIL HCL 2.5 MG/ML IV SOLN: 2.5 MG/ML | INTRAVENOUS | Qty: 2

## 2020-12-21 NOTE — Progress Notes (Addendum)
Received post procedure to [facility] to room 1. Assessment obtained. Restrictions reviewed with patient. Post procedure pathway initiated.  Right Femoral site soft , occlusive dressing dry and intact.  No hematoma noted.  Family at side.  Patient without complaints. Head of bed flat with right leg straight.   Right Radial artery site CDI  No drainage or hematoma noted.

## 2020-12-21 NOTE — Progress Notes (Addendum)
Patient admitted, consent signed and questions answered. Patient ready for procedure. Call light to reach with side rails up 2 of 2. Bilateral groins clipped with writer and Jadene Pierini present.  Daughter at bedside with patient.  History and physical completed.

## 2020-12-21 NOTE — Progress Notes (Signed)
All discharge instructions reviewed, questions answered, paper signed and given copy. Patient discharged per ambulation with daughter and belongings.

## 2020-12-21 NOTE — Op Note (Signed)
[facility]    CARDIAC CATHETERIZATION    Date:   12/21/2020  Patient name: Katie Juarez  Date of admission:  12/21/2020  6:27 AM  MRN:   [id number]  Date of Birth:  December 26, 1940    Operators:  Primary:     CV Fellow:    Pre Procedure Conscious Sedation Data:    ASA Class:    []  I [x]  II []  III []  IV    Mallampati Class:  []  I [x]  II []  III []  IV     Indication:  []  STEMI      [x]  + Stress test  []  ACS      [x]  + EKG Changes  []  Non Q MI       [x]  Significant Risk Factors  [x]  Recurrent Angina             []  Diabetes Mellitus    []  New LBBB      []  Uncontrolled HTN.  []  CHF / Low EF changes     []  Abnormal CTA / Ca Score    Procedure:  Access:  [x]  Femoral  []  Radial  artery       [x]  Right  []  Left    Procedure: After informed consent was obtained with explanation of the risks and benefits, patient was brought to the cath lab. The access area was prepped and draped in sterile fashion. 1% lidocaine was used for local block. The artery was cannulated with 6  Fr sheath with brisk arterial blood return. The side port was frequently flushed and aspirated with normal saline.    Findings:  Left main: NL  LAD: 20% mid area  D1: proximal 70% stenosis 9Small branch)  LCX: NL  RCA: Mid 25% stenosis  The LV gram was performed in the RAO 30 position.   LVEF: 55%. LV Wall Motion: Normal    Conclusions:  Normal / Non obstructive CAD  D1 disease but small branch.  Overall preserved LV function    Recommendation:  Medical treatments    Estimated Blood Loss: []  <10   [x]  10-25 []  25-50   []  50-100 []  >100      History and Risk Factors    []  Hypertension     [x]  Family history of CAD  [x]  Hyperlipidemia     [x]  Cerebrovascular Disease   []  Prior MI       []  Peripheral Vascular disease   []  Prior PCI              []  Diabetes Mellitus    []  Left Main PCI.     []  Currently on Dialysis.  []  Prior CABG.      []  Currently smoker.    []  Cardiac Arrest outside of healthcare facility. []  Yes    [x]  No        Witnessed     []   Yes   []  No     Arrest after arrival of EMS  []  Yes   []  No     []  Cardiac Arrest at other Facility.    []  Yes   [x]  No    Pre-Procedure Information.  Heart Failure       []  Yes    [x]  No        Class  []  I      []  II  []  III    []  IV.    New Diagnosis    []  Yes  []  No  HF Type      []  Systolic   []  Diastolic          []  Unknown.    Diagnostic Test:   EKG       []  Normal   [x]  Abnormal    New antiarrhythmia medications:    []  Yes   []  No   New onset atrial fibrillation / Flutter     []  Yes   []  No   ECG Abnormalities:      []  V. Fib   []  Sus V. Tach           []  NS V. T   []  New LBBB           []  T. Inv  []   ST dev > 0.5 mm         []  PVC's freq  []  PVC's infrequent    Stress Test Performed:   [x]  Yes    []  No     Type:     []  Stress Echo   []  Exercise Stress Test (no imaging)      [x]  Stress Nuclear  []  Stress Imaging     Results   []  Negative   [x]  Positive        []  Indeterminate  []  Unavailable     If Positive/ Risk / Extent of Ischemia:       []  Low  [x]  Intermediate         []  High  []  Unavailable      Cardiac CTA Performed:  []  Yes    [x]  No      Results   []  CAD   []  Non obstructive CAD      []  No CAD   []  Uncertain      []  Unknown   []  Structural Disease.           Electronically signed by Osker Mason, MD on 12/21/2020 at 7:00 AM      [facility]  [phone]

## 2020-12-21 NOTE — Progress Notes (Addendum)
Patient awake and sitting up in bed with daughter at bedside.  No assessment changes.  Coffee and nourishments offered.  Daughter at bedside.  Initiating discharge instructions.

## 2020-12-21 NOTE — Progress Notes (Signed)
Patient up ambulating in unit to commode.   Assessment unchanged.  Continues to drink and eat without concern.

## 2020-12-21 NOTE — Discharge Instructions (Signed)
Discharge Instructions for angiogram  Home Care     Ok to shower in AM. Discontinue band aid in AM.   Do not apply further band aids. Keep clean, dry and open to air. No powder or lotion.  Do not soak in a pool or tub and do not swim for one week.   If there is any bleeding at the catheter site, apply firm pressure with your hands until the bleeding stops. If bleeding continues after 3 minutes call 911.   If there is any swelling or firm areas at your puncture site, this could be bleeding under the skin(hematoma), and if you have any concerns seek help immediately.        Drink plenty of fluids after the test. This will flush the x-ray dye from your system.   Return to your normal diet.       The sedative will make you sleepy. Rest until the effects have worn off.   Ask your doctor when you will be able to return to work.   Avoid heavy lifting objects greater than 10  pounds, physically demanding activities, and sexual activity for 5-7 days.   Your activity will also depend upon where the catheter was inserted: Do not sit for long periods of time. Try to change positions frequently.   Medications   If advised by your doctor, resume taking your normal medicines. Use acetaminophen (Tylenol) for pain relief.   Do not take metformin (Glucophage) or glyburide and metformin (Glucovance) for 48 hours after the test.    If you had to stop taking these medications before the procedure, ask your doctor when you can resume taking them:   If youAnti-inflammatory drugs (eg, ibuprofen )   Blood thinners, such as warfarin (Coumadin)   Clopidogrel (Plavix)   If you are taking medicines, follow these general guidelines:   Take your medicine as directed. Do not change the amount or the schedule.   Do not stop taking them without talking to your doctor.   Do not share them.   Know what the results and side effects. Report them to your doctor.   Some drugs can be dangerous when mixed. Talk to a doctor or pharmacist if you are taking  more than one drug. This includes over-the-counter medicine and herb or dietary supplements.   Plan ahead for refills so you don't run out.      Call Your Doctor If Any of the Following Occurs     :   Signs of infection, including fever and chills   Redness, swelling, increasing pain, excessive bleeding, or any discharge from the catheter insertion site   CALL 911 if you have symptoms including:   Drooping facial muscles   Changes in vision or speech   Difficulty walking or using your limbs   Change in sensation to affected leg, including numbness, feeling cold, or change in color   Extreme sweating, nausea or vomiting   Dizziness or lightheadedness   Chest pain   Rapid, irregular heartbeat   Palpitations   Cough, shortness of breath, or difficulty breathing   Weakness or fainting   If you think you have an emergency, CALL 911 .                                  Coronary artery disease (CAD) occurs when plaque builds up in the arteries that bring oxygen-rich blood to your heart. Plaque   is a fatty substance made of cholesterol, calcium, and other substances in the blood. This process is called hardening of the arteries, or atherosclerosis.  What happens when you have coronary artery disease?  Plaque may narrow the coronary arteries. Narrowed arteries cause poor blood flow. This can lead to angina symptoms such as chest pain or discomfort. If blood flow is completely blocked, you could have a heart attack.  You can slow CAD and reduce the risk of future problems by making changes in your lifestyle. These include quitting smoking and eating heart-healthy foods.  Treatments for CAD, along with changes in your lifestyle, can help you live a longer and healthier life.  How can you prevent coronary artery disease?  Do not smoke. It may be the best thing you can do to prevent heart disease. If you need help quitting, talk to your doctor about stop-smoking programs and medicines. These can increase your chances of quitting  for good.  Be active. Get at least 30 minutes of exercise on most days of the week. Walking is a good choice. You also may want to do other activities, such as running, swimming, cycling, or playing tennis or team sports.  Eat heart-healthy foods. Eat more fruits and vegetables and less foods that contain saturated and trans fats. Limit alcohol, sodium, and sweets.  Stay at a healthy weight. Lose weight if you need to.  Manage other health problems such as diabetes, high blood pressure, and high cholesterol.  Talk to your doctor about taking a daily aspirin.  Manage stress. Stress can hurt your heart. To keep stress low, talk about your problems and feelings. Don't keep your feelings hidden.  How is coronary artery disease treated?  Your doctor will suggest that you make lifestyle changes. For example, your doctor may ask you to eat healthy foods, quit smoking, lose extra weight, and be more active.  You will have to take medicines.  Your doctor may suggest a procedure to open narrowed or blocked arteries. This is called angioplasty. Or your doctor may suggest using healthy blood vessels to create detours around narrowed or blocked arteries. This is called bypass surgery.  Follow-up care is a key part of your treatment and safety. Be sure to make and go to all appointments, and call your doctor if you are having problems. It's also a good idea to know your test results and keep a list of the medicines you take.    Where can you learn more?    Go to [email or web address] and sign in to your MyChart account. Enter C643 in the Search Health Information box to learn more about ???Learning About Coronary Artery Disease (CAD).???    If you do not have an account, please click on the ???Sign Up Now??? link.   ?? 2006-2015 [employer]. Care instructions adapted under license by [facility]. This care instruction is for use with your licensed healthcare professional. If you have questions about a  medical condition or this instruction, always ask your healthcare professional. [employer], Inc[employer] disclaims any warranty or liability for your use of this information.  Content Version: 10.6.465758; Current as of: March 01, 2013      Discharge Instructions      SEDATION / ANALGESIA INFORMATION / HOME GOING ADVICE  You have received the sedation/analgesia medication during your visit    Sedation/analgesia is used during short medical procedures under controlled supervision. The medication will produce a strong relaxation. You will be able to hear, speak and follow instructions,   but your memory and alertness will be decreased.    You will be able to swallow and breathe on your own. During sedation/analgesia your blood pressure, heart and breathing will be watched closely. After the procedure, you may not remember what was said or done.    You may have the following effects from the medication.  " Drowsiness, dizziness, sleepiness or confusion.  " Difficulty remembering or delayed reaction times.  " Loss of fine muscle control or difficulty with your balance especially while walking.  " Difficulty focusing or blurred vision.  You may not be aware of slight changes in your behavior and/or your reaction time because of the medication used during the procedure. Therefore you should follow these instructions.  " Have someone responsible help you with your care.  " Do not drive for 24 hours.  " Do not operate equipment for 24 hours (lawnmowers, power tools, kitchen accessories, stove).  " Do not drink any alcoholic beverages for a minimum of 24 hours.  " Do not make important personal, legal or business decisions for 24 hours.  " You may experience dizziness or lightheadedness. Move slowly and carefully, do not make sudden position changes.  " Drink extra amounts of fluids today.  " Increase your diet as tolerated (unless you have received specific instructions from your doctor).  " If you feel nauseated, continue  with liquids until the nausea is gone.  " Notify your physician if you have not urinated within 8 hours after the procedure.  " Resume your medications unless otherwise instructed

## 2020-12-21 NOTE — H&P (Addendum)
[facility]   Admission Note                 Date:   12/21/2020  Patient name: Katie Juarez  Date of admission:  12/21/2020  6:27 AM  MRN:   [id number]  Date of Birth:  1940/08/10    Reason for Admission:  Heart cath    CHIEF COMPLAINT:  Chest pain   [  x] Please see the original hard copy H&P by [facility] from  Dr. Chauncey Cruel. ALO  in the short chart .    [  ]  No changes in the information from the time of evaluation    [   ]  The following changes happened since that H&P    CP and SOB and arm pain.  Has been going one for 4-5 months.   Can get worse with exertion.   No palpitations, orthopnea, pnd, le edema.   Can get some diaphoresis.       Stress test was abnormal with ischemia anterior apical area        Past Medical History:   has a past medical history of Basal cell carcinoma (BCC) of face, Breast cancer ([place]), Constipation, COPD (chronic obstructive pulmonary disease) ([place]), DDD (degenerative disc disease), Large bowel obstruction (HCC), LBP (low back pain), Pharyngoesophageal dysphagia, and SS (spinal stenosis).    Past Surgical History:   has a past surgical history that includes Hysterectomy (1980); bladder suspension (1990); Cholecystectomy (1990); Breast lumpectomy (Right, 1999); other surgical history (11/14/13, 06/23/11, 06/30/11, 07/12/11 12/29/11); other surgical history (12/17/2013); other surgical history (Bilateral, 05/30/2014); other surgical history (Bilateral, 09/16/2014); other surgical history (Bilateral, 09/26/2014); other surgical history (10/10/2014); other surgical history (10/31/2014); Tonsillectomy (1946); Appendectomy (1956); Small intestine surgery (02/20/2015); Femoral hernia repair (Left, 02/20/2015); Excision/Biopsy (Left, 11/01/2018); Lumbar spine surgery (01/03/2019); Spine surgery (N/A, 01/03/2019); and Upper gastrointestinal endoscopy (N/A, 04/24/2019).     Home Medications:    Prior to Admission medications    Medication Sig Start Date End Date Taking? Authorizing  Provider   propranolol (INDERAL) 40 MG tablet Take 1 tablet by mouth 2 times daily 12/18/20   [name], DO   omeprazole (PRILOSEC) 20 MG delayed release capsule Take 1 capsule by mouth every morning (before breakfast) 11/27/20   [name], DO   LORazepam (ATIVAN) 0.5 MG tablet Take 1 tablet by mouth nightly for 90 days. 12/14/20 03/14/21  [name], DO   donepezil (ARICEPT) 5 MG tablet Take 1 tablet by mouth nightly 11/03/20   [name], DO   citalopram (CELEXA) 20 MG tablet Take 1.5 tablets by mouth daily 10/01/20   [name], DO   Probiotic Acidophilus [facility]) TABS Take 1 tablet by mouth daily    Historical Provider, MD   polyethylene glycol (GLYCOLAX) 17 GM/SCOOP powder Take 17 g by mouth daily As needed.    Historical Provider, MD   budesonide-formoterol (SYMBICORT) 160-4.5 MCG/ACT AERO Inhale 2 puffs into the lungs 2 times daily Rinse mouth or brush teeth after each use. 04/15/20   [name], DO   alendronate (FOSAMAX) 70 MG tablet Take 1 tablet by mouth every 7 days 04/11/20   [name], DO   CRANBERRY PO Take by mouth    Historical Provider, MD   Cholecalciferol (VITAMIN D3) 50 MCG (2000 UT) CAPS Take by mouth    Historical Provider, MD   Handicap Placard MISC by Does not apply route Issue parking placard for person  with disability; Applicant meets the qualifying disability criteria. Length of time expected to have disability.  Prescription expires in 5 years from issuing date. 07/30/19   [name], DO   CALCIUM CARBONATE-VITAMIN D PO Take 600 mg by mouth 2 times daily Contains 600 mg Calcium & 800 IU Vitamin D3.    Historical Provider, MD   Multiple Vitamins-Minerals (THERAPEUTIC MULTIVITAMIN-MINERALS) tablet Take 1 tablet by mouth daily    Historical Provider, MD   albuterol sulfate HFA (PROVENTIL;VENTOLIN;PROAIR) 108 (90 Base) MCG/ACT inhaler Ventolin HFA 90 mcg/actuation aerosol inhaler   Inhale 2 puffs every 6 hours by inhalation route as needed.   ASTHMA    Historical Provider,  MD   polyvinyl alcohol-povidone (HYPOTEARS) 1.4-0.6 % ophthalmic solution Place 1-2 drops into both eyes as needed.    Historical Provider, MD       Allergies:  Morphine, Pcn [penicillins], Zithromax [azithromycin], and Adhesive tape    Social History:   reports that she quit smoking about 32 years ago. Her smoking use included cigarettes. She started smoking about 67 years ago. She has a 40.00 pack-year smoking history. She has never used smokeless tobacco. She reports that she does not drink alcohol and does not use drugs.     Family History:   Positive for early CAD    REVIEW OF SYSTEMS:    As above or non contributor    PHYSICAL EXAM:    As above in office note      Labs:     CBC: No results for input(s): WBC, HGB, HCT, PLT in the last 72 hours.  BMP: No results for input(s): NA, K, CO2, BUN, CREATININE, LABGLOM, GLUCOSE in the last 72 hours.  BNP: No results for input(s): BNP in the last 72 hours.  PT/INR: No results for input(s): PROTIME, INR in the last 72 hours.  APTT:No results for input(s): APTT in the last 72 hours.  CARDIAC ENZYMES:No results for input(s): CKTOTAL, CKMB, CKMBINDEX, TROPONINI in the last 72 hours.  FASTING LIPID PANEL:  Lab Results   Component Value Date/Time    HDL 57 11/05/2019 08:55 AM    LDLCALC 48.0 12/03/2018 09:10 AM    TRIG 156 11/05/2019 08:55 AM     LIVER PROFILE:No results for input(s): AST, ALT, LABALBU in the last 72 hours.      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    Pain of both sacroiliac joints    Piriformis syndrome of left side    Lumbar radicular pain    Neural foraminal stenosis of lumbar spine    Chronic low back pain    Spinal stenosis, lumbar    Diverticulitis    Acquired absence of other specified parts of digestive tract     Arthrodesis status    Asymptomatic menopausal state    Extravasation of urine    Gastro-esophageal reflux disease without esophagitis    Hyperlipidemia, unspecified    Closed compression fracture of body of L1 vertebra (HCC)    Lumbar burst fracture, open, initial encounter ([place])    Pharyngoesophageal dysphagia    Anxiety    Tremors of nervous system    Benign essential tremor    Dizziness    Memory problem  Balance problems    Chronic cerebral ischemia    Neck stiffness    Tingling sensation    Left arm weakness    Dysphagia    Brain dysfunction    Carotid stenosis, asymptomatic, bilateral    Knee pain    Abdominal pain    COVID-19    Diarrhea    Enteritis    Fatigue    Osteoarthrosis    Otalgia    Otalgia, bilateral    Pain in left arm       RECOMMENDATIONS:  Heart catheterization for risk stratification and continue to have chest pain    Discussed with patient and nursing.      [  x ] I personally reviewed and examined the patient to confirm the above information    Electronically signed by Osker Mason, MD on 12/21/2020 at 6:58 [facility]        [phone]

## 2020-12-30 ENCOUNTER — Ambulatory Visit: Payer: MEDICARE | Primary: Family Medicine

## 2020-12-31 ENCOUNTER — Ambulatory Visit
Admit: 2020-12-31 | Discharge: 2020-12-31 | Payer: MEDICARE | Attending: Cardiovascular Disease | Primary: Family Medicine

## 2020-12-31 DIAGNOSIS — R079 Chest pain, unspecified: Secondary | ICD-10-CM

## 2020-12-31 NOTE — Progress Notes (Signed)
[place]  [place]  [place]  [place] [address]  Dept: [phone]    Subjective:  The patient is a 80 y.o. year old, Caucasian, female is in the office for a follow up after heart cath on 12/21/2020.  She denies any chest pain or discomfort. No orthopnea or PND. Denies any palpitation, dizziness or syncope. She is completely asymptomatic from cardiac stand point.  Patient do have a left arm pain which increases with movement  Patient also feels weak tired  According to the patient and daughter patient takes propranolol for tremors she has been cutting down morning dose of propranolol to 20 and 40 at night  Today the systolic blood pressure is 90    Past Medical History:   has a past medical history of Basal cell carcinoma (BCC) of face, Breast cancer ([place]), Constipation, COPD (chronic obstructive pulmonary disease) ([place]), DDD (degenerative disc disease), Large bowel obstruction ([place]), LBP (low back pain), Pharyngoesophageal dysphagia, and SS (spinal stenosis).    Past Surgical History:   has a past surgical history that includes Hysterectomy (1980); bladder suspension (1990); Cholecystectomy (1990); Breast lumpectomy (Right, 1999); other surgical history (11/14/13, 06/23/11, 06/30/11, 07/12/11 12/29/11); other surgical history (12/17/2013); other surgical history (Bilateral, 05/30/2014); other surgical history (Bilateral, 09/16/2014); other surgical history (Bilateral, 09/26/2014); other surgical history (10/10/2014); other surgical history (10/31/2014); Tonsillectomy (1946); Appendectomy (1956); Small intestine surgery (02/20/2015); Femoral hernia repair (Left, 02/20/2015); Excision/Biopsy (Left, 11/01/2018); Lumbar spine surgery (01/03/2019); Spine surgery (N/A, 01/03/2019); Upper gastrointestinal endoscopy (N/A, 04/24/2019); and Cardiac catheterization (12/21/2020).    Home  Medications:  Prior to Admission medications    Medication Sig Start Date End Date Taking? Authorizing Provider   propranolol (INDERAL) 40 MG tablet Take 1 tablet by mouth 2 times daily 12/18/20  Yes [name], DO   omeprazole (PRILOSEC) 20 MG delayed release capsule Take 1 capsule by mouth every morning (before breakfast) 11/27/20  Yes [name], DO   LORazepam (ATIVAN) 0.5 MG tablet Take 1 tablet by mouth nightly for 90 days. 12/14/20 03/14/21 Yes [name], DO   donepezil (ARICEPT) 5 MG tablet Take 1 tablet by mouth nightly 11/03/20  Yes [name], DO   citalopram (CELEXA) 20 MG tablet Take 1.5 tablets by mouth daily 10/01/20  Yes [name], DO   Probiotic Acidophilus (FLORANEX) TABS Take 1 tablet by mouth daily   Yes Historical Provider, MD   polyethylene glycol (GLYCOLAX) 17 GM/SCOOP powder Take 17 g by mouth daily As needed.   Yes Historical Provider, MD   budesonide-formoterol (SYMBICORT) 160-4.5 MCG/ACT AERO Inhale 2 puffs into the lungs 2 times daily Rinse mouth or brush teeth after each use. 04/15/20  Yes [name], DO   alendronate (FOSAMAX) 70 MG tablet Take 1 tablet by mouth every 7 days 04/11/20  Yes [name], DO   CRANBERRY PO Take by mouth   Yes Historical Provider, MD   Cholecalciferol (VITAMIN D3) 50 MCG (2000 UT) CAPS Take by mouth   Yes Historical Provider, MD   Handicap Placard MISC by Does not apply route Issue parking placard for person with disability; Applicant meets the qualifying disability criteria. Length of time expected to have disability.  Prescription expires in 5 years from issuing date. 07/30/19  Yes [name], DO   CALCIUM CARBONATE-VITAMIN D PO Take 600 mg by mouth 2 times daily Contains  600 mg Calcium & 800 IU Vitamin D3.   Yes Historical Provider, MD   Multiple Vitamins-Minerals (THERAPEUTIC MULTIVITAMIN-MINERALS) tablet Take 1 tablet by mouth daily   Yes Historical Provider, MD   albuterol sulfate HFA (PROVENTIL;VENTOLIN;PROAIR) 108 (90 Base) MCG/ACT  inhaler Ventolin HFA 90 mcg/actuation aerosol inhaler   Inhale 2 puffs every 6 hours by inhalation route as needed.   ASTHMA   Yes Historical Provider, MD   polyvinyl alcohol-povidone (HYPOTEARS) 1.4-0.6 % ophthalmic solution Place 1-2 drops into both eyes as needed.   Yes Historical Provider, MD       Allergies:  Morphine, Pcn [penicillins], Zithromax [azithromycin], and Adhesive tape    Social History:   reports that she quit smoking about 32 years ago. Her smoking use included cigarettes. She started smoking about 68 years ago. She has a 40.00 pack-year smoking history. She has never used smokeless tobacco. She reports that she does not drink alcohol and does not use drugs.    Review of Systems:  Constitutional: there has been no unanticipated weight loss. There's been No change in energy level, No change in activity level.     Eyes: No visual changes or diplopia. No scleral icterus.  ENT: No Headaches, hearing loss or vertigo. No mouth sores or sore throat.  Cardiovascular: As above.  Respiratory: No SOB, cough or hemoptysis.  Gastrointestinal: No abdominal pain, appetite loss, blood in stools. No change in bowel or bladder habits.  Genitourinary: No dysuria, trouble voiding, or hematuria.  Musculoskeletal:  No gait disturbance, No weakness or joint complaints.  Integumentary: No rash or pruritis.  Psychiatric: No anxiety, or depression.  Hematologic/Lymphatic: No abnormal bruising or bleeding, blood clots or swollen lymph nodes.  Allergic/Immunologic: No nasal congestion or hives.    Physical Exam:  BP 90/68    Pulse 53    Ht 5\' 4"  (1.626 m)    Wt 138 lb (62.6 kg)    LMP  (LMP Unknown)    SpO2 95%    BMI 23.69 kg/m??     Constitutional and General Appearance: alert, cooperative, no distress and appears stated age  61: PERRL, no cervical lymphadenopathy. No masses palpable. Normal oral mucosa  Respiratory:  Normal excursion and expansion without use of accessory muscles  Resp Auscultation: Good respiratory  effort. No for increased work of breathing. On auscultation: clear to auscultation bilaterally  Cardiovascular:  The apical impulse is not displaced  Heart tones are crisp and normal. regular S1 and S2.  Jugular venous pulsation Normal  The carotid upstroke is normal in amplitude and contour without delay or bruit  Peripheral pulses are symmetrical and full   Abdomen:   No masses or tenderness  Bowel sounds present  Extremities:   No Cyanosis or Clubbing   Lower extremity edema: No   Skin: Warm and dry    Cardiac Data:    Echo 12/16/20  Summary  Normal left ventricular diameter.  Borderline left ventricular hypertrophy.  Left ventricular systolic function is normal.  Left ventricular ejection fraction 65 %.  Grade II (moderate) left ventricular diastolic dysfunction.  Right atrial dilatation.  Aortic valve sclerosis without stenosis.  Trivial aortic regurgitation.  Normal mitral valve leaflets.  Mitral annular calcification.  Mild mitral regurgitation.  Normal tricuspid valve leaflets.    Cath 12/21/20  Findings:  Left main: NL  LAD: 20% mid area  D1: proximal 70% stenosis 9Small branch)  LCX: NL  RCA: Mid 25% stenosis  The LV gram was performed in the RAO  30 position.   LVEF: 55%. LV Wall Motion: Normal     Conclusions:  Normal / Non obstructive CAD  D1 disease but small branch.  Overall preserved LV function       Labs:     CBC: No results for input(s): WBC, HGB, HCT, PLT in the last 72 hours.  BMP: No results for input(s): NA, K, CO2, BUN, CREATININE, LABGLOM, GLUCOSE in the last 72 hours.  PT/INR: No results for input(s): PROTIME, INR in the last 72 hours.  FASTING LIPID PANEL:  Lab Results   Component Value Date/Time    CHOL 159 11/05/2019 08:55 AM    HDL 57 11/05/2019 08:55 AM    LDLCHOLESTEROL 71 11/05/2019 08:55 AM    TRIG 156 11/05/2019 08:55 AM    CHOLHDLRATIO 2.8 11/05/2019 08:55 AM     LIVER PROFILE:No results for input(s): AST, ALT, LABALBU in the last 72 hours.      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    Pain of both sacroiliac joints    Piriformis syndrome of left side    Lumbar radicular pain    Neural foraminal stenosis of lumbar spine    Chronic low back pain    Spinal stenosis, lumbar    Diverticulitis    Acquired absence of other specified parts of digestive tract    Arthrodesis status    Asymptomatic menopausal state    Extravasation of urine    Gastro-esophageal reflux disease without esophagitis    Hyperlipidemia, unspecified    Closed compression fracture of body of L1 vertebra (HCC)    Lumbar burst fracture, open, initial encounter ([place])    Pharyngoesophageal dysphagia    Anxiety    Tremors of nervous system    Benign essential tremor    Dizziness    Memory problem    Balance problems    Chronic cerebral ischemia    Neck stiffness    Tingling sensation    Left arm weakness    Dysphagia    Brain dysfunction    Carotid stenosis, asymptomatic, bilateral    Knee pain    Abdominal pain    COVID-19    Diarrhea    Enteritis    Fatigue    Osteoarthrosis    Otalgia    Otalgia, bilateral    Pain in left arm       RECOMMENDATIONS:   CAD- History of heart cath details as above noncritical lesion, D1 small artery not amenable for any stents  NO CHEST PAIN, CONTINUE CURRENT MEDICATIONS       HYPERTENSION-on lower side patient is advised to discontinue morning dose of propanolol and continue evening dose  Continue to watch blood pressure heart rate       HYPERLIPIDEMIA-last lipid check was on October 2021 LDL was 70 total cholesterol 156  I advised to check lipid profile again if it is drastically change patient can start on statins    Advised for any fall precautions    DISCUSSED IN DETAILS ABOUT RISK MODIFICATION    RETURN VISIT IN 6 MONTHS, IF ANY SYMPTOM CHANGE PATIENT ADVISED TO GO TO THE EMERGENCY ROOM.          Alroy Dust, MD, MD  [facility]  Consult           [phone]

## 2021-01-01 ENCOUNTER — Ambulatory Visit: Payer: MEDICARE | Attending: Vascular Surgery | Primary: Family Medicine

## 2021-01-05 ENCOUNTER — Ambulatory Visit: Admit: 2021-01-05 | Discharge: 2021-01-05 | Payer: MEDICARE | Attending: Family Medicine | Primary: Family Medicine

## 2021-01-05 DIAGNOSIS — I2510 Atherosclerotic heart disease of native coronary artery without angina pectoris: Secondary | ICD-10-CM

## 2021-01-05 DIAGNOSIS — R251 Tremor, unspecified: Secondary | ICD-10-CM

## 2021-01-05 MED ORDER — DOXYCYCLINE HYCLATE 100 MG PO TABS
100 MG | ORAL_TABLET | Freq: Two times a day (BID) | ORAL | 0 refills | Status: AC
Start: 2021-01-05 — End: 2021-01-12

## 2021-01-05 MED ORDER — PREDNISONE 20 MG PO TABS
20 MG | ORAL_TABLET | Freq: Every day | ORAL | 0 refills | Status: DC
Start: 2021-01-05 — End: 2021-04-13

## 2021-01-05 NOTE — Progress Notes (Signed)
[name]  [address] [place] [address]  [phone]      Katie Juarez is a 80 y.o. female who presents today for her medical conditions/complaints as noted below.  Katie Juarez is c/o of Tremors, Sweats, and Joint Pain      HPI:     Pt here today for follow-up of tremors.    Pt was having low BP and fatigue at last visit with Cardio on 12/22 (90/68); she had already been taking lower dose of Propanolol.  Was recommended to stop AM dose and continue only evening dose.    Was they recommended she stop her AM dose of Propanolol and take only 40 mg nightly - she is already less tired.  BP well-controlled today - 112/72.  Unsure if the tremor is much different, but would like to stay on this a little longer.    Was not started on any new meds by Cardio after cath    Taking Tylenol as needed for shoulder and low back pain - takes maybe 2 doses per day.  Still having L shoulder pain, but not as much radiation down the arm or into the chest.      Cough  This is a new problem. The current episode started 1 to 4 weeks ago (3-4 weeks ago). The cough is Productive of sputum (usually white, but sometimes now it is green). Associated symptoms include postnasal drip, rhinorrhea (chronic), shortness of breath (intermittent) and wheezing. Pertinent negatives include no fever or sore throat.     Taking a stool softener every night has helped move her bowels more regularly; no longer having to use the Miralax every day.  Still has a lot of gas, which is not painful but "irritating".      Sweating has been improved; still has some when she really exerts herself with housework.  Wants to get out and walk for exercise when she gets down to [place] next week.        Past Medical History:   Diagnosis Date    Basal cell carcinoma (BCC) of face 10/2018    excised by Dr Yehuda Savannah     Breast cancer [facility]) 1999    in remission    Constipation 12/27/2018    COPD (chronic obstructive pulmonary  disease) ([place])     DDD (degenerative disc disease)     Large bowel obstruction ([place]) 02/2015    [facility]    LBP (low back pain)     Pharyngoesophageal dysphagia     SS (spinal stenosis)       Past Surgical History:   Procedure Laterality Date    [place]    BREAST LUMPECTOMY Right 1999    CARDIAC CATHETERIZATION  12/21/2020    Dr Lowell Bouton    CHOLECYSTECTOMY  1990    EXCISION/BIOPSY Left 11/01/2018    Dr Yehuda Savannah [name], for [place] Left 02/20/2015    strangulated with small bowel resection - [facility],  [facility], [place] ([place])  1980    Done for endometriosis; done in [place]  01/03/2019    VERTEBRAL AUMENTATION  L1      OTHER SURGICAL HISTORY  11/14/13, 06/23/11, 06/30/11, 07/12/11 12/29/11    L4/L5 IESI    OTHER SURGICAL HISTORY  12/17/2013    L5/S1 IESI  OTHER SURGICAL HISTORY Bilateral 05/30/2014    SIJ and piriformis    OTHER SURGICAL HISTORY Bilateral 09/16/2014    L3, L4, L5 Diagnostic Medial Branch Block    OTHER SURGICAL HISTORY Bilateral 09/26/2014    L5 TFE    OTHER SURGICAL HISTORY  10/10/2014    bil L5 TFE    OTHER SURGICAL HISTORY  10/31/2014    caudal    SMALL INTESTINE SURGERY  02/20/2015    strangulated left femoral hernia    SPINE SURGERY N/A 01/03/2019    VERTEBRAL AUMENTATION  L1  (C-ARM X2, performed by Loura Back, MD at [facility] N/A 04/24/2019    EGD BIOPSY performed by Monna Fam, MD at [facility] History   Problem Relation Age of Onset    Other Mother         ALS - diagnosed at age 45    Stroke Father         age 39    Cancer Sister         pt thinks it was liver    Cancer Brother         leukemia    Diabetes Maternal Grandfather     Other Brother         drowned    Other Brother         died at age 67; think due to spinal meningitis    Lupus Sister     Alzheimer's Disease Sister      Dementia Sister     Mult Sclerosis Nephew     Mult Sclerosis Daughter      Social History     Tobacco Use    Smoking status: Former     Packs/day: 1.00     Years: 40.00     Pack years: 40.00     Types: Cigarettes     Start date: 01/10/1953     Quit date: 01/11/1988     Years since quitting: 33.0    Smokeless tobacco: Never    Tobacco comments:     quit 25 years ago 1990   Substance Use Topics    Alcohol use: No     Alcohol/week: 0.0 standard drinks      Current Outpatient Medications   Medication Sig Dispense Refill    omeprazole (PRILOSEC) 20 MG delayed release capsule Take 1 capsule by mouth every morning (before breakfast) 90 capsule 1    LORazepam (ATIVAN) 0.5 MG tablet Take 1 tablet by mouth nightly for 90 days. 90 tablet 0    donepezil (ARICEPT) 5 MG tablet Take 1 tablet by mouth nightly 90 tablet 1    citalopram (CELEXA) 20 MG tablet Take 1.5 tablets by mouth daily 135 tablet 1    Probiotic Acidophilus (FLORANEX) TABS Take 1 tablet by mouth daily      polyethylene glycol (GLYCOLAX) 17 GM/SCOOP powder Take 17 g by mouth daily As needed.      budesonide-formoterol (SYMBICORT) 160-4.5 MCG/ACT AERO Inhale 2 puffs into the lungs 2 times daily Rinse mouth or brush teeth after each use. 30.6 g 3    alendronate (FOSAMAX) 70 MG tablet Take 1 tablet by mouth every 7 days 12 tablet 3    CRANBERRY PO Take by mouth      Cholecalciferol (VITAMIN D3) 50 MCG (2000 UT) CAPS Take by mouth      Handicap Placard MISC  by Does not apply route Issue parking placard for person with disability; Applicant meets the qualifying disability criteria. Length of time expected to have disability.  Prescription expires in 5 years from issuing date. 1 each 0    CALCIUM CARBONATE-VITAMIN D PO Take 600 mg by mouth 2 times daily Contains 600 mg Calcium & 800 IU Vitamin D3.      Multiple Vitamins-Minerals (THERAPEUTIC MULTIVITAMIN-MINERALS) tablet Take 1 tablet by mouth daily      albuterol sulfate HFA (PROVENTIL;VENTOLIN;PROAIR) 108 (90 Base)  MCG/ACT inhaler Ventolin HFA 90 mcg/actuation aerosol inhaler   Inhale 2 puffs every 6 hours by inhalation route as needed.   ASTHMA      polyvinyl alcohol-povidone (HYPOTEARS) 1.4-0.6 % ophthalmic solution Place 1-2 drops into both eyes as needed.      propranolol (INDERAL) 40 MG tablet Take 1 tablet by mouth 2 times daily (Patient taking differently: Take 40 mg by mouth at bedtime) 60 tablet 0     No current facility-administered medications for this visit.     Allergies   Allergen Reactions    Morphine Other (See Comments)     Night sweats,felt "loopy".    Pcn [Penicillins] Hives    Zithromax [Azithromycin] Hives    Adhesive Tape Rash       Health Maintenance   Topic Date Due    COVID-19 Vaccine (4 - Booster for Moderna series) 01/16/2020    Annual Wellness Visit (AWV)  05/07/2021    Depression Screen  08/04/2021    DTaP/Tdap/Td vaccine (2 - Td or Tdap) 09/28/2027    DEXA (modify frequency per FRAX score)  Completed    Flu vaccine  Completed    Shingles vaccine  Completed    Pneumococcal 65+ years Vaccine  Completed    Hepatitis A vaccine  Aged Out    Hib vaccine  Aged Out    Meningococcal (ACWY) vaccine  Aged Out       Subjective:      Review of Systems   Constitutional:  Negative for fever.   HENT:  Positive for postnasal drip and rhinorrhea (chronic). Negative for sinus pressure and sore throat.    Respiratory:  Positive for cough (with chest congestion), shortness of breath (intermittent) and wheezing.    Gastrointestinal:  Negative for diarrhea and nausea.     Objective:     Vitals:    01/05/21 1431   BP: 112/72   Site: Right Upper Arm   Position: Sitting   Cuff Size: Medium Adult   Pulse: 67   Temp: 98.1 ??F (36.7 ??C)   TempSrc: Temporal   SpO2: 93%   Weight: 137 lb (62.1 kg)   Height: 5\' 4"  (1.626 m)     Physical Exam  Constitutional:       General: She is not in acute distress.     Appearance: Normal appearance.   HENT:      Head: Normocephalic and atraumatic.   Eyes:      Conjunctiva/sclera: Conjunctivae  normal.   Cardiovascular:      Rate and Rhythm: Normal rate and regular rhythm.      Heart sounds: Normal heart sounds.   Pulmonary:      Effort: Pulmonary effort is normal. No respiratory distress.      Breath sounds: Normal breath sounds.   Abdominal:      General: Bowel sounds are normal. There is no distension.      Palpations: Abdomen is soft.      Tenderness:  There is no abdominal tenderness.   Musculoskeletal:      Right lower leg: No edema.      Left lower leg: No edema.   Skin:     General: Skin is warm and dry.   Neurological:      General: No focal deficit present.      Mental Status: She is alert and oriented to person, place, and time.   Psychiatric:         Mood and Affect: Mood normal.       Assessment:      1. Tremor  2. Mild CAD  -     Lipid Panel; Future  3. Bronchitis  -     predniSONE (DELTASONE) 20 MG tablet; Take 2 tablets by mouth daily for 4 days, Disp-8 tablet, R-0Normal  -     doxycycline hyclate (VIBRA-TABS) 100 MG tablet; Take 1 tablet by mouth 2 times daily for 7 days, Disp-14 tablet, R-0Normal  4. Chronic obstructive pulmonary disease, unspecified COPD type (HCC)  5. Pain of right sacroiliac joint  6. Impaired fasting glucose  -     Hemoglobin A1C; Future       Plan:      Return in about 3 months (around 04/19/2021) for f/u tremors, BP, sweats - late AM or early afternoon appt.    Orders Placed This Encounter   Procedures    Lipid Panel     Standing Status:   Future     Standing Expiration Date:   01/05/2022    Hemoglobin A1C     Standing Status:   Future     Standing Expiration Date:   01/05/2022     Orders Placed This Encounter   Medications    predniSONE (DELTASONE) 20 MG tablet     Sig: Take 2 tablets by mouth daily for 4 days     Dispense:  8 tablet     Refill:  0    doxycycline hyclate (VIBRA-TABS) 100 MG tablet     Sig: Take 1 tablet by mouth 2 times daily for 7 days     Dispense:  14 tablet     Refill:  0       Patient given educational materials - see patient instructions.   Discussed use, benefit, and side effects of prescribed medications.  All patient questions answered.  Pt voiced understanding.  Reviewed health maintenance.            Electronically signed by [name], DO, DO on 01/18/2021 at 12:14 AM

## 2021-02-02 ENCOUNTER — Encounter

## 2021-02-02 NOTE — Telephone Encounter (Signed)
Pt filled her Ativan 0.5 mg tabs (#90 tabs) on 12/5 before she left for [place] - did she take the entire supply with her?  If so, she should not yet need refill.

## 2021-02-02 NOTE — Telephone Encounter (Signed)
Last OV  01/05/2021  Next OV 04/13/2021  Verified In Epic

## 2021-02-03 MED ORDER — BUDESONIDE-FORMOTEROL FUMARATE 160-4.5 MCG/ACT IN AERO
Freq: Two times a day (BID) | RESPIRATORY_TRACT | 0 refills | Status: DC
Start: 2021-02-03 — End: 2021-02-08

## 2021-02-03 MED ORDER — DONEPEZIL HCL 5 MG PO TABS
5 MG | ORAL_TABLET | Freq: Every evening | ORAL | 0 refills | Status: DC
Start: 2021-02-03 — End: 2021-04-13

## 2021-02-03 NOTE — Telephone Encounter (Signed)
Spoke with daughter and she checked her bottle and she is good

## 2021-02-04 ENCOUNTER — Telehealth

## 2021-02-04 NOTE — Telephone Encounter (Signed)
Pt's daughter calling stating pt is on Symbicort and it's very expensive and they are questioning if there is anything cheaper, pt uses pended pharmacy, please advise.

## 2021-02-04 NOTE — Telephone Encounter (Signed)
Writer starting PA.

## 2021-02-05 NOTE — Telephone Encounter (Signed)
PA denied. Will give KB the list of approval medication. Placed in [place] box.

## 2021-02-08 MED ORDER — UMECLIDINIUM-VILANTEROL 62.5-25 MCG/ACT IN AEPB
Freq: Every day | RESPIRATORY_TRACT | 1 refills | Status: DC
Start: 2021-02-08 — End: 2021-04-10

## 2021-02-08 NOTE — Telephone Encounter (Signed)
Left detailed message regarding the inhaler.

## 2021-02-08 NOTE — Telephone Encounter (Signed)
New Rx for Anoro sent to pharmacy in [place] - please inform pt and daughter.

## 2021-02-27 ENCOUNTER — Encounter

## 2021-02-27 NOTE — Telephone Encounter (Signed)
Daughter called in from [place] asking for a refill on Omeprazole. They would like it sent to the pharmacy in [place], [place] on [address]

## 2021-02-27 NOTE — Telephone Encounter (Signed)
Pt should have just filled #90 tabs on 1/24 per Dispense Report - does she need another refill to [facility] or will that last her until she gets home?

## 2021-03-01 NOTE — Telephone Encounter (Signed)
See refill encounter 02/27/21.

## 2021-03-01 NOTE — Telephone Encounter (Signed)
Pt does have enough donepezil, no refill needed at this time. Does need omeprazole. Called to [facility], states they have it ready for pt but has not been picked up. Asked if they can send it to the [facility] in [place], states no, the pharm needs to request it from them. Called [facility] and spoke with Mikki Santee. Updated on situation, states there is no refills. Advised the refill was filled in [place], pt will not be picking it up and they are putting it back. Mikki Santee still unable to process the request. Will need new rx sent.     [facility] called requesting a refill of the below medication which has been pended for you:     Requested Prescriptions     Pending Prescriptions Disp Refills    omeprazole (PRILOSEC) 20 MG delayed release capsule 90 capsule 1     Sig: Take 1 capsule by mouth every morning (before breakfast)     Refused Prescriptions Disp Refills    donepezil (ARICEPT) 5 MG tablet [Pharmacy Med Name: Donepezil HCl 5 MG Oral Tablet] 90 tablet 0     Sig: Take 1 tablet by mouth nightly       Last Appointment Date: 01/05/2021  Next Appointment Date: 04/13/2021    Allergies   Allergen Reactions    Morphine Other (See Comments)     Night sweats,felt "loopy".    Pcn [Penicillins] Hives    Zithromax [Azithromycin] Hives    Adhesive Tape Rash

## 2021-03-03 MED ORDER — OMEPRAZOLE 20 MG PO CPDR
20 MG | ORAL_CAPSULE | Freq: Every day | ORAL | 0 refills | Status: DC
Start: 2021-03-03 — End: 2021-06-10

## 2021-03-08 ENCOUNTER — Encounter

## 2021-03-08 NOTE — Telephone Encounter (Signed)
Pt can wait until tomorrow.

## 2021-03-08 NOTE — Telephone Encounter (Signed)
Per [place], last fill 12/5, quantity 90 for 90 days.      Patient can wait until KB returns on 2/28. Confirmed with patient that she is still in [place] and needs rx to be sent to pharm pended.       [facility] called requesting a refill of the below medication which has been pended for you:     Requested Prescriptions     Pending Prescriptions Disp Refills    LORazepam (ATIVAN) 0.5 MG tablet 90 tablet 0     Sig: Take 1 tablet by mouth nightly for 90 days.       Last Appointment Date: 01/05/2021  Next Appointment Date: 04/13/2021    Allergies   Allergen Reactions    Morphine Other (See Comments)     Night sweats,felt "loopy".    Pcn [Penicillins] Hives    Zithromax [Azithromycin] Hives    Adhesive Tape Rash

## 2021-03-10 NOTE — Telephone Encounter (Signed)
Controlled Substance Monitoring:    Acute and Chronic Pain Monitoring:   RX Monitoring 03/10/2021   Periodic Controlled Substance Monitoring No signs of potential drug abuse or diversion identified.    Obtained or confirmed "Medication Contract" on file.

## 2021-03-11 MED ORDER — LORAZEPAM 0.5 MG PO TABS
0.5 MG | ORAL_TABLET | Freq: Every evening | ORAL | 0 refills | Status: DC
Start: 2021-03-11 — End: 2021-06-10

## 2021-04-06 ENCOUNTER — Encounter

## 2021-04-06 NOTE — Telephone Encounter (Signed)
Fax received from [facility]

## 2021-04-06 NOTE — Telephone Encounter (Signed)
Spoke with pt, she is home now in [place] and needs refill. Spoke with [employer], can see the medication but when they request the transfer it states she is out of refills and will need new rx sent in.    Katie Juarez called requesting a refill of the below medication which has been pended for you:     Requested Prescriptions     Pending Prescriptions Disp Refills    umeclidinium-vilanterol (ANORO ELLIPTA) 62.5-25 MCG/ACT inhaler 90 each 1     Sig: Inhale 1 puff into the lungs daily       Last Appointment Date: 01/05/2021  Next Appointment Date: 04/13/2021    Allergies   Allergen Reactions    Morphine Other (See Comments)     Night sweats,felt "loopy".    Pcn [Penicillins] Hives    Zithromax [Azithromycin] Hives    Adhesive Tape Rash

## 2021-04-08 ENCOUNTER — Inpatient Hospital Stay: Payer: MEDICARE | Primary: Family Medicine

## 2021-04-08 DIAGNOSIS — R7301 Impaired fasting glucose: Secondary | ICD-10-CM

## 2021-04-09 LAB — LIPID PANEL
Chol/HDL Ratio: 5 — ABNORMAL HIGH (ref <5)
Cholesterol: 236 mg/dL — ABNORMAL HIGH (ref <200)
HDL: 47 mg/dL (ref >40)
LDL Cholesterol: 114 mg/dL (ref 0–130)
Triglycerides: 375 mg/dL — ABNORMAL HIGH (ref <150)

## 2021-04-09 LAB — HEMOGLOBIN A1C
Estimated Avg Glucose: 120 mg/dL
Hemoglobin A1C: 5.8 % (ref 4.0–6.0)

## 2021-04-09 NOTE — Telephone Encounter (Signed)
error 

## 2021-04-10 MED ORDER — UMECLIDINIUM-VILANTEROL 62.5-25 MCG/ACT IN AEPB
Freq: Every day | RESPIRATORY_TRACT | 1 refills | Status: DC
Start: 2021-04-10 — End: 2021-06-11

## 2021-04-13 ENCOUNTER — Ambulatory Visit: Admit: 2021-04-13 | Discharge: 2021-04-13 | Payer: MEDICARE | Attending: Family Medicine | Primary: Family Medicine

## 2021-04-13 DIAGNOSIS — F419 Anxiety disorder, unspecified: Secondary | ICD-10-CM

## 2021-04-13 DIAGNOSIS — J449 Chronic obstructive pulmonary disease, unspecified: Secondary | ICD-10-CM

## 2021-04-13 MED ORDER — DONEPEZIL HCL 5 MG PO TABS
5 MG | ORAL_TABLET | Freq: Every evening | ORAL | 3 refills | Status: AC
Start: 2021-04-13 — End: 2022-02-16

## 2021-04-13 MED ORDER — ATORVASTATIN CALCIUM 20 MG PO TABS
20 MG | ORAL_TABLET | Freq: Every day | ORAL | 1 refills | Status: DC
Start: 2021-04-13 — End: 2021-12-15

## 2021-04-13 MED ORDER — CITALOPRAM HYDROBROMIDE 20 MG PO TABS
20 MG | ORAL_TABLET | Freq: Every day | ORAL | 3 refills | Status: AC
Start: 2021-04-13 — End: 2022-04-02

## 2021-04-13 MED ORDER — PREDNISONE 20 MG PO TABS
20 MG | ORAL_TABLET | ORAL | 0 refills | Status: DC
Start: 2021-04-13 — End: 2021-06-14

## 2021-04-13 NOTE — Progress Notes (Signed)
[facility]  [address] [place] [address]  [phone]      Katie Juarez is a 81 y.o. female who presents today for her medical conditions/complaints as noted below.  Katie Juarez is c/o of Tremors (Not taking medication for the tremors), Hypertension (States bp seems to be doing ok), and Lower Back Pain (Right lower back pain down to right leg)      HPI:     Pt here today for follow-up of tremors and low back pain.    Having pain from her R low back and down the outside of her R leg (infrequently down her L leg) daily to her foot; has mild numbness/tingling  Will take Aleve once or twice daily; has also tried Tylenol or Ibuprofen  Can happen in bed or wake her up if she is turning over  Started to inhibit her being able to walk for exercise while in [place]    Has a couple episodes per week of L-sided chest pain that radiates down her L arm  Can even happen in bed  Doesn't last long; will try to distract herself when it happens  Does not take anything for it  Does not keep her from doing anything, as she is usually at rest when it happens    Pt has not noticed her tremors being as bad; daughter will notice it more when she is concentrating more, like when cooking.  Has not painted recently to know whether it is as bad.  Has not been taking the Propanolol at all x past 3 months.    Still having excessive mucus production that comes up with coughing at night; starts after she gets in bed and lies down  Head of bed is reclined a little; lies on thick pillow          Past Medical History:   Diagnosis Date    Basal cell carcinoma (BCC) of face 10/2018    excised by Dr Yehuda Savannah     Breast cancer [facility]) 1999    in remission    Constipation 12/27/2018    COPD (chronic obstructive pulmonary disease) ([place])     DDD (degenerative disc disease)     Large bowel obstruction ([place]) 02/2015    [facility]    LBP (low back pain)     Pharyngoesophageal dysphagia     SS (spinal  stenosis)       Past Surgical History:   Procedure Laterality Date    [place]    BREAST LUMPECTOMY Right 1999    CARDIAC CATHETERIZATION  12/21/2020    Dr Lowell Bouton    CHOLECYSTECTOMY  1990    EXCISION/BIOPSY Left 11/01/2018    Dr Yehuda Savannah [name], for [place] Left 02/20/2015    strangulated with small bowel resection - [facility],  [facility] [place] ([place])  1980    Done for endometriosis; done in [place]  01/03/2019    VERTEBRAL AUMENTATION  L1      OTHER SURGICAL HISTORY  11/14/13, 06/23/11, 06/30/11, 07/12/11 12/29/11    L4/L5 IESI    OTHER SURGICAL HISTORY  12/17/2013    L5/S1 IESI    OTHER SURGICAL HISTORY Bilateral 05/30/2014    SIJ and piriformis    OTHER SURGICAL HISTORY Bilateral 09/16/2014    L3, L4,  L5 Diagnostic Medial Branch Block    OTHER SURGICAL HISTORY Bilateral 09/26/2014    L5 TFE    OTHER SURGICAL HISTORY  10/10/2014    bil L5 TFE    OTHER SURGICAL HISTORY  10/31/2014    caudal    SMALL INTESTINE SURGERY  02/20/2015    strangulated left femoral hernia    SPINE SURGERY N/A 01/03/2019    VERTEBRAL AUMENTATION  L1  (C-ARM X2, performed by Loura Back, MD at [place] N/A 04/24/2019    EGD BIOPSY performed by Monna Fam, MD at [place] History   Problem Relation Age of Onset    Other Mother         ALS - diagnosed at age 47    Stroke Father         age 62    Cancer Sister         pt thinks it was liver    Cancer Brother         leukemia    Diabetes Maternal Grandfather     Other Brother         drowned    Other Brother         died at age 14; think due to spinal meningitis    Lupus Sister     Alzheimer's Disease Sister     Dementia Sister     Mult Sclerosis Nephew     Mult Sclerosis Daughter      Social History     Tobacco Use    Smoking status: Former     Packs/day: 1.00     Years: 40.00     Pack  years: 40.00     Types: Cigarettes     Start date: 01/10/1953     Quit date: 01/11/1988     Years since quitting: 33.3    Smokeless tobacco: Never    Tobacco comments:     quit 25 years ago 1990   Substance Use Topics    Alcohol use: No     Alcohol/week: 0.0 standard drinks      Current Outpatient Medications   Medication Sig Dispense Refill    citalopram (CELEXA) 20 MG tablet Take 1 tablet by mouth daily 90 tablet 3    predniSONE (DELTASONE) 20 MG tablet Take 2 tabs by mouth daily x 5 days, then 1 tab daily x 3 days. 13 tablet 0    atorvastatin (LIPITOR) 20 MG tablet Take 1 tablet by mouth daily 90 tablet 1    donepezil (ARICEPT) 5 MG tablet Take 1 tablet by mouth nightly 90 tablet 3    umeclidinium-vilanterol (ANORO ELLIPTA) 62.5-25 MCG/ACT inhaler Inhale 1 puff into the lungs daily 90 each 1    LORazepam (ATIVAN) 0.5 MG tablet Take 1 tablet by mouth nightly for 90 days. 90 tablet 0    omeprazole (PRILOSEC) 20 MG delayed release capsule Take 1 capsule by mouth every morning (before breakfast) 90 capsule 0    Probiotic Acidophilus (FLORANEX) TABS Take 1 tablet by mouth daily      alendronate (FOSAMAX) 70 MG tablet Take 1 tablet by mouth every 7 days 12 tablet 3    CRANBERRY PO Take by mouth      Cholecalciferol (VITAMIN D3) 50 MCG (2000 UT) CAPS Take by mouth      Handicap Placard MISC by Does not apply route Issue parking placard  for person with disability; Applicant meets the qualifying disability criteria. Length of time expected to have disability.  Prescription expires in 5 years from issuing date. 1 each 0    CALCIUM CARBONATE-VITAMIN D PO Take 600 mg by mouth 2 times daily Contains 600 mg Calcium & 800 IU Vitamin D3.      Multiple Vitamins-Minerals (THERAPEUTIC MULTIVITAMIN-MINERALS) tablet Take 1 tablet by mouth daily      albuterol sulfate HFA (PROVENTIL;VENTOLIN;PROAIR) 108 (90 Base) MCG/ACT inhaler Ventolin HFA 90 mcg/actuation aerosol inhaler   Inhale 2 puffs every 6 hours by inhalation route as needed.    ASTHMA      polyvinyl alcohol-povidone (HYPOTEARS) 1.4-0.6 % ophthalmic solution Place 1-2 drops into both eyes as needed      polyethylene glycol (GLYCOLAX) 17 GM/SCOOP powder Take 17 g by mouth daily As needed. (Patient not taking: Reported on 04/13/2021)       No current facility-administered medications for this visit.     Allergies   Allergen Reactions    Morphine Other (See Comments)     Night sweats,felt "loopy".    Pcn [Penicillins] Hives    Zithromax [Azithromycin] Hives    Adhesive Tape Rash       Health Maintenance   Topic Date Due    COVID-19 Vaccine (4 - Booster for Moderna series) 01/16/2020    Annual Wellness Visit (AWV)  05/07/2021    Lipids  04/09/2022    Depression Screen  04/14/2022    DTaP/Tdap/Td vaccine (2 - Td or Tdap) 09/28/2027    DEXA (modify frequency per FRAX score)  Completed    Flu vaccine  Completed    Shingles vaccine  Completed    Pneumococcal 65+ years Vaccine  Completed    Hepatitis A vaccine  Aged Out    Hib vaccine  Aged Out    Meningococcal (ACWY) vaccine  Aged Out       Subjective:      Review of Systems   Constitutional:  Negative for fever.   Cardiovascular:  Positive for chest pain.   Gastrointestinal:  Negative for blood in stool.   Genitourinary:  Negative for dysuria, hematuria and vaginal bleeding.     Objective:     Vitals:    04/13/21 1414   BP: 108/68   Site: Right Upper Arm   Position: Sitting   Cuff Size: Large Adult   Pulse: 64   Temp: 97.8 F (36.6 C)   TempSrc: Temporal   SpO2: 92%   Weight: 139 lb (63 kg)   Height: '5\' 4"'$  (1.626 m)     Physical Exam  Constitutional:       General: She is not in acute distress.     Appearance: Normal appearance.   HENT:      Head: Normocephalic and atraumatic.   Eyes:      Conjunctiva/sclera: Conjunctivae normal.   Cardiovascular:      Rate and Rhythm: Normal rate and regular rhythm.      Heart sounds: Normal heart sounds.   Pulmonary:      Effort: Pulmonary effort is normal. No respiratory distress.      Breath sounds: Normal  breath sounds.   Abdominal:      General: Bowel sounds are normal. There is no distension.      Palpations: Abdomen is soft.      Tenderness: There is no abdominal tenderness.   Musculoskeletal:      Right lower leg: No edema.  Left lower leg: No edema.   Skin:     General: Skin is warm and dry.   Neurological:      General: No focal deficit present.      Mental Status: She is alert and oriented to person, place, and time.   Psychiatric:         Mood and Affect: Mood normal.       Assessment:      1. Chronic obstructive pulmonary disease, unspecified COPD type ([place])  2. Alzheimer's disease, unspecified  3. Right lumbar radiculopathy  -     predniSONE (DELTASONE) 20 MG tablet; Take 2 tabs by mouth daily x 5 days, then 1 tab daily x 3 days., Disp-13 tablet, R-0Normal  -     [facility] [place]  4. Lumbar pain with radiation down right leg  -     predniSONE (DELTASONE) 20 MG tablet; Take 2 tabs by mouth daily x 5 days, then 1 tab daily x 3 days., Disp-13 tablet, R-0Normal  -     [facility] [place]  5. Hyperlipidemia, unspecified hyperlipidemia type  -     Lipid Panel; Future  -     atorvastatin (LIPITOR) 20 MG tablet; Take 1 tablet by mouth daily, Disp-90 tablet, R-1Normal  6. Dysphagia, unspecified type  -     [place] MODIFIED BARIUM SWALLOW W VIDEO; Future  7. Memory problem  -     donepezil (ARICEPT) 5 MG tablet; Take 1 tablet by mouth nightly, Disp-90 tablet, R-3Normal  8. Balance problem  -     [facility] [place]  9. Anxiety  -     citalopram (CELEXA) 20 MG tablet; Take 1 tablet by mouth daily, Disp-90 tablet, R-3Normal         Plan:      Return in about 2 months (around 06/13/2021) for f/u swallowing, mucus, back pain, BP.    Orders Placed This Encounter   Procedures    FL MODIFIED BARIUM SWALLOW W VIDEO     Standing Status:   Future     Standing Expiration Date:   04/14/2022     Order Specific Question:   Reason for exam:     Answer:   trouble with  choking; has excessive mucus production that comes up involuntarily from down in her throat, usually with lying down; starts with a cough but then comes up like regurgitation    Lipid Panel     Standing Status:   Future     Standing Expiration Date:   04/14/2022     Order Specific Question:   Is Patient Fasting?/# of Hours     Answer:   71     Order Specific Question:   Has the patient fasted?     Answer:   [facility] [place]     Referral Priority:   Routine     Referral Type:   Eval and Treat     Referral Reason:   Specialty Services Required     Referral Location:   [facility]     Requested Specialty:   Physical Therapist     Number of Visits Requested:   1     Orders Placed This Encounter   Medications    citalopram (CELEXA) 20 MG tablet     Sig: Take 1 tablet by mouth daily     Dispense:  90 tablet  Refill:  3    predniSONE (DELTASONE) 20 MG tablet     Sig: Take 2 tabs by mouth daily x 5 days, then 1 tab daily x 3 days.     Dispense:  13 tablet     Refill:  0    atorvastatin (LIPITOR) 20 MG tablet     Sig: Take 1 tablet by mouth daily     Dispense:  90 tablet     Refill:  1    donepezil (ARICEPT) 5 MG tablet     Sig: Take 1 tablet by mouth nightly     Dispense:  90 tablet     Refill:  3       Patient given educational materials - see patient instructions.  Discussed use, benefit, and side effects of prescribed medications.  All patient questions answered.  Pt voiced understanding.  Reviewed health maintenance.            Electronically signed by [name], DO, DO on 04/26/2021 at 12:28 AM

## 2021-04-29 ENCOUNTER — Inpatient Hospital Stay: Admit: 2021-04-29 | Payer: MEDICARE | Primary: Family Medicine

## 2021-04-29 DIAGNOSIS — R1310 Dysphagia, unspecified: Secondary | ICD-10-CM

## 2021-04-29 MED ORDER — BARIUM SULFATE 98 % PO SUSR
98 % | Freq: Once | ORAL | Status: AC | PRN
Start: 2021-04-29 — End: 2021-04-29
  Administered 2021-04-29: 18:00:00 140 mL via ORAL

## 2021-04-29 NOTE — Progress Notes (Signed)
SPEECH THERAPY  MODIFIED BARIUM SWALLOW STUDY    '[x]'$  Outpatient [facility]  '[]'$  Inpatient- [facility]    Patient: Katie Juarez  Date of Birth: September 22, 1940  Gender: female  MRN:  [id number]  Referring Physician: [name], DO  Diagnosis:  Dysphagia, unspecified type      Date of Study: 04/29/21    History of Present Illness/Injury: Patient is 81 year old female who presents with coughing, especially when drinking liquids. She reports that she "really has to concentrate" when taking a drink or she will begin to cough.     Past Medical History:   Diagnosis Date    Basal cell carcinoma (BCC) of face 10/2018    excised by Dr Yehuda Savannah     Breast cancer [facility]) 1999    in remission    Constipation 12/27/2018    COPD (chronic obstructive pulmonary disease) (HCC)     DDD (degenerative disc disease)     Large bowel obstruction ([place]) 02/2015    [facility]    LBP (low back pain)     Pharyngoesophageal dysphagia     SS (spinal stenosis)         Reported Dysphagia Symptoms:  '[x]'$ Coughing '[]'$ Food catching in throat  '[]'$ Drooling  '[]'$ Reflux               '[]'$ Wet Vocal Quality  '[]'$ Other:     Reason for referral:   '[x]'$ Assess physiology of swallow   '[x]'$ Determine appropriate diet, posture, or compensatory strategies  '[x]'$ Rule out aspiration    '[]'$ Other:      Prior MBSS:  '[]'$ No   '[x]'$ Yes    Date:   05/30/19   Results:    DIAGNOSTIC IMPRESSIONS: Patient presents with oral preparatory phase, oral phase, and pharyngeal phase of swallow WFL.  No penetration.  No aspiration.  No pharyngeal stasis.  Patient at minimal risk of aspiration and pulmonary compromise.    Further esophageal testing via esophagram to further assess esophageal functioning with presence of bolus is warranted as bolus observed to catch/slow to clear esophagus just inferior to UES.      Pain  '[x]'$ No     '[]'$ Yes      Location:  N/A  Pain Rating (0-10 pain scale): 0  Pain Description:  N/A      Current Diet: '[]'$  NPO '[x]'$  Regular diet '[]'$  Soft and bite  sized (Dysphagia III)  '[]'$  Minced and moist (Dysphagia II)   '[]'$  Pureed (Dysphagia I)  '[]'$  Liquidised       Current Liquids: '[x]'$  Thin '[]'$  Mildly Thick (Nectar thick)  '[]'$  Moderately Thick (Honey thick) '[]'$  Extremely Thick (Spoon-Pudding)    Respiratory Status: '[x]'$  Independent '[]'$  Nasal Cannula '[]'$  Oxygen Mask        '[]'$  Tracheostomy '[]'$  Other:       '[]'$  Ventilator/Settings:    Behavioral Observation: '[x]'$  Alert   '[x]'$  Oriented   '[]'$  Confused  '[]'$  Lethargic      '[]'$  Dysarthric  '[]'$  Limited Direction Following  '[]'$  Agitated  '[]'$  Other:    Patient was evaluated using:   '[x]'$  Thin liquid   '[]'$  Mildly thick (Nectar thick) liquid  '[]'$  Moderately thick (Honey thick) liquid/Liquidised   '[]'$  Extremely thick (Pudding thick) liquid      '[x]'$  Solid '[x]'$  Soft and bite sized '[]'$  Minced and moist '[x]'$  Puree  '[]'$  barium tablet      Dentition   '[x]'$ natural, condition:    '[]'$ good  '[x]'$ fair  '[]'$ poor   '[]'$ edentulous   '[]'$   partials:   '[]'$ upper '[]'$ lower '[]'$ loose '[]'$ not worn for MBSS   '[]'$ dentures:   '[]'$ upper '[]'$ lower '[]'$ loose '[]'$ not worn for MBSS        ORAL PREPARATION PHASE: '[x]'$  WFL  '[]'$  Impaired/Reduced   '[]'$  Slow mastication     '[]'$  Uncoordinated mastication    '[]'$  Decreased bolus control   '[]'$  Decreased bolus formation   '[]'$  Loss of bolus from lips / Anterior spillage   '[]'$  OTHER:       ORAL PHASE: '[x]'$  WFL              '[]'$  Impaired/Reduced   '[]'$  Residue in the anterior sulcus                   '[]'$  Residue in the lateral sulcus - right   '[]'$  Residue in the lateral sulcus - left               '[]'$  Uncoordinated AP movement   '[]'$  Slow AP movement                             '[]'$  Decreased lingual elevation   '[]'$  Decreased tongue base retraction               '[]'$  Uncontrolled bolus/diffuse fall over tongue base   '[]'$  Piecemeal deglutition     '[]'$  Base of tongue residue   '[]'$  OTHER:     ORAL PHASE DOSS SCORE: (Dysphagia outcome and severity scale)  '[x]'$  7 = Normal in all situations  '[]'$  6 = WFL / Mod I; Normal diet; May have mild oral delay  '[]'$  5 = Mild dysphagia; May have one diet consistency  restricted; Mild oral residue -clears.  '[]'$  4 = Mild-Moderate dysphagia; May have one or two diet consistencies restricted; Oral residue clears with cue;  Intermittent supervision or cueing.  '[]'$  3 = Moderate dysphagia; Total assist with strategies; Two or more consistencies restricted; Moderate oral residue clears with cue;   '[]'$  2 = Moderately Severe dysphagia; Tolerates at least one consistency safely with total use of strategies; Severe oral residue and unable to clear or needs multiple cues;  Non-oral nutrition.  '[]'$  1 = Severe dysphagia; NPO; Unable to tolerate any po safely; Severe oral loss of bolus or oral residue; Non-oral nutrition.        PHARYNGEAL PHASE: '[x]'$  WFL  '[]'$  Impaired   '[]'$  Absent Swallow                '[]'$  Delayed Swallow               '[]'$  Decreased airway protection   '[]'$  Decreased epiglottic inversion     '[]'$  Decreased hyolaryngeal elevation   '[x]'$  Residue in the valleculae - minimal residue via straw only               '[]'$  Residue in the pyriform sinus    '[]'$  Cricopharyngeal dysfunction              '[x]'$  Residue along posterior pharyngeal wall - trace residue via straw only   '[]'$  Residue along the ariepiglottic folds    '[]'$  Decreased pharyngeal contraction   '[]'$  OTHER:    PHARYNGEAL PHASE DOSS SCORE: (Dysphagia outcome and severity scale)  '[]'$  7 = Normal in all situations; Normal diet; No strategies  '[x]'$  6 = WFL / Mod I; May have mild pharyngeal delay or residue but clears spontaneously; No aspiration or laryngeal  penetration  '[]'$  5 = Mild dysphagia:  May need one consistency restricted; May have one or more of the following:  Aspiration with thin - cough to clear;  Airway penetration midway to the vocal cords with one or more consistency or to the vocal folds with one consistency, but clears spontaneously; Residue in the pharynx clears spontaneously.  '[]'$  4 = Mild - Moderate dysphagia: One or two consistencies restricted; May exhibit one or more of the following:  Residue clears with cue; Aspiration of one  consistency with weak or no reflexive cough; Laryngeal penetration to the vocal cords with cough with two consistencies; Laryngeal penetration to the vocal cords without cough on one consistency.  '[]'$  3 = Moderate dysphagia:  Two or more diet consistencies restricted; May exhibit one or more of the following:  Moderate residue clears with cue; Airway penetration to the level of the vocal folds without cough with two or more consistencies; Aspiration with two consistencies with weak or no reflexive cough; Aspiration of one consistency, no cough and airway penetration with one consistency, no cough.  '[]'$  2 = Moderately Severe dysphagia:  Tolerates at least one consistency safely with total use of strategies; Non-oral nutrition; Severe residue unable to clear; Aspiration with two or more consistencies with no cough; Aspiration with one or more consistency, no cough and airway penetration to the vocal folds with one or more consistency, no cough.  '[]'$  1 = Severe dysphagia:  NPO; Unable to tolerate any po safely; Severe residue unable to clear; Silent aspiration with two or more consistencies, non-functional cough;  OR Unable to achieve a swallow.        SIGNS AND SYMPTOMS OF LARYNGEAL PENETRATION / ASPIRATION:  '[x]'$  No evidence of laryngeal penetration  '[x]'$  No evidence of aspiration  '[]'$  Laryngeal penetration evident with:  '[]'$  Audible aspiration evident with:  '[]'$  Silent aspiration evident with:    PENETRATION-ASPIRATION SCALE (PAS):  '[x]'$  1 = Material does not enter the airway  '[]'$  2 = Material enters the airway, remains above the vocal folds, and is ejected from the airway  '[]'$  3 = Material enters the airway, remains above the vocal folds, and is not ejected from airway  '[]'$  4 = Material enters the airway, contacts the vocal folds, and is ejected from the airway  '[]'$  5 = Material enters the airway, contacts the vocal folds, and is not ejected from the airway  '[]'$  6 = Material enters the airway, passes below the vocal folds, and  is ejected into the larynx or out of the airway  '[]'$  7 = Material enters the airway, passes below the vocal folds, and is not ejected from the trachea despite effort  '[]'$  8 = Material enters the airway, passes below the vocal folds, and no effort is made to eject    ESOPHAGEAL PHASE:   '[x]'$  No significant findings  '[]'$  See Radiology Report for details  '[]'$  Recommend further esophageal testing    ATTEMPTED TECHNIQUES:                                 Comments:  '[]'$ Small bolus size '[]'$  Effective '[]'$ Not Effective    '[]'$ Straw '[]'$  Effective '[]'$ Not Effective    '[]'$ Cup '[]'$  Effective '[]'$ Not Effective    '[]'$ Chin tuck '[]'$  Effective '[]'$ Not Effective    '[]'$ Head Turn '[]'$  Effective '[]'$ Not Effective    '[]'$ Spoon presentations '[]'$  Effective '[]'$ Not Effective    '[]'$  Volitional cough '[]'$   Effective '[]'$ Not Effective    '[]'$ Spontaneous cough '[]'$  Effective '[]'$ Not Effective    '[]'$ OTHER: '[]'$  Effective '[]'$ Not Effective      DIAGNOSTIC IMPRESSIONS:  Patient presents with oral preparatory phase, oral phase, and pharyngeal phase of swallow WFL. No penetration or aspiration. With consumption of thin liquids via straw, minimal residue in valleculae and trace residue on posterior pharyngeal wall that cleared spontaneously with subsequent trials. However, no pharyngeal residue with thin liquids via cup. Patient at minimal risk of aspiration and pulmonary compromise.            DIET RECOMMENDATIONS:    Diet:  '[]'$  NPO '[x]'$  Regular diet   '[]'$  Soft and bite sized (Dysphagia III)  '[]'$  Minced and moist (Dysphagia II)   '[]'$  Pureed (Dysphagia I)  '[]'$  Liquidised       Liquids: '[x]'$  Thin '[]'$  Mildly Thick (Nectar thick)  '[]'$  Moderately Thick (Honey thick) '[]'$  Extremely Thick (Spoon-Pudding)        STRATEGIES:   '[x]'$  Full upright position '[]'$  Small bite/sip   '[x]'$  No Straw   '[]'$  Multiple Swallow  '[]'$  Chin tuck   '[]'$  Head turn   '[]'$  Pulmonary monitoring '[]'$  Oral care after all meals  '[]'$  Supervision  '[]'$  Medication in applesauce   '[]'$  Direct 1:1 Supervision '[]'$  Spoon all liquids   '[]'$  Alternate solid / liquid '[x]'$   Limit distractions   '[]'$  Monitor for fatigue '[]'$  PMV in place for all po   '[]'$  OTHER:    PATIENT STRENGTHS:  '[x]'$  Motivated '[x]'$  Cooperative   '[]'$  Good family support    '[]'$  Prior level of function  '[]'$  OTHER:    REHAB POTENTIAL:   '[x]'$  Excellent '[]'$  Good '[]'$  Fair  '[]'$  Poor    EDUCATION:   Learner: '[x]'$  Patient            '[]'$  Significant other                                   '[]'$  Son/Daughter '[]'$  Parent '[]'$  Other:     Education: '[x]'$  Reviewed results and recommendations of this evaluation     '[]'$  Reviewed diet and strategies     '[]'$  Reviewed signs, symptoms and risk of aspiration     '[]'$  Demonstrated how to thick liquid appropriately.     '[]'$  Reviewed goals and Plan of Care     '[]'$  OTHER:     Method: '[x]'$  Discussion  '[]'$  Demonstration '[]'$  Hand-out     '[]'$  Other:     Evaluation of Education:     '[x]'$  Verbalizes understanding      '[]'$  Demonstrates with assistance     '[]'$  Demonstrates without assistance                                  '[]'$ Needs further instruction      '[]'$  No evidence of learning                                    '[]'$  Family not present    PATIENT GOALS: '[x]'$  Pt did not state; will further assess during treatment.     '[]'$  Return to the least restricted diet possible     '[]'$  Return to previous level of function     '[]'$  Other:  PLAN / TREATMENT RECOMMENDATIONS:  '[x]'$  No further speech therapy services indicated.  '[]'$  Speech Therapy evaluation to assess speech, language, cognition and/or voice  '[]'$  Skilled dysphagia treatment ___ times per week for ___ weeks.  '[]'$  OTHER:        Electronically signed by: Doy Hutching, MS, CCC-SLP          Date: 04/29/2021

## 2021-05-04 ENCOUNTER — Encounter

## 2021-05-04 NOTE — Telephone Encounter (Signed)
[facility] fax refill request

## 2021-05-05 NOTE — Telephone Encounter (Signed)
Spoke to patient. States she does not want this to go to [facility]. States she is using [facility] nurse [facility].

## 2021-05-06 NOTE — Telephone Encounter (Signed)
Katie Juarez with [facility] PT calling stating the last 3x they've seen pt she's been very fatigued, has no recollection of her meds, has pill bottles with her and is unsure what they are for, Katie Juarez is thinking maybe pt needs a sooner appt for her memory, any questions call Katie Juarez at [phone]

## 2021-05-07 NOTE — Telephone Encounter (Signed)
Spoke with Clarise Cruz in PT, advised pt was seen 4/4 with dtr and Dr. Renard Hamper is aware. Next OV is in June, Clarise Cruz states pt was way off on when next appt was.

## 2021-05-26 LAB — URINALYSIS WITH MICROSCOPIC
Bilirubin Urine: NEGATIVE
Casts UA: ABSENT /LPF
Glucose, Ur: NEGATIVE mg/dL
Ketones, Urine: NEGATIVE mg/dL
Nitrite, Urine: POSITIVE — AB
Specific Gravity, Urine: 1.02 mg/dL (ref 1.005–1.030)
Urobilinogen, Urine: 1 mg/dL (ref 0.2–1.0)
pH, UA: 7.5 (ref 5.0–8.5)

## 2021-06-02 ENCOUNTER — Ambulatory Visit: Admit: 2021-06-02 | Discharge: 2021-06-02 | Payer: MEDICARE | Attending: Registered Nurse | Primary: Family Medicine

## 2021-06-02 DIAGNOSIS — R079 Chest pain, unspecified: Secondary | ICD-10-CM

## 2021-06-02 MED ORDER — NITROGLYCERIN 0.4 MG SL SUBL
0.4 | ORAL_TABLET | SUBLINGUAL | 3 refills | Status: DC | PRN
Start: 2021-06-02 — End: 2023-11-21

## 2021-06-02 NOTE — Progress Notes (Signed)
[place]  [address]  [place]  [place] [address]  Dept: [phone]    Subjective:  The patient is a 81 y.o. year old, Caucasian, female is in the office for a follow up after heart cath on 12/21/2020.   Pt seen and examined in the room.  She states that she has occ. CP but not severe in nature.  She states that she is in therapy for back pain.      Past Medical History:   has a past medical history of Basal cell carcinoma (BCC) of face, Breast cancer ([place]), Constipation, COPD (chronic obstructive pulmonary disease) ([place]), DDD (degenerative disc disease), Large bowel obstruction ([place]), LBP (low back pain), Pharyngoesophageal dysphagia, and SS (spinal stenosis).    Past Surgical History:   has a past surgical history that includes Hysterectomy (1980); bladder suspension (1990); Cholecystectomy (1990); Breast lumpectomy (Right, 1999); other surgical history (11/14/13, 06/23/11, 06/30/11, 07/12/11 12/29/11); other surgical history (12/17/2013); other surgical history (Bilateral, 05/30/2014); other surgical history (Bilateral, 09/16/2014); other surgical history (Bilateral, 09/26/2014); other surgical history (10/10/2014); other surgical history (10/31/2014); Tonsillectomy (1946); Appendectomy (1956); Small intestine surgery (02/20/2015); Femoral hernia repair (Left, 02/20/2015); Excision/Biopsy (Left, 11/01/2018); Lumbar spine surgery (01/03/2019); Spine surgery (N/A, 01/03/2019); Upper gastrointestinal endoscopy (N/A, 04/24/2019); and Cardiac catheterization (12/21/2020).    Home Medications:  Prior to Admission medications    Medication Sig Start Date End Date Taking? Authorizing Provider   citalopram (CELEXA) 20 MG tablet Take 1 tablet by mouth daily 04/13/21   [name], DO   predniSONE (DELTASONE) 20 MG tablet Take 2 tabs by mouth daily x 5 days, then 1 tab daily x 3 days. 04/13/21   Leandrew Koyanagi  Black, DO   atorvastatin (LIPITOR) 20 MG tablet Take 1 tablet by mouth daily 04/13/21   [name], DO   donepezil (ARICEPT) 5 MG tablet Take 1 tablet by mouth nightly 04/13/21   [name], DO   umeclidinium-vilanterol [facility] ELLIPTA) 62.5-25 MCG/ACT inhaler Inhale 1 puff into the lungs daily 04/10/21   [name], DO   LORazepam (ATIVAN) 0.5 MG tablet Take 1 tablet by mouth nightly for 90 days. 03/10/21 06/08/21  [name], DO   omeprazole (PRILOSEC) 20 MG delayed release capsule Take 1 capsule by mouth every morning (before breakfast) 03/03/21   [name], DO   Probiotic Acidophilus [facility]) TABS Take 1 tablet by mouth daily    Historical Provider, MD   polyethylene glycol (GLYCOLAX) 17 GM/SCOOP powder Take 17 g by mouth daily As needed.  Patient not taking: Reported on 04/13/2021    Historical Provider, MD   alendronate (FOSAMAX) 70 MG tablet Take 1 tablet by mouth every 7 days 04/11/20   [name], DO   CRANBERRY PO Take by mouth    Historical Provider, MD   Cholecalciferol (VITAMIN D3) 50 MCG (2000 UT) CAPS Take by mouth    Historical Provider, MD   Handicap Placard MISC by Does not apply route Issue parking placard for person with disability; Applicant meets the qualifying disability criteria. Length of time expected to have disability.  Prescription expires in 5 years from issuing date. 07/30/19   [name], DO   CALCIUM CARBONATE-VITAMIN D PO Take 600 mg by mouth 2 times daily Contains 600 mg Calcium & 800 IU Vitamin D3.    Historical Provider, MD   Multiple Vitamins-Minerals (  THERAPEUTIC MULTIVITAMIN-MINERALS) tablet Take 1 tablet by mouth daily    Historical Provider, MD   albuterol sulfate HFA (PROVENTIL;VENTOLIN;PROAIR) 108 (90 Base) MCG/ACT inhaler Ventolin HFA 90 mcg/actuation aerosol inhaler   Inhale 2 puffs every 6 hours by inhalation route as needed.   ASTHMA    Historical Provider, MD   polyvinyl alcohol-povidone (HYPOTEARS) 1.4-0.6 % ophthalmic solution Place 1-2 drops into both eyes  as needed    Historical Provider, MD       Allergies:  Morphine, Pcn [penicillins], Zithromax [azithromycin], and Adhesive tape    Social History:   reports that she quit smoking about 33 years ago. Her smoking use included cigarettes. She started smoking about 68 years ago. She has a 40.00 pack-year smoking history. She has never used smokeless tobacco. She reports that she does not drink alcohol and does not use drugs.    Review of Systems:  Constitutional: there has been no unanticipated weight loss. There's been No change in energy level, No change in activity level.     Eyes: No visual changes or diplopia. No scleral icterus.  ENT: No Headaches, hearing loss or vertigo. No mouth sores or sore throat.  Cardiovascular: As above.  Respiratory: No SOB, cough or hemoptysis.  Gastrointestinal: No abdominal pain, appetite loss, blood in stools. No change in bowel or bladder habits.  Genitourinary: No dysuria, trouble voiding, or hematuria.  Musculoskeletal:  No gait disturbance, No weakness or joint complaints.  Integumentary: No rash or pruritis.  Psychiatric: No anxiety, or depression.  Hematologic/Lymphatic: No abnormal bruising or bleeding, blood clots or swollen lymph nodes.  Allergic/Immunologic: No nasal congestion or hives.    Physical Exam:  LMP  (LMP Unknown)     Constitutional and General Appearance: alert, cooperative, no distress and appears stated age  86: PERRL, no cervical lymphadenopathy. No masses palpable. Normal oral mucosa  Respiratory:  Normal excursion and expansion without use of accessory muscles  Resp Auscultation: Good respiratory effort. No for increased work of breathing. On auscultation: clear to auscultation bilaterally  Cardiovascular:  The apical impulse is not displaced  Heart tones are crisp and normal. regular S1 and S2.  Jugular venous pulsation Normal  The carotid upstroke is normal in amplitude and contour without delay or bruit  Peripheral pulses are symmetrical and full    Abdomen:   No masses or tenderness  Bowel sounds present  Extremities:   No Cyanosis or Clubbing   Lower extremity edema: No   Skin: Warm and dry    Cardiac Data:    Echo 12/16/20  Summary  Normal left ventricular diameter.  Borderline left ventricular hypertrophy.  Left ventricular systolic function is normal.  Left ventricular ejection fraction 65 %.  Grade II (moderate) left ventricular diastolic dysfunction.  Right atrial dilatation.  Aortic valve sclerosis without stenosis.  Trivial aortic regurgitation.  Normal mitral valve leaflets.  Mitral annular calcification.  Mild mitral regurgitation.  Normal tricuspid valve leaflets.    Cath 12/21/20  Findings:  Left main: NL  LAD: 20% mid area  D1: proximal 70% stenosis 9Small branch)  LCX: NL  RCA: Mid 25% stenosis  The LV gram was performed in the RAO 30 position.   LVEF: 55%. LV Wall Motion: Normal     Conclusions:  Normal / Non obstructive CAD  D1 disease but small branch.  Overall preserved LV function       Labs:     CBC: No results for input(s): WBC, HGB, HCT, PLT in the last  72 hours.  BMP: No results for input(s): NA, K, CO2, BUN, CREATININE, LABGLOM, GLUCOSE in the last 72 hours.  PT/INR: No results for input(s): PROTIME, INR in the last 72 hours.  FASTING LIPID PANEL:  Lab Results   Component Value Date/Time    CHOL 236 04/08/2021 03:20 PM    HDL 47 04/08/2021 03:20 PM    LDLCHOLESTEROL 114 04/08/2021 03:20 PM    TRIG 375 04/08/2021 03:20 PM    CHOLHDLRATIO 5.0 04/08/2021 03:20 PM     LIVER PROFILE:No results for input(s): AST, ALT, LABALBU in the last 72 hours.      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    Pain of both sacroiliac joints    Piriformis syndrome of left side    Lumbar radicular pain    Neural foraminal stenosis of lumbar spine    Chronic low back pain    Spinal stenosis, lumbar     Diverticulitis    Acquired absence of other specified parts of digestive tract    Arthrodesis status    Asymptomatic menopausal state    Extravasation of urine    Gastro-esophageal reflux disease without esophagitis    Hyperlipidemia, unspecified    Closed compression fracture of body of L1 vertebra (HCC)    Lumbar burst fracture, open, initial encounter ([place])    Pharyngoesophageal dysphagia    Anxiety    Tremors of nervous system    Benign essential tremor    Dizziness    Memory problem    Balance problems    Chronic cerebral ischemia    Neck stiffness    Tingling sensation    Left arm weakness    Dysphagia    Brain dysfunction    Carotid stenosis, asymptomatic, bilateral    Knee pain    Abdominal pain    COVID-19    Diarrhea    Enteritis    Fatigue    Osteoarthrosis    Otalgia    Otalgia, bilateral    Pain in left arm    Alzheimer's disease, unspecified    Chronic obstructive pulmonary disease, unspecified       RECOMMENDATIONS:   CAD- History of heart cath details as above noncritical lesion, D1 small artery not amenable for any stents.  Will order SL nitro PRN for angina.    CONTINUE CURRENT MEDICATIONS       HYPERTENSION-on lower side patient is advised to discontinue morning dose of propanolol and continue evening dose.   Continue to watch blood pressure heart rate       HYPERLIPIDEMIA-last lipid check was on October 2021 LDL was 70 total cholesterol 156  I advised to check lipid profile again if it is drastically change patient can start on statins    Advised for any fall precautions    DISCUSSED IN DETAILS ABOUT RISK MODIFICATION    RETURN VISIT IN 6 MONTHS, IF ANY SYMPTOM CHANGE PATIENT ADVISED TO GO TO THE EMERGENCY ROOM.          [name], APRN - CNP,   [facility] Cardiology Consult           [phone]

## 2021-06-08 ENCOUNTER — Encounter

## 2021-06-08 NOTE — Telephone Encounter (Signed)
[facility] called requesting a refill of the below medication which has been pended for you:     Requested Prescriptions     Pending Prescriptions Disp Refills    ANORO ELLIPTA 62.5-25 MCG/ACT inhaler [Pharmacy Med Name: Anoro Ellipta 62.5-25 MCG/INH Inhalation Aerosol Powder Breath Activated] 60 each 0     Sig: Inhale 1 puff by mouth once daily    LORazepam (ATIVAN) 0.5 MG tablet [Pharmacy Med Name: LORazepam 0.5 MG Oral Tablet] 90 tablet 0     Sig: TAKE 1 TABLET BY MOUTH NIGHTLY FOR 90 DAYS       Last Appointment Date: 04/13/2021  Next Appointment Date: 06/14/2021    Allergies   Allergen Reactions    Morphine Other (See Comments)     Night sweats,felt "loopy".    Pcn [Penicillins] Hives    Zithromax [Azithromycin] Hives    Adhesive Tape Rash

## 2021-06-10 ENCOUNTER — Encounter

## 2021-06-10 MED ORDER — OMEPRAZOLE 20 MG PO CPDR
20 MG | ORAL_CAPSULE | Freq: Every day | ORAL | 0 refills | Status: DC
Start: 2021-06-10 — End: 2021-08-16

## 2021-06-10 MED ORDER — LORAZEPAM 0.5 MG PO TABS
0.5 MG | ORAL_TABLET | Freq: Every evening | ORAL | 0 refills | Status: DC
Start: 2021-06-10 — End: 2021-07-12

## 2021-06-10 NOTE — Telephone Encounter (Signed)
Per [facility], last fill 3/2, quantity 90 for 90 days.        KB out of office until next week.     [facility] called requesting a refill of the below medication which has been pended for you:     Requested Prescriptions     Pending Prescriptions Disp Refills    LORazepam (ATIVAN) 0.5 MG tablet 90 tablet 0     Sig: Take 1 tablet by mouth nightly for 90 days.    omeprazole (PRILOSEC) 20 MG delayed release capsule 90 capsule 0     Sig: Take 1 capsule by mouth every morning (before breakfast)     Refused Prescriptions Disp Refills    umeclidinium-vilanterol (ANORO ELLIPTA) 62.5-25 MCG/ACT inhaler 90 each 1     Sig: Inhale 1 puff into the lungs daily       Last Appointment Date: 04/13/2021  Next Appointment Date: 06/14/2021    Allergies   Allergen Reactions    Morphine Other (See Comments)     Night sweats,felt "loopy".    Pcn [Penicillins] Hives    Zithromax [Azithromycin] Hives    Adhesive Tape Rash

## 2021-06-10 NOTE — Telephone Encounter (Signed)
Pt requesting to be filled today

## 2021-06-11 MED ORDER — ANORO ELLIPTA 62.5-25 MCG/ACT IN AEPB
Freq: Every day | RESPIRATORY_TRACT | 1 refills | Status: DC
Start: 2021-06-11 — End: 2021-09-01

## 2021-06-14 ENCOUNTER — Ambulatory Visit: Admit: 2021-06-14 | Discharge: 2021-06-14 | Payer: MEDICARE | Attending: Family Medicine | Primary: Family Medicine

## 2021-06-14 ENCOUNTER — Inpatient Hospital Stay: Payer: MEDICARE | Primary: Family Medicine

## 2021-06-14 DIAGNOSIS — R1030 Lower abdominal pain, unspecified: Secondary | ICD-10-CM

## 2021-06-14 DIAGNOSIS — M2550 Pain in unspecified joint: Secondary | ICD-10-CM

## 2021-06-14 LAB — URINALYSIS WITH REFLEX TO CULTURE
Bilirubin Urine: NEGATIVE
Glucose, Ur: NEGATIVE
Ketones, Urine: NEGATIVE
Leukocyte Esterase, Urine: NEGATIVE
Nitrite, Urine: NEGATIVE
Protein, UA: NEGATIVE
Specific Gravity, UA: 1.02 (ref 1.010–1.025)
Urine Hgb: NEGATIVE
Urobilinogen, Urine: NORMAL
pH, UA: 6 (ref 5.0–6.0)

## 2021-06-14 MED ORDER — IBUPROFEN 600 MG PO TABS
600 MG | ORAL_TABLET | Freq: Two times a day (BID) | ORAL | 0 refills | Status: DC | PRN
Start: 2021-06-14 — End: 2021-08-23

## 2021-06-14 MED ORDER — GUAIFENESIN ER 600 MG PO TB12
600 MG | ORAL_TABLET | Freq: Two times a day (BID) | ORAL | 0 refills | Status: AC
Start: 2021-06-14 — End: 2021-07-14

## 2021-06-14 NOTE — Progress Notes (Unsigned)
[facility]  [address]  [place], [place] [address]  [phone]      Katie Juarez is a 81 y.o. female who presents today for her medical conditions/complaints as noted below.  Katie Juarez is c/o of Joint Pain and Other (Increased mucus/swallowing problem)      HPI:     Pt here today for follow-up of mucus and anxiety.    Pt has widespread joint pains constantly x past few days.  Last night, it was so bad she only slept approx 2 hours.  No joint swelling; one joint is not worse than the others.  No new activity.  Pain is now 7/10, but was 9-10/10 last night.  Has some AM stiffness, but feels better after awhile.    Has taken 1 Aleve each morning and evening, and then Tylenol in the afternoon and overnight.  Today, she tried Ibuprofen 200 mg instead, and feels it helped a little more.  Also tried heat/ice and Icy-Hot, which was not effective.    Had recent urine infection - went to [facility] on 5/17 and was started on  Macrobid x 7 days.  Dysuria and frequency are resolved, but she still has aching/cramping in her lower abdomen and wonders if the joint achiness could be related.    Had steroid taper on 5/20   Felt very wound up on this higher dose ***    Still having the same amount of mucus - no change since last OV    Has a gland on the L side of her neck that feels swollen and "full'; feels like it is easier to swallow on the R side than the L.      Doing well on Anoro 1 puff once daily  Feels this is working well for her breathing, but unsure if she has had more mucus since switching to this inhaler    Physical therapy is done; does feel it helped a little bit, but it was short-lived.  Symptoms would return in 2-3 days.    Cardiology prescribed SL Nitro to use as needed for chest pain when she saw them on 5/24 for intermittent chest pain - has not yet had to use it.          Past Medical History:   Diagnosis Date    Basal cell carcinoma (BCC) of face 10/2018    excised by  Dr Filbert Berthold     Breast cancer [facility]) 1999    in remission    Constipation 12/27/2018    COPD (chronic obstructive pulmonary disease) (HCC)     DDD (degenerative disc disease)     Large bowel obstruction (HCC) 02/2015    [facility]    LBP (low back pain)     Pharyngoesophageal dysphagia     SS (spinal stenosis)       Past Surgical History:   Procedure Laterality Date    APPENDECTOMY  1956    BLADDER SUSPENSION  1990    BREAST LUMPECTOMY Right 1999    CARDIAC CATHETERIZATION  12/21/2020    Dr Simone Curia    CHOLECYSTECTOMY  1990    EXCISION/BIOPSY Left 11/01/2018    Dr Filbert Berthold [facility], for [facility]    FEMORAL HERNIA REPAIR Left 02/20/2015    strangulated with small bowel resection - [facility],  [facility], [id number]    HYSTERECTOMY (CERVIX STATUS UNKNOWN)  1980    Done for endometriosis; done in [place]  LUMBAR SPINE SURGERY  01/03/2019    VERTEBRAL AUMENTATION  L1      OTHER SURGICAL HISTORY  11/14/13, 06/23/11, 06/30/11, 07/12/11 12/29/11    L4/L5 IESI    OTHER SURGICAL HISTORY  12/17/2013    L5/S1 IESI    OTHER SURGICAL HISTORY Bilateral 05/30/2014    SIJ and piriformis    OTHER SURGICAL HISTORY Bilateral 09/16/2014    L3, L4, L5 Diagnostic Medial Branch Block    OTHER SURGICAL HISTORY Bilateral 09/26/2014    L5 TFE    OTHER SURGICAL HISTORY  10/10/2014    bil L5 TFE    OTHER SURGICAL HISTORY  10/31/2014    caudal    SMALL INTESTINE SURGERY  02/20/2015    strangulated left femoral hernia    SPINE SURGERY N/A 01/03/2019    VERTEBRAL AUMENTATION  L1  (C-ARM X2, performed by Radonna Ricker, MD at [facility] OR    TONSILLECTOMY  1946    UPPER GASTROINTESTINAL ENDOSCOPY N/A 04/24/2019    EGD BIOPSY performed by Durene Fruits, MD at [facility]     Family History   Problem Relation Age of Onset    Other Mother         ALS - diagnosed at age 183    Stroke Father         age 53    Cancer Sister         pt thinks it was liver    Cancer Brother         leukemia    Diabetes Maternal Grandfather     Other Brother          drowned    Other Brother         died at age 18; think due to spinal meningitis    Lupus Sister     Alzheimer's Disease Sister     Dementia Sister     Mult Sclerosis Nephew     Mult Sclerosis Daughter      Social History     Tobacco Use    Smoking status: Former     Packs/day: 1.00     Years: 40.00     Pack years: 40.00     Types: Cigarettes     Start date: 01/10/1953     Quit date: 01/11/1988     Years since quitting: 33.4    Smokeless tobacco: Never    Tobacco comments:     quit 25 years ago 1990   Substance Use Topics    Alcohol use: No     Alcohol/week: 0.0 standard drinks      Current Outpatient Medications   Medication Sig Dispense Refill    umeclidinium-vilanterol (ANORO ELLIPTA) 62.5-25 MCG/ACT inhaler Inhale 1 puff into the lungs daily 90 each 1    LORazepam (ATIVAN) 0.5 MG tablet Take 1 tablet by mouth nightly for 30 days. Max Daily Amount: 0.5 mg 30 tablet 0    omeprazole (PRILOSEC) 20 MG delayed release capsule Take 1 capsule by mouth every morning (before breakfast) 90 capsule 0    nitroGLYCERIN (NITROSTAT) 0.4 MG SL tablet Place 1 tablet under the tongue every 5 minutes as needed for Chest pain 25 tablet 3    citalopram (CELEXA) 20 MG tablet Take 1 tablet by mouth daily 90 tablet 3    atorvastatin (LIPITOR) 20 MG tablet Take 1 tablet by mouth daily 90 tablet 1    donepezil (ARICEPT) 5 MG tablet Take 1 tablet by mouth nightly 90 tablet  3    Probiotic Acidophilus (FLORANEX) TABS Take 1 tablet by mouth daily      CRANBERRY PO Take by mouth      Cholecalciferol (VITAMIN D3) 50 MCG (2000 UT) CAPS Take by mouth      Handicap Placard MISC by Does not apply route Issue parking placard for person with disability; Applicant meets the qualifying disability criteria. Length of time expected to have disability.  Prescription expires in 5 years from issuing date. 1 each 0    CALCIUM CARBONATE-VITAMIN D PO Take 600 mg by mouth 2 times daily Contains 600 mg Calcium & 800 IU Vitamin D3.      Multiple Vitamins-Minerals  (THERAPEUTIC MULTIVITAMIN-MINERALS) tablet Take 1 tablet by mouth daily      polyvinyl alcohol-povidone (HYPOTEARS) 1.4-0.6 % ophthalmic solution Place 1-2 drops into both eyes as needed      predniSONE (DELTASONE) 20 MG tablet Take 2 tabs by mouth daily x 5 days, then 1 tab daily x 3 days. (Patient not taking: Reported on 06/14/2021) 13 tablet 0    polyethylene glycol (GLYCOLAX) 17 GM/SCOOP powder Take 17 g by mouth daily As needed. (Patient not taking: Reported on 04/13/2021)      alendronate (FOSAMAX) 70 MG tablet Take 1 tablet by mouth every 7 days (Patient not taking: Reported on 06/02/2021) 12 tablet 3    albuterol sulfate HFA (PROVENTIL;VENTOLIN;PROAIR) 108 (90 Base) MCG/ACT inhaler Ventolin HFA 90 mcg/actuation aerosol inhaler   Inhale 2 puffs every 6 hours by inhalation route as needed.   ASTHMA (Patient not taking: Reported on 06/14/2021)       No current facility-administered medications for this visit.     Allergies   Allergen Reactions    Morphine Other (See Comments)     Night sweats,felt "loopy".    Pcn [Penicillins] Hives    Zithromax [Azithromycin] Hives    Adhesive Tape Rash       Health Maintenance   Topic Date Due    COVID-19 Vaccine (4 - Booster for Moderna series) 01/16/2020    Annual Wellness Visit (AWV)  05/07/2021    Lipids  04/09/2022    Depression Screen  04/14/2022    DTaP/Tdap/Td vaccine (2 - Td or Tdap) 09/28/2027    DEXA (modify frequency per FRAX score)  Completed    Flu vaccine  Completed    Shingles vaccine  Completed    Pneumococcal 65+ years Vaccine  Completed    Hepatitis A vaccine  Aged Out    Hib vaccine  Aged Out    Meningococcal (ACWY) vaccine  Aged Out       Subjective:      Review of Systems   Constitutional:  Negative for fever.   HENT:  Negative for sinus pain and sore throat.    Respiratory:  Negative for wheezing.    Gastrointestinal:  Positive for abdominal pain (lower, bilateral). Negative for nausea.   Musculoskeletal:  Positive for arthralgias.   Psychiatric/Behavioral:   Positive for sleep disturbance (due to joint pain).      Objective:     Vitals:    06/14/21 1128   BP: 118/88   Site: Right Upper Arm   Position: Sitting   Pulse: 60   Resp: 17   Temp: 97.6 F (36.4 C)   TempSrc: Temporal   SpO2: 96%   Weight: 140 lb 3.2 oz (63.6 kg)   Height: 5\' 4"  (1.626 m)     Physical Exam    Assessment:      {  There are no diagnoses linked to this encounter. (Refresh or delete this SmartLink)}       Plan:      No follow-ups on file.    No orders of the defined types were placed in this encounter.    No orders of the defined types were placed in this encounter.      Patient given educational materials - see patient instructions.  Discussed use, benefit, and side effects of prescribed medications.  All patient questions answered.  Pt voiced understanding.  Reviewed health maintenance.            Electronically signed by Belinda Fisher, DO, DO on 06/14/2021 at 11:42 AM

## 2021-06-15 ENCOUNTER — Telehealth: Admit: 2021-06-15 | Discharge: 2021-06-15 | Payer: MEDICARE | Attending: Family Medicine | Primary: Family Medicine

## 2021-06-15 DIAGNOSIS — Z0000 Encounter for general adult medical examination without abnormal findings: Secondary | ICD-10-CM

## 2021-06-15 NOTE — Progress Notes (Signed)
Medicare Annual Wellness Visit    HANNAHMAE SCHMOLDT is here for Medicare AWV    Assessment & Plan   Medicare annual wellness visit, subsequent    Recommendations for Preventive Services Due: see orders and patient instructions/AVS.  Recommended screening schedule for the next 5-10 years is provided to the patient in written form: see Patient Instructions/AVS.     No follow-ups on file.     Subjective     Patient's complete Health Risk Assessment and screening values have been reviewed and are found in Flowsheets. The following problems were reviewed today and where indicated follow up appointments were made and/or referrals ordered.    Positive Risk Factor Screenings with Interventions:               General HRA Questions:  Select all that apply: (!) New or Increased Pain    Pain Interventions:  Pt was seen in office on 6/5 and discussed joint pain with KB, plan in place                         Objective      Patient-Reported Vitals  No data recorded       Allergies   Allergen Reactions    Morphine Other (See Comments)     Night sweats,felt "loopy".    Pcn [Penicillins] Hives    Zithromax [Azithromycin] Hives    Adhesive Tape Rash     Prior to Visit Medications    Medication Sig Taking? Authorizing Provider   predniSONE (DELTASONE) 20 MG tablet Take 1 tablet by mouth 2 times daily for 5 days  Phillips Odor, APRN - CNP   guaiFENesin (MUCINEX) 600 MG extended release tablet Take 1 tablet by mouth in the morning and 1 tablet in the evening.  Freddy Jaksch Naomi Castrogiovanni, DO   ibuprofen (ADVIL;MOTRIN) 600 MG tablet Take 1 tablet by mouth 2 times daily as needed for Pain  Freddy Jaksch Rhylie Stehr, DO   umeclidinium-vilanterol (ANORO ELLIPTA) 62.5-25 MCG/ACT inhaler Inhale 1 puff into the lungs daily  Freddy Jaksch Izick Gasbarro, DO   LORazepam (ATIVAN) 0.5 MG tablet Take 1 tablet by mouth nightly for 30 days. Max Daily Amount: 0.5 mg  Purcell Mouton, MD   omeprazole (PRILOSEC) 20 MG delayed release capsule Take 1 capsule by mouth every morning (before  breakfast)  Purcell Mouton, MD   nitroGLYCERIN (NITROSTAT) 0.4 MG SL tablet Place 1 tablet under the tongue every 5 minutes as needed for Chest pain  Nolene Ebbs, APRN - CNP   citalopram (CELEXA) 20 MG tablet Take 1 tablet by mouth daily  Freddy Jaksch Loralyn Rachel, DO   atorvastatin (LIPITOR) 20 MG tablet Take 1 tablet by mouth daily  Freddy Jaksch Vonda Harth, DO   donepezil (ARICEPT) 5 MG tablet Take 1 tablet by mouth nightly  Freddy Jaksch Jordain Radin, DO   Probiotic Acidophilus (FLORANEX) TABS Take 1 tablet by mouth daily  Historical Provider, MD   polyethylene glycol (GLYCOLAX) 17 GM/SCOOP powder Take 17 g by mouth daily As needed.  Patient not taking: Reported on 04/13/2021  Historical Provider, MD   alendronate (FOSAMAX) 70 MG tablet Take 1 tablet by mouth every 7 days  Patient not taking: Reported on 06/02/2021  Freddy Jaksch Keesha Pellum, DO   CRANBERRY PO Take by mouth  Historical Provider, MD   Cholecalciferol (VITAMIN D3) 50 MCG (2000 UT) CAPS Take by mouth  Historical Provider, MD   Handicap Placard MISC by Does not apply  route Issue parking placard for person with disability; Applicant meets the qualifying disability criteria. Length of time expected to have disability.  Prescription expires in 5 years from issuing date.  Freddy Jaksch Lelon Ikard, DO   CALCIUM CARBONATE-VITAMIN D PO Take 600 mg by mouth 2 times daily Contains 600 mg Calcium & 800 IU Vitamin D3.  Historical Provider, MD   Multiple Vitamins-Minerals (THERAPEUTIC MULTIVITAMIN-MINERALS) tablet Take 1 tablet by mouth daily  Historical Provider, MD   albuterol sulfate HFA (PROVENTIL;VENTOLIN;PROAIR) 108 (90 Base) MCG/ACT inhaler Ventolin HFA 90 mcg/actuation aerosol inhaler   Inhale 2 puffs every 6 hours by inhalation route as needed.   ASTHMA  Patient not taking: Reported on 06/14/2021  Historical Provider, MD   polyvinyl alcohol-povidone (HYPOTEARS) 1.4-0.6 % ophthalmic solution Place 1-2 drops into both eyes as needed  Historical Provider, MD       CareTeam (Including outside  providers/suppliers regularly involved in providing care):   Patient Care Team:  Belinda Fisher, DO as PCP - General (Family Medicine)  Belinda Fisher, DO as PCP - Empaneled Provider  Radonna Ricker, MD as Consulting Physician (Orthopedic Surgery)     Reviewed and updated this visit:  Tobacco  Allergies  Meds  Med Hx  Surg Hx  Soc Hx  Fam Hx           GENESI BUFFIN, was evaluated through a synchronous (real-time) telephone encounter. The patient (or guardian if applicable) is aware that this is a billable service, which includes applicable co-pays. This Virtual Visit was conducted with patient's (and/or legal guardian's) consent. Patient identification was verified, and a caregiver was present when appropriate.   The patient was located at Home: [address]  [place] [address]  Provider was located at Facility (Appt Dept): [address]  [place],  [place] [address]      This encounter was performed under my, Belinda Fisher, DO's, direct supervision, 06/15/2021.

## 2021-06-15 NOTE — Patient Instructions (Signed)
Personalized Preventive Plan for Katie Juarez - 06/15/2021  Medicare offers a range of preventive health benefits. Some of the tests and screenings are paid in full while other may be subject to a deductible, co-insurance, and/or copay.    Some of these benefits include a comprehensive review of your medical history including lifestyle, illnesses that may run in your family, and various assessments and screenings as appropriate.    After reviewing your medical record and screening and assessments performed today your provider may have ordered immunizations, labs, imaging, and/or referrals for you.  A list of these orders (if applicable) as well as your Preventive Care list are included within your After Visit Summary for your review.    Other Preventive Recommendations:    A preventive eye exam performed by an eye specialist is recommended every 1-2 years to screen for glaucoma; cataracts, macular degeneration, and other eye disorders.  A preventive dental visit is recommended every 6 months.  Try to get at least 150 minutes of exercise per week or 10,000 steps per day on a pedometer .  Order or download the FREE "Exercise & Physical Activity: Your Everyday Guide" from [employer]. Call (902) [phone] or search [employer] online.  You need 1200-1500 mg of calcium and 1000-2000 IU of vitamin D per day. It is possible to meet your calcium requirement with diet alone, but a vitamin D supplement is usually necessary to meet this goal.  When exposed to the sun, use a sunscreen that protects against both UVA and UVB radiation with an SPF of 30 or greater. Reapply every 2 to 3 hours or after sweating, drying off with a towel, or swimming.  Always wear a seat belt when traveling in a car. Always wear a helmet when riding a bicycle or motorcycle.

## 2021-06-16 LAB — CULTURE, URINE: Culture: NO GROWTH

## 2021-06-17 NOTE — Telephone Encounter (Signed)
Pt calling stating she's having low back, hip and leg aches and pt states she was recommended to use Ibuprofen and pt states that's just working and questions what the next step is, pt uses pended pharmacy, pt aware KB out of office today, please call with recommendations.

## 2021-06-18 ENCOUNTER — Inpatient Hospital Stay: Admit: 2021-06-18 | Payer: MEDICARE | Primary: Family Medicine

## 2021-06-18 ENCOUNTER — Ambulatory Visit: Admit: 2021-06-18 | Discharge: 2021-06-18 | Payer: MEDICARE | Primary: Family Medicine

## 2021-06-18 DIAGNOSIS — M79604 Pain in right leg: Secondary | ICD-10-CM

## 2021-06-18 DIAGNOSIS — M545 Low back pain, unspecified: Secondary | ICD-10-CM

## 2021-06-18 DIAGNOSIS — M1611 Unilateral primary osteoarthritis, right hip: Secondary | ICD-10-CM

## 2021-06-18 MED ORDER — PREDNISONE 20 MG PO TABS
20 MG | ORAL_TABLET | Freq: Two times a day (BID) | ORAL | 0 refills | Status: AC
Start: 2021-06-18 — End: 2021-06-23

## 2021-06-18 MED ORDER — KETOROLAC TROMETHAMINE 30 MG/ML IJ SOLN
30 MG/ML | Freq: Once | INTRAMUSCULAR | Status: AC
Start: 2021-06-18 — End: 2021-06-18
  Administered 2021-06-18: 18:00:00 15 mg via INTRAMUSCULAR

## 2021-06-18 NOTE — Telephone Encounter (Signed)
Patient presents to Walk In inquiring about cholesterol lab results. She stated that these were not reviewed at her last appointment. She is wondering if her cholesterol medicine needs altered.  Please advise.     PH: [phone]  Pharmacy: [facility] in [place]

## 2021-06-18 NOTE — Progress Notes (Signed)
Patient and family updated regarding imaging wait time following toradol injection. Patient tolerated well. Water provided. Warm blanket provided.

## 2021-06-18 NOTE — Progress Notes (Signed)
[place] [facility] department of [facility]  [address]  [place] [address]  Phone: [phone]  Fax: [phone]      Katie Juarez is a 81 y.o. female who presents to the [facility] today for her medical conditions/complaints as noted below. Katie Juarez is c/o of Back Pain (Lower back pain, states this is an ongoing issue, pain radiates down right leg, had a fall on Tuesday, landed on her buttocks, weak on right side started after the fall, would like a back xray to see if something else is going on, hx degenerative issues in back)          HPI:     Back Pain  This is a chronic (Acute on chronic) problem. The current episode started in the past 7 days. The problem occurs intermittently. The problem has been gradually worsening since onset. The pain is present in the lumbar spine and gluteal. The quality of the pain is described as aching. The pain does not radiate. Pain scale: 10/10 during acute episodes. The pain is severe. Pertinent negatives include no abdominal pain, fever, headaches, numbness, tingling or weakness.     Past Medical History:   Diagnosis Date    Basal cell carcinoma (BCC) of face 10/2018    excised by Dr Yehuda Savannah     Breast cancer [facility]) 1999    in remission    Constipation 12/27/2018    COPD (chronic obstructive pulmonary disease) (HCC)     DDD (degenerative disc disease)     Large bowel obstruction ([place]) 02/2015    [facility]    LBP (low back pain)     Pharyngoesophageal dysphagia     SS (spinal stenosis)         Allergies   Allergen Reactions    Morphine Other (See Comments)     Night sweats,felt "loopy".    Pcn [Penicillins] Hives    Zithromax [Azithromycin] Hives    Adhesive Tape Rash       Wt Readings from Last 3 Encounters:   06/18/21 140 lb (63.5 kg)   06/14/21 140 lb 3.2 oz (63.6 kg)   06/02/21 138 lb (62.6 kg)     BP Readings from Last 3 Encounters:   06/18/21 126/78   06/14/21 118/88   06/02/21 110/62      Temp Readings  from Last 3 Encounters:   06/18/21 98.3 F (36.8 C) (Tympanic)   06/14/21 97.6 F (36.4 C) (Temporal)   04/13/21 97.8 F (36.6 C) (Temporal)     Pulse Readings from Last 3 Encounters:   06/18/21 62   06/14/21 60   06/02/21 63     SpO2 Readings from Last 3 Encounters:   06/18/21 95%   06/14/21 96%   04/13/21 92%       Subjective:      Review of Systems   Constitutional:  Negative for fever.   Gastrointestinal:  Negative for abdominal pain.   Musculoskeletal:  Positive for back pain.   Neurological:  Negative for tingling, weakness, numbness and headaches.     Objective:     Vitals:    06/18/21 1109   BP: 126/78   Site: Left Upper Arm   Position: Sitting   Cuff Size: Medium Adult   Pulse: 62   Resp: 18   Temp: 98.3 F (36.8 C)   TempSrc: Tympanic   SpO2: 95%   Weight: 140 lb (63.5 kg)   Height:  $'5\' 4"'B$  (1.626 m)     Body mass index is 24.03 kg/m.    BP 126/78 (Site: Left Upper Arm, Position: Sitting, Cuff Size: Medium Adult)   Pulse 62   Temp 98.3 F (36.8 C) (Tympanic)   Resp 18   Ht '5\' 4"'$  (1.626 m)   Wt 140 lb (63.5 kg)   LMP  (LMP Unknown)   SpO2 95%   BMI 24.03 kg/m   Physical Exam  Vitals reviewed.   Constitutional:       General: She is not in acute distress.  HENT:      Head: Normocephalic.   Eyes:      Extraocular Movements: Extraocular movements intact.      Pupils: Pupils are equal, round, and reactive to light.   Cardiovascular:      Rate and Rhythm: Normal rate and regular rhythm.      Heart sounds: No murmur heard.  Pulmonary:      Effort: Pulmonary effort is normal.      Breath sounds: Normal breath sounds.   Musculoskeletal:         General: No swelling.      Cervical back: Normal and neck supple.      Thoracic back: Normal.      Lumbar back: Tenderness present. Negative right straight leg raise test and negative left straight leg raise test.        Back:    Neurological:      General: No focal deficit present.      Mental Status: She is alert and oriented to person, place, and time.       GCS: GCS eye subscore is 4. GCS verbal subscore is 5. GCS motor subscore is 6.      Cranial Nerves: Cranial nerves 2-12 are intact.      Sensory: Sensation is intact.      Motor: Motor function is intact.      Coordination: Coordination is intact.      Gait: Gait is intact.   Psychiatric:         Mood and Affect: Mood normal.         Behavior: Behavior normal.       Assessment and Plan      Diagnosis Orders   1. Osteoarthritis of right hip, unspecified osteoarthritis type        2. Low back pain radiating to right lower extremity  XR LUMBAR SPINE (2-3 VIEWS)    XR HIP RIGHT (2-3 VIEWS)    [place] - [facility], [place], [place], Orthopedic Surgery, [place]    predniSONE (DELTASONE) 20 MG tablet        Orders Placed This Encounter    XR LUMBAR SPINE (2-3 VIEWS)     Standing Status:   Future     Number of Occurrences:   1     Standing Expiration Date:   07/18/2021     Order Specific Question:   Reason for exam:     Answer:   right sided lower lumbar pain, right hip pain, fall x3 days ago    XR HIP RIGHT (2-3 VIEWS)     Standing Status:   Future     Number of Occurrences:   1     Standing Expiration Date:   07/18/2021     Order Specific Question:   Reason for exam:     Answer:   right lumbar and hip pain, fall x 2 days ago, ambulatory    [place],  Marland Kitchen, [place], Orthopedic Surgery, [place]     Referral Priority:   Urgent     Referral Type:   Eval and Treat     Referral Reason:   Specialty Services Required     Referred to Provider:   Oliva Bustard, PA-C     Requested Specialty:   Physician Assistant     Number of Visits Requested:   1    ketorolac (TORADOL) injection 15 mg    predniSONE (DELTASONE) 20 MG tablet     Sig: Take 1 tablet by mouth 2 times daily for 5 days     Dispense:  10 tablet     Refill:  0     XR LUMBAR SPINE (2-3 VIEWS)    Result Date: 06/18/2021  EXAMINATION: 3 XRAY VIEWS OF THE LUMBAR SPINE 06/18/2021 11:33 am COMPARISON: 01/01/2019 HISTORY: ORDERING SYSTEM PROVIDED HISTORY: Low back pain radiating to right  lower extremity TECHNOLOGIST PROVIDED HISTORY: right sided lower lumbar pain, right hip pain, fall x3 days ago Reason for Exam: Fall a few days ago; worsening low back pain; rt hip pain radiating down leg FINDINGS: Postoperative changes of posterior fusion at L4-5 with laminectomies.  No hardware complication.  Changes of vertebroplasty at T12 and L1 with chronic wedge deformities.  No definite acute fracture with osteopenia limiting evaluation.  Sagittal alignment is maintained.  Vascular calcifications. Mild-to-moderate multilevel disc height loss and osteophytosis.     No acute osseous abnormality. Mild-to-moderate multilevel degenerative disc disease with changes of posterior fusion at L4-5.     XR HIP RIGHT (2-3 VIEWS)    Result Date: 06/18/2021  EXAMINATION: TWO XRAY VIEWS OF THE RIGHT HIP with single view of the pelvis 06/18/2021 11:33 am COMPARISON: None. HISTORY: ORDERING SYSTEM PROVIDED HISTORY: Low back pain radiating to right lower extremity TECHNOLOGIST PROVIDED HISTORY: right lumbar and hip pain, fall x 2 days ago, ambulatory Reason for Exam: Fall a few days ago; worsening low back pain; rt hip pain radiating down leg FINDINGS: Mild degenerate changes at the sacroiliac joints.  Mild-to-moderate osteoarthritis of the hips.  No acute fracture or subluxation.  No focal osseous lesions.  Vascular calcifications.     No acute osseous abnormality. Mild-to-moderate osteoarthritis of the right hip.     Discussed xrays with patient and family member in clinic.   Complete exercises/stretches as tolerated  Warm compresses/heating pad for 15-20 minutes, 3-4 times a day leaving an hour in between each application.   Follow up with ortho and PCP  Return for continued or worsening symptoms    Discussed exam, plan of care, and follow-up at length with patient.  Reviewed all prescribed and recommended medications, administration and side effects. Encouraged patient to follow up with PCP or return to the clinic for no  improvement and or worsening of symptoms. All questions were answered and they verbalized understanding and were agreeable with the plan.     Follow up as needed.        Electronically signed by Mohammed Kindle, APRN - CNP on 06/18/2021 at 3:23 PM

## 2021-06-18 NOTE — Telephone Encounter (Signed)
I spoke with Dr. Renard Hamper who stated that the patient had lab work 04/08/21 and was seen 04/13/21 and this was addressed at her visit. Patient was then started on atorvastatin following the visit based off of her lab work and a recheck lab is ordered for future around 10/13/21.     I notified the patient and her daughter of this information. They verbalized their understanding. The patient stated her daughter mentioned that if people are on cholesterol medication too long, it may be bad for them. Patient states she would like to stop the atorvastatin and see what happens and see if she is able to be placed on a different medication. I notified patient and daughter that I would relay this information to Dr. Renard Hamper and her office would be in contact for further guidance.

## 2021-06-18 NOTE — Telephone Encounter (Signed)
Patient seen in [facility] 06/18/21.

## 2021-06-18 NOTE — Patient Instructions (Signed)
Complete exercises/stretches as tolerated  Follow up with ortho and PCP  Return for continued or worsening symptoms

## 2021-06-24 ENCOUNTER — Inpatient Hospital Stay: Payer: MEDICARE | Primary: Family Medicine

## 2021-06-24 ENCOUNTER — Ambulatory Visit: Admit: 2021-06-24 | Payer: MEDICARE | Attending: Family | Primary: Family Medicine

## 2021-06-24 ENCOUNTER — Inpatient Hospital Stay: Admit: 2021-06-24 | Payer: MEDICARE | Primary: Family Medicine

## 2021-06-24 DIAGNOSIS — H539 Unspecified visual disturbance: Secondary | ICD-10-CM

## 2021-06-24 DIAGNOSIS — R5383 Other fatigue: Secondary | ICD-10-CM

## 2021-06-24 LAB — COMPREHENSIVE METABOLIC PANEL
ALT: 10 U/L (ref 5–33)
AST: 12 U/L (ref <32)
Albumin/Globulin Ratio: 1.7 (ref 1.0–2.5)
Albumin: 4.4 g/dL (ref 3.5–5.2)
Alkaline Phosphatase: 63 U/L (ref 35–104)
Anion Gap: 9 mmol/L (ref 9–17)
BUN: 32 mg/dL — ABNORMAL HIGH (ref 8–23)
Bun/Cre Ratio: 40 — ABNORMAL HIGH (ref 9–20)
CO2: 29 mmol/L (ref 20–31)
Calcium: 9.5 mg/dL (ref 8.6–10.4)
Chloride: 105 mmol/L (ref 98–107)
Creatinine: 0.8 mg/dL (ref 0.50–0.90)
Est, Glom Filt Rate: >60 mL/min/{1.73_m2} (ref >60)
Glucose: 101 mg/dL — ABNORMAL HIGH (ref 70–99)
Potassium: 4 mmol/L (ref 3.7–5.3)
Sodium: 143 mmol/L (ref 135–144)
Total Bilirubin: 0.3 mg/dL (ref 0.3–1.2)
Total Protein: 7 g/dL (ref 6.4–8.3)

## 2021-06-24 LAB — CBC WITH AUTO DIFFERENTIAL
Absolute Eos #: 0.16 10*3/uL (ref 0.0–0.4)
Absolute Immature Granulocyte: 0.81 10*3/uL — ABNORMAL HIGH (ref 0.00–0.30)
Absolute Lymph #: 3.86 10*3/uL (ref 1.0–4.8)
Absolute Mono #: 0.81 10*3/uL (ref 0.1–1.2)
Atypical Lymphocytes Absolute: 0.16 10*3/uL
Atypical Lymphocytes: 1 %
Basophils Absolute: 0 10*3/uL (ref 0.0–0.2)
Basophils: 0 % (ref 0–1)
Eosinophils %: 1 % (ref 1–7)
Hematocrit: 42.5 % (ref 36.3–47.1)
Hemoglobin: 13.9 g/dL (ref 11.9–15.1)
Immature Granulocytes: 5 % — ABNORMAL HIGH
Lymphocytes: 24 % (ref 16–46)
MCH: 31.5 pg (ref 25.2–33.5)
MCHC: 32.7 g/dL (ref 25.2–33.5)
MCV: 96.4 fL (ref 82.6–102.9)
MPV: 10.3 fL (ref 8.1–13.5)
Monocytes: 5 % (ref 4–11)
Morphology: ADEQUATE
Morphology: NORMAL
NRBC Automated: 0 per 100 WBC
Platelets: 230 10*3/uL (ref 138–453)
RBC: 4.41 m/uL (ref 3.95–5.11)
RDW: 13.3 % (ref 11.8–14.4)
Seg Neutrophils: 64 % (ref 43–77)
Segs Absolute: 10.3 10*3/uL — ABNORMAL HIGH (ref 1.50–8.10)
WBC: 16.1 10*3/uL — ABNORMAL HIGH (ref 3.5–11.3)

## 2021-06-24 LAB — TSH WITH REFLEX: TSH: 2.58 u[IU]/mL (ref 0.30–5.00)

## 2021-06-24 MED ORDER — TRAMADOL HCL 50 MG PO TABS
50 MG | ORAL_TABLET | Freq: Four times a day (QID) | ORAL | 0 refills | Status: AC | PRN
Start: 2021-06-24 — End: 2021-06-27

## 2021-06-24 NOTE — Progress Notes (Signed)
Subjective:      Patient ID: Katie Juarez is a 81 y.o. female coming in for   Chief Complaint   Patient presents with    Back Pain     Lower back down right leg. Seen in UC 6/9.         HPI    Presents to office for follow up for right sided sciatica pain. Seen in UC on 06/18/21. Xray lumbar and hip done at that time.     Lumbar: Postoperative changes of posterior fusion at L4-5 with laminectomies.  No hardware complication.  Changes of vertebroplasty at T12 and L1 with chronic wedge deformities.  No definite acute fracture with osteopenia limiting evaluation.  Sagittal alignment is maintained.  Vascular calcifications.Mild-to-moderate multilevel disc height loss and osteophytosis    Xray right hip:  Mild degenerate changes at the sacroiliac joints.  Mild-to-moderate  osteoarthritis of the hips.  No acute fracture or subluxation.  No focal osseous lesions.  Vascular calcifications.    Pt given prednisone '20mg'$  bid x 5 days. Has not had any relief from prednisone. Does have some mild relief with motrin.   Pt has had two unprovoked falls over past week. One occurring day of UC visit and once a few days ago. Pt has had some left sided visual changes since second fall a few days ago. Cannot describe her visual changes other than she feels like she constantly has to blink and having blurry vision. Denies any other neurological symptoms.     Chronic fatigue, uncertain exact cause. Did recently have US thyroid, which was benign. No thyroid labs done.     Has ortho appt with Dr. Ernst Spell at [facility] on 06/28/21      Review of Systems   Constitutional:  Positive for fatigue. Negative for unexpected weight change.   Eyes:  Positive for visual disturbance.   Genitourinary: Negative.    Musculoskeletal:  Positive for arthralgias and back pain.   Neurological:  Positive for weakness. Negative for dizziness, light-headedness and numbness.      Objective:BP 126/80 (Site: Right Upper Arm, Position: Sitting, Cuff Size: Medium Adult)    Pulse 58   Temp 97.6 F (36.4 C) (Temporal)   Ht '5\' 4"'$  (1.626 m)   Wt 140 lb (63.5 kg)   LMP  (LMP Unknown)   SpO2 93%   BMI 24.03 kg/m      Physical Exam  Vitals and nursing note reviewed.   Constitutional:       General: She is not in acute distress.     Appearance: Normal appearance. She is not ill-appearing.   HENT:      Head: Normocephalic.   Eyes:      General: No visual field deficit.     Extraocular Movements: Extraocular movements intact.   Neck:      Vascular: No carotid bruit.   Cardiovascular:      Rate and Rhythm: Normal rate and regular rhythm.      Heart sounds: Normal heart sounds, S1 normal and S2 normal.   Pulmonary:      Effort: Pulmonary effort is normal.      Breath sounds: Normal breath sounds.   Musculoskeletal:      Right lower leg: No edema.      Left lower leg: No edema.   Skin:     General: Skin is warm and dry.      Findings: No rash.   Neurological:      General: No  focal deficit present.      Mental Status: She is alert and oriented to person, place, and time.      Cranial Nerves: No cranial nerve deficit or facial asymmetry.      Coordination: Finger-Nose-Finger Test and Heel to [place] Test normal.        Assessment:      1. Visual changes    2. Recurrent falls    3. Sciatica associated with disorder of lumbar spine    4. Fatigue, unspecified type           Plan:    -uncertain cause of visual changes and frequent falls. Plan is to order stat CT head to rule out any acute cause  -tramadol given for mod-severe pain for sciatica. Recommend keep ortho appt  -as for fatigue, checking cbc, cmp, tsh and b 12 levels  -will call pt and daughter with results once back. Pt and daughter agreeable.     Orders Placed This Encounter   Procedures    CT HEAD WO CONTRAST     Standing Status:   Future     Number of Occurrences:   1     Standing Expiration Date:   06/25/2022     Order Specific Question:   Reason for exam:     Answer:   recurrent falls, visual changes to left eye    CBC with Auto  Differential     Standing Status:   Future     Standing Expiration Date:   06/25/2022    Comprehensive Metabolic Panel     Standing Status:   Future     Standing Expiration Date:   06/25/2022    Vitamin B12 & Folate     Standing Status:   Future     Standing Expiration Date:   06/25/2022    TSH With Reflex Ft4     Standing Status:   Future     Standing Expiration Date:   06/25/2022      Outpatient Encounter Medications as of 06/24/2021   Medication Sig Dispense Refill    traMADol (ULTRAM) 50 MG tablet Take 1 tablet by mouth every 6 hours as needed for Pain for up to 3 days. Intended supply: 3 days. Take lowest dose possible to manage pain Max Daily Amount: 200 mg 12 tablet 0    guaiFENesin (MUCINEX) 600 MG extended release tablet Take 1 tablet by mouth in the morning and 1 tablet in the evening. 60 tablet 0    ibuprofen (ADVIL;MOTRIN) 600 MG tablet Take 1 tablet by mouth 2 times daily as needed for Pain 20 tablet 0    umeclidinium-vilanterol (ANORO ELLIPTA) 62.5-25 MCG/ACT inhaler Inhale 1 puff into the lungs daily 90 each 1    LORazepam (ATIVAN) 0.5 MG tablet Take 1 tablet by mouth nightly for 30 days. Max Daily Amount: 0.5 mg 30 tablet 0    omeprazole (PRILOSEC) 20 MG delayed release capsule Take 1 capsule by mouth every morning (before breakfast) 90 capsule 0    nitroGLYCERIN (NITROSTAT) 0.4 MG SL tablet Place 1 tablet under the tongue every 5 minutes as needed for Chest pain 25 tablet 3    citalopram (CELEXA) 20 MG tablet Take 1 tablet by mouth daily 90 tablet 3    donepezil (ARICEPT) 5 MG tablet Take 1 tablet by mouth nightly 90 tablet 3    Probiotic Acidophilus (FLORANEX) TABS Take 1 tablet by mouth daily      CRANBERRY PO Take by mouth  Cholecalciferol (VITAMIN D3) 50 MCG (2000 UT) CAPS Take by mouth      Handicap Placard MISC by Does not apply route Issue parking placard for person with disability; Applicant meets the qualifying disability criteria. Length of time expected to have disability.  Prescription  expires in 5 years from issuing date. 1 each 0    CALCIUM CARBONATE-VITAMIN D PO Take 600 mg by mouth 2 times daily Contains 600 mg Calcium & 800 IU Vitamin D3.      Multiple Vitamins-Minerals (THERAPEUTIC MULTIVITAMIN-MINERALS) tablet Take 1 tablet by mouth daily      polyvinyl alcohol-povidone (HYPOTEARS) 1.4-0.6 % ophthalmic solution Place 1-2 drops into both eyes as needed      atorvastatin (LIPITOR) 20 MG tablet Take 1 tablet by mouth daily (Patient not taking: Reported on 06/24/2021) 90 tablet 1    polyethylene glycol (GLYCOLAX) 17 GM/SCOOP powder Take 17 g by mouth daily As needed. (Patient not taking: Reported on 04/13/2021)      alendronate (FOSAMAX) 70 MG tablet Take 1 tablet by mouth every 7 days (Patient not taking: Reported on 06/02/2021) 12 tablet 3    albuterol sulfate HFA (PROVENTIL;VENTOLIN;PROAIR) 108 (90 Base) MCG/ACT inhaler Ventolin HFA 90 mcg/actuation aerosol inhaler   Inhale 2 puffs every 6 hours by inhalation route as needed.   ASTHMA (Patient not taking: Reported on 06/14/2021)       No facility-administered encounter medications on file as of 06/24/2021.            Albina Billet, APRN - CNP

## 2021-06-25 LAB — VITAMIN B12 & FOLATE
Folate: 17.6 ng/mL (ref >4.8)
Vitamin B-12: 409 pg/mL (ref 232–1245)

## 2021-06-30 ENCOUNTER — Encounter

## 2021-07-09 ENCOUNTER — Ambulatory Visit: Payer: MEDICARE | Attending: Orthopaedic Surgery | Primary: Family Medicine

## 2021-07-12 ENCOUNTER — Encounter

## 2021-07-12 MED ORDER — LORAZEPAM 0.5 MG PO TABS
0.5 MG | ORAL_TABLET | Freq: Every evening | ORAL | 0 refills | Status: AC
Start: 2021-07-12 — End: 2021-10-10

## 2021-07-12 NOTE — Telephone Encounter (Signed)
Pt requesting refill to loaded pharmacy. Would like 90 tablets.

## 2021-07-12 NOTE — Telephone Encounter (Signed)
Per [place], last fill 6/1, quantity 30 for 30 days.   Previous fill was only 30 days as KB was out of office.    [facility] called requesting a refill of the below medication which has been pended for you:     Requested Prescriptions     Pending Prescriptions Disp Refills    LORazepam (ATIVAN) 0.5 MG tablet 90 tablet 0     Sig: Take 1 tablet by mouth nightly for 90 days. Max Daily Amount: 0.5 mg       Last Appointment Date: 06/15/2021  Next Appointment Date: 08/16/2021    Allergies   Allergen Reactions    Morphine Other (See Comments)     Night sweats,felt "loopy".    Pcn [Penicillins] Hives    Zithromax [Azithromycin] Hives    Adhesive Tape Rash

## 2021-07-12 NOTE — Telephone Encounter (Signed)
Controlled Substance Monitoring:    Acute and Chronic Pain Monitoring:   RX Monitoring 07/12/2021   Periodic Controlled Substance Monitoring No signs of potential drug abuse or diversion identified.    Obtained or confirmed "Medication Contract" on file.

## 2021-08-05 LAB — CBC WITH AUTO DIFFERENTIAL
Basophils %: 1.349 (ref 0.0–3.0)
Basophils Absolute: 0.1265 (ref 0.0–0.3)
Eosinophils %: 1.48 (ref 0.0–10.0)
Eosinophils Absolute: 0.1387 (ref 0.0–1.1)
Hematocrit: 42.7 % (ref 37.0–47.0)
Hemoglobin: 13 (ref 12.0–16.0)
Lymphocyte %: 16.19 — ABNORMAL LOW (ref 20–51.1)
Lymphocytes Absolute: 1.518 (ref 1.0–5.5)
MCH: 29.2 pg (ref 28.5–32.5)
MCHC: 30.6 g/dL — ABNORMAL LOW (ref 32.0–37.0)
MCV: 95.7 fl — ABNORMAL HIGH (ref 80.0–94.0)
Monocytes %: 9.155 (ref 1.7–9.3)
Monocytes Absolute: 0.8583 (ref 0.1–1.0)
Neutrophils %: 71.83 (ref 42.2–75.2)
Neutrophils Absolute: 6.733 (ref 2.0–8.1)
Platelets: 172.6 10*3/uL (ref 130–400)
RBC: 4.46 M/uL (ref 4.20–5.40)
RDW: 14 % (ref 8.5–15.5)
WBC: 9.4 Thou/mL3 (ref 4.8–10.8)

## 2021-08-05 LAB — COMPREHENSIVE METABOLIC PANEL
ALT: 22 units/L (ref 4–35)
AST: 30 units/L (ref 14–36)
Albumin/Globulin Ratio: 1.4 g/dL
Albumin: 4.2 g/dL (ref 3.5–5.0)
Alkaline Phosphatase: 49 units/L (ref 38–126)
Anion Gap: 6.7 mmol/L (ref 4–12)
BUN: 22 mg/dL — ABNORMAL HIGH (ref 7–17)
CO2: 29 mmol/L (ref 22–31)
Calcium: 8.8 mg/dL (ref 8.4–10.2)
Chloride: 105 mmol/L (ref 98–120)
Creatinine Clearance: 39.0292
Creatinine: 0.8 mg/dL (ref 0.5–1.0)
Gfr Calculated: >60
Globulin: 2.9 g/dL
Glucose: 106 mg/dL — ABNORMAL HIGH (ref 65–105)
Potassium: 4.1 mmol/L (ref 3.6–5.0)
Sodium: 141 mmol/L (ref 135–145)
Total Bilirubin: 0.5 mg/dL (ref 0.2–1.3)
Total Protein, Serum: 7.1 g/dL (ref 6.3–8.2)

## 2021-08-05 LAB — LACTIC ACID: Lactic Acid: 1.4 mmol/L (ref 0.7–1.9)

## 2021-08-05 LAB — LIPASE: Lipase: 105 units/L (ref 23–300)

## 2021-08-05 LAB — URINALYSIS WITH MICROSCOPIC
Bilirubin Urine: NEGATIVE
Blood, Urine: NEGATIVE
Casts UA: ABSENT /LPF
Crystals, UA: ABSENT
Glucose, Ur: NEGATIVE mg/dL
Ketones, Urine: NEGATIVE mg/dL
Leukocyte Esterase, Urine: NEGATIVE
Nitrite, Urine: NEGATIVE
Specific Gravity, Urine: 1.01 mg/dL (ref 1.005–1.030)
Urobilinogen, Urine: 0.2 mg/dL (ref 0.2–1.0)
pH, UA: 6.5 (ref 5.0–8.5)

## 2021-08-07 LAB — COMPREHENSIVE METABOLIC PANEL
ALT: 20 units/L (ref 4–35)
AST: 26 units/L (ref 14–36)
Albumin/Globulin Ratio: 1.4 g/dL
Albumin: 4.1 g/dL (ref 3.5–5.0)
Alkaline Phosphatase: 65 units/L (ref 38–126)
Anion Gap: 10 mmol/L (ref 4–12)
BUN: 19 mg/dL — ABNORMAL HIGH (ref 7–17)
CO2: 27 mmol/L (ref 22–31)
Calcium: 9 mg/dL (ref 8.4–10.2)
Chloride: 102 mmol/L (ref 98–120)
Creatinine Clearance: 37.4543
Creatinine: 0.8 mg/dL (ref 0.5–1.0)
Gfr Calculated: >60
Globulin: 2.8 g/dL
Glucose: 88 mg/dL (ref 65–105)
Potassium: 4.2 mmol/L (ref 3.6–5.0)
Sodium: 139 mmol/L (ref 135–145)
Total Bilirubin: 0.4 mg/dL (ref 0.2–1.3)
Total Protein, Serum: 6.9 g/dL (ref 6.3–8.2)

## 2021-08-07 LAB — CBC WITH AUTO DIFFERENTIAL
Basophils %: 1.035 (ref 0.0–3.0)
Basophils Absolute: 0.0983 (ref 0.0–0.3)
Eosinophils %: 1.5 (ref 0.0–10.0)
Eosinophils Absolute: 0.1425 (ref 0.0–1.1)
Hematocrit: 40.7 % (ref 37.0–47.0)
Hemoglobin: 12.6 (ref 12.0–16.0)
Lymphocyte %: 17.95 — ABNORMAL LOW (ref 20–51.1)
Lymphocytes Absolute: 1.706 (ref 1.0–5.5)
MCH: 29.4 pg (ref 28.5–32.5)
MCHC: 31 g/dL — ABNORMAL LOW (ref 32.0–37.0)
MCV: 94.9 fl — ABNORMAL HIGH (ref 80.0–94.0)
Monocytes %: 10.05 — ABNORMAL HIGH (ref 1.7–9.3)
Monocytes Absolute: 0.9548 (ref 0.1–1.0)
Neutrophils %: 69.46 (ref 42.2–75.2)
Neutrophils Absolute: 6.599 (ref 2.0–8.1)
Platelets: 172.1 10*3/uL (ref 130–400)
RBC: 4.29 M/uL (ref 4.20–5.40)
RDW: 13.8 % (ref 8.5–15.5)
WBC: 9.5 Thou/mL3 (ref 4.8–10.8)

## 2021-08-07 LAB — URINALYSIS WITH MICROSCOPIC
Bilirubin Urine: NEGATIVE
Casts UA: ABSENT /LPF
Crystals, UA: ABSENT
Glucose, Ur: NEGATIVE mg/dL
Ketones, Urine: NEGATIVE mg/dL
Leukocyte Esterase, Urine: NEGATIVE
Nitrite, Urine: NEGATIVE
Protein, UA: NEGATIVE mg/dL
Specific Gravity, Urine: 1.025 mg/dL (ref 1.005–1.030)
Urobilinogen, Urine: 0.2 mg/dL (ref 0.2–1.0)
pH, UA: 6 (ref 5.0–8.5)

## 2021-08-07 LAB — LACTIC ACID: Lactic Acid: 1.1 mmol/L (ref 0.7–1.9)

## 2021-08-09 ENCOUNTER — Encounter

## 2021-08-09 NOTE — Telephone Encounter (Signed)
-----   Message from Benito Mccreedy sent at 08/09/2021  9:19 AM EDT -----  Subject: Appointment Request    Reason for Call: Established Patient Appointment needed: Routine ED Follow   Up Visit    QUESTIONS    Reason for appointment request? No appointments available during search     Additional Information for Provider? Katie Juarez was seen at the ER twice at   [facility] for back and abdominal pain and would like to schedule an ED   Follow up appointment this week except today. She can be reached at   [phone]   ---------------------------------------------------------------------------  --------------  [place]  [phone]; Do not leave any message, patient will call back for answer  ---------------------------------------------------------------------------  --------------  SCRIPT ANSWERS

## 2021-08-09 NOTE — Telephone Encounter (Signed)
Spoke with dtr, advised pt is seeing [place] on 8/7. States she was seen twice in the ER over the weekend and would like to be seen sometime this week. No current openings. States she would like to keep the appt on the 7th and will call back, had to get off the phone.

## 2021-08-10 MED ORDER — TRAMADOL HCL 50 MG PO TABS
50 MG | ORAL_TABLET | Freq: Four times a day (QID) | ORAL | 0 refills | Status: AC | PRN
Start: 2021-08-10 — End: 2021-08-15

## 2021-08-10 NOTE — Telephone Encounter (Signed)
Writer called patient's daughter, Paschal Dopp, to set up referral appt. Daughter states the patient was set up with [facility] and does not wish to make an appointment with [place] at this time.

## 2021-08-10 NOTE — Telephone Encounter (Signed)
From: Roselyn Reef Silvernail  To: Dr. [name]  Sent: 08/09/2021 8:10 PM EDT  Subject: Refill RX    Mom was in the ER on both Thursday and Saturday for back and belly pain. We have enough tramadol to get to Thursday afternoon but we can't get into see you till Monday the 7th. Will you please send a script to [place] to get [place] by till we see you then?   Thank you, [name] (daughter)

## 2021-08-10 NOTE — Telephone Encounter (Signed)
See MyChart message. ER note in media.    Per [place], last fill 7/27, quantity 20 for 5 days.     [facility] called requesting a refill of the below medication which has been pended for you:     Requested Prescriptions     Pending Prescriptions Disp Refills    traMADol (ULTRAM) 50 MG tablet       Sig: Take 1 tablet by mouth every 6 hours as needed. Max Daily Amount: 200 mg       Last Appointment Date: 06/15/2021  Next Appointment Date: 08/16/2021    Allergies   Allergen Reactions    Morphine Other (See Comments)     Night sweats,felt "loopy".    Pcn [Penicillins] Hives    Zithromax [Azithromycin] Hives    Adhesive Tape Rash

## 2021-08-10 NOTE — Telephone Encounter (Signed)
Controlled Substance Monitoring:    Acute and Chronic Pain Monitoring:   RX Monitoring 08/10/2021   Periodic Controlled Substance Monitoring No signs of potential drug abuse or diversion identified.    -

## 2021-08-16 ENCOUNTER — Ambulatory Visit: Admit: 2021-08-16 | Discharge: 2021-08-16 | Payer: MEDICARE | Attending: Family Medicine | Primary: Family Medicine

## 2021-08-16 DIAGNOSIS — M545 Low back pain, unspecified: Secondary | ICD-10-CM

## 2021-08-16 MED ORDER — TRAMADOL HCL 50 MG PO TABS
50 MG | ORAL_TABLET | Freq: Three times a day (TID) | ORAL | 0 refills | Status: DC | PRN
Start: 2021-08-16 — End: 2021-08-23

## 2021-08-16 NOTE — Progress Notes (Unsigned)
[facility]  [address]  [place], [place] [address]  [phone]      Katie Juarez is a 81 y.o. female who presents today for her medical conditions/complaints as noted below.  Katie Juarez is c/o of Other (F/u mucus), Anxiety, and Fall (July 29 weekend)      HPI:     Pt here today for follow-up of increased pain and multiple falls.    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    Still having R-sided back pain down into her R leg    Seeing Pain Mgmt at [facility] on 8/24    MRI lumbar spine, as ordered today by Ortho, will be done tomorrow    One fall was on grass and it was unsteady  Second fall was on cement when she was looking up  Both falls within 1 week approx 6 weeks ago; no falls since then  Using walker inside the house    Feels unsteady; does not feel "completely in control"    Had abdominal pain and back pain - went to [facility] ER on 7/29  Was shown to have abdominal ileus per pt's daughter on CT scan  Was started on Bisacodyl and Flagyl to take BID x 7 days    Moving her bowels every other day - BID - using stool softeners and Miralax, which do help    Has swallow study at [facility] on 8/11 - ordered by ENT (Dr. Karolee Stamps); will see her for f/u on 8/22  She changed her Omeprazole to 4:00 PM before she eats  Mucus has been better recently - unsure if due to that change or the new meds she's been on with her recent pain  Was also referred to Dr. Shawnie Pons on 8/22    Joint pains are improved    Has not been taking Lipitor since early June    US thyroid***              Past Medical History:   Diagnosis Date    Basal cell carcinoma (BCC) of face 10/2018    excised by Dr Filbert Berthold     Breast cancer [facility]) 1999    in remission    Constipation 12/27/2018    COPD (chronic obstructive pulmonary disease) (HCC)     DDD (degenerative disc disease)     Large bowel obstruction (HCC) 02/2015    [facility]    LBP (low back pain)      Pharyngoesophageal dysphagia     SS (spinal stenosis)       Past Surgical History:   Procedure Laterality Date    APPENDECTOMY  1956    BLADDER SUSPENSION  1990    BREAST LUMPECTOMY Right 1999    CARDIAC CATHETERIZATION  12/21/2020    Dr Simone Curia    CHOLECYSTECTOMY  1990    EXCISION/BIOPSY Left 11/01/2018    Dr Filbert Berthold Laroy Apple, for [facility]    FEMORAL HERNIA REPAIR Left 02/20/2015    strangulated with small bowel resection - [facility],  [facility], [id number]    HYSTERECTOMY (CERVIX STATUS UNKNOWN)  1980    Done for endometriosis; done in [place]    LUMBAR SPINE SURGERY  01/03/2019    VERTEBRAL AUMENTATION  L1      OTHER SURGICAL HISTORY  11/14/13, 06/23/11, 06/30/11, 07/12/11 12/29/11    L4/L5 IESI    OTHER SURGICAL HISTORY  12/17/2013    L5/S1 IESI    OTHER SURGICAL HISTORY Bilateral 05/30/2014    SIJ and piriformis    OTHER SURGICAL HISTORY Bilateral 09/16/2014    L3, L4, L5 Diagnostic Medial Branch Block    OTHER SURGICAL HISTORY Bilateral 09/26/2014    L5 TFE    OTHER SURGICAL HISTORY  10/10/2014    bil L5 TFE    OTHER SURGICAL HISTORY  10/31/2014    caudal    SMALL INTESTINE SURGERY  02/20/2015    strangulated left femoral hernia    SPINE SURGERY N/A 01/03/2019    VERTEBRAL AUMENTATION  L1  (C-ARM X2, performed by Radonna Ricker, MD at [facility] OR    TONSILLECTOMY  1946    UPPER GASTROINTESTINAL ENDOSCOPY N/A 04/24/2019    EGD BIOPSY performed by Durene Fruits, MD at [facility]     Family History   Problem Relation Age of Onset    Other Mother         ALS - diagnosed at age 47    Stroke Father         age 43    Cancer Sister         pt thinks it was liver    Cancer Brother         leukemia    Diabetes Maternal Grandfather     Other Brother         drowned    Other Brother         died at age 64; think due to spinal meningitis    Lupus Sister     Alzheimer's Disease Sister     Dementia Sister     Mult Sclerosis Nephew     Mult Sclerosis Daughter      Social History     Tobacco Use    Smoking status: Former      Packs/day: 1.00     Years: 40.00     Pack years: 40.00     Types: Cigarettes     Start date: 01/10/1953     Quit date: 01/11/1988     Years since quitting: 33.6    Smokeless tobacco: Never    Tobacco comments:     quit 25 years ago 1990   Substance Use Topics    Alcohol use: No     Alcohol/week: 0.0 standard drinks      Current Outpatient Medications   Medication Sig Dispense Refill    traMADol (ULTRAM) 50 MG tablet Take 1 tablet by mouth every 6 hours as needed for Pain. Max Daily Amount: 200 mg      EQ 8HR ARTHRITIS PAIN RELIEF 650 MG extended release tablet TAKE 2 TABLETS BY MOUTH EVERY 8 HOURS AS NEEDED FOR PAIN FOR FEVER      latanoprost (XALATAN) 0.005 % ophthalmic solution       omeprazole (PRILOSEC) 40 MG delayed release capsule       LORazepam (ATIVAN) 0.5 MG tablet Take 1 tablet by mouth nightly for 90 days. Max Daily Amount: 0.5 mg 90 tablet 0    ibuprofen (ADVIL;MOTRIN) 600 MG tablet Take 1 tablet by mouth 2 times daily as needed for Pain 20 tablet 0    umeclidinium-vilanterol (ANORO ELLIPTA) 62.5-25 MCG/ACT inhaler Inhale 1 puff into the lungs daily 90 each 1    nitroGLYCERIN (NITROSTAT) 0.4 MG SL tablet Place 1 tablet under the tongue every 5 minutes as needed for Chest pain 25 tablet 3    citalopram (CELEXA) 20  MG tablet Take 1 tablet by mouth daily 90 tablet 3    atorvastatin (LIPITOR) 20 MG tablet Take 1 tablet by mouth daily 90 tablet 1    donepezil (ARICEPT) 5 MG tablet Take 1 tablet by mouth nightly 90 tablet 3    Probiotic Acidophilus (FLORANEX) TABS Take 1 tablet by mouth daily      polyethylene glycol (GLYCOLAX) 17 GM/SCOOP powder Take 17 g by mouth daily As needed.      alendronate (FOSAMAX) 70 MG tablet Take 1 tablet by mouth every 7 days 12 tablet 3    CRANBERRY PO Take by mouth      Cholecalciferol (VITAMIN D3) 50 MCG (2000 UT) CAPS Take by mouth      Handicap Placard MISC by Does not apply route Issue parking placard for person with disability; Applicant meets the qualifying disability  criteria. Length of time expected to have disability.  Prescription expires in 5 years from issuing date. 1 each 0    CALCIUM CARBONATE-VITAMIN D PO Take 600 mg by mouth 2 times daily Contains 600 mg Calcium & 800 IU Vitamin D3.      Multiple Vitamins-Minerals (THERAPEUTIC MULTIVITAMIN-MINERALS) tablet Take 1 tablet by mouth daily      polyvinyl alcohol-povidone (HYPOTEARS) 1.4-0.6 % ophthalmic solution Place 1-2 drops into both eyes as needed      albuterol sulfate HFA (PROVENTIL;VENTOLIN;PROAIR) 108 (90 Base) MCG/ACT inhaler Ventolin HFA 90 mcg/actuation aerosol inhaler   Inhale 2 puffs every 6 hours by inhalation route as needed.   ASTHMA (Patient not taking: Reported on 08/16/2021)       No current facility-administered medications for this visit.     Allergies   Allergen Reactions    Morphine Other (See Comments)     Night sweats,felt "loopy".    Pcn [Penicillins] Hives    Zithromax [Azithromycin] Hives    Adhesive Tape Rash       Health Maintenance   Topic Date Due    COVID-19 Vaccine (4 - Booster for Moderna series) 01/16/2020    Flu vaccine (1) 08/10/2021    Lipids  04/09/2022    Depression Screen  06/16/2022    Annual Wellness Visit (AWV)  06/16/2022    DTaP/Tdap/Td vaccine (2 - Td or Tdap) 09/28/2027    DEXA (modify frequency per FRAX score)  Completed    Shingles vaccine  Completed    Pneumococcal 65+ years Vaccine  Completed    Hepatitis A vaccine  Aged Out    Hib vaccine  Aged Out    Meningococcal (ACWY) vaccine  Aged Out       Subjective:      Review of Systems   Constitutional:  Positive for appetite change (decreased).   Gastrointestinal:  Positive for abdominal distention and abdominal pain. Negative for blood in stool and nausea. Diarrhea: has soft, mushy stools with the stool softeners/Miralax.  Neurological:  Positive for dizziness.     Objective:     Vitals:    08/16/21 1334   BP: 118/78   Site: Right Upper Arm   Position: Sitting   Pulse: 73   Resp: 16   Temp: 97.6 F (36.4 C)   TempSrc:  Temporal   SpO2: 95%   Weight: 140 lb 4.8 oz (63.6 kg)     Physical Exam    Assessment:      {There are no diagnoses linked to this encounter. (Refresh or delete this SmartLink)}       Plan:  No follow-ups on file.    No orders of the defined types were placed in this encounter.    No orders of the defined types were placed in this encounter.      Patient given educational materials - see patient instructions.  Discussed use, benefit, and side effects of prescribed medications.  All patient questions answered.  Pt voiced understanding.  Reviewed health maintenance.            Electronically signed by Belinda Fisher, DO, DO on 08/16/2021 at 2:03 PM

## 2021-08-22 ENCOUNTER — Inpatient Hospital Stay
Admission: EM | Admit: 2021-08-22 | Discharge: 2021-08-23 | Disposition: A | Discharge status: Home / Self Care | Payer: Medicare Other | Admitting: Internal Medicine

## 2021-08-22 ENCOUNTER — Emergency Department: Admit: 2021-08-23 | Payer: MEDICARE | Primary: Family Medicine

## 2021-08-22 DIAGNOSIS — I160 Hypertensive urgency: Principal | ICD-10-CM

## 2021-08-22 LAB — CBC WITH AUTO DIFFERENTIAL
Absolute Immature Granulocyte: 0.22 10*3/uL (ref 0.00–0.30)
Basophils %: 1 % (ref 0–2)
Basophils Absolute: 0.09 10*3/uL (ref 0.00–0.20)
Eosinophils %: 1 % (ref 1–4)
Eosinophils Absolute: 0.14 10*3/uL (ref 0.00–0.44)
Hematocrit: 42.3 % (ref 36.3–47.1)
Hemoglobin: 14.1 g/dL (ref 11.9–15.1)
Immature Granulocytes: 2 % — ABNORMAL HIGH
Lymphocytes %: 14 % — ABNORMAL LOW (ref 24–43)
Lymphocytes Absolute: 1.72 10*3/uL (ref 1.10–3.70)
MCH: 32.2 pg (ref 25.2–33.5)
MCHC: 33.3 g/dL (ref 25.2–33.5)
MCV: 96.6 fL (ref 82.6–102.9)
MPV: 10.1 fL (ref 8.1–13.5)
Monocytes %: 8 % (ref 3–12)
Monocytes Absolute: 1.02 10*3/uL (ref 0.10–1.20)
NRBC Automated: 0 per 100 WBC
Neutrophils %: 74 % — ABNORMAL HIGH (ref 36–65)
Neutrophils Absolute: 9.5 10*3/uL — ABNORMAL HIGH (ref 1.50–8.10)
Platelets: 217 10*3/uL (ref 138–453)
RBC: 4.38 m/uL (ref 3.95–5.11)
RDW: 14.6 % — ABNORMAL HIGH (ref 11.8–14.4)
WBC: 12.7 10*3/uL — ABNORMAL HIGH (ref 3.5–11.3)

## 2021-08-22 LAB — URINALYSIS WITH REFLEX TO CULTURE
Bilirubin Urine: NEGATIVE
Glucose, Ur: NEGATIVE mg/dL
Ketones, Urine: NEGATIVE mg/dL
Leukocyte Esterase, Urine: NEGATIVE
Nitrite, Urine: NEGATIVE
Protein, UA: NEGATIVE mg/dL
Specific Gravity, UA: 1.025 (ref 1.010–1.025)
Urobilinogen, Urine: NORMAL EU/dL (ref 0.0–1.0)
pH, UA: 5.5 (ref 5.0–6.0)

## 2021-08-22 LAB — COMPREHENSIVE METABOLIC PANEL
ALT: 11 U/L (ref 5–33)
AST: 13 U/L (ref <32)
Albumin/Globulin Ratio: 1.9 (ref 1.0–2.5)
Albumin: 4.7 g/dL (ref 3.5–5.2)
Alkaline Phosphatase: 46 U/L (ref 35–104)
Anion Gap: 12 mmol/L (ref 9–17)
BUN: 28 mg/dL — ABNORMAL HIGH (ref 8–23)
Bun/Cre Ratio: 31 — ABNORMAL HIGH (ref 9–20)
CO2: 24 mmol/L (ref 20–31)
Calcium: 9.6 mg/dL (ref 8.6–10.4)
Chloride: 103 mmol/L (ref 98–107)
Creatinine: 0.9 mg/dL (ref 0.5–0.9)
Est, Glom Filt Rate: >60 mL/min/{1.73_m2} (ref >60)
Glucose: 119 mg/dL — ABNORMAL HIGH (ref 70–99)
Potassium: 4.1 mmol/L (ref 3.7–5.3)
Sodium: 139 mmol/L (ref 135–144)
Total Bilirubin: 0.4 mg/dL (ref 0.3–1.2)
Total Protein: 7.2 g/dL (ref 6.4–8.3)

## 2021-08-22 LAB — MICROSCOPIC URINALYSIS
Epithelial Cells UA: 2 /HPF (ref 0–5)
RBC, UA: 5 /HPF (ref 0–4)
WBC, UA: 2 /HPF (ref 0–4)

## 2021-08-22 LAB — TROPONIN: Troponin, High Sensitivity: 23 ng/L — ABNORMAL HIGH (ref 0–14)

## 2021-08-22 LAB — POC GLUCOSE FINGERSTICK: POC Glucose: 116 mg/dL — ABNORMAL HIGH (ref 65–105)

## 2021-08-22 LAB — LACTATE, SEPSIS: Lactic Acid, Sepsis: 1 mmol/L (ref 0.5–1.9)

## 2021-08-22 NOTE — ED Provider Notes (Signed)
[facility]  [place]  [place] [address]  Phone: [phone]      Pt Name: Katie Juarez  IZT:[id number]  [place] [birth year]  Date of evaluation: 08/22/2021      CHIEF COMPLAINT       Chief Complaint   Patient presents with    Dizziness     Tired, can't focus, dizzy, feel shakey, vision feels blurred, thirsty.Feeling like this when I got up this morning around 0600. I think I have thrush on my tongue. Right hip pain that radiates to right leg that has been going on for a while.        HISTORY OF PRESENT ILLNESS   81 year old female presents to the emergency department today stating that all day long that she has had blurred vision, lightheadedness dizziness polyuria polydipsia as well as generalized fatigue.  She reports that she feels like her mouth is very dry.  She denies any chest pain.  No nausea vomiting.  Getting up move around makes her feel worse.  Nothing makes feel better.  She recently has been evaluated for some lower back issues.  She was placed on tramadol as well as ibuprofen.  She has been given a shot, 1 time, of steroids.  She denies being a diabetic.  Has been no other contemporaneous evaluation or management of the symptoms prior to arrival.  No dysuria or urinary hesitancy.    REVIEW OF SYSTEMS     Review of Systems   All other systems reviewed and are negative.      PAST MEDICAL HISTORY    has a past medical history of Basal cell carcinoma (BCC) of face, Breast cancer ([place]), Constipation, COPD (chronic obstructive pulmonary disease) ([place]), DDD (degenerative disc disease), Large bowel obstruction ([place]), LBP (low back pain), Pharyngoesophageal dysphagia, and SS (spinal stenosis).    SURGICAL HISTORY      has a past surgical history that includes Hysterectomy (1980); bladder suspension (1990); Cholecystectomy (1990); Breast lumpectomy (Right, 1999); other surgical history (11/14/13, 06/23/11, 06/30/11, 07/12/11 12/29/11); other surgical history (12/17/2013); other  surgical history (Bilateral, 05/30/2014); other surgical history (Bilateral, 09/16/2014); other surgical history (Bilateral, 09/26/2014); other surgical history (10/10/2014); other surgical history (10/31/2014); Tonsillectomy (1946); Appendectomy (1956); Small intestine surgery (02/20/2015); Femoral hernia repair (Left, 02/20/2015); Excision/Biopsy (Left, 11/01/2018); Lumbar spine surgery (01/03/2019); Spine surgery (N/A, 01/03/2019); Upper gastrointestinal endoscopy (N/A, 04/24/2019); and Cardiac catheterization (12/21/2020).    CURRENT MEDICATIONS       Previous Medications    ALBUTEROL SULFATE HFA (PROVENTIL;VENTOLIN;PROAIR) 108 (90 BASE) MCG/ACT INHALER    Ventolin HFA 90 mcg/actuation aerosol inhaler   Inhale 2 puffs every 6 hours by inhalation route as needed.   ASTHMA    ALENDRONATE (FOSAMAX) 70 MG TABLET    Take 1 tablet by mouth every 7 days    ATORVASTATIN (LIPITOR) 20 MG TABLET    Take 1 tablet by mouth daily    CALCIUM CARBONATE-VITAMIN D PO    Take 600 mg by mouth 2 times daily Contains 600 mg Calcium & 800 IU Vitamin D3.    CHOLECALCIFEROL (VITAMIN D3) 50 MCG (2000 UT) CAPS    Take by mouth    CITALOPRAM (CELEXA) 20 MG TABLET    Take 1 tablet by mouth daily    CRANBERRY PO    Take by mouth    DONEPEZIL (ARICEPT) 5 MG TABLET    Take 1 tablet by mouth nightly    EQ 8HR ARTHRITIS PAIN RELIEF 650 MG EXTENDED  RELEASE TABLET    TAKE 2 TABLETS BY MOUTH EVERY 8 HOURS AS NEEDED FOR PAIN FOR FEVER    HANDICAP PLACARD MISC    by Does not apply route Issue parking placard for person with disability; Applicant meets the qualifying disability criteria. Length of time expected to have disability.  Prescription expires in 5 years from issuing date.    IBUPROFEN (ADVIL;MOTRIN) 600 MG TABLET    Take 1 tablet by mouth 2 times daily as needed for Pain    LATANOPROST (XALATAN) 0.005 % OPHTHALMIC SOLUTION        LORAZEPAM (ATIVAN) 0.5 MG TABLET    Take 1 tablet by mouth nightly for 90 days. Max Daily Amount: 0.5 mg     MULTIPLE VITAMINS-MINERALS (THERAPEUTIC MULTIVITAMIN-MINERALS) TABLET    Take 1 tablet by mouth daily    NITROGLYCERIN (NITROSTAT) 0.4 MG SL TABLET    Place 1 tablet under the tongue every 5 minutes as needed for Chest pain    OMEPRAZOLE (PRILOSEC) 40 MG DELAYED RELEASE CAPSULE        POLYETHYLENE GLYCOL (GLYCOLAX) 17 GM/SCOOP POWDER    Take 17 g by mouth daily As needed.    POLYVINYL ALCOHOL-POVIDONE (HYPOTEARS) 1.4-0.6 % OPHTHALMIC SOLUTION    Place 1-2 drops into both eyes as needed    PROBIOTIC ACIDOPHILUS (FLORANEX) TABS    Take 1 tablet by mouth daily    TRAMADOL (ULTRAM) 50 MG TABLET    Take 2 tablets by mouth every 8 hours as needed for Pain for up to 15 days. Max Daily Amount: 300 mg    UMECLIDINIUM-VILANTEROL (ANORO ELLIPTA) 62.5-25 MCG/ACT INHALER    Inhale 1 puff into the lungs daily       ALLERGIES     is allergic to morphine, pcn [penicillins], zithromax [azithromycin], and adhesive tape.    FAMILY HISTORY     She indicated that her mother is deceased. She indicated that her father is deceased. She indicated that two of her five sisters are alive. She indicated that two of her five brothers are alive. She indicated that the status of her maternal grandfather is unknown. She indicated that the status of her daughter is unknown. She indicated that the status of her nephew is unknown.     family history includes Alzheimer's Disease in her sister; Cancer in her brother and sister; Dementia in her sister; Diabetes in her maternal grandfather; Lupus in her sister; Mult Sclerosis in her daughter and nephew; Other in her brother, brother, and mother; Stroke in her father.    SOCIAL HISTORY      reports that she quit smoking about 33 years ago. Her smoking use included cigarettes. She started smoking about 68 years ago. She has a 40.00 pack-year smoking history. She has never used smokeless tobacco. She reports that she does not drink alcohol and does not use drugs.    PHYSICAL EXAM     INITIAL VITALS:  height  is '5\' 4"'$  (1.626 m) and weight is 140 lb 6.4 oz (63.7 kg). Her tympanic temperature is 98.6 F (37 C). Her blood pressure is 209/94 (abnormal) and her pulse is 55. Her respiration is 24 and oxygen saturation is 96%.   Physical Exam  Vitals reviewed.   Constitutional:       General: She is not in acute distress.     Appearance: She is well-developed.   HENT:      Head: Normocephalic and atraumatic.   Eyes:      Conjunctiva/sclera:  Conjunctivae normal.      Pupils: Pupils are equal, round, and reactive to light.   Neck:      Trachea: No tracheal deviation.   Cardiovascular:      Rate and Rhythm: Normal rate and regular rhythm.   Pulmonary:      Effort: No respiratory distress.      Breath sounds: Normal breath sounds.   Abdominal:      General: Bowel sounds are normal. There is no distension.      Palpations: Abdomen is soft.      Tenderness: There is no abdominal tenderness.   Musculoskeletal:         General: No tenderness. Normal range of motion.      Cervical back: Normal range of motion and neck supple.   Skin:     General: Skin is warm and dry.   Neurological:      Mental Status: She is alert and oriented to person, place, and time.   Psychiatric:         Behavior: Behavior normal.         Thought Content: Thought content normal.         Judgment: Judgment normal.         DIFFERENTIAL DIAGNOSIS/ MDM:   I will look for infectious etiology as well as cardiac etiologies.  Certainly patient to be new onset diabetic or steroid-induced hyperglycemia.    DIAGNOSTIC RESULTS     EKG:  EKG is interpreted by myself showing sinus bradycardia with a rate of 57.  I do not appreciate acute ST segment changes.  Axis and intervals are otherwise normal.    RADIOLOGY:   CT Abdomen Pelvis W Contrast    Result Date: 08/09/2021                                          [facility]                                         [address] [place] [address]                                                                                     CT Scan Report / CT abdomen pelvis w con                                                 Signed    Patient: Therese, Rocco  MR#: [id number]          DOB: 05-29-1940                                                                [id number]            Age/Sex: 79 / F                                                                ADM Date: 08/07/21            Loc: [facility]                                                                                    Attending Dr:   Darl Householder Care Dr. [name] D.O.      Ordering Physician: Eugenia Mcalpine M.D.  Date of Service: 08/07/21  Procedure(s): CT abdomen pelvis w con  Accession Number(s): [id number]    cc: Vicente Serene M.D.       CLINICAL HISTORY:  Abdominal pain and possible small bowel obstruction.      DOSE LOWERING EXPOSURE/TECHNIQUE:  This CT scan was performed using optimization techniques as follows:  automatic exposure control, adjustment of MA or KV according to patient size and use of iterative reconstruction technique.      TECHNIQUE/VIEWS:  Axial images obtained along with multiplanar reconstructed images from the 3D data set that includes coronal and sagittal views.  CT of the abdomen and pelvis with contrast.    COMPARISON:  08/05/2021    FINDINGS:      The scans through the lung bases show no abnormal lung masses.  The scans through the liver show no abnormal mass or significant distention of the intrahepatic biliary tree.  Post secondary changes are noted.  The scans through the spleen show no abnormal masses.  The pancreas is normal in size.  The adrenal glands are normal in size.  The scans through the kidneys show no evidence of hydronephrosis or renal mass.  The appendix is not definitely visualized but no pericecal stranding to suggest appendicitis.    There is no evidence of enlargement of the  retroperitoneal or mesenteric lymph nodes.    There is mild dilatation of the proximal small bowel loops.  The contrast transits to the colon.  Multiple diverticula are noted mainly in the sigmoid colon.  There is thickening of the colon mucosa mainly at the sigmoid colon suggesting colitis.    The urinary bladder is unremarkable.    Post hysterectomy changes are noted.    The osseous structures in the lumbar spine and the visualized ribs show no evidence of osteolytic or osteoblastic lesions.    IMPRESSION:  1.  Ileus.   2.  Diverticulosis.   3.  Questionable colitis of the sigmoid colon.   4.  Oral contrast transits to the colon.         Dictated ByEllsworth Lennox M.D.                                           Signed By:          <Electronically signed by Ellsworth Lennox, M.D.>                                          08/09/21 1322                                                                                                                                                                          DD: 08/09/21                                                        TD/TT: 08/09/21 0722     Transcriptionist: ASF    CT Abdomen Pelvis W Contrast    Result Date: 08/05/2021                                          [facility]                                         [address] [place] [address]  CT Scan Report / CT abdomen pelvis w con                                                 Signed    Patient: Cuca, Benassi                                                                MR#: [id number]          DOB: 1940/02/06                                                                [id number]            Age/Sex: 77 / F                                                                ADM Date: 08/05/21            Loc: E[facility]                                                                                     Attending Dr:   Darl Householder Care Dr. [name] D.O.      Ordering Physician: Sherilyn Banker MD  Date of Service: 08/05/21  Procedure(s): CT abdomen pelvis w con  Accession Number(s): [id number]    cc: Vicente Serene M.D.       CLINICAL HISTORY:  81 year-old female with right lower quadrant pain.  Patient has a surgical history of appendectomy, cholecystectomy. lumbar spine and multiple kyphoplasties.    DOSE LOWERING EXPOSURE/TECHNIQUE:  This CT scan was performed using optimization techniques as follows:  automatic exposure control, adjustment of MA or KV according to patient size and use of iterative reconstruction technique.      TECHNIQUE/VIEWS:  Axial images obtained along with multiplanar reconstructed images from the 3D data set that includes coronal and sagittal views.  CT of the abdomen and pelvis with IV contrast.    COMPARISON:  No relevant prior studies available.    FINDINGS:     NOTE:  This exam has limited diagnostic value without oral contrast.      LOWER THORAX:      Lung Bases:  Emphysematous lungs.          ABDOMEN:    Liver and Spleen:  No focal lesions. Mild dilatation  of the intra- and extrahepatic bile ducts post-cholecystectomy.    Gallbladder and Bile Ducts:  Cholecystectomy.      Pancreas:  No evidence of pancreatitis.  No masses.      Kidneys and Adrenal Glands:  Both kidneys enhance.  There are cysts in both kidneys.  No adrenal masses.       Aorta and IVC:  The aorta is tortuous and there is atherosclerosis without evidence of an aneurysm.    Retroperitoneum and Mesentery:  Unremarkable.  No adenopathy or free fluid.       Gastrointestinal:  The left hemidiaphragm is elevated due to interposition of the bowel, stomach, spleen and left kidney.  There is no bowel obstruction.  There is moderate diverticulosis, primarily of the distal left hemicolon.  There appears to be a suture line in the mid small bowel.      PELVIS:     Bladder:  Unremarkable.    Reproductive:  Apparent hysterectomy.      Musculoskeletal:  Prior kyphoplasties T12 and L1.  Posterior fusion L4-5.        IMPRESSION:  1.  Markedly elevated left hemidiaphragm with interposition of the stomach, bowel, spleen and left kidney.  2.  Emphysematous lungs.  3.  Moderate diverticulosis coli.    4.  Kyphoplasties L1 and L2.  5.  Posterior fusion hardware L4-L5.        Dictated By:        Charlotta Newton D.O.                                              Signed By:          <Electronically signed by Charlotta Newton, D.O.>                                          08/05/21 1622                                                                                                                                                                          DD: 08/05/21                                                        TD/TT: 08/05/21 1014     Transcriptionist: NEE    FL MODIFIED BARIUM Dyane Dustman  VIDEO    Result Date: 08/20/2021                                          [facility]                                         [place] [address]                                                                                 Fluoroscopy Report / [place] modified [place] W speech                                                 Signed    Patient: Tashi, Andujo                                                                MR#: [id number]          DOB: Jul 08, 1940                                                                [id number]            Age/Sex: 47 / F                                                                ADM Date: 08/20/21            Loc: RAD  Attending Dr: Steve Rattler M.D.  Primary Care Dr. [name] D.O.      Ordering Physician: Steve Rattler M.D.  Date of Service: 08/20/21  Procedure(s): FL modified BASO4 W  speech  Accession Number(s): [id number]    cc: Valarie Cones T M.D.; [name] D.O.       TECHNIQUE/VIEWS:  Radiation Exposure Indices:  Reference air kerma 29.4 mGy.    CLINICAL HISTORY: 81 year-old female with difficulty swallowing     COMPARISON:  No relevant prior studies available.    FINDINGS:     A modified barium swallow study was performed in conjunction with the Department of Speech Pathology with a swallowing therapist.      There was no penetration or aspiration with the various consistencies of barium as well as applesauce, fruit and a PopTart.    There was occasional premature spillage with residuals in the vallecula which were cleared with subsequent swallows.      The patient had no difficulty swallowing a pill which briefly lodged in the vallecula and was cleared with a subsequent swallow.          Dictated By:        Charlotta Newton D.O.                                              Signed By:          <Electronically signed by Charlotta Newton, D.O.>                                          08/20/21 1542                                                                                                                                                                          DD: 08/20/21                                                        TD/TT: 08/20/21 1438     Transcriptionist: NEE    MRI LUMBAR SPINE WO CON    Result Date: 08/18/2021  [facility]                                         [place] [address]                                                                               Magnetic Resonance Report / MR lumbar spine wo con                                                 Signed    Patient: Marishka, Rentfrow                                                                MR#: [id number]          DOB: Jan 21, 1940                                                                 [id number]            Age/Sex: 30 / F                                                                ADM Date: 08/17/21            Loc: ST                                                                                     Attending Dr: Steve Rattler M.D.  Primary Care Dr. [name] D.O.      Ordering Physician: Gustavus Bryant PA  Date of Service: 08/17/21  Procedure(s): MR lumbar spine wo con  Accession Number(s): [id number]    cc: Almardeeni,Diala T  M.D.; [name] D.O.; Gustavus Bryant PA       TECHNIQUE/VIEWS:  Multiplanar, multisequence MR imaging of the lumbar spine without contrast     CLINICAL HISTORY:   Right hip pain    COMPARISON:  06/30/15 MRI lumbar spine and CT abdomen and pelvis 08/07/2021     FINDINGS:      The conus is posterior to L1-2.    There is posterior fusion hardware L4-5.    There are kyphoplasties of T12, L1.      The lumbar lordosis is mildly exaggerated.    There is an S-shaped scoliosis.      There are multiple left renal cysts.      L1-2:  There is chronic 5 mm of retropulsion L1 subtending the conus medullaris.   Mild disc disease and disc bulging with mild bilateral facet arthropathy with no foraminal or canal stenosis.      L2-3:  Moderate disc disease and symmetric disc bulging with moderate bilateral facet arthropathy contributing to mild to moderate bilateral foraminal stenosis and no canal stenosis.    L3-4:  Moderate disc disease with a right focal disc protrusion extending inferiorly on the right to the L4 vertebral body with moderately severe facet arthropathy causing moderately severe right and moderate left foraminal stenosis but no canal stenosis.    L4-5:  Moderate disc disease and asymmetric disc bulging, greater rightward, contributing to moderate right and mild left foraminal stenosis but no canal stenosis.      L5-S1:  Moderate disc disease and disc bulging with moderately severe bilateral facet arthropathy causing moderate left and mild right  foraminal stenosis but no canal stenosis.      IMPRESSION:  1.  Degenerative S-shaped scoliosis.  2.  Kyphoplasties T12 and L1.  3.  Posterior lumbar fusion with hardware L4-5.  4.  L1-2 has 5 mm of chronic retropulsion with mild disc disease and disc bulging with no foraminal or canal stenosis.    5.  L2-3 moderate disc bulging with moderate facet arthropathy causing mild to moderate bilateral foraminal stenosis and no canal stenosis.  6.  L3-4 right posterior disc protrusion extending inferiorly posterior to L4 with moderately severe facet arthropathy causing moderately severe right and moderate left foraminal stenosis but no canal stenosis.  7.  L4-5 asymmetric disc bulging, greater on the right, with moderate disc disease and facet arthropathy causing moderate right and mild left foraminal stenosis.  8.  L5-S1 symmetric disc bulging with moderately severe facet arthropathy causing moderate left and mild right foraminal stenosis and no canal stenosis.        Dictated By:        Charlotta Newton D.O.                                              Signed By:          <Electronically signed by Charlotta Newton, D.O.>                                          08/18/21 1341  DD: 08/18/21                                                        TD/TT: 08/18/21 1258     Transcriptionist: NEE    XR ABDOMEN (2 VIEWS)    Result Date: 08/09/2021                                          [facility]                                         [place] [address]                                                                                         XRay Report / XR abdomen min 2V                                                 Signed    Patient: Giuseppina, Quinones                                                                 MR#: [id number]          DOB: 02-21-1940                                                                [id number]            Age/Sex: 69 / F                                                                ADM Date: 08/07/21            Loc: ECS  Attending Dr:   Darl Householder Care Dr. [name] D.O.      Ordering Physician: Eugenia Mcalpine M.D.  Date of Service: 08/07/21  Procedure(s): XR abdomen min 2V  Accession Number(s): [id number]    cc: [name] D.O.; Kettinger,James M.D.       TECHNIQUE/VIEWS:  2 views    CLINICAL HISTORY: Abdominal pain.     COMPARISON: 09/12/2020    FINDINGS:     Two views of the abdomen were obtained in the flat and upright positions.    Multiple dilated proximal small bowel loops suggesting ileus or partial small bowel obstruction.  Fecal residue and air are noted throughout the colon and in the rectum.  There is no evidence of free air in the abdomen.  The osseous structures in the lumbar spine and pelvis show no definite abnormality.  No abnormal calcifications are seen projecting over the kidneys.  Fusion hardware is seen at the L4-L5 and vertebral augmentation changes are seen at T12 and L1.      IMPRESSION:  1.  Ileus versus partial small bowel obstruction requires clinical correlation.          Dictated ByEllsworth Lennox M.D.                                           Signed By:          <Electronically signed by Ellsworth Lennox, M.D.>                                          08/09/21 1322                                                                                                                                                                          DD: 08/09/21                                                        TD/TT: 08/09/21 0726     Transcriptionist: ASF        LABS:  Results for orders placed or performed during the hospital encounter of 08/22/21   Blood  Culture 1    Specimen: Blood   Result Value Ref Range    Specimen Description .BLOOD 10 ML LFT AC     Special Requests  Culture PENDING    CBC with Auto Differential   Result Value Ref Range    WBC 12.7 (H) 3.5 - 11.3 k/uL    RBC 4.38 3.95 - 5.11 m/uL    Hemoglobin 14.1 11.9 - 15.1 g/dL    Hematocrit 42.3 36.3 - 47.1 %    MCV 96.6 82.6 - 102.9 fL    MCH 32.2 25.2 - 33.5 pg    MCHC 33.3 25.2 - 33.5 g/dL    RDW 14.6 (H) 11.8 - 14.4 %    Platelets 217 138 - 453 k/uL    MPV 10.1 8.1 - 13.5 fL    NRBC Automated 0.0 0.0 per 100 WBC    Neutrophils % 74 (H) 36 - 65 %    Lymphocytes % 14 (L) 24 - 43 %    Monocytes % 8 3 - 12 %    Eosinophils % 1 1 - 4 %    Basophils % 1 0 - 2 %    Immature Granulocytes 2 (H) 0 %    Neutrophils Absolute 9.50 (H) 1.50 - 8.10 k/uL    Lymphocytes Absolute 1.72 1.10 - 3.70 k/uL    Monocytes Absolute 1.02 0.10 - 1.20 k/uL    Eosinophils Absolute 0.14 0.00 - 0.44 k/uL    Basophils Absolute 0.09 0.00 - 0.20 k/uL    Absolute Immature Granulocyte 0.22 0.00 - 0.30 k/uL    RBC Morphology ANISOCYTOSIS PRESENT    Comprehensive Metabolic Panel   Result Value Ref Range    Glucose 119 (H) 70 - 99 mg/dL    BUN 28 (H) 8 - 23 mg/dL    Creatinine 0.9 0.5 - 0.9 mg/dL    Est, Glom Filt Rate >60 >60 mL/min/1.17m    Bun/Cre Ratio 31 (H) 9 - 20    Calcium 9.6 8.6 - 10.4 mg/dL    Sodium 139 135 - 144 mmol/L    Potassium 4.1 3.7 - 5.3 mmol/L    Chloride 103 98 - 107 mmol/L    CO2 24 20 - 31 mmol/L    Anion Gap 12 9 - 17 mmol/L    Alkaline Phosphatase 46 35 - 104 U/L    ALT 11 5 - 33 U/L    AST 13 <32 U/L    Total Bilirubin 0.4 0.3 - 1.2 mg/dL    Total Protein 7.2 6.4 - 8.3 g/dL    Albumin 4.7 3.5 - 5.2 g/dL    Albumin/Globulin Ratio 1.9 1.0 - 2.5   Lactate, Sepsis   Result Value Ref Range    Lactic Acid, Sepsis 1.0 0.5 - 1.9 mmol/L   Troponin   Result Value Ref Range    Troponin, High Sensitivity 23 (H) 0 - 14 ng/L   POC Glucose Fingerstick   Result Value Ref Range    POC Glucose 116 (H) 65 - 105 mg/dL   EKG  12 Lead   Result Value Ref Range    Ventricular Rate 57 BPM    Atrial Rate 57 BPM    P-R Interval 158 ms    QRS Duration 74 ms    Q-T Interval 442 ms    QTc Calculation (Bazett) 430 ms    P Axis 43 degrees    R Axis 45 degrees    T Axis 26 degrees       EMERGENCY DEPARTMENT COURSE:     Thepatient was given the following medications:  No orders of the defined types were placed in this encounter.  Vitals:    Vitals:    08/22/21 1813   BP: (!) 209/94   Pulse: 55   Resp: 24   Temp: 98.6 F (37 C)   TempSrc: Tympanic   SpO2: 96%   Weight: 140 lb 6.4 oz (63.7 kg)   Height: '5\' 4"'$  (1.626 m)     -------------------------  BP: (!) 209/94, Temp: 98.6 F (37 C), Pulse: 55, Respirations: 24      Re-evaluation Notes  At 1930 hrs. of endorsed patient [place] provider.  Please see her addendum.    CONSULTS:  None    CRITICAL CARE:   None    PROCEDURES:  None    (Please note that portions of this note were completed with a voice recognitionprogram.  Efforts were made to edit the dictations but occasionally words are mis-transcribed.)    Manuela Neptune, MD MD, F.A.C.E.P, F.A.A.E.M  Emergency Physician Attending         Manuela Neptune, MD  08/22/21 1916

## 2021-08-22 NOTE — ED Notes (Signed)
Patient & daughter requesting to take home meds. Patient prescribed Tramadol 50 mg (2tabs) and Tylenol 650 (2tabs). Per Dr. Sheppard Coil patient okay to have home medications. RN verified with home medication list and prescription bottles. RN at bedside when patient took meds. Patient and family member deny further needs at this time.     Alexia Freestone, RN  08/22/21 2322

## 2021-08-22 NOTE — ED Provider Notes (Addendum)
ATTENDING  ADDENDUM     Care of this patient was assumed from Dr. Lovena Le    The patient was seen for Dizziness (Tired, can't focus, dizzy, feel shakey, vision feels blurred, thirsty.Feeling like this when I got up this morning around 0600. I think I have thrush on my tongue. Right hip pain that radiates to right leg that has been going on for a while. )  .      The patient's initial evaluation and plan have been discussed with the prior provider who initially evaluated the patient.  Nursing Notes, Past Medical Hx, Past Surgical Hx, Social Hx, Allergies, and Family Hx were all reviewed.      ED COURSE      The patient was given the following medications:  Orders Placed This Encounter   Medications    DISCONTD: lisinopril (PRINIVIL;ZESTRIL) tablet 5 mg    DISCONTD: lisinopril (PRINIVIL;ZESTRIL) tablet 5 mg    iopamidol (ISOVUE-370) 76 % injection 75 mL    hydrALAZINE (APRESOLINE) injection 10 mg    DISCONTD: niCARdipine (CARDENE) 25 mg in sodium chloride 0.9 % 250 mL infusion     Order Specific Question:   Titrate Infusion?     Answer:   Yes     Order Specific Question:   Initial Infusion Rate:     Answer:   5 mg/hr     Order Specific Question:   Goal of Therapy is:     Answer:   SBP 140-160 mmHg     Order Specific Question:   Contact Provider if:     Answer:   Patient is receiving the maximum dose and is not achieving the goal of therapy    DISCONTD: niCARdipine (CARDENE) 25 mg in sodium chloride 0.9 % 250 mL infusion     Order Specific Question:   Titrate Infusion?     Answer:   Yes     Order Specific Question:   Initial Infusion Rate:     Answer:   5 mg/hr     Order Specific Question:   Goal of Therapy is:     Answer:   Other     Order Specific Question:   Other Goal:     Answer:   sbp <170     Order Specific Question:   Contact Provider if:     Answer:   Patient is receiving the maximum dose and is not achieving the goal of therapy    DISCONTD: albuterol sulfate HFA (PROVENTIL;VENTOLIN;PROAIR) 108 (90 Base)  MCG/ACT inhaler 2 puff     Order Specific Question:   Initiate RT Bronchodilator Protocol     Answer:   No    DISCONTD: donepezil (ARICEPT) tablet 5 mg    DISCONTD: polyethylene glycol (GLYCOLAX) powder 17 g    DISCONTD: traMADol (ULTRAM) tablet 100 mg    DISCONTD: tiotropium-olodaterol (STIOLTO) 2.5-2.5 MCG/ACT inhaler 2 puff    DISCONTD: pantoprazole (PROTONIX) tablet 40 mg    DISCONTD: LORazepam (ATIVAN) tablet 0.5 mg    DISCONTD: citalopram (CELEXA) tablet 20 mg    DISCONTD: atorvastatin (LIPITOR) tablet 20 mg    DISCONTD: ibuprofen (ADVIL;MOTRIN) tablet 600 mg    DISCONTD: sodium chloride flush 0.9 % injection 5-40 mL    DISCONTD: sodium chloride flush 0.9 % injection 5-40 mL    DISCONTD: 0.9 % sodium chloride infusion    DISCONTD: ondansetron (ZOFRAN-ODT) disintegrating tablet 4 mg    DISCONTD: ondansetron (ZOFRAN) injection 4 mg    DISCONTD: polyethylene glycol (GLYCOLAX) packet 17  g    DISCONTD: acetaminophen (TYLENOL) tablet 650 mg    DISCONTD: acetaminophen (TYLENOL) suppository 650 mg    DISCONTD: albuterol (PROVENTIL) (2.5 MG/3ML) 0.083% nebulizer solution 2.5 mg     Order Specific Question:   Initiate RT Bronchodilator Protocol     Answer:   No    DISCONTD: sodium chloride nebulizer 0.9 % solution 3 mL    DISCONTD: albuterol (PROVENTIL) (2.5 MG/3ML) 0.083% nebulizer solution 2.5 mg     Order Specific Question:   Initiate RT Bronchodilator Protocol     Answer:   No    DISCONTD: donepezil (ARICEPT) tablet 5 mg    DISCONTD: acetaminophen (TYLENOL) tablet 1,300 mg    DISCONTD: acetaminophen (TYLENOL) extended release tablet 1,300 mg (Patient Supplied)    DISCONTD: latanoprost (XALATAN) 0.005 % ophthalmic solution 1 drop    DISCONTD: aspirin EC tablet 81 mg    DISCONTD: lisinopril (PRINIVIL;ZESTRIL) tablet 10 mg    lisinopril (PRINIVIL;ZESTRIL) tablet 5 mg    aspirin 81 MG EC tablet     Sig: Take 1 tablet by mouth daily     Dispense:  30 tablet     Refill:  0    lisinopril (PRINIVIL;ZESTRIL) 10 MG tablet      Sig: Take 1 tablet by mouth daily     Dispense:  30 tablet     Refill:  0    DISCONTD: HYDROcodone-acetaminophen (NORCO) 5-325 MG per tablet     Sig: Take 1 tablet by mouth every 6 hours as needed for Pain for up to 3 days. Intended supply: 7 days. Take lowest dose possible to manage pain Max Daily Amount: 4 tablets     Dispense:  10 tablet     Refill:  0     Reduce doses taken as pain becomes manageable    lactulose (CHRONULAC) 10 GM/15ML solution     Sig: Take 15 mLs by mouth every evening     Dispense:  473 mL     Refill:  0    nystatin (MYCOSTATIN) 100000 UNIT/ML suspension     Sig: Take 5 mLs by mouth 4 times daily for 10 days Retain in mouth as long as possible     Dispense:  200 mL     Refill:  0       RECENT VITALS:   Temp: 98 F (36.7 C), Pulse: (!) 104, Respirations: 16, BP: 92/63    MEDICAL DECISION MAKING       I did receive care from Dr. Lovena Le, patient presented with vague complaints today including generally feeling unwell, blurred vision, dizziness, no fall, no head trauma, does have right hip pain has been chronic in nature although reports that they are working this up outpatient with attempts to get in with pain management there are more concern for generally feeling unwell, blurred vision, dizziness which did start today.    Does Not have hypertension history which was pointed out by Dr. Lovena Le to me, when I discussed this with patient again she denies history of hypertension.  Not on any antihypertensives.  Did have Cath in 2022 which did not show significant stenosis.  Of note patient has been mildly bradycardic as well.  Due to persistent symptoms despite pain control on my reevaluation, patient states that she is not having terrible hip pain and is controlled at this time with her medications although patient is still bradycardic and hypertensive.    Patient neurologically intact although continued concerning symptoms with persistent and consistent  dizziness, headache, blurred vision cute  onset today. I am recommending CT head and CTA head and neck to rule out intracranial hemorrhage versus intracranial ischemia as I would like to start treating her elevated blood pressure, I would like to rule out intracranial abnormality prior to initiation of blood pressure as if there is an ischemic lesion and blood pressure parameters may change.  Patient and daughter are comfortable compliant with this plan to obtain CT head imaging.    ED Course as of 08/26/21 1512   Sun Aug 22, 2021   2301 CTA HEAD NECK W CONTRAST     IMPRESSION:  1. No acute intracranial abnormality.  2. No acute arterial abnormality or hemodynamically significant arterial  stenosis in the head or neck.  3. 1.4 cm incidental left thyroid nodule. No follow-up imaging is recommended.  Reference: J Am Coll Radiol. 2015 Feb;12(2): 143-50 [MA]      ED Course User Index  [MA] Lonzo Cloud, DO     With negative imaging I discussed initiation of blood pressure medications with patient, patient not on any antihypertensives.  Patient with no significant Cardiac history, not on any antihypertensives, will attempt I did attempt 5 mg of lisinopril p.o. as initial dose of antihypertensive although patient did not respond, IV hydralazine provided 10 mg IV, mild improvement of systolic blood pressure from 627 to 035 systolic.  At this time I do believe that patient will require IV medications to facilitate blood pressure control, will initiate Cardene, discussed this with patient and family they are comfortable compliant with this plan for initiation of Cardene drip for hypertension and plan for admission to the hospital for bridging to oral medications as well as further cardiac monitoring and work-up as they see fit.      Marshia Ly, DO  Emergency Medicine Attending        Lonzo Cloud, DO  08/23/21 [place], DO  08/26/21 1512

## 2021-08-23 ENCOUNTER — Inpatient Hospital Stay: Admit: 2021-08-23 | Payer: MEDICARE | Primary: Family Medicine

## 2021-08-23 ENCOUNTER — Encounter

## 2021-08-23 ENCOUNTER — Inpatient Hospital Stay: Admit: 2021-08-23 | Discharge: 2021-09-03 | Payer: MEDICARE | Primary: Family Medicine

## 2021-08-23 DIAGNOSIS — I160 Hypertensive urgency: Secondary | ICD-10-CM

## 2021-08-23 LAB — ECHO (TTE) COMPLETE (PRN CONTRAST/BUBBLE/STRAIN/3D)
AR Max Velocity PISA: 3.7 m/s
AR PHT: 533 ms
AV Peak Gradient: 13 mmHg
AV Peak Velocity: 1.8 m/s
AV Velocity Ratio: 0.72
Ao Root Index: 1.73 cm/m2
Aortic Root: 2.9 cm
Body Surface Area: 1.69 m2
E/E' Lateral: 19.2
E/E' Ratio (Averaged): 21.6
E/E' Septal: 24
EF BP: 65 % (ref 55–100)
Est. RA Pressure: 3 mmHg
Fractional Shortening 2D: 18 % (ref 28–44)
IVSd: 1.3 cm — AB (ref 0.6–0.9)
LA Area 4C: 21.4 cm2
LA Diameter: 3.2 cm
LA Major Axis: 5.7 cm
LA Size Index: 1.9 cm/m2
LA Volume A-L A4C: 63 mL — AB (ref 22–52)
LA Volume Index A-L A4C: 38 mL/m2 — AB (ref 16–34)
LA/AO Root Ratio: 1.1
LV E' Lateral Velocity: 5 cm/s
LV E' Septal Velocity: 4 cm/s
LV Mass 2D Index: 82.6 g/m2 (ref 43–95)
LV Mass 2D: 138.8 g (ref 67–162)
LV RWT Ratio: 0.71
LVIDd Index: 2.02 cm/m2
LVIDd: 3.4 cm — AB (ref 3.9–5.3)
LVIDs Index: 1.67 cm/m2
LVIDs: 2.8 cm
LVOT Peak Gradient: 6 mmHg
LVOT Peak Velocity: 1.3 m/s
LVPWd: 1.2 cm — AB (ref 0.6–0.9)
MV A Velocity: 1.45 m/s
MV E Velocity: 0.96 m/s
MV E Wave Deceleration Time: 289 ms
MV E/A: 0.66
MV Max Velocity: 1.6 m/s
MV Peak Gradient: 10 mmHg
PV Max Velocity: 1.1 m/s
PV Peak Gradient: 4 mmHg
RVSP: 41 mmHg
TR Max Velocity: 3.08 m/s
TR Peak Gradient: 38 mmHg
TV PG: 3 mmHg

## 2021-08-23 LAB — CBC WITH AUTO DIFFERENTIAL
Absolute Immature Granulocyte: 0.21 10*3/uL (ref 0.00–0.30)
Basophils %: 0 % (ref 0–2)
Basophils Absolute: 0.06 10*3/uL (ref 0.00–0.20)
Eosinophils %: 0 % — ABNORMAL LOW (ref 1–4)
Eosinophils Absolute: 0.05 10*3/uL (ref 0.00–0.44)
Hematocrit: 40 % (ref 36.3–47.1)
Hemoglobin: 13.3 g/dL (ref 11.9–15.1)
Immature Granulocytes: 2 % — ABNORMAL HIGH
Lymphocytes %: 9 % — ABNORMAL LOW (ref 24–43)
Lymphocytes Absolute: 1.24 10*3/uL (ref 1.10–3.70)
MCH: 31.7 pg (ref 25.2–33.5)
MCHC: 33.3 g/dL (ref 25.2–33.5)
MCV: 95.5 fL (ref 82.6–102.9)
MPV: 9.8 fL (ref 8.1–13.5)
Monocytes %: 7 % (ref 3–12)
Monocytes Absolute: 0.92 10*3/uL (ref 0.10–1.20)
NRBC Automated: 0 per 100 WBC
Neutrophils %: 82 % — ABNORMAL HIGH (ref 36–65)
Neutrophils Absolute: 11.32 10*3/uL — ABNORMAL HIGH (ref 1.50–8.10)
Platelets: 210 10*3/uL (ref 138–453)
RBC: 4.19 m/uL (ref 3.95–5.11)
RDW: 14.6 % — ABNORMAL HIGH (ref 11.8–14.4)
WBC: 13.8 10*3/uL — ABNORMAL HIGH (ref 3.5–11.3)

## 2021-08-23 LAB — EKG 12-LEAD
Atrial Rate: 54 {beats}/min
Atrial Rate: 57 {beats}/min
P Axis: 43 degrees
P Axis: 49 degrees
P-R Interval: 158 ms
P-R Interval: 160 ms
Q-T Interval: 442 ms
Q-T Interval: 442 ms
QRS Duration: 74 ms
QRS Duration: 74 ms
QTc Calculation (Bazett): 419 ms
QTc Calculation (Bazett): 430 ms
R Axis: 45 degrees
R Axis: 49 degrees
T Axis: 24 degrees
T Axis: 26 degrees
Ventricular Rate: 54 {beats}/min
Ventricular Rate: 57 {beats}/min

## 2021-08-23 LAB — BASIC METABOLIC PANEL W/ REFLEX TO MG FOR LOW K
Anion Gap: 10 mmol/L (ref 9–17)
BUN: 24 mg/dL — ABNORMAL HIGH (ref 8–23)
Bun/Cre Ratio: 34 — ABNORMAL HIGH (ref 9–20)
CO2: 24 mmol/L (ref 20–31)
Calcium: 9.3 mg/dL (ref 8.6–10.4)
Chloride: 104 mmol/L (ref 98–107)
Creatinine: 0.7 mg/dL (ref 0.5–0.9)
Est, Glom Filt Rate: >60 mL/min/{1.73_m2} (ref >60)
Glucose: 98 mg/dL (ref 70–99)
Potassium: 4.2 mmol/L (ref 3.7–5.3)
Sodium: 138 mmol/L (ref 135–144)

## 2021-08-23 LAB — TROPONIN
Troponin, High Sensitivity: 22 ng/L — ABNORMAL HIGH (ref 0–14)
Troponin, High Sensitivity: 20 ng/L — ABNORMAL HIGH (ref 0–14)
Troponin, High Sensitivity: 25 ng/L — ABNORMAL HIGH (ref 0–14)

## 2021-08-23 MED ORDER — PANTOPRAZOLE SODIUM 40 MG PO TBEC
40 MG | Freq: Every day | ORAL | Status: DC
Start: 2021-08-23 — End: 2021-08-23
  Administered 2021-08-23: 10:00:00 40 mg via ORAL

## 2021-08-23 MED ORDER — SODIUM CHLORIDE 0.9 % IV SOLN
0.9 % | INTRAVENOUS | Status: DC | PRN
Start: 2021-08-23 — End: 2021-08-23

## 2021-08-23 MED ORDER — SODIUM CHLORIDE 0.9 % IN NEBU
0.9 % | RESPIRATORY_TRACT | Status: DC | PRN
Start: 2021-08-23 — End: 2021-08-23

## 2021-08-23 MED ORDER — HYDROCODONE-ACETAMINOPHEN 5-325 MG PO TABS
5-325 MG | ORAL_TABLET | Freq: Four times a day (QID) | ORAL | 0 refills | Status: DC | PRN
Start: 2021-08-23 — End: 2021-08-26

## 2021-08-23 MED ORDER — LISINOPRIL 10 MG PO TABS
10 | ORAL_TABLET | Freq: Every day | ORAL | 0 refills | Status: DC
Start: 2021-08-23 — End: 2021-08-30
  Filled 2021-08-23: qty 30, 30d supply, fill #0

## 2021-08-23 MED ORDER — DONEPEZIL HCL 5 MG PO TABS
5 MG | Freq: Every evening | ORAL | Status: DC
Start: 2021-08-23 — End: 2021-08-23

## 2021-08-23 MED ORDER — TRAMADOL HCL 50 MG PO TABS
50 MG | Freq: Three times a day (TID) | ORAL | Status: DC | PRN
Start: 2021-08-23 — End: 2021-08-23
  Administered 2021-08-23: 11:00:00 100 mg via ORAL

## 2021-08-23 MED ORDER — ASPIRIN 81 MG PO TBEC
81 MG | Freq: Every day | ORAL | Status: DC
Start: 2021-08-23 — End: 2021-08-23
  Administered 2021-08-23: 15:00:00 81 mg via ORAL

## 2021-08-23 MED ORDER — NORMAL SALINE FLUSH 0.9 % IV SOLN
0.9 % | Freq: Two times a day (BID) | INTRAVENOUS | Status: DC
Start: 2021-08-23 — End: 2021-08-23
  Administered 2021-08-23: 12:00:00 10 mL via INTRAVENOUS

## 2021-08-23 MED ORDER — TIOTROPIUM BROMIDE-OLODATEROL 2.5-2.5 MCG/ACT IN AERS
Freq: Every day | RESPIRATORY_TRACT | Status: DC
Start: 2021-08-23 — End: 2021-08-23

## 2021-08-23 MED ORDER — ACETAMINOPHEN 325 MG PO TABS
325 MG | Freq: Three times a day (TID) | ORAL | Status: DC | PRN
Start: 2021-08-23 — End: 2021-08-23

## 2021-08-23 MED ORDER — DONEPEZIL HCL 5 MG PO TABS
5 MG | Freq: Every evening | ORAL | Status: DC
Start: 2021-08-23 — End: 2021-08-23
  Administered 2021-08-23: 07:00:00 5 mg via ORAL

## 2021-08-23 MED ORDER — IOPAMIDOL 76 % IV SOLN
76 % | Freq: Once | INTRAVENOUS | Status: AC | PRN
Start: 2021-08-23 — End: 2021-08-22
  Administered 2021-08-23: 02:00:00 75 mL via INTRAVENOUS

## 2021-08-23 MED ORDER — NICARDIPINE HCL 2.5 MG/ML IV SOLN
2.5 MG/ML | INTRAVENOUS | Status: DC
Start: 2021-08-23 — End: 2021-08-23
  Administered 2021-08-23: 05:00:00 2.5 mg/h via INTRAVENOUS

## 2021-08-23 MED ORDER — ASPIRIN 81 MG PO TBEC
81 | ORAL_TABLET | Freq: Every day | ORAL | 0 refills | 30.00000 days | Status: DC
Start: 2021-08-23 — End: 2022-05-18

## 2021-08-23 MED ORDER — ACETAMINOPHEN 325 MG PO TABS
325 MG | Freq: Four times a day (QID) | ORAL | Status: DC | PRN
Start: 2021-08-23 — End: 2021-08-23

## 2021-08-23 MED ORDER — ALBUTEROL SULFATE (2.5 MG/3ML) 0.083% IN NEBU
RESPIRATORY_TRACT | Status: DC | PRN
Start: 2021-08-23 — End: 2021-08-23

## 2021-08-23 MED ORDER — POLYETHYLENE GLYCOL 3350 17 G PO PACK
17 g | Freq: Every day | ORAL | Status: DC | PRN
Start: 2021-08-23 — End: 2021-08-23

## 2021-08-23 MED ORDER — ACETAMINOPHEN 650 MG RE SUPP
650 MG | Freq: Four times a day (QID) | RECTAL | Status: DC | PRN
Start: 2021-08-23 — End: 2021-08-23

## 2021-08-23 MED ORDER — LISINOPRIL 5 MG PO TABS
5 MG | Freq: Once | ORAL | Status: AC
Start: 2021-08-23 — End: 2021-08-23
  Administered 2021-08-23: 15:00:00 5 mg via ORAL

## 2021-08-23 MED ORDER — IBUPROFEN 600 MG PO TABS
600 MG | Freq: Two times a day (BID) | ORAL | Status: DC | PRN
Start: 2021-08-23 — End: 2021-08-23

## 2021-08-23 MED ORDER — NICARDIPINE HCL 2.5 MG/ML IV SOLN
2.5 MG/ML | INTRAVENOUS | Status: DC
Start: 2021-08-23 — End: 2021-08-23

## 2021-08-23 MED ORDER — ATORVASTATIN CALCIUM 10 MG PO TABS
10 MG | Freq: Every day | ORAL | Status: DC
Start: 2021-08-23 — End: 2021-08-23
  Administered 2021-08-23: 12:00:00 20 mg via ORAL

## 2021-08-23 MED ORDER — POLYETHYLENE GLYCOL 3350 17 GM/SCOOP PO POWD
17 GM/SCOOP | Freq: Every day | ORAL | Status: DC
Start: 2021-08-23 — End: 2021-08-23
  Administered 2021-08-23: 14:00:00 17 g via ORAL

## 2021-08-23 MED ORDER — LACTULOSE 10 GM/15ML PO SOLN
10 | Freq: Every evening | ORAL | 0 refills | 30.00000 days | Status: DC
Start: 2021-08-23 — End: 2021-12-14
  Filled 2021-08-23: qty 473, 31d supply, fill #0

## 2021-08-23 MED ORDER — NORMAL SALINE FLUSH 0.9 % IV SOLN
0.9 % | INTRAVENOUS | Status: DC | PRN
Start: 2021-08-23 — End: 2021-08-23

## 2021-08-23 MED ORDER — LISINOPRIL 5 MG PO TABS
5 MG | Freq: Every day | ORAL | Status: AC
Start: 2021-08-23 — End: 2021-08-23
  Administered 2021-08-23 (×2): 5 mg via ORAL

## 2021-08-23 MED ORDER — LISINOPRIL 5 MG PO TABS
5 MG | Freq: Every day | ORAL | Status: DC
Start: 2021-08-23 — End: 2021-08-23

## 2021-08-23 MED ORDER — NYSTATIN 100000 UNIT/ML MT SUSP
100000 | Freq: Four times a day (QID) | OROMUCOSAL | 0 refills | 6.00000 days | Status: AC
Start: 2021-08-23 — End: 2021-09-02
  Filled 2021-08-23: qty 200, 10d supply, fill #0

## 2021-08-23 MED ORDER — CITALOPRAM HYDROBROMIDE 20 MG PO TABS
20 MG | Freq: Every day | ORAL | Status: DC
Start: 2021-08-23 — End: 2021-08-23
  Administered 2021-08-23: 12:00:00 20 mg via ORAL

## 2021-08-23 MED ORDER — ONDANSETRON HCL 4 MG/2ML IJ SOLN
4 MG/2ML | Freq: Four times a day (QID) | INTRAMUSCULAR | Status: DC | PRN
Start: 2021-08-23 — End: 2021-08-23

## 2021-08-23 MED ORDER — ACETAMINOPHEN ER 650 MG PO TBCR
650 MG | Freq: Three times a day (TID) | ORAL | Status: DC | PRN
Start: 2021-08-23 — End: 2021-08-23
  Administered 2021-08-23: 11:00:00 1300 mg via ORAL

## 2021-08-23 MED ORDER — LORAZEPAM 0.5 MG PO TABS
0.5 MG | Freq: Every evening | ORAL | Status: DC | PRN
Start: 2021-08-23 — End: 2021-08-23
  Administered 2021-08-23: 07:00:00 0.5 mg via ORAL

## 2021-08-23 MED ORDER — ONDANSETRON 4 MG PO TBDP
4 MG | Freq: Three times a day (TID) | ORAL | Status: DC | PRN
Start: 2021-08-23 — End: 2021-08-23

## 2021-08-23 MED ORDER — ALBUTEROL SULFATE HFA 108 (90 BASE) MCG/ACT IN AERS
108 (90 Base) MCG/ACT | Freq: Four times a day (QID) | RESPIRATORY_TRACT | Status: DC | PRN
Start: 2021-08-23 — End: 2021-08-23

## 2021-08-23 MED ORDER — LISINOPRIL 5 MG PO TABS
5 MG | Freq: Every day | ORAL | Status: DC
Start: 2021-08-23 — End: 2021-08-22

## 2021-08-23 MED ORDER — LATANOPROST 0.005 % OP SOLN
0.005 % | Freq: Every evening | OPHTHALMIC | Status: DC
Start: 2021-08-23 — End: 2021-08-23

## 2021-08-23 MED ORDER — HYDRALAZINE HCL 20 MG/ML IJ SOLN
20 MG/ML | Freq: Once | INTRAMUSCULAR | Status: AC
Start: 2021-08-23 — End: 2021-08-22
  Administered 2021-08-23: 03:00:00 10 mg via INTRAVENOUS

## 2021-08-23 MED FILL — STIOLTO RESPIMAT 2.5-2.5 MCG/ACT IN AERS: RESPIRATORY_TRACT | Qty: 1

## 2021-08-23 MED FILL — HYDRALAZINE HCL 20 MG/ML IJ SOLN: 20 MG/ML | INTRAMUSCULAR | Qty: 1

## 2021-08-23 MED FILL — POLYETHYLENE GLYCOL 3350 17 GM/SCOOP PO POWD: 17 GM/SCOOP | ORAL | Qty: 238

## 2021-08-23 MED FILL — PANTOPRAZOLE SODIUM 40 MG PO TBEC: 40 MG | ORAL | Qty: 1

## 2021-08-23 MED FILL — ATORVASTATIN CALCIUM 10 MG PO TABS: 10 MG | ORAL | Qty: 2

## 2021-08-23 MED FILL — CITALOPRAM HYDROBROMIDE 20 MG PO TABS: 20 MG | ORAL | Qty: 1

## 2021-08-23 MED FILL — ASPIRIN LOW DOSE 81 MG PO TBEC: 81 MG | ORAL | Qty: 1

## 2021-08-23 MED FILL — TRAMADOL HCL 50 MG PO TABS: 50 MG | ORAL | Qty: 2

## 2021-08-23 MED FILL — LORAZEPAM 0.5 MG PO TABS: 0.5 MG | ORAL | Qty: 1

## 2021-08-23 MED FILL — DONEPEZIL HCL 5 MG PO TABS: 5 MG | ORAL | Qty: 1

## 2021-08-23 MED FILL — NICARDIPINE HCL 2.5 MG/ML IV SOLN: 2.5 MG/ML | INTRAVENOUS | Qty: 10

## 2021-08-23 MED FILL — LISINOPRIL 5 MG PO TABS: 5 MG | ORAL | Qty: 1

## 2021-08-23 MED FILL — HYDROCODONE-ACETAMINOPHE 5-325 TABS: 5-325 5-325 MG | ORAL | 7 days supply | Qty: 10 | Fill #0 | Status: AC

## 2021-08-23 NOTE — Discharge Summary (Signed)
 [facility]                 [address] [place], [place] [address]                               DISCHARGE SUMMARY    PATIENT NAME: Katie Juarez, Katie Juarez                   DOB:        16-Dec-1940  MED REC NO:   [id number]                             ROOM:       0205  ACCOUNT NO:   [id number]                           ADMIT DATE: 08/22/2021  PROVIDER:     Beulah Gandy. Anise Salvo, MD                  DISCHARGE DATE:  08/23/2021    ATTENDING PHYSICIAN OF HOSPITALIZATION:  Lamount Cohen, MD    PERSONAL PHYSICIAN:  Belinda Fisher, DO, [facility], [facility].    DIAGNOSES:  1.  EKG 08/22/2021, sinus bradycardia; monitor on 08/23/2021, sinus  bradycardia, occasional rate in the 40s.  CTA head, neck 08/23/2021, no  acute changes.  A 2D echo on 08/23/2021, mild LVH, normal left  ventricular systolic function, normal right ventricular systolic  function, SAV, AV valve cusp, mild AR, MAC, mild MR, mild TR, RVSP 41  mmHg, mildly elevated, mild pulmonary hypertension, normal AR, normal  IVC and inspiratory collapse, diastolic dysfunction present, LVEF 60% to  65%.  Compared to the prior 2D echo on 12/16/2020, mild MR, TR, RVSP 37  mmHg, grade 2 moderate diastolic dysfunction, LVEF 65%.  Troponin 25,  20, 22, 23, MI ruled out on 08/23/2021.  Bradycardia, intermittent.  2.  Right sciatica, falls at home.  3.  Visual changes, recent glaucoma.  4.  Dementia, Alzheimer's type.  5.  Benign tremor, chronic.  6.  Coronary artery disease.  Cardiac catheterization, 12/21/2020,  minimal coronary artery disease.  Cardiac catheterization, angioplasty,  2009.  7.  Peripheral vascular disease.  8.  Chronic kidney disease, stage II.  9.  Arrhythmia.  10.  COPD, emphysema.  11.  Asthma.  12.  GERD.  13.  Chronic pain syndrome with planned pain management at [facility] on 09/02/2021.  14.  Variously degenerative joint disease, lumbar facet syndrome, LS  disk displacement.  L1 burst fracture,  spondylolisthesis multiple sites,  SIJ pain bilaterally, left piriformis syndrome, lumbar radicular pain,  chronic low back pain, lumbar spinal stenosis.  15.  Depression, anxiety.  16.  Tobacco abuse, quit on 01/11/1988.  17.  Hearing impairment.  18.  Oral pain, thrush.  Treated with nystatin swish and swallow.    Other medical problems set forth in the progress note of 08/23/2021.    HISTORY OF PRESENT ILLNESS AND HOSPITAL COURSE:  The patient is an  81 year old white female presented with complaints of dizziness,  fatigue, and lightheadedness.  Blood pressure initially 209/75.  Placed  on a Cardene drip, subsequently discontinued, and pressures began to  drop.  The patient was no longer on Cardene and was seen by Cardiology.  Adjustment made in the patient's medications and able to be discharged  to home on 08/23/2021.  The patient is on lisinopril 10 mg p.o. daily by  Cardiology.  Aspirin was added to her regimen.    The patient's major complaints, however, tended to be more regarding her  back pain.  She had been on tramadol, apparently had not been tolerating  the medication well.  This was discontinued.  Given she has a history of  constipation, the patient was placed on lactulose for home in addition  to her other regimen for constipation.  The patient is to be seen by  pain management on 09/02/2021 in the outpatient setting.    The patient is able to be discharged to home on 08/23/2021.    LABORATORY DATA:  Around the time of discharge, white cell count 13.8,  hemoglobin 13.3, hematocrit 40.0, platelets 210,000.  Sodium 138,  potassium 4.2, chloride 104, CO2 24, BUN 24, creatinine 0.7, glucose 98,  calcium 9.3, GFR greater than 60.  Troponin 25, 20, 22, 23.    DISCHARGE INSTRUCTIONS/FOLLOWUP:  Discharged to home on 09/02/2021.    DIET:  Cardiac.    ACTIVITY:  As tolerated.    CONDITION AT DISCHARGE:  Fair, improved.    MEDICATIONS, NEW:  Aspirin 81 mg p.o. daily; hydrocodone/APAP (Norco)  5/325 one p.o.  q.6 hours p.r.n. pain; lactulose (Chronulac) 15 mL p.o.  q.p.m.; lisinopril 10 mg p.o. daily; nystatin (Mycostatin) 5 mL four  times daily for oral thrush.    CONTINUE THE FOLLOWING:  Alendronate (Fosamax) 70 mg p.o. every 7 days;  Anoro Ellipta 62.5/25 (umeclidinium/vilanterol) one puff daily;  atorvastatin (Lipitor) 20 mg p.o. daily; calcium carbonate with vitamin  D 600 mg/800 mg one p.o. twice daily; citalopram (Celexa) 20 mg p.o.  daily; cranberry OTC, on the patient's volition one daily; donepezil  (Aricept) 5 mg p.o. nightly; EQ Arthritis (acetaminophen) 2 tablets  every 8 hours p.r.n. pain, fever, not to exceed 4 gm and preferably less  than 3 gm of acetaminophen in a 24-hour period; for glaucoma,  latanoprost (Xalatan) 0.05% ophthalmic solution as directed; lorazepam  (Ativan) 0.5 mg p.o. daily; nitroglycerin 0.4 mg sublingual q.5 minutes  x3 p.r.n. chest pain, if no relief after dose #1, call 911; omeprazole  (Prilosec) 40 mg p.o. daily; GlycoLax 17 gm in 8 ounces of water daily  as needed; Hypotears (polyvinyl alcohol-povidone) 1.5-0.6% one to two  drops both eyes as needed for dry eyes; probiotic acidophilus one daily,  therapeutic multivitamin minerals one p.o. daily, vitamin D3 50 mcg  (2000 units) p.o. daily.    FOLLOWING MEDICATIONS DISCONTINUED:  Albuterol sulfate HFA, ibuprofen,  and tramadol.    Follow up with the patient's personal physician, Belinda Fisher, DO,  [facility], [facility].  Follow up with Pain Management.  Follow up with [facility], [facility]  [facility].    Any aspect of the patient's care not discussed in the chart and/or  dictation will be addressed and treated as an outpatient.    The patient's medications have been reviewed including, but not limited  to, pre-hospital, hospital and discharge medications.  The patient  and/or the patient's personal representatives were specifically advised  the only medications to be taken are those set  forth in the discharge  orders and no other medications should be taken.  Any prior medications  not on the discharge orders are specifically discontinued.        Diona Browner, MD  D: 08/23/2021 18:56:13       T: 08/23/2021 19:01:39     [email or web address]  Job#: [id number]     Doc#: [id number]    CC:

## 2021-08-23 NOTE — Plan of Care (Signed)
Problem: Discharge Planning  Goal: Discharge to home or other facility with appropriate resources  Outcome: Progressing  Flowsheets (Taken 08/23/2021 0225 by Raelyn Mora, RN)  Discharge to home or other facility with appropriate resources:   Identify barriers to discharge with patient and caregiver   Arrange for needed discharge resources and transportation as appropriate   Identify discharge learning needs (meds, wound care, etc)   Refer to discharge planning if patient needs post-hospital services based on physician order or complex needs related to functional status, cognitive ability or social support system     Problem: Pain  Goal: Verbalizes/displays adequate comfort level or baseline comfort level  Outcome: Progressing     Problem: ABCDS Injury Assessment  Goal: Absence of physical injury  Outcome: Progressing     Problem: Safety - Adult  Goal: Free from fall injury  Outcome: Progressing  Flowsheets (Taken 08/23/2021 0917)  Free From Fall Injury: Instruct family/caregiver on patient safety  Note: Patient has remained free from falls, call light within reach, bed in low  and locked position.     Problem: Cardiovascular - Adult  Goal: Maintains optimal cardiac output and hemodynamic stability  Outcome: Progressing     Problem: Skin/Tissue Integrity - Adult  Goal: Skin integrity remains intact  Outcome: Progressing

## 2021-08-23 NOTE — Plan of Care (Signed)
Problem: Discharge Planning  Goal: Discharge to home or other facility with appropriate resources  08/23/2021 1248 by Consuello Closs, RN  Outcome: Adequate for Discharge     Problem: Pain  Goal: Verbalizes/displays adequate comfort level or baseline comfort level  08/23/2021 1248 by Consuello Closs, RN  Outcome: Adequate for Discharge     Problem: ABCDS Injury Assessment  Goal: Absence of physical injury  08/23/2021 1248 by Consuello Closs, RN  Outcome: Adequate for Discharge     Problem: Safety - Adult  Goal: Free from fall injury  08/23/2021 0917 by Consuello Closs, RN  Outcome: Progressing  Flowsheets (Taken 08/23/2021 0917)  Free From Fall Injury: Instruct family/caregiver on patient safety  Note: Patient has remained free from falls, call light within reach, bed in low  and locked position.     Problem: Safety - Adult  Goal: Free from fall injury  08/23/2021 1248 by Consuello Closs, RN  Outcome: Adequate for Discharge     Problem: Cardiovascular - Adult  Goal: Maintains optimal cardiac output and hemodynamic stability  08/23/2021 1248 by Consuello Closs, RN  Outcome: Adequate for Discharge     Problem: Cardiovascular - Adult  Goal: Maintains optimal cardiac output and hemodynamic stability  08/23/2021 1248 by Consuello Closs, RN  Outcome: Adequate for Discharge     Problem: Skin/Tissue Integrity - Adult  Goal: Skin integrity remains intact  08/23/2021 1248 by Consuello Closs, RN  Outcome: Adequate for Discharge

## 2021-08-23 NOTE — H&P (Signed)
HOSPITALIST ADMISSION H&P    REASON FOR ADMISSION:  hypertensive urgency  ESTIMATED LENGTH OF STAY:>2 midnights, 3-4 days    ATTENDING/ADMITTING PHYSICIAN: Philmore Pali, MD    HISTORY OF PRESENT ILLNESS:      The patient is a 81 y.o. female patient of [name], DO who presents from the ER with vague c/o not feeling well, fatigued, dizzy, and lightheaded. She feels "unsteady" when she walks. She denies chest pain, acute shortness of breath, nausea, vomiting, or headache. Per EMR review, she does have a history of dizziness and states this feels similar. She states her vision seems blurry -- she was seen in urgent care for this on 06/24/2021 and followed up with her eye doctor who recently diagnosed her with glaucoma and prescribed her latanoprost drops. She has right sided sciatica that is being worked up outpatient and she is establishing with pain management on 09/02/2021. She has had a couple falls the weekend of July 29th. She denies history of hypertension. In the past, she has had low blood pressure at times.    In ER, she was hypertensive with blood pressure 209/75 and 198/86. She was also intermittently bradycardic with heart rate in the 40's at times. CTA of the head and neck showed no acute process. EKG showed sinus bradycardia, troponin 23 -->22. In ER, she received her home pain medications (tramadol and tylenol), 5 mg PO lisinopril, 10 mg IV hydralazine, and was then placed on a cardene IV gtt.     Last 2D echo 12/16/2020 showed EF 65%, Gr2DD, RVSP 37, mild MR, mild TR. Cardiac cath 12/2020 showed minimal CAD.     She also c/o dry mouth and urinary frequency. She believes she may have thrush. She has been on prednisone lately due to her sciatica pain. She has been sipping on fluids often due to her mouth feeling dry, which in turn is causing her to urinate more often. HgbA1C 5.8, urinalysis without signs of infection.     COPD -- stable  Hyperlipidemia -- stopped taking lipitor in early June due to  joint aches, but has not noticed any improvement off this medication  Alzheimer's dementia  Benign essential tremor  Anxiety  Chronic pain syndrome -- currently taking tramadol, tylenol, and ibuprofen     See below for additional PMH.    Patient seen-questioned-examined-available records reviewed, including, but not limited to ER records, imaging results, lab results, office records, personal records, and OARRS -- no signs of abuse or diversion.     Past Medical History:   Diagnosis Date    Basal cell carcinoma (BCC) of face 10/2018    excised by Dr Yehuda Savannah     Breast cancer [facility]) 1999    in remission    Constipation 12/27/2018    COPD (chronic obstructive pulmonary disease) ([place])     COVID-19 11/06/2020    DDD (degenerative disc disease)     Diverticulitis 06/28/2018    Ileus ([place]) 08/07/2021    Large bowel obstruction ([place]) 02/2015    [facility]    LBP (low back pain)     Pharyngoesophageal dysphagia     SS (spinal stenosis)            Past Surgical History:   Procedure Laterality Date    [place]    BREAST LUMPECTOMY Right 1999    CARDIAC CATHETERIZATION  12/21/2020    Dr Lowell Bouton    CHOLECYSTECTOMY  1990  EXCISION/BIOPSY Left 11/01/2018    Dr Yehuda Savannah / [name], for [place] Left 02/20/2015    strangulated with small bowel resection - [facility],  [facility], [place]  1980    Done for endometriosis; done in [place]  01/03/2019    VERTEBRAL AUMENTATION  L1      OTHER SURGICAL HISTORY  11/14/13, 06/23/11, 06/30/11, 07/12/11 12/29/11    L4/L5 IESI    OTHER SURGICAL HISTORY  12/17/2013    L5/S1 IESI    OTHER SURGICAL HISTORY Bilateral 05/30/2014    SIJ and piriformis    OTHER SURGICAL HISTORY Bilateral 09/16/2014    L3, L4, L5 Diagnostic Medial Branch Block    OTHER SURGICAL HISTORY Bilateral 09/26/2014    L5 TFE    OTHER SURGICAL HISTORY  10/10/2014    bil L5 TFE    OTHER SURGICAL  HISTORY  10/31/2014    caudal    SMALL INTESTINE SURGERY  02/20/2015    strangulated left femoral hernia    SPINE SURGERY N/A 01/03/2019    VERTEBRAL AUMENTATION  L1  (C-ARM X2, performed by Loura Back, [place] at [place] 04/24/2019    EGD BIOPSY performed by Monna Fam, MD at [facility] OR       Allergies:    Morphine, Pcn [penicillins], Zithromax [azithromycin], and Adhesive tape    Social History:    reports that she quit smoking about 33 years ago. Her smoking use included cigarettes. She started smoking about 68 years ago. She has a 40.00 pack-year smoking history. She has never used smokeless tobacco. She reports that she does not drink alcohol and does not use drugs.    Family History:   family history includes Alzheimer's Disease in her sister; Cancer in her brother and sister; Dementia in her sister; Diabetes in her maternal grandfather; Lupus in her sister; Mult Sclerosis in her daughter and nephew; Other in her brother, brother, and mother; Stroke in her father.    REVIEW OF SYSTEMS:  See HPI and problem list; otherwise no other new complaints with respect to eyes, ENT, neck, pulmonary, coronary, chest, GI, GU, endocrine, musculoskeletal, hematologic, lymphatic, allergic/immunologic, neurologic, psychiatric, or skin.     Code status: patient wishes for DNR-CCA at this time.    PHYSICAL EXAM:  Vitals:  BP 136/64   Pulse 68   Temp 97.6 F (36.4 C) (Tympanic)   Resp 22   Ht _0  (1.626 m)   Wt 140 lb 6.4 oz (63.7 kg)   LMP  (LMP Unknown)   SpO2 91%   BMI 24.10 kg/m     General: awake, alert, cooperative, well groomed, and pleasant  HEENT:  tongue with thick yellow coating that does not scrape of with tongue depressor, PERRLA, EOMI, External nose normal, Normocephalic, Atraumatic, and Neck with full ROM  Neck: Supple, Carotid Pulses Present, and No Bruits  Chest/Lungs: Clear to Auscultation without Rales, Rhonchi, or Wheezes and  Respirations even and unlabored  Cardiac: Regular Rate and Rhythm and Pedal Pulses Palpable Bilaterally  GI/Abdomen: Bowel Sounds Present and Soft, Non-tender, without Guarding or Rebound Tenderness  GU: Not examined  Extremities/Musculoskeletal: All four extremities without edema and Generalized weakness  Skin: No Cyanosis and Skin warm and dry  Neuro:  slightly hard of hearing, gait impairment at baseline (uses walker), Dementia  at baseline, Alert and Oriented, to Person, to Time, to Place, to Situation, and No Localizing Signs/Symptoms  Psychiatric:  anxious at times and flat affect      LABS:    CBC with Differential:    Lab Results   Component Value Date/Time    WBC 12.7 08/22/2021 06:30 PM    RBC 4.38 08/22/2021 06:30 PM    RBC 4.29 08/07/2021 01:10 PM    HGB 14.1 08/22/2021 06:30 PM    HCT 42.3 08/22/2021 06:30 PM    PLT 217 08/22/2021 06:30 PM    MCV 96.6 08/22/2021 06:30 PM    MCH 32.2 08/22/2021 06:30 PM    MCHC 33.3 08/22/2021 06:30 PM    RDW 14.6 08/22/2021 06:30 PM    LYMPHOPCT 14 08/22/2021 06:30 PM    LYMPHOPCT 17.95 08/07/2021 01:10 PM    MONOPCT 8 08/22/2021 06:30 PM    BASOPCT 1 08/22/2021 06:30 PM    MONOSABS 1.02 08/22/2021 06:30 PM    LYMPHSABS 1.72 08/22/2021 06:30 PM    EOSABS 0.14 08/22/2021 06:30 PM    BASOSABS 0.09 08/22/2021 06:30 PM    DIFFTYPE NOT REPORTED 05/20/2019 03:22 PM     CMP:    Lab Results   Component Value Date/Time    NA 139 08/22/2021 06:30 PM    K 4.1 08/22/2021 06:30 PM    CL 103 08/22/2021 06:30 PM    CO2 24 08/22/2021 06:30 PM    BUN 28 08/22/2021 06:30 PM    CREATININE 0.9 08/22/2021 06:30 PM    GFRAA >60 06/15/2020 12:01 PM    AGRATIO 1.4 09/27/2017 08:50 AM    LABGLOM >60 08/22/2021 06:30 PM    GLUCOSE 119 08/22/2021 06:30 PM    GLUCOSE 88 08/07/2021 01:10 PM    PROT 7.2 08/22/2021 06:30 PM    LABALBU 4.7 08/22/2021 06:30 PM    LABALBU 4.1 08/07/2021 01:10 PM    CALCIUM 9.6 08/22/2021 06:30 PM    BILITOT 0.4 08/22/2021 06:30 PM    ALKPHOS 46 08/22/2021 06:30 PM    AST  13 08/22/2021 06:30 PM    ALT 11 08/22/2021 06:30 PM       ASSESSMENT:      Patient Active Problem List   Diagnosis    Lumbar facet joint syndrome (HCC)    Displacement of intervertebral disc of lumbosacral region    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    Pain of both sacroiliac joints    Piriformis syndrome of left side    Lumbar radicular pain    Neural foraminal stenosis of lumbar spine    Chronic low back pain    Spinal stenosis, lumbar    Acquired absence of other specified parts of digestive tract    Arthrodesis status    Asymptomatic menopausal state    Extravasation of urine    Gastro-esophageal reflux disease without esophagitis    Hyperlipidemia, unspecified    Closed compression fracture of body of L1 vertebra (HCC)    Lumbar burst fracture, open, initial encounter (HCC)    Pharyngoesophageal dysphagia    Anxiety    Benign essential tremor    Dizziness    Memory problem    Balance problems    Chronic cerebral ischemia    Neck stiffness    Tingling sensation    Left arm weakness    Dysphagia    Brain dysfunction    Carotid stenosis, asymptomatic, bilateral    Knee pain    Fatigue  Osteoarthrosis    Alzheimer's disease, unspecified    Chronic obstructive pulmonary disease, unspecified    Hypertensive urgency    Bradycardia    Frequent falls    Thrush, oral       PLAN:    Medications:  Scheduled Meds:   polyethylene glycol  17 g Oral Daily    tiotropium-olodaterol  2 puff Inhalation Daily    pantoprazole  40 mg Oral QAM AC    citalopram  20 mg Oral Daily    atorvastatin  20 mg Oral Daily    sodium chloride flush  5-40 mL IntraVENous 2 times per day    donepezil  5 mg Oral Nightly    latanoprost  1 drop Both Eyes Nightly    lisinopril  5 mg Oral Daily     Continuous Infusions:   niCARdipine 2.5 mg/hr (08/23/21 0303)    sodium chloride       PRN Meds:    Patient seen-questioned-examined-available records reviewed, including, but not limited to, ER reports--labs--imaging---office records---personal  notes    [place], Gimena  V  74 WF  [kk Black, FP;  ja [place], [place]; DC Cardiology--[facility]]  FULL CODE         CARDENE gtt--dc'd    Anti-infectives:  nystatin swish-swallow    Hypertensive urgency---8.14.2023---fatigue--dizzy--lightheaded---209/75          EKG---8.13.2023---sinus bradycardia        Monitor---8.14.2023---sinus bradycardia--occasional 40s        CTA head--neck--8.14.2023---[-]       2D ECHO---8.14.2023--mild LVH--NLVSF--NRVSF---SAV AV                          valve cusp--mild AR--MAC--mild MR--mild TR----RVSP ~ 41 mm Hg--                         mildly elevated--mild pulmonary HTN-normal AR--normal IVC and                         collapse--DD--LVEF ~ 60-65%         2D ECHO---12.7.2022--mild MR-TR--RVSP ~ 37 mm Hg--Grade II moderate DD--LVEF ~ 65%                Troponin--25--20--22--23---MI ruled out---8.14.2023  Bradycardia--intermittent  Right sciatica  Falls at home   Visual changes--recent glaucoma  Dementia---Alzheimer's type   B9 essential tremor---chronic            ASCVD         Cardiac catheterization--12.12.2022--minimal CAD         EKG----8.18.2020----NSR--70--NACs         Cardiac catheterization----angioplasty--2009  Peripheral vascular disease  CKD---Stage 2  Arrhythmia   COPD---emphysema   Asthma   GERD  Chronic pain syndrome---planned pain management [facility]---8.24.2023        DDD        Lumbar facet syndrome        LS disc displacement        L1 burst fracture        Spondylolisthesis multiple sites spine        SIJ pain---bilaterally        Left piriformis syndrome        Lumbar radicular pain        Chronic low back pain        Lumber spinal stenosis  Depression---anxiety   Tobacco abuse----1.1.1955---1 PPD x 35 years---quit--1.1.1990  HEARING IMPAIRMENT  Oral pain--thrush  PMH:  anemia, bronchitis, OA, breast carcinoma---1999--remission, cataracts, large               bowel obstruction---2017, hematuria, recurrent UTIs, diverticulitis--sigmoid---               possible abscess  formation---6.18.2020,  hand and jaw tremor , Covid--19                infection---2022, ileus--7.29.2023, pharyngoesophageal dysmotility, headache      PSH:    hysterectomy--cervix unknown--1980---endometriosis, bladder suspension--AP repair---1990,               cholecystectomy---1990, right breast lumpectomy--1999, tonsillectomy---1946, bacteremia,              appendectomy---1956, multiple bilateral pain management injection--variously L5-S1--              SIJ--piriformis--L3-4-5--caudal, cystoscopy--2008--laterally displace right UO--              likely right reflux, cataract extraction--IOL--OU, hernia repair with bowel obstruction--strangulated              left femoral hernia--2017, BCC--face--2020, vertebral augmentation---L1--2020, EGD---2021    Allergies:       penicillin---hives, Zithromax--azithromycin---hives, adhe  Intolerances:  morphine--night sweats--"loopy"    Attending Supervising Physician's Attestation Statement  I performed a history and physical examination on the patient and discussed the management with the nurse practitioner. I reviewed and agree with the findings and plan as documented in her note .    Electronically signed by Janene Harvey, MD on 08/23/21 at 6:17 PM EDT     PLAN:    1. Hypertensive urgency and intermittent bradycardia -- telemetry monitoring, serial troponins, con't cardene IV gtt to keep sbp <170 and plan for gradual reduction in blood pressure, neuro checks, 2D echo, cardiology consult, chest xray, cardiac diet, check lipid panel, low dose lisinopril initiated  2. Oral thrush -- nystatin swish and swallow  3. COPD -- stable -- home inhaler, RT protocols  4. Fall precautions, PT/OT eval and treat  5. Home medications reviewed  6. DVT prophylaxis -- SCDs until blood pressure well controlled  7. See orders     Note that over 55 minutes was spent in reviewing and obtaining history, physical examination and evaluation of the patient, placing orders, providing education,  coordinating care, reviewing test results, documenting in the medical record, and discussion/communicating with patient/caregiver/family.    Electronically signed by Katrine Coho, APRN - CNP, NP-C, FNP-BC on 08/23/2021 at 4:14 AM  Hospitalist/Nocturnist    Add:   Home---8.14.2023            Medications reviewed            Stop tramadol--intolerance?            Norco             Lactulose to avoid constipation--cont'd Miralax            Decreased lisinopril to 20 daily to avoid hypotension            Thrush---nystatin             Follow up Dr. Renard Hamper, FP             Follow up pain management--8.24.2023--[place]             See orders

## 2021-08-23 NOTE — Consults (Signed)
[facility]  In Patient Cardiology Consult             Date:   08/23/2021  Patient name: Katie Juarez  Date of admission:  08/22/2021  6:11 PM  MRN:   [id number]  Date of Birth:  1940-08-22    Reason for Admission: Uncontrolled hypertension    CHIEF COMPLAINT: Uncontrolled hypertension    History Obtained From: The patient and chart    HISTORY OF PRESENT ILLNESS:    This is a 81 year old female.  She states that at home she was shaky.  She felt weak.  She denied any chest pain, shortness of breath, orthopnea, PND, palpitations.  She came into the emergency room.  Her blood pressure was markedly elevated.  She was diagnosed with hypertensive urgency/emergency.  She was initially on a Cardene drip.  Lisinopril was started.  Currently her blood pressure is controlled in the 130s without any drips.  She did have a pretty significant work-up in the end of 2022 after she was complaining of some chest pain and was seen in our office.    Past Medical History:    Past Medical History:   Diagnosis Date    Basal cell carcinoma (BCC) of face 10/2018    excised by Dr Yehuda Savannah     Breast cancer [facility]) 1999    in remission    Constipation 12/27/2018    COPD (chronic obstructive pulmonary disease) ([place])     COVID-19 11/06/2020    DDD (degenerative disc disease)     Diverticulitis 06/28/2018    Ileus ([place]) 08/07/2021    Large bowel obstruction ([place]) 02/2015    [facility]    LBP (low back pain)     Pharyngoesophageal dysphagia     SS (spinal stenosis)          Past Surgical History:    Past Surgical History:   Procedure Laterality Date    [place]    BREAST LUMPECTOMY Right 1999    CARDIAC CATHETERIZATION  12/21/2020    Dr Lowell Bouton    CHOLECYSTECTOMY  1990    EXCISION/BIOPSY Left 11/01/2018    Dr Yehuda Savannah / [name], for [place] Left 02/20/2015    strangulated with small bowel resection - [facility],  [facility] [place]  1980    Done for endometriosis; done in [place]  01/03/2019    VERTEBRAL AUMENTATION  L1      OTHER SURGICAL HISTORY  11/14/13, 06/23/11, 06/30/11, 07/12/11 12/29/11    L4/L5 IESI    OTHER SURGICAL HISTORY  12/17/2013    L5/S1 IESI    OTHER SURGICAL HISTORY Bilateral 05/30/2014    SIJ and piriformis    OTHER SURGICAL HISTORY Bilateral 09/16/2014    L3, L4, L5 Diagnostic Medial Branch Block    OTHER SURGICAL HISTORY Bilateral 09/26/2014    L5 TFE    OTHER SURGICAL HISTORY  10/10/2014    bil L5 TFE    OTHER SURGICAL HISTORY  10/31/2014    caudal    SMALL INTESTINE SURGERY  02/20/2015    strangulated left femoral hernia    SPINE SURGERY N/A 01/03/2019    VERTEBRAL AUMENTATION  L1  (C-ARM X2, performed by Loura Back, MD at [place] N/A 04/24/2019  EGD BIOPSY performed by Monna Fam, MD at [place] Medications:    No outpatient medications have been marked as taking for the 08/22/21 encounter [facility] Encounter).        Allergies:  Morphine, Pcn [penicillins], Zithromax [azithromycin], and Adhesive tape    Social History:    Social History     Socioeconomic History    Marital status: Widowed     Spouse name: None    Number of children: None    Years of education: None    Highest education level: None   Tobacco Use    Smoking status: Former     Packs/day: 1.00     Years: 40.00     Pack years: 40.00     Types: Cigarettes     Start date: 01/10/1953     Quit date: 01/11/1988     Years since quitting: 33.6    Smokeless tobacco: Never    Tobacco comments:     quit 25 years ago 1990   Vaping Use    Vaping Use: Never used   Substance and Sexual Activity    Alcohol use: No     Alcohol/week: 0.0 standard drinks    Drug use: Never    Sexual activity: Not Currently     Social Determinants of Health     Financial Resource Strain: Low Risk     Difficulty of Paying Living Expenses: Not hard at all   Food Insecurity: No  Food Insecurity    Worried About Charity fundraiser in the Last Year: Never true    Ran Out of Food in the Last Year: Never true   Transportation Needs: Unknown    Lack of Transportation (Non-Medical): No   Physical Activity: Insufficiently Active    Days of Exercise per Week: 1 day    Minutes of Exercise per Session: 60 min   Housing Stability: Unknown    Unstable Housing in the Last Year: No        Family History:   Family History   Problem Relation Age of Onset    Other Mother         ALS - diagnosed at age 97    Stroke Father         age 67    Cancer Sister         pt thinks it was liver    Cancer Brother         leukemia    Diabetes Maternal Grandfather     Other Brother         drowned    Other Brother         died at age 60; think due to spinal meningitis    Lupus Sister     Alzheimer's Disease Sister     Dementia Sister     Mult Sclerosis Nephew     Mult Sclerosis Daughter        REVIEW OF SYSTEMS:    Constitutional: there has been no unanticipated weight loss. There's been No change in energy level, No change in activity level.     Eyes: No visual changes or diplopia. No scleral icterus.  ENT: No Headaches, hearing loss or vertigo. No mouth sores or sore throat.  Cardiovascular: As HPI  Respiratory: As HPI  Gastrointestinal: No abdominal pain, appetite loss, blood in stools. No change in bowel or bladder habits.  Genitourinary: No dysuria, trouble voiding, or hematuria.  Musculoskeletal:  No gait disturbance, No weakness or joint complaints.  Integumentary: No rash or pruritis.  Neurological: No headache, diplopia, change in muscle strength, numbness or tingling. No change in gait, balance, coordination, mood, affect, memory, mentation, behavior.  Psychiatric: No anxiety, or depression.  Endocrine: No temperature intolerance. No excessive thirst, fluid intake, or urination. No tremor.  Hematologic/Lymphatic: No abnormal bruising or bleeding, blood clots or swollen lymph nodes.  Allergic/Immunologic: No  nasal congestion or hives.    PHYSICAL EXAM:    Physical Examination:    BP 138/72   Pulse 61   Temp 97.5 F (36.4 C) (Tympanic)   Resp 22   Ht '5\' 4"'$  (1.626 m)   Wt 140 lb 6.4 oz (63.7 kg)   LMP  (LMP Unknown)   SpO2 90%   BMI 24.10 kg/m    Constitutional and General Appearance: alert, cooperative, no distress and appears stated age  HEENT: PERRL, no cervical lymphadenopathy. No masses palpable. Normal oral mucosa  Respiratory:  Normal excursion and expansion without use of accessory muscles  Resp Auscultation: Good respiratory effort. No for increased work of breathing. On auscultation: clear  Cardiovascular:  Heart tones are crisp and normal. regular S1 and S2. Murmurs: none  Jugular venous pulsation Normal  Abdomen:  No masses or tenderness  Bowel sounds present  Extremities:   No Cyanosis or Clubbing   Lower extremity edema: none   Skin: Warm and dry  Neurological:  Alert and oriented.  Moves all extremities well  No abnormalities of mood, affect, memory, mentation, or behavior are noted    DATA:    Diagnostics:      EKG:   Results for orders placed or performed during the hospital encounter of 08/22/21   EKG 12 Lead   Result Value Ref Range    Ventricular Rate 54 BPM    Atrial Rate 54 BPM    P-R Interval 160 ms    QRS Duration 74 ms    Q-T Interval 442 ms    QTc Calculation (Bazett) 419 ms    P Axis 49 degrees    R Axis 49 degrees    T Axis 24 degrees    Narrative    Sinus bradycardia  Otherwise normal ECG       Echo:  Results for orders placed or performed during the hospital encounter of 12/16/20   ECHO Complete 2D W Doppler W Color   Result Value Ref Range    Left Ventricular Ejection Fraction 65     LVEF MODALITY ECHO     Narrative    Transthoracic Echocardiography Report (TTE)     Patient Name [place]     Date of Study               12/16/2020                Roselyn Reef      Date of      [birth year]  Gender                      Female   Birth      Age          89 year(s)  Race                         Caucasian      Room Number              Height:  64 inch, 162.56 cm      Corporate ID [id number]    Weight:                     134 pounds, 60.8 kg   #      Patient Acct [id number]   BSA:          1.65 m^2      BMI:     23 kg/m^2   #      MR #         [id number]     Alexis Goodell RDCS      Accession #  [id number]  Interpreting Physician      Daymon Larsen      Fellow                   Referring Nurse                            Practitioner      Interpreting             Referring Physician         [place]   Fellow     Type of Study      TTE procedure:2D Echocardiogram, M-Mode, Doppler, Color Doppler.     Procedure Date  Date: 12/16/2020 Start: 10:36 AM    Study Location: [facility]  Technical Quality: Good visualization    Indications:Chest pain and Shortness of breath.    History / Tech. Comments:  Procedure explained to patient.    Patient Status: Outpatient    Height: 64 inches Weight: 134 pounds BSA: 1.65 m^2 BMI: 23 kg/m^2    Rhythm: Within normal limits    CONCLUSIONS    Summary  Normal left ventricular diameter.  Borderline left ventricular hypertrophy.  Left ventricular systolic function is normal.  Left ventricular ejection fraction 65 %.  Grade II (moderate) left ventricular diastolic dysfunction.  Right atrial dilatation.  Aortic valve sclerosis without stenosis.  Trivial aortic regurgitation.  Normal mitral valve leaflets.  Mitral annular calcification.  Mild mitral regurgitation.  Normal tricuspid valve leaflets.  Mild tricuspid regurgitation.  Estimated right ventricular systolic pressure is 37 mmHg.  No significant pericardial effusion is seen.    Signature  ----------------------------------------------------------------------------   Electronically signed by Leonette Most RDCS(Sonographer) on 12/16/2020   11:07  AM  ----------------------------------------------------------------------------    ----------------------------------------------------------------------------   Electronically signed by Ali,Syed Sohail(Interpreting physician) on   12/16/2020 12:07 PM  ----------------------------------------------------------------------------  FINDINGS  Left Atrium  Left atrium is normal in size.  Left Ventricle  Normal left ventricular diameter.  Borderline left ventricular hypertrophy.  Left ventricular systolic function is normal.  Left ventricular ejection fraction 65 %.  Grade II (moderate) left ventricular diastolic dysfunction.  Right Atrium  Right atrial dilatation.  Right Ventricle  Normal right ventricular size and function.  Mitral Valve  Normal mitral valve leaflets.  Mitral annular calcification.  Mild mitral regurgitation.  Aortic Valve  Aortic valve sclerosis without stenosis.  Trivial aortic regurgitation.  Tricuspid Valve  Normal tricuspid valve leaflets.  Mild tricuspid regurgitation.  Estimated right ventricular systolic pressure is 37 mmHg.  Pulmonic Valve  The pulmonic valve is normal in structure.  Pericardial Effusion  No significant pericardial effusion is seen.    Miscellaneous  Normal aortic root dimension.  IVC normal diameter &  inspiratory collapse indicating normal RA filling  pressure .  E/E' average = 16.    M-mode / 2D Measurements & Calculations:      LVIDd:3.51 cm(3.7 - 5.6 cm)      Diastolic AOZHYQ:65.7 ml   LVIDs:2.22 cm(2.2 - 4.0 cm)      Systolic QIONGE:95.2 ml   IVSd:1.11 cm(0.6 - 1.1 cm)       Aortic Root:3.1 cm(2.0 - 3.7 cm)   LVPWd:1.09 cm(0.6 - 1.1 cm)      LA Dimension: 3.7 cm(1.9 - 4.0 cm)   Fractional Shortening:36.75 %    LA volume/Index: 36.6 ml /56m2   Calculated LVEF (%): 65.41 %      Mitral:                                 Aortic      Peak E-Wave: 0.96 m/s                   Peak Velocity: 1.98 m/s   Peak A-Wave: 1.32 m/s                   Peak Gradient: 15.68 mmHg   E/A Ratio:  0.73   Peak Gradient: 3.69 mmHg   Deceleration Time: 373 msec      Tricuspid:                              Pulmonic:      Peak TR Velocity: 2.93 m/s              Peak Velocity: 1.09 m/s   Peak TR Gradient: 34.3396 mmHg          Peak Gradient: 4.75 mmHg     Septal Wall E' velocity:0.05 m/s  Lateral Wall E' velocity:0.07 m/s     Nuclear Stress 11/22:  IMPRESSION:  1.  Moderate size/intensity mid anteroseptal ischemia.  2.  No infarct.  3.  Normal left ventricular ejection fraction 50% and normal wall  motion.    LAST CATH:   Results for orders placed or performed during the hospital encounter of 12/21/20   Diagnostic Cardiac Cath Lab Procedure    Narrative    Cardiac Diagnostic Report     Demographics      Patient    DSan SabaSAARIKA MOON     Date of Study           12/21/2020   Name      Date of    003/13/1942           Gender                  Female   Birth      Age        89year(s)            Race                    Caucasian      Room       [id number]^[id number]^AMEER   Number      Corporate  [id number]  ID #      Patient    [id number]  Acct #      MR #       [id number]  Performing Physician    [place]  [id number]            Referring Physician   #      Case ID #  [id number]                     Assisting Physician     Procedure  Procedure Type:     Diagnostic procedure: Lt Heart, Coronary Angio, LVgram     Complications:    - No complication    Indications:    - Atypical chest pain      - Dyspnea      - Abnormal nuclear perfusion test     Conclusions      Procedure Summary      Minimal CAD   D1: proximal 70% but small branch   Preserved LV systolic function      Signature      ----------------------------------------------------------------   Electronically signed by Kabour,Ameer(Performing Physician) on   12/21/2020 08:17   ----------------------------------------------------------------      Angiographic Findings      Cardiac Arteries and Lesion Findings     LMCA: Normal 0% stenosis.    LAD: Mid 20%  stenosis  D1: proximal 70% stenosis 9Small branch)    LCx: Normal 0% stenosis.    RCA: Mid area 25% stenosis     Coronary Tree      Dominance: Right     LV Analysis  LV function assessed VV:OHYWVP.  Ejection Fraction  +----------------------------------------------------------------------+---+  !Method                                                                !EF%!  +----------------------------------------------------------------------+---+  !LV gram                                                               !55 !  +----------------------------------------------------------------------+---+    Procedure Data  Procedure Start Time: 12/21/2020 07:56. Procedure End Time: 12/21/2020  08:13.    Diagnostic Cath Status: Elective    Entry Locations    - Retrograde Percutaneous access was performed through the Right Femoral      artery. A 6 Fr sheath was inserted. Hemostasis was successfully obtained      using 6 Fr. Angioseal.    Procedure Medications:    - Versed I.V. 2 mg.      - Fentanyl I.V. 50 mcg.      - Lidocaine HCl 1% '10mg'$ /ml S.Q. 2 ml.      - Lidocaine HCl 1% '10mg'$ /ml S.Q. 10 ml.    Catheters and Wires:    - 74F Catheter JL 4 was used for Left coronary angiography.      - 74F Catheter JR 4 was used for LV Pressure.      - 74F Catheter JR 4 was used for Left ventriculography.      - 74F Catheter JR 4 was used for Right coronary angiography.    Contrast Material:    - Isovue 37080 ml    Fluoroscopy Time:  Diagnostic: 2:12 minutes. Total: 2:12 minutes.    Estimated Blood Loss: 10 ml.     Medical History     Allergies    - Morphine.      - *Unlisted:(zithromax).      - Penicillin.      - Tape.     Risk Factors      The patient risk factors include:physical activity, treated   hypercholesterolemia, treated hypertension, family history of premature   CAD, chronic lung disease, last creatinine: 0.9 mg/dl and dyslipidemia.     Admission Data  Admission Date: 12/21/2020    Admission Status: Outpatient.        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     Hemodynamics      Condition: Baseline Room Air      Heart Rate: 54 bpm     Pressure  +-----+--------------------------------------------------------------------+  !Site !Pressure                                                            !  +-----+--------------------------------------------------------------------+  !AO   !160/87 (118)                                                        !  +-----+--------------------------------------------------------------------+  !LV   !148/3 ,14                                                           !  +-----+--------------------------------------------------------------------+    Shunts     Oxygen Values      O2 Capacity179.52            Labs:     CBC:   Recent Labs     08/22/21  1830 08/23/21  0840   WBC 12.7* 13.8*   HGB 14.1 13.3   HCT 42.3 40.0   PLT 217 210     BMP:   Recent Labs     08/22/21  1830 08/23/21  0840   NA 139 138   K 4.1 4.2   CO2 24 24   BUN 28* 24*   CREATININE 0.9 0.7   LABGLOM >60 >60   GLUCOSE 119* 98     BNP: No results for input(s): BNP in the last 72 hours.  PT/INR: No results for input(s): PROTIME, INR in the last 72 hours.  APTT:No results for input(s): APTT in the last 72 hours.  CARDIAC ENZYMES:  Recent Labs     08/22/21  2121 08/23/21  0304 08/23/21  0840   TROPHS 22* 20* 25*     FASTING LIPID PANEL:  Lab Results   Component Value Date/Time    HDL 47 04/08/2021 03:20 PM    LDLCALC 48.0 12/03/2018 09:10 AM    TRIG 375 04/08/2021 03:20 PM     LIVER PROFILE:  Recent Labs     08/22/21  1830   AST 13   ALT 11   LABALBU 4.7         IMPRESSION:      Hypertensive urgency/emergency-now controlled on oral lisinopril, initially was on a Cardene drip  Minimally elevated troponins due to the above  History of abnormal stress test that led to a cardiac catheterization which revealed 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    Thank you for allowing me to participate in the care of this patient, please do not hesitate to call if you have any questions.    Clearnce Hasten, DO, [place], [place], [place], [place] [facility]  [email or web address]  [phone]

## 2021-08-23 NOTE — Care Coordination-Inpatient (Signed)
Orders faxed     Electronically signed by [name], LSW on 08/23/2021 at 12:59 PM

## 2021-08-23 NOTE — Discharge Instructions (Signed)
As tolerated

## 2021-08-23 NOTE — Care Coordination-Inpatient (Signed)
SW contacted [facility]. Regarding referral. Patients insurance accepted. Ned to fax orders to [phone]      Electronically signed by [name], LSW on 08/23/2021 at 10:35 AM

## 2021-08-23 NOTE — Progress Notes (Signed)
CLINICAL PHARMACY NOTE: MEDS TO BEDS    Total # of Prescriptions Filled: 4   The following medications were delivered to the patient:  Hydrocodone/Acetaminophen 5/325  Lisinopril '10mg'$   Nystatin Susp  Lactulose 10GM/15ML    Additional Documentation:

## 2021-08-23 NOTE — Care Coordination-Inpatient (Signed)
DISCHARGE BARRIERS       Reason for Referral:  SW completed a Psychosocial Assessment for evaluation of patient's mental health, social status, and functional capacity within the community. Katie Juarez is a 81 y.o. female admitted due to Hypertensive urgency. Patient accompanied by child. SW provided supportive listening while patient discussed past medical history and events leading up to hospitalization.       Mental Status:  Alert, oriented, and engaging during assessment.     Decision Making:  Makes own decisions.     Family/Social/Home Environment: lives alone    Support: Discussed a good social support network     Current Services:  None    Current DMEs: walker    PCP: [name], DO and repots no issues affording medication.     Military status:   None     ADLs and means of transportation: Independent in ADLs prior to hospitalization and unable to transport self.    Food insecurity or needed financial assistance: Denies any food insecurity or financial concerns at this time.    ACP and Code Status:  SW discussed an Advance Directive which included the patient's choices for care and treatment in the case of a health event that adversely affects decision-making abilities. SW provided education and resources. Katie Juarez has no questions at this time and has agreed to keep me up-to-date should anything change.    Katie Juarez is a DNR-CCA status and Power of attorney for healthcare on file.      Collaborative List of SNF/ECF/HH were provided: offered, declined. Patient states she would like out patient [facility] at [facility]. No discharge order at this time.    Anticipated Needs/Discharge Plan:  Spoke with patient/family/representative about discharge plan. Patient/Family/Representative verbalizes understanding of the plan of care and denies discharge needs or further services at this time. SW provided business card. SW will continue to monitor needs and assist as appropriate.          Electronically signed by [name], LSW on 08/23/2021 at 10:37 AM

## 2021-08-23 NOTE — Care Coordination-Inpatient (Signed)
Case Management Assessment  Initial Evaluation    Date/Time of Evaluation: 08/23/2021 9:26 AM  Assessment Completed by: [name], LSW    If patient is discharged prior to next notation, then this note serves as note for discharge by case management.    Patient Name: Katie Juarez                   Date of Birth: Jan 31, 1940  Diagnosis: Hypertensive urgency [I16.0]                   Date / Time: 08/22/2021  6:11 PM    Patient Admission Status: Inpatient   Readmission Risk (Low < 19, Mod (19-27), High > 27): Readmission Risk Score: 12.6    Current PCP: Katie Peat, DO  PCP verified by CM? (P) Yes    Chart Reviewed: Yes      History Provided by: (P) Patient, Child/Family  Patient Orientation: (P) Alert and Oriented    Patient Cognition: (P) Alert    Hospitalization in the last 30 days (Readmission):  No    If yes, Readmission Assessment in CM Navigator will be completed.    Advance Directives:      Code Status: DNR-CCA   Patient's Primary Decision Maker is:      Primary Decision Maker: Katie Juarez - Child - [phone]    Primary Decision Maker: Katie Juarez - Child - [phone]    Primary Decision Maker: Katie Juarez - Child [phone]    Discharge Planning:    Patient lives with: Alone Type of Home: House  Primary Care Giver: (P) Self  Patient Support Systems include: Children   Current Financial resources: (P) Medicare, Financial Counseling  Current community resources: (P) None  Current services prior to admission: None            Current DME:              Type of Home Care services:  None    ADLS  Prior functional level: (P) Independent in ADLs/IADLs  Current functional level: (P) Independent in ADLs/IADLs    PT AM-PAC:   /24  OT AM-PAC:   /24    Family can provide assistance at DC: (P) Yes  Would you like Case Management to discuss the discharge plan with any other family members/significant others, and if so, who? (P) No  Plans to Return to Present Housing: (P) Yes  Other Identified  Issues/Barriers to RETURNING to current housing: n/a  Potential Assistance needed at discharge: Home Care            Potential DME:    Patient expects to discharge to: [facility] for transportation at discharge:      Financial    Payor: [place] / Plan: MEDICARE PART A AND B / Product Type: *No Product type* /     Does insurance require precert for SNF:     Potential assistance Purchasing Medications: (P) No  Meds-to-Beds request:        [facility] [address] [place]  [place] [address]  Phone: [phone] Fax: [phone]    [facility] [address] [place] - [address] [address] - P [phone] - F [phone]  [place] [address]  Phone: [phone] Fax: [phone]      Notes:    Factors facilitating achievement of predicted outcomes: Family support, Friend support, Motivated,  Cooperative, Pleasant, Sense of humor, and Good insight into deficits    Barriers to discharge: n/a    Additional Case Management Notes: Pt is independent in her ADLs and at this time unable to transport herself. Patients daughter assists with transportation. Patient is considering     The Plan for Transition of Care is related to the following treatment goals of Hypertensive urgency [J28.7]    IF APPLICABLE: The Patient and/or patient representative Katie Juarez and her family were provided with a choice of provider and agrees with the discharge plan. Freedom of choice list with basic dialogue that supports the patient's individualized plan of care/goals and shares the quality data associated with the providers was provided to:     Patient Representative Name:       The Patient and/or Patient Representative Agree with the Discharge Plan?      [name], LSW  Case Management Department

## 2021-08-23 NOTE — Progress Notes (Cosign Needed)
Physical Therapy  Facility/Department: [facility]  PROGRESSIVE CARE  Physical Therapy Initial Assessment    Name: Katie Juarez  DOB: January 10, 1941  MRN: [id number]  Date of Service: 08/23/2021    Discharge Recommendations:  Home with assist PRN, Outpatient PT, Home with Home health PT, Continue to assess pending progress, Patient would benefit from continued therapy after discharge          Patient Diagnosis(es): The encounter diagnosis was Hypertensive urgency.  Past Medical History:  has a past medical history of Basal cell carcinoma (BCC) of face, Breast cancer ([place]), Constipation, COPD (chronic obstructive pulmonary disease) ([place]), COVID-19, DDD (degenerative disc disease), Diverticulitis, Ileus ([place]), Large bowel obstruction ([place]), LBP (low back pain), Pharyngoesophageal dysphagia, and SS (spinal stenosis).  Past Surgical History:  has a past surgical history that includes Hysterectomy (1980); bladder suspension (1990); Cholecystectomy (1990); Breast lumpectomy (Right, 1999); other surgical history (11/14/13, 06/23/11, 06/30/11, 07/12/11 12/29/11); other surgical history (12/17/2013); other surgical history (Bilateral, 05/30/2014); other surgical history (Bilateral, 09/16/2014); other surgical history (Bilateral, 09/26/2014); other surgical history (10/10/2014); other surgical history (10/31/2014); Tonsillectomy (1946); Appendectomy (1956); Small intestine surgery (02/20/2015); Femoral hernia repair (Left, 02/20/2015); Excision/Biopsy (Left, 11/01/2018); Lumbar spine surgery (01/03/2019); Spine surgery (N/A, 01/03/2019); Upper gastrointestinal endoscopy (N/A, 04/24/2019); and Cardiac catheterization (12/21/2020).    Assessment   Body Structures, Functions, Activity Limitations Requiring Skilled Therapeutic Intervention: Decreased functional mobility ;Decreased ADL status;Decreased strength;Decreased endurance;Decreased balance  Therapy Prognosis: Good  Decision Making: Low Complexity  Activity Tolerance  Activity Tolerance:  Patient tolerated evaluation without incident     Plan   Physcial Therapy Plan  General Plan: 5-7 times per week  Current Treatment Recommendations: Strengthening, Balance training, Functional mobility training, Transfer training, ADL/Self-care training, IADL training, Endurance training, Gait training, Neuromuscular re-education, Stair training, Home exercise program, Safety education & training, Patient/Caregiver education & training, Equipment evaluation, education, & procurement, Therapeutic activities  Safety Devices  Type of Devices: Gait belt, Left in bed, Nurse notified, Call light within reach     Restrictions  Restrictions/Precautions  Restrictions/Precautions: Fall Risk     Subjective   General  Chart Reviewed: Yes  Patient assessed for rehabilitation services?: Yes  Family / Caregiver Present: Yes  Referring Practitioner: Katrine Coho, APRN - CNP  Diagnosis: Generalized weakness  Follows Commands: Within Functional Limits  Subjective  Subjective: Nusing and patient agreeable to PT evaluation. Patient in bed upon arrival.         Social/Functional History  Social/Functional History  Lives With: Alone  Type of Home: House  Home Layout: One level  Home Access: Stairs to enter with rails  Entrance Stairs - Number of Steps: 2 in the garage with R sided HR, 4 in the back with bil HR  Bathroom Shower/Tub: Tourist information centre manager: Handicap height  Bathroom Equipment: Grab bars in shower, Built-in shower seat, Grab bars around toilet  Home Equipment: [place] Help From: Family  ADL Assistance: [place]: Needs assistance (reporting that her daughter completes most.)  Homemaking Responsibilities: No  Ambulation Assistance: Independent  Transfer Assistance: Teacher, English as a foreign language: No  Patient's Driver Info: Was an active driver prior to a month ago, but started to feel unsteady - reporting that her daughter is driving her now. She would like to get back to  driving.  Mode of Transportation: Car  Occupation: Retired  Armed forces logistics/support/administrative officer: Within Youth worker  Hearing: Exceptions to [place] Exceptions: Hard of hearing/hearing concerns  Cognition   Orientation  Overall Orientation Status: Within Functional Limits  Cognition  Overall Cognitive Status: WFL     Objective   Pulse: 61  Heart Rate Source: Monitor  BP: 138/72  BP Location: Left upper arm  BP Method: Automatic  Patient Position: High fowlers  MAP (Calculated): 94  Respirations: 22  SpO2: 90 %  O2 Device: None (Room air)              AROM RLE (degrees)  RLE AROM: WFL  AROM LLE (degrees)  LLE AROM : WFL  Strength RLE  Strength RLE: Exception  R Hip Flexion: 4-/5  R Knee Flexion: 4+/5  R Knee Extension: 4+/5  Strength LLE  Strength LLE: Exception  L Hip Flexion: 4/5  L Knee Flexion: 4+/5  L Knee Extension: 4+/5           Bed mobility  Supine to Sit: Stand by assistance  Sit to Supine: Stand by assistance  Scooting: Stand by assistance  Transfers  Sit to Stand: Minimal Assistance  Stand to Sit: Contact guard assistance  Ambulation  Surface: Level tile  Device: Conservation officer, nature  Assistance: Minimal assistance  Gait Deviations: Slow [place];Decreased step length  Distance: 20'  Comments: Min A especially with turning due to unsteadiness noted, narrowed BOS noted.     Balance  Sitting - Static: Good  Sitting - Dynamic: Good  Standing - Static: Fair;+  Standing - Dynamic: Fair             Goals  Short Term Goals  Time Frame for Short Term Goals: LOS  Short Term Goal 1: Patient will complete bed mobility with MI assistance  Short Term Goal 2: Patient will complete transfers with MI assistance  Short Term Goal 3: Patient will ambulate 150' with RW or least restrictive device and MI assistance           Therapy Time   Individual Concurrent Group Co-treatment   Time In 0908         Time Out 0918         Minutes 10                 Kristine Linea, PT

## 2021-08-23 NOTE — Progress Notes (Signed)
Discharge instructions reviewed with patient and her daughter.  Medications sent to Clinic pharmacy.  Referrals sent for follow up visits.  Understanding verbalized.

## 2021-08-23 NOTE — Discharge Instructions (Signed)
Follow up with Dr. Renard Hamper in one week    Follow up with cardiology in 2 weeks    Follow up with Kathline Magic. rehab

## 2021-08-23 NOTE — Telephone Encounter (Signed)
Patient needs hospital. Patient was admitted to [facility]. Discharge date was 08/23/21. Diagnosis is high blood pressure. Please call daughters number at [phone]

## 2021-08-23 NOTE — Progress Notes (Signed)
Chaplain rounding in PCU.    Assessment: Patient was waiting to be discharged and accompanied by her daughter Truman Hayward).  Patient asked for prayers for answers and wisdom for a "game plan."  Patient has the support of her family and church community.     08/23/21 1610   Encounter Summary   Encounter Overview/Reason  Initial Encounter   Service Provided For: Patient and family together   Referral/Consult From: [place] [facility];Congregation/faith community;Family members   Last Encounter  08/23/21   Complexity of Encounter Low   Begin Time 1356   End Time  1358   Total Time Calculated 2 min   Spiritual/Emotional needs   Type Spiritual Support   Assessment/Intervention/Outcome   Assessment Calm;Hopeful   Intervention Active listening;Prayer (assurance of)/Blessing   Outcome Acceptance;Encouraged;Engaged in conversation;Expressed Gratitude;Receptive         Intervention: Engaged in conversation and active listening. Prayed with Patient and her daughter.     Outcome: Patient expressed appreciation for visit and offer of continued prayer.    Plan: Chaplains are available on site or on call 24/7 for spiritual and emotional support.    Electronically signed by Luanna Salk on 08/23/2021 at 4:10 PM

## 2021-08-23 NOTE — Other (Signed)
Katie Juarez, RCPPatient Assessment complete. Hypertensive urgency [I16.0] .   Vitals:    08/23/21 0615   BP: (!) 147/75   Pulse: 68   Resp:    Temp:    SpO2:    . Patients home meds are   Prior to Admission medications    Medication Sig Start Date End Date Taking? Authorizing Provider   traMADol (ULTRAM) 50 MG tablet Take 2 tablets by mouth every 8 hours as needed for Pain for up to 15 days. Max Daily Amount: 300 mg 08/16/21 08/31/21  Katie Koyanagi Black, DO   EQ 8HR ARTHRITIS PAIN RELIEF 650 MG extended release tablet TAKE 2 TABLETS BY MOUTH EVERY 8 HOURS AS NEEDED FOR PAIN FOR FEVER 08/05/21   Historical Provider, MD   latanoprost (XALATAN) 0.005 % ophthalmic solution  08/09/21   Historical Provider, MD   omeprazole (PRILOSEC) 40 MG delayed release capsule  08/09/21   Historical Provider, MD   LORazepam (ATIVAN) 0.5 MG tablet Take 1 tablet by mouth nightly for 90 days. Max Daily Amount: 0.5 mg 07/12/21 10/10/21  Katie Koyanagi Black, DO   ibuprofen (ADVIL;MOTRIN) 600 MG tablet Take 1 tablet by mouth 2 times daily as needed for Pain 06/14/21   Katie Koyanagi Black, DO   umeclidinium-vilanterol [name] ELLIPTA) 62.5-25 MCG/ACT inhaler Inhale 1 puff into the lungs daily 06/11/21   Katie Peat, DO   nitroGLYCERIN (NITROSTAT) 0.4 MG SL tablet Place 1 tablet under the tongue every 5 minutes as needed for Chest pain 06/02/21   Katie Ash, APRN - CNP   citalopram (CELEXA) 20 MG tablet Take 1 tablet by mouth daily 04/13/21   Katie Koyanagi Black, DO   atorvastatin (LIPITOR) 20 MG tablet Take 1 tablet by mouth daily 04/13/21   Katie Peat, DO   donepezil (ARICEPT) 5 MG tablet Take 1 tablet by mouth nightly 04/13/21   Katie Koyanagi Black, DO   Probiotic Acidophilus [facility]) TABS Take 1 tablet by mouth daily    Historical Provider, MD   polyethylene glycol (GLYCOLAX) 17 GM/SCOOP powder Take 17 g by mouth daily As needed.    Historical Provider, MD   alendronate (FOSAMAX) 70 MG tablet Take 1 tablet by mouth every 7 days 04/11/20   Katie Koyanagi Black, DO   CRANBERRY PO  Take by mouth    Historical Provider, MD   Cholecalciferol (VITAMIN D3) 50 MCG (2000 UT) CAPS Take by mouth    Historical Provider, MD   Handicap Placard MISC by Does not apply route Issue parking placard for person with disability; Applicant meets the qualifying disability criteria. Length of time expected to have disability.  Prescription expires in 5 years from issuing date. 07/30/19   Katie Koyanagi Black, DO   CALCIUM CARBONATE-VITAMIN D PO Take 600 mg by mouth 2 times daily Contains 600 mg Calcium & 800 IU Vitamin D3.    Historical Provider, MD   Multiple Vitamins-Minerals (THERAPEUTIC MULTIVITAMIN-MINERALS) tablet Take 1 tablet by mouth daily    Historical Provider, MD   albuterol sulfate HFA (PROVENTIL;VENTOLIN;PROAIR) 108 (90 Base) MCG/ACT inhaler Ventolin HFA 90 mcg/actuation aerosol inhaler   Inhale 2 puffs every 6 hours by inhalation route as needed.   ASTHMA  Patient not taking: Reported on 08/16/2021    Historical Provider, MD   polyvinyl alcohol-povidone (HYPOTEARS) 1.4-0.6 % ophthalmic solution Place 1-2 drops into both eyes as needed    Historical Provider, MD       Assessment pt. Unable to perform bedside spirometry  at this  IS volume 2100    RR 16  Breath Soclear      Bronchodilator assessment at level  1  Hyperinflation assessment at level 1  Secretion Management assessment at level  1    '[]'$     Bronchodilator Assessment  BRONCHODILATOR ASSESSMENT SCORE  Score 0 '1 2 3 4 5   '$ Breath Sounds   '[]'$   Patient Baseline '[x]'$   No Wheeze good aeration '[]'$   Faint, scattered wheezing, good aeration '[]'$   Expiratory Wheezing and or moderately diminished '[]'$   Insp/Exp wheeze and/or very diminished '[]'$   Insp/Exp and/ or marked distress   Respiratory Rate   '[x]'$   Patient Baseline '[]'$   Less than 20 '[]'$   Less than 20 '[]'$   20-25 '[]'$   Greater than 25 '[]'$   Greater than 25   Peak flow % of Pred or PB '[x]'$   NA   '[]'$   Greater than 90%  '[]'$   81-90% '[]'$   71-80% '[]'$   Less than or equal to 70%  or unable to perform '[]'$   Unable due to Respiratory Distress    Dyspnea re '[]'$   Patient Baseline '[x]'$   No SOB '[]'$   No SOB '[]'$   SOB on exertion '[]'$   SOB min activity '[]'$   At rest/acute   e FEV% Predicted       '[x]'$   NA '[]'$   Above 69%  '[]'$   Unable '[]'$   Above 60-69%  '[]'$   Unable '[]'$   Above 50-59%  '[]'$   Unable '[]'$   Above 35-49%  '[]'$   Unable '[]'$   Less than 35%  '[]'$   Unable                 '[]'$   Hyperinflation Assessment  Score '1 2 3   '$ CXR and Breath Sounds   '[x]'$   Clear '[]'$   No atelectasis  Basilar aeration '[]'$   Atelectasis or absent basilar breath sounds   Incentive Spirometry Volume  (Per IBW)   '[]'$   Greater than or equal to 69m/Kg '[]'$   less than 168mKg '[]'$   less than 1569mg   Surgery within last 2 weeks '[]'$   None or general   '[]'$   Abdominal or thoracic surgery  '[]'$   Abdominal or thoracic   Chronic Pulmonary Historyre '[]'$   No '[]'$   Yes '[]'$   Yes     '[]'$   Secretion Management Assessment  Score '1 2 3   '$ Bilateral Breath Sounds   '[]'$   Occasional Rhonchi '[]'$   Scattered Rhonchi '[]'$   Course Rhonchi and/or poor aeration   Sputum    '[]'$   Small amount of thin secretions '[]'$   Moderate amount of viscous secretions '[]'$   [place], Viscious Yellow/ Secretions   CXR as reported by physician '[]'$   clear  '[]'$   Unavailable '[]'$   Infiltrates and/or consolidation  '[]'$   Unavailable '[]'$   Mucus Plugging and or lobar consolidation  '[]'$   Unavailable   Cough '[]'$   Strong, productive cough '[]'$   Weak productive cough '[]'$   No cough or weak non-productive cough   MatSamul DadaCP  6:58 AM                            FEMALE                                  FEMALE  FEV1 Predicted Normal Values                        FEV1 Predicted Normal Values          Age                                     Height in Feet and Inches       Age                                     Height in Feet and Inches       4\' 11"  5\' 1"  5\' 3"  5\' 5"  5\' 7"  5\' 9"  5\' 11"  6\' 1"   4\' 11"  5\' 1"  5\' 3"  5\' 5"  5\' 7"  5\' 9"  5\' 11"  6\' 1"    42 - 45 2.49 2.66 2.84 3.03 3.22 3.42 3.62 3.83 42 - 45 2.82 3.03 3.26 3.49 3.72 3.96 4.22 4.47   46 - 49 2.40 2.57 2.76 2.94 3.14 3.33 3.54  3.75 46 - 49 2.70 2.92 3.14 3.37 3.61 3.85 4.10 4.36   50 - 53 2.31 2.48 2.66 2.85 3.04 3.24 3.45 3.66 50 - 53 2.58 2.80 3.02 3.25 3.49 3.73 3.98 4.24   54 - 57 2.21 2.38 2.57 2.75 2.95 3.14 3.35 3.56 54 - 57 2.46 2.67 2.89 3.12 3.36 3.60 3.85 4.11   58 - 61 2.10 2.28 2.46 2.65 2.84 3.04 3.24 3.45 58 - 61 2.32 2.54 2.76 2.99 3.23 3.47 3.72 3.98   62 - 65 1.99 2.17 2.35 2.54 2.73 2.93 3.13 3.34 62 - 65 2.19 2.40 2.62 2.85 3.09 3.33 3.58 3.84   66 - 69 1.88 2.05 2.23 2.42 2.61 2.81 3.02 3.23 66 - 69 2.04 2.26 2.48 2.71 2.95 3.19 3.44 3.70   70+ 1.82 1.99 2.17 2.36 2.55 2.75 2.95 3.16 70+ 1.97 2.19 2.41 2.64 2.87 3.12 3.37 3.62             Predicted Peak Expiratory Flow Rate                                       Height (in)  Female       Height (in) Female           Age 17 44 60 62 43 66 69 70 Age            20 344 357 372 387 402 417 432 446  60 62 64 66 68 70 72 74 76   25 337 352 366 381 396 411  426 441 25 447 476 505 533 [phone] 677   30 329 344 359 374 389 404 419 434 30 437 466 494  523 552 580 609 638 667   35 322 337 351 366 381 396 411 426 35 426 455 484 [phone] 657    40 314 329 344 359 374 389 404 419 40 416 445 473 502 531 559 588 617 647   45 307 322 336 351 366  381 396 411 45 405 434 463 491 520 549 577 606 636   50 299 314 329 344 359 374 389 404 50 395 424  452 481 510 538 567  596 625   55 292 307 321 336 351 366 381 396 55 384 413 442 470 499 528 556 585 615   60 284 299 314 329 344 359 374 389 60 374 403 431 460 489 517 546 575 605   65 277 292 306 321 336 351 366 381 65 363 392 421 449 478 507 535 564 594   70 269 284 299 314 329 344 359 374 70 353 382 410 439 468 496 525 554 583   75 261 274 289 305 319 334 348 364 75 344 372 400 429 458 487 515 544 573   80 253 266 282 296 312 327 342 356 80 335 362 390 419 448 476 [phone]

## 2021-08-23 NOTE — Discharge Instructions (Addendum)
Good nutrition is important when healing from an illness, injury, or surgery.  Follow any nutrition recommendations given to you during your hospital stay.   If you were given an oral nutrition supplement while in the hospital, continue to take this supplement at home.  You can take it with meals, in-between meals, and/or before bedtime. These supplements can be purchased at most local grocery stores, pharmacies, and chain super-stores.   If you have any questions about your diet or nutrition, call the hospital and ask for the dietitian.  Cardiac diet

## 2021-08-24 LAB — LIPID PANEL
Chol/HDL Ratio: 2.8 (ref <5)
Cholesterol: 121 mg/dL (ref <200)
HDL: 43 mg/dL (ref >40)
LDL Cholesterol: 55 mg/dL (ref 0–130)
Triglycerides: 117 mg/dL (ref <150)

## 2021-08-24 NOTE — Telephone Encounter (Signed)
Kaylon returning nurses call, please call.

## 2021-08-24 NOTE — Telephone Encounter (Signed)
Care Transitions Initial Follow Up Call    Outreach made within 2 business days of discharge: Yes    Patient: Katie Juarez Patient DOB: 05-Jan-1941   MRN: [id number]  Reason for Admission: There are no discharge diagnoses documented for the most recent discharge.  Discharge Date: 08/23/21       Spoke with: Paschal Dopp DTR    Discharge department/facility: [facility]    TCM Interactive Patient Contact:  Was patient able to fill all prescriptions: Yes  Was patient instructed to bring all medications to the follow-up visit: Yes  Is patient taking all medications as directed in the discharge summary? Yes  Does patient understand their discharge instructions: Yes  Does patient have questions or concerns that need addressed prior to 7-14 day follow up office visit: yes - WILL RUN OUT OF Ama TO OV AND PRIOR TO FIRST VISIT WITH PAIN MGMT. WOULD LIKE TO HAVE EXTENSION OF RX UNTIL OV.    Scheduled appointment with PCP within 7-14 days; 09/01/21 AT 340PM,    Follow Up  Future Appointments   Date Time Provider [place]   08/30/2021 10:15 AM Juliene Pina, APRN - CNP [facility]   09/10/2021  2:20 PM [name], DO [facility]   06/17/2022 10:00 AM MDCX PF, AWV NURSE SCHEDULE [facility]       Domingo Mend, RN

## 2021-08-24 NOTE — Care Coordination-Inpatient (Signed)
Care Transitions Outreach Attempt #1    Call within 2 business days of discharge: Yes   Attempted to reach patient for transitions of care follow up. Unable to reach patient. HIPAA compliant message left on VM requesting a return call.    Patient: Katie Juarez Patient DOB: [birth year] MRN: [id number]    Last Discharge [place]       Date Complaint Diagnosis Description Type Department Provider    08/22/21 Dizziness Hypertensive urgency ... ED to Hosp-Admission (Discharged) (ADMITTED) [facility] PROG Janene Harvey, MD; Manuela Neptune, ...              Was this an external facility discharge? No Discharge Facility: [facility]    Noted following upcoming appointments from discharge chart review:   [facility] follow up appointment(s):   Future Appointments   Date Time Provider [place]   09/10/2021  2:20 PM [place], DO [facility]   12/06/2021  1:00 PM Sinan Alo, DO [facility]   06/17/2022 10:00 AM MDCX PF, AWV NURSE SCHEDULE DFAM [place], [facility]/ Care Transition Nurse  [phone]

## 2021-08-24 NOTE — Telephone Encounter (Signed)
LMTRC

## 2021-08-24 NOTE — Care Coordination-Inpatient (Signed)
[facility] Care Transitions Initial Follow Up Call    Call within 2 business days of discharge: Yes    Care Transition Nurse spoke with the  daughter Paschal Dopp  by telephone to perform post hospital discharge assessment. Verified name and DOB with  daughter  as identifiers. Provided introduction to self, and explanation of the Care Transition Nurse role.     Patient: Katie Juarez Patient DOB: 26-Nov-1940   MRN: [id number]  Reason for Admission: Hypertensive Urgency  Discharge Date: 08/23/21 RARS: Readmission Risk Score: 12.9      Last Discharge [place]       Date Complaint Diagnosis Description Type Department Provider    08/22/21 Dizziness Hypertensive urgency ... ED to Hosp-Admission (Discharged) (ADMITTED) [facility] PROG Janene Harvey, MD; Manuela Neptune, ...            Was this an external facility discharge? No Discharge Facility: [facility] to be reviewed by the provider   Additional needs identified to be addressed with provider: Yes    Patient is in need of a hospital follow up within 1 week of discharge.  She only has enough Norco for another 1.5 days and requests a refill please.             Method of communication with provider: chart routing.    Spoke with: daughter Paschal Dopp    Daughter Paschal Dopp returned call to Probation officer.  She states her mother is there with her and is staying with her currently because she is not able to care for herself at this time.  Patient has been struggling with her back and right hip pain today.  Daughter states she had not been taking the Norco regularly and they are now "playing catch up" to get her pain under control again.  She is scheduled to see pain management on 8/24.  Patient only has 1.5 days left of Norco currently and would like PCP to refill.  Med rec completed and all meds were received through Meds to [facility].  BP has been controlled 146/72 was this mornings reading.  Daughter  educated on the side effects of constipation with the use of Norco.  Patient encouraged  to increase fluids and take all meds as prescribed.  She did report a BM yesterday.  Message sent to PCP for Norco refill and HFU appt.  Daughter states she wll reach out to Dr Dorthula Perfect office to scheduled 2 week HFU.    Care Transition Nurse reviewed discharge instructions with family who verbalized understanding. The family was given an opportunity to ask questions and does not have any further questions or concerns at this time. Were discharge instructions available to patient? Yes. Reviewed appropriate site of care based on symptoms and resources available to patient including: PCP  Specialist. The family agrees to contact the PCP office for questions related to their healthcare.     Advance Care Planning:   Does patient have an Advance Directive: reviewed and current.    Medication reconciliation was performed with  daughter Paschal Dopp , who verbalizes understanding of administration of home medications. Medications reviewed.    Non-face-to-face services provided:  Obtained and reviewed discharge summary and/or continuity of care documents  Education of patient/family/caregiver/guardian to support self-management-.    Offered patient enrollment in the Remote Patient Monitoring (RPM) program for in-home monitoring:  Will discuss at next outreach .    Care Transitions 24 Hour Call    Do you have a copy of your  discharge instructions?: Yes  Do you have all of your prescriptions and are they filled?: Yes  Have you been contacted by a [place]?: No  Have you scheduled your follow up appointment?: No  Do you feel like you have everything you need to keep you well at home?: Yes  Care Transitions Interventions         Follow Up  Future Appointments   Date Time Provider [place]   09/10/2021  2:20 PM [name], DO [facility]   12/06/2021  1:00 PM Sinan Alo, DO [facility]   06/17/2022 10:00 AM MDCX PF, AWV NURSE SCHEDULE [facility]       Care Transition Nurse provided contact information.  Plan for follow-up  call in 3-5 days based on severity of symptoms and risk factors.  Plan for next call:      - Sxs of back and hip pain, thrush, BP      - check status of HFU's with PCP and Dr Francine Graven     - Offer RPM for HTN               Otto Herb, LPN  [facility] Transition Nurse  [phone]

## 2021-08-24 NOTE — Telephone Encounter (Signed)
error 

## 2021-08-24 NOTE — Addendum Note (Signed)
Addended by: Marilynne Drivers on: 08/24/2021 12:08 PM     Modules accepted: Orders

## 2021-08-26 MED ORDER — HYDROCODONE-ACETAMINOPHEN 5-325 MG PO TABS
5-325 MG | ORAL_TABLET | Freq: Three times a day (TID) | ORAL | 0 refills | Status: AC | PRN
Start: 2021-08-26 — End: 2021-08-31

## 2021-08-26 NOTE — Telephone Encounter (Signed)
Controlled Substance Monitoring:    Acute and Chronic Pain Monitoring:   RX Monitoring 08/26/2021   Periodic Controlled Substance Monitoring No signs of potential drug abuse or diversion identified.    -

## 2021-08-27 LAB — CULTURE, BLOOD 2: Culture: NO GROWTH

## 2021-08-27 LAB — CULTURE, BLOOD 1: Culture: NO GROWTH

## 2021-08-27 NOTE — Care Coordination-Inpatient (Signed)
Care Transitions Outreach Attempt    Call within 2 business days of discharge: Yes   Attempted to reach patient for transitions of care follow up. Unable to reach patient.    Patient: Katie Juarez Patient DOB: February 15, 1940 MRN: [id number]    Last Discharge [place]       Date Complaint Diagnosis Description Type Department Provider    08/22/21 Dizziness Hypertensive urgency ... ED to Hosp-Admission (Discharged) (ADMITTED) [facility] PROG Janene Harvey, MD; Manuela Neptune, ...          # 1 attempt-Attempted to reach patient for subsequent call. Left [employer] appropriate message with contact information requesting return call to [phone]     Was this an external facility discharge? No Discharge Facility: [facility]    Noted following upcoming appointments from discharge chart review:   [facility] follow up appointment(s):   Future Appointments   Date Time Provider [place]   08/30/2021 10:15 AM Juliene Pina, APRN - CNP [facility]   09/01/2021  3:40 PM [name], DO [facility]   09/10/2021  2:20 PM [name], DO [facility]   06/17/2022 10:00 AM MDCX PF, AWV NURSE SCHEDULE [facility]     Non-[facility] follow up appointment(s):

## 2021-08-30 ENCOUNTER — Ambulatory Visit: Admit: 2021-08-30 | Discharge: 2021-08-30 | Payer: MEDICARE | Attending: Family | Primary: Family Medicine

## 2021-08-30 DIAGNOSIS — R079 Chest pain, unspecified: Secondary | ICD-10-CM

## 2021-08-30 DIAGNOSIS — I10 Essential (primary) hypertension: Secondary | ICD-10-CM

## 2021-08-30 MED ORDER — LISINOPRIL 10 MG PO TABS
10 MG | ORAL_TABLET | Freq: Every day | ORAL | 3 refills | Status: AC
Start: 2021-08-30 — End: 2021-09-21

## 2021-08-30 NOTE — Progress Notes (Signed)
Cardiology Consultation/Follow Up.  [name]  September 03, 1940  [id number]    Today: 08/30/21    CC: Patient is here for ER f/u for HTN    HPI:   Katie Juarez is seen & examined in room with daughter. States she is having issues with chronic/more worsening back pain and that is what actually took her to the ER on 8/14 however they then found her BP very high. She denies any chest pain, palpitations, dizziness (other than when she takes the Norco) or SOB/DOE. States her back is always painful but has been worse the last few months. They had MRI done and states they just got results today and are waiting to see when she can get in with ortho. She has quit taking the Norco because it was making her feel awful and is now just taking Tylenol '650mg'$  BID. She has been checking her BP at home and it is running in the 130-142 range but today before coming into office she states it was 122.     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    excised by Dr Yehuda Savannah     Breast cancer [facility]) 1999    in remission    Constipation 12/27/2018    COPD (chronic obstructive pulmonary disease) ([place])     COVID-19 11/06/2020    DDD (degenerative disc disease)     Diverticulitis 06/28/2018    Ileus ([place]) 08/07/2021    Large bowel obstruction ([place]) 02/2015    [facility]    LBP (low back pain)     Pharyngoesophageal dysphagia     SS (spinal stenosis)          Past Surgical:  Past Surgical History:   Procedure Laterality Date    [place]    BREAST LUMPECTOMY Right 1999    CARDIAC CATHETERIZATION  12/21/2020    Dr Lowell Bouton    CHOLECYSTECTOMY  1990    EXCISION/BIOPSY Left 11/01/2018    Dr Yehuda Savannah / [name], for [place] Left 02/20/2015    strangulated with small bowel resection - [facility],  [facility], [place]  1980    Done for endometriosis; done  in [place]  01/03/2019    VERTEBRAL AUMENTATION  L1      OTHER SURGICAL HISTORY  11/14/13, 06/23/11, 06/30/11, 07/12/11 12/29/11    L4/L5 IESI    OTHER SURGICAL HISTORY  12/17/2013    L5/S1 IESI    OTHER SURGICAL HISTORY Bilateral 05/30/2014    SIJ and piriformis    OTHER SURGICAL HISTORY Bilateral 09/16/2014    L3, L4, L5 Diagnostic Medial Branch Block    OTHER SURGICAL HISTORY Bilateral 09/26/2014    L5 TFE    OTHER SURGICAL HISTORY  10/10/2014    bil L5 TFE    OTHER SURGICAL HISTORY  10/31/2014    caudal    SMALL INTESTINE SURGERY  02/20/2015    strangulated left femoral hernia    SPINE SURGERY N/A 01/03/2019    VERTEBRAL AUMENTATION  L1  (C-ARM X2, performed by Loura Back, MD at [facility] 04/24/2019    EGD BIOPSY performed by Monna Fam, MD at [facility]  OR         Family History:  Family History   Problem Relation Age of Onset    Other Mother         ALS - diagnosed at age 47    Stroke Father         age 36    Cancer Sister         pt thinks it was liver    Cancer Brother         leukemia    Diabetes Maternal Grandfather     Other Brother         drowned    Other Brother         died at age 24; think due to spinal meningitis    Lupus Sister     Alzheimer's Disease Sister     Dementia Sister     Mult Sclerosis Nephew     Mult Sclerosis Daughter        Social History:  Social History     Socioeconomic History    Marital status: Widowed     Spouse name: Not on file    Number of children: Not on file    Years of education: Not on file    Highest education level: Not on file   Occupational History    Not on file   Tobacco Use    Smoking status: Former     Packs/day: 1.00     Years: 40.00     Pack years: 40.00     Types: Cigarettes     Start date: 01/10/1953     Quit date: 01/11/1988     Years since quitting: 33.6    Smokeless tobacco: Never    Tobacco comments:     quit 25 years ago 1990   Vaping Use    Vaping Use: Never used    Substance and Sexual Activity    Alcohol use: No     Alcohol/week: 0.0 standard drinks    Drug use: Never    Sexual activity: Not Currently   Other Topics Concern    Not on file   Social History Narrative    Not on file     Social Determinants of Health     Financial Resource Strain: Low Risk     Difficulty of Paying Living Expenses: Not hard at all   Food Insecurity: No Food Insecurity    Worried About Charity fundraiser in the Last Year: Never true    [place] in the Last Year: Never true   Transportation Needs: Unknown    Lack of Transportation (Medical): Not on file    Lack of Transportation (Non-Medical): No   Physical Activity: Insufficiently Active    Days of Exercise per Week: 1 day    Minutes of Exercise per Session: 60 min   Stress: Not on file   Social Connections: Not on file   Intimate Partner Violence: Not on file   Housing Stability: Unknown    Unable to Pay for Housing in the Last Year: Not on file    Number of Places Lived in the Last Year: Not on file    Unstable Housing in the Last Year: No        REVIEW OF SYSTEMS:    Constitutional: there has been no unanticipated weight loss. There's been No change in energy level, No change in activity level.     Eyes: No visual changes or diplopia. No scleral  icterus.  ENT: No Headaches, hearing loss or vertigo. No mouth sores or sore throat.  Cardiovascular: AS HPI  Respiratory: AS HPI  Gastrointestinal: No abdominal pain, appetite loss, blood in stools. No change in bowel or bladder habits.  Genitourinary: No dysuria, trouble voiding, or hematuria.  Musculoskeletal:  No gait disturbance, No weakness or joint complaints.  Integumentary: No rash or pruritis.  Neurological: No headache, diplopia, change in muscle strength, numbness or tingling. No change in gait, balance, coordination, mood, affect, memory, mentation, behavior.  Psychiatric: No new anxiety or depression.  Endocrine: No temperature intolerance. No excessive thirst, fluid intake, or  urination. No tremor.  Hematologic/Lymphatic: No abnormal bruising or bleeding, blood clots or swollen lymph nodes.  Allergic/Immunologic: No nasal congestion or hives.    Medications:    Current Outpatient Medications:     HYDROcodone-acetaminophen (NORCO) 5-325 MG per tablet, Take 1 tablet by mouth every 8 hours as needed for Pain for up to 5 days. Take lowest dose possible to manage pain Max Daily Amount: 3 tablets, Disp: 15 tablet, Rfl: 0    aspirin 81 MG EC tablet, Take 1 tablet by mouth daily, Disp: 30 tablet, Rfl: 0    lisinopril (PRINIVIL;ZESTRIL) 10 MG tablet, Take 1 tablet by mouth daily, Disp: 30 tablet, Rfl: 0    lactulose (CHRONULAC) 10 GM/15ML solution, Take 15 mLs by mouth every evening, Disp: 473 mL, Rfl: 0    nystatin (MYCOSTATIN) 100000 UNIT/ML suspension, Take 5 mLs by mouth 4 times daily for 10 days Retain in mouth as long as possible, Disp: 200 mL, Rfl: 0    EQ 8HR ARTHRITIS PAIN RELIEF 650 MG extended release tablet, TAKE 2 TABLETS BY MOUTH EVERY 8 HOURS AS NEEDED FOR PAIN FOR FEVER, Disp: , Rfl:     latanoprost (XALATAN) 0.005 % ophthalmic solution, , Disp: , Rfl:     omeprazole (PRILOSEC) 40 MG delayed release capsule, , Disp: , Rfl:     LORazepam (ATIVAN) 0.5 MG tablet, Take 1 tablet by mouth nightly for 90 days. Max Daily Amount: 0.5 mg, Disp: 90 tablet, Rfl: 0    umeclidinium-vilanterol (ANORO ELLIPTA) 62.5-25 MCG/ACT inhaler, Inhale 1 puff into the lungs daily, Disp: 90 each, Rfl: 1    nitroGLYCERIN (NITROSTAT) 0.4 MG SL tablet, Place 1 tablet under the tongue every 5 minutes as needed for Chest pain, Disp: 25 tablet, Rfl: 3    citalopram (CELEXA) 20 MG tablet, Take 1 tablet by mouth daily, Disp: 90 tablet, Rfl: 3    atorvastatin (LIPITOR) 20 MG tablet, Take 1 tablet by mouth daily, Disp: 90 tablet, Rfl: 1    donepezil (ARICEPT) 5 MG tablet, Take 1 tablet by mouth nightly, Disp: 90 tablet, Rfl: 3    Probiotic Acidophilus (FLORANEX) TABS, Take 1 tablet by mouth daily, Disp: , Rfl:      polyethylene glycol (GLYCOLAX) 17 GM/SCOOP powder, Take 17 g by mouth daily As needed., Disp: , Rfl:     alendronate (FOSAMAX) 70 MG tablet, Take 1 tablet by mouth every 7 days, Disp: 12 tablet, Rfl: 3    CRANBERRY PO, Take by mouth, Disp: , Rfl:     Cholecalciferol (VITAMIN D3) 50 MCG (2000 UT) CAPS, Take by mouth, Disp: , Rfl:     Handicap Placard MISC, by Does not apply route Issue parking placard for person with disability; Applicant meets the qualifying disability criteria. Length of time expected to have disability.  Prescription expires in 5 years from issuing date., Disp: 1 each, Rfl:  0    CALCIUM CARBONATE-VITAMIN D PO, Take 600 mg by mouth 2 times daily Contains 600 mg Calcium & 800 IU Vitamin D3., Disp: , Rfl:     Multiple Vitamins-Minerals (THERAPEUTIC MULTIVITAMIN-MINERALS) tablet, Take 1 tablet by mouth daily, Disp: , Rfl:     polyvinyl alcohol-povidone (HYPOTEARS) 1.4-0.6 % ophthalmic solution, Place 1-2 drops into both eyes as needed, Disp: , Rfl:      Physical Exam:   Vitals: BP 110/62 (Site: Right Upper Arm, Position: Sitting)   Pulse 84   Wt 133 lb (60.3 kg)   LMP  (LMP Unknown)   SpO2 94%   BMI 22.83 kg/m   General appearance: alert and cooperative with exam  HEENT: Head: Normocephalic, no lesions, without obvious abnormality.  Neck: no carotid bruit, no JVD  Lungs: clear to auscultation bilaterally  Heart:  regular rate and rhythm, S1, S2 normal, no Murmur  Abdomen: soft, non-tender; bowel sounds normal; no masses,  no organomegaly  Extremities: no site injection hematoma, extremities normal, atraumatic, no cyanosis. no edema  Neurologic: Mental status: Alert, oriented, thought content appropriate    Labs:  Lab Results   Component Value Date    CHOL 121 08/23/2021    TRIG 117 08/23/2021    HDL 43 08/23/2021    LDLCHOLESTEROL 55 08/23/2021    LDLCALC 48.0 12/03/2018    VLDL NOT REPORTED (H) 11/05/2019    CHOLHDLRATIO 2.8 08/23/2021       Lab Results   Component Value Date    NA 138  08/23/2021    K 4.2 08/23/2021    CL 104 08/23/2021    CO2 24 08/23/2021    BUN 24 (H) 08/23/2021    CREATININE 0.7 08/23/2021    GLUCOSE 98 08/23/2021    CALCIUM 9.3 08/23/2021    PROT 7.2 08/22/2021    LABALBU 4.7 08/22/2021    BILITOT 0.4 08/22/2021    ALKPHOS 46 08/22/2021    AST 13 08/22/2021    ALT 11 08/22/2021    LABGLOM >60 08/23/2021    GFRAA >60 06/15/2020    AGRATIO 1.4 09/27/2017    GLOB 2.8 08/07/2021       EKG:   SR  No acute ST changes, non-specific     Cardiac Cath  12/2020  Indications:    - Atypical chest pain       - Dyspnea       - Abnormal nuclear perfusion test      Conclusions      Procedure Summary      Minimal CAD   D1: proximal 70% but small branch   Preserved LV systolic function    Past Medical and Surgical History, Problem List, Allergies, Medications, Labs, Imaging, all reviewed extensively in EMR and with the patient.    Assessment:  - chest pain, concern for angina given risk factors  - SOB  - L arm pain  - some diaphoresis     Echo 08/23/21  Result status: Final result       Left Ventricle: Normal left ventricular systolic function with a visually estimated EF of 60 - 65%. EF by 2D Simpsons Biplane is 65%. Left ventricle size is normal. Mildly increased wall thickness. Diastolic dysfunction present with normal LV EF. Normal left ventricular filling pressure.    Aortic Valve: Trileaflet valve. Mild sclerosis of the aortic valve cusp. Mild regurgitation with a centrally directed jet.    Mitral Valve: Valve structure is normal. Mild annular calcification of the mitral  valve. Mild regurgitation with an anterior directed jet.    Tricuspid Valve: Mild regurgitation with a centrally directed jet. The estimated RVSP is 41 mmHg. Mildly elevated RVSP.    Plan:  - BP much better and controlled. Will continue Lisinopril along with BP home monitoring and pain control for her back. Support provided. Discussed OTC medications for her back along with BP fluctuations with pain. Questions/concerns  addressed.   - Recent echo reviewed and compared with prior. Preserved LVEF with mild valvular disease. Discussed with pt and daughter. Questions/concerns addressed. Continue to follow.  - I instructed her to go to the nearest ER if develops CP that doesnt resolve. She agrees.   - F/U with ortho for MRI findings and further pain management. Support provided.    The patient is to continue heart healthy diet, weight loss and exercise as tolerated. Patient's medications and side effects were discussed. Medication refills were provided if needed. Follow up appointment timing was discussed. All questions and concerns were addressed to patient's satisfaction.     The patient is to follow up in 6 months or sooner if necessary.     Thank you for allowing me to participate in the care of this patient, please do not hesitate to call if you have any questions.    Juliene Pina, APRN - [facility]  [email or web address]  [phone]

## 2021-08-31 NOTE — Care Coordination-Inpatient (Signed)
[employer] Care Transitions Follow Up Call    Patient Current Location:  Home: [address]  [place] [address]    LPN Care Coordinator contacted the patient by telephone to follow up after admission on 8/14.  Verified name and DOB with patient as identifiers.    Patient: Katie Juarez  Patient DOB: [birth year]   MRN: [id number]  Reason for Admission: Hypertensive urgency   Discharge Date: 08/23/21 RARS: Readmission Risk Score: 12.9      Needs to be reviewed by the provider   Additional needs identified to be addressed with provider: No  none             Method of communication with provider: none.    Subsequent transitional call. Spoke to :  Yaelis today. She says that her back pain is not too bad today. She denies HA dizziness cp palpitations dizziness nausea vomiting or diarrhea. Appetite is good. B/B good.     She has a cardiology appointment on yesterday 8.21 no medication changes . She is to continue to check home BP and her lisinopril continued . BP in office was 110/62. Before Probation officer could ask more questions she stated I have appointment today at 2:30 in [place]. She does not know where. Writer would have to ask her daughter because she made the appointment.     Addressed changes since last contact:  none  Discussed follow-up appointments. If no appointment was previously scheduled, appointment scheduling offered: Yes.   Is follow up appointment scheduled within 7 days of discharge? 8/21 Cardiology .    Follow Up  Future Appointments   Date Time Provider [place]   09/01/2021  3:40 PM Leandrew Koyanagi [place], DO [facility]   09/10/2021  2:20 PM [name], DO [facility]   12/20/2021  2:45 PM Sinan Alo, DO DCARDIO [facility]   06/17/2022 10:00 AM MDCX PF, AWV NURSE SCHEDULE [facility]         LPN Care Coordinator reviewed discharge instructions, medical action plan, and red flags with patient and discussed any barriers to care and/or understanding of plan of care after discharge. Discussed appropriate site of care based on  symptoms and resources available to patient including: PCP  Specialist  Urgent care clinics  When to call [place]. The patient agrees to contact the PCP office for questions related to their healthcare.     Advance Care Planning:   reviewed and current.       Interventions to address risk factors: Obtained and reviewed discharge summary and/or continuity of care documents    Offered patient enrollment in the Remote Patient Monitoring (RPM) program for in-home monitoring:  did not discuss pt had appt to get to .     Care Transitions Subsequent and Final Call    Subsequent and Final Calls  Do you have any ongoing symptoms?: No  Onset of Patient-reported symptoms: Other  Have your medications changed?: No  Do you have any questions related to your medications?: No  Do you currently have any active services?: No  Do you have any needs or concerns that I can assist you with?: No  Identified Barriers: None  Care Transitions Interventions  Other Interventions:             LPN Care Coordinator provided contact information for future needs. Plan for follow-up call in 5-7 days based on severity of symptoms and risk factors.  Plan for next call: symptom management-hows BP back pain  follow-up appointment-8/21 app ask daughter about. 8/23 HFU app review  Discuss RPM    Deneen Harts, LPN

## 2021-09-01 ENCOUNTER — Ambulatory Visit: Admit: 2021-09-01 | Discharge: 2021-09-01 | Payer: MEDICARE | Attending: Family Medicine | Primary: Family Medicine

## 2021-09-01 ENCOUNTER — Inpatient Hospital Stay: Payer: MEDICARE | Primary: Family Medicine

## 2021-09-01 DIAGNOSIS — R300 Dysuria: Secondary | ICD-10-CM

## 2021-09-01 LAB — URINALYSIS WITH REFLEX TO CULTURE
Bilirubin Urine: NEGATIVE
Ketones, Urine: NEGATIVE mg/dL
Leukocyte Esterase, Urine: NEGATIVE
Nitrite, Urine: NEGATIVE
Protein, UA: NEGATIVE mg/dL
Specific Gravity, UA: 1.03 — ABNORMAL HIGH (ref 1.010–1.025)
Urobilinogen, Urine: NORMAL EU/dL (ref 0.0–1.0)
pH, UA: 5.5 (ref 5.0–6.0)

## 2021-09-01 LAB — MICROSCOPIC URINALYSIS
Epithelial Cells UA: 5 /HPF (ref 0–5)
RBC, UA: 0 /HPF (ref 0–4)
WBC, UA: 0 /HPF (ref 0–4)

## 2021-09-01 NOTE — Progress Notes (Signed)
Post-Discharge Transitional Care  Follow Up      Katie Juarez   Date of Birth:  May 28, 1940    Date of Office Visit:  09/01/2021  Date of Hospital Admission: 08/22/21  Date of Hospital Discharge: 08/23/21  Risk of hospital readmission (high >=14%. Medium >=10%) :Readmission Risk Score: 12.9      Care management risk score Rising risk (score 2-5) and Complex Care (Scores >=6): No Risk Score On File     Non face to face  following discharge, date last encounter closed (first attempt may have been earlier): 09/10/2021    Call initiated 2 business days of discharge: Yes    ASSESSMENT/PLAN:   Burning with urination  -     Urinalysis with Reflex to Culture; Future  -     Culture, Urine; Future  Hospital discharge follow-up  -     PR DISCHARGE MEDS RECONCILED W/ CURRENT OUTPATIENT MED LIST      Medical Decision Making: moderate complexity  Return in 6 weeks (on 10/11/2021) for f/u HTN, back pain, specialist appt's at 10:20 AM.           Subjective:   HPI:  Follow up of Hospital problems/diagnosis(es):   Norco - loopy  Tramadol - dizzy, sweaty    Just taking 2 of the 650 Tylenol every 6-12 hours for her pain    Sees Spine specialist on 9/14 - Dr. Karilyn Cota    Done with Dr. Lars Masson    Seeing Dr. Lynnell Catalan - just saw him yesterday  Started on Medrol dose pak - started that today; also taking Levaquin 750 mg daily  Switched inhaler to Trelegy  Turned in sputum sample to their office today  Seeing him again in 1 month (9/27)  Having PFT prior to next appt    On 9/11, Katie Juarez will see Neurologist (Dr. Jimmye Norman)    Pt started on Lisinopril 10 mg daily for HTN on discharge - stable; BP  No further blurred vision  Checking BP at least once per day - was 135/70 today    Will see Pain Mgmt tomorrow    Ortho did second hip injection, but not sure that it helped much      Inpatient course: Discharge summary reviewed- see chart.    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    Pain of both sacroiliac joints    Piriformis syndrome of left side    Lumbar radicular pain    Neural foraminal stenosis of lumbar spine    Chronic low back pain    Spinal stenosis, lumbar    Acquired absence of other specified parts of digestive tract    Arthrodesis status    Asymptomatic menopausal state    Extravasation of urine    Gastro-esophageal reflux disease without esophagitis    Hyperlipidemia, unspecified    Closed compression fracture of body of L1 vertebra (HCC)    Lumbar burst fracture, open, initial encounter ([place])    Pharyngoesophageal dysphagia    Anxiety    Benign essential tremor    Dizziness    Memory problem    Balance problems    Chronic cerebral ischemia    Neck stiffness    Tingling sensation    Left arm weakness    Dysphagia    Brain  dysfunction    Carotid stenosis, asymptomatic, bilateral    Knee pain    Fatigue    Osteoarthrosis    Alzheimer's disease, unspecified    Chronic obstructive pulmonary disease, unspecified    Hypertensive urgency    Bradycardia    Frequent falls    Thrush, oral    Glaucoma of both eyes    Diverticulosis    Coronary artery disease involving native heart without angina pectoris    Essential hypertension       Medications listed as ordered at the time of discharge from hospital     Medication List            Accurate as of September 01, 2021 11:59 PM. If you have any questions, ask your nurse or doctor.                CONTINUE taking these medications      alendronate 70 MG tablet  Commonly known as: Fosamax  Take 1 tablet by mouth every 7 days     aspirin 81 MG EC tablet  Take 1 tablet by mouth daily     atorvastatin 20 MG tablet  Commonly known as: LIPITOR  Take 1 tablet by mouth daily     CALCIUM CARBONATE-VITAMIN D PO     citalopram 20 MG tablet  Commonly known as: CELEXA  Take 1 tablet by mouth daily     CRANBERRY PO     donepezil 5 MG  tablet  Commonly known as: Aricept  Take 1 tablet by mouth nightly     EQ 8HR Arthritis Pain Relief 650 MG extended release tablet  Generic drug: acetaminophen     Handicap Placard Misc  by Does not apply route Issue parking placard for person with disability; Applicant meets the qualifying disability criteria. Length of time expected to have disability.  Prescription expires in 5 years from issuing date.     lactulose 10 GM/15ML solution  Commonly known as: CHRONULAC  Take 15 mLs by mouth every evening     latanoprost 0.005 % ophthalmic solution  Commonly known as: XALATAN     levoFLOXacin 750 MG tablet  Commonly known as: LEVAQUIN     lisinopril 10 MG tablet  Commonly known as: PRINIVIL;ZESTRIL  Take 1 tablet by mouth daily     LORazepam 0.5 MG tablet  Commonly known as: ATIVAN  Take 1 tablet by mouth nightly for 90 days. Max Daily Amount: 0.5 mg     methylPREDNISolone 4 MG tablet  Commonly known as: MEDROL DOSEPACK     nitroGLYCERIN 0.4 MG SL tablet  Commonly known as: Nitrostat  Place 1 tablet under the tongue every 5 minutes as needed for Chest pain     nystatin 100000 UNIT/ML suspension  Commonly known as: MYCOSTATIN  Take 5 mLs by mouth 4 times daily for 10 days Retain in mouth as long as possible     omeprazole 40 MG delayed release capsule  Commonly known as: PRILOSEC     polyethylene glycol 17 GM/SCOOP powder  Commonly known as: GLYCOLAX     polyvinyl alcohol-povidone 1.4-0.6 % ophthalmic solution  Commonly known as: HYPOTEARS     Probiotic Acidophilus Tabs     therapeutic multivitamin-minerals tablet     Trelegy Ellipta 100-62.5-25 MCG/ACT Aepb inhaler  Generic drug: fluticasone-umeclidin-vilant     Vitamin D3 50 MCG (2000 UT) Caps            STOP taking these medications  Anoro Ellipta 62.5-25 MCG/ACT inhaler  Generic drug: umeclidinium-vilanterol                Medications marked "taking" at this time  Outpatient Medications Marked as Taking for the 09/01/21 encounter (Office Visit) with [name], DO   Medication Sig Dispense Refill    lisinopril (PRINIVIL;ZESTRIL) 10 MG tablet Take 1 tablet by mouth daily 90 tablet 3    aspirin 81 MG EC tablet Take 1 tablet by mouth daily 30 tablet 0    [EXPIRED] nystatin (MYCOSTATIN) 100000 UNIT/ML suspension Take 5 mLs by mouth 4 times daily for 10 days Retain in mouth as long as possible 200 mL 0    EQ 8HR ARTHRITIS PAIN RELIEF 650 MG extended release tablet TAKE 2 TABLETS BY MOUTH EVERY 8 HOURS AS NEEDED FOR PAIN FOR FEVER      latanoprost (XALATAN) 0.005 % ophthalmic solution       omeprazole (PRILOSEC) 40 MG delayed release capsule       LORazepam (ATIVAN) 0.5 MG tablet Take 1 tablet by mouth nightly for 90 days. Max Daily Amount: 0.5 mg 90 tablet 0    nitroGLYCERIN (NITROSTAT) 0.4 MG SL tablet Place 1 tablet under the tongue every 5 minutes as needed for Chest pain 25 tablet 3    citalopram (CELEXA) 20 MG tablet Take 1 tablet by mouth daily 90 tablet 3    atorvastatin (LIPITOR) 20 MG tablet Take 1 tablet by mouth daily 90 tablet 1    donepezil (ARICEPT) 5 MG tablet Take 1 tablet by mouth nightly 90 tablet 3    Probiotic Acidophilus (FLORANEX) TABS Take 1 tablet by mouth daily      polyethylene glycol (GLYCOLAX) 17 GM/SCOOP powder Take 17 g by mouth daily As needed.      alendronate (FOSAMAX) 70 MG tablet Take 1 tablet by mouth every 7 days 12 tablet 3    CRANBERRY PO Take by mouth      Cholecalciferol (VITAMIN D3) 50 MCG (2000 UT) CAPS Take by mouth      Handicap Placard MISC by Does not apply route Issue parking placard for person with disability; Applicant meets the qualifying disability criteria. Length of time expected to have disability.  Prescription expires in 5 years from issuing date. 1 each 0    CALCIUM CARBONATE-VITAMIN D PO Take 600 mg by mouth 2 times daily Contains 600 mg Calcium & 800 IU Vitamin D3.      Multiple Vitamins-Minerals (THERAPEUTIC MULTIVITAMIN-MINERALS) tablet Take 1 tablet by mouth daily          Medications patient taking as of now  reconciled against medications ordered at time of hospital discharge: Yes    Review of Systems   Constitutional:  Positive for appetite change (decreased).   Gastrointestinal:  Positive for constipation (better recently; has not had to use the Lactulose, but is still using Miralax daily.  Has stopped stool softeners.).   Genitourinary:  Positive for dysuria (intermittent x past week). Negative for flank pain, frequency and hematuria.       Objective:    BP 126/80 (Site: Right Upper Arm, Position: Sitting)   Pulse 68   Temp 97.7 F (36.5 C) (Temporal)   Resp 18   Ht '5\' 4"'$  (1.626 m)   Wt 137 lb 14.4 oz (62.6 kg)   LMP  (LMP Unknown)   SpO2 95%   BMI 23.67 kg/m     Physical Exam  Constitutional:  General: Katie Juarez is not in acute distress.     Appearance: Normal appearance.   HENT:      Head: Normocephalic and atraumatic.   Eyes:      Conjunctiva/sclera: Conjunctivae normal.   Cardiovascular:      Rate and Rhythm: Normal rate and regular rhythm.      Heart sounds: Normal heart sounds.   Pulmonary:      Effort: Pulmonary effort is normal. No respiratory distress.      Breath sounds: Normal breath sounds.   Abdominal:      General: Bowel sounds are normal. There is no distension.      Palpations: Abdomen is soft.      Tenderness: There is no abdominal tenderness.   Musculoskeletal:      Right lower leg: No edema.      Left lower leg: No edema.   Skin:     General: Skin is warm and dry.   Neurological:      General: No focal deficit present.      Mental Status: Katie Juarez is alert and oriented to person, place, and time.   Psychiatric:         Mood and Affect: Mood normal.           An electronic signature was used to authenticate this note.  --[name], DO

## 2021-09-03 ENCOUNTER — Encounter

## 2021-09-03 LAB — CULTURE, URINE: Culture: NO GROWTH

## 2021-09-09 NOTE — Care Coordination-Inpatient (Signed)
Care Transitions Outreach Attempt    Call within 2 business days of discharge: Yes   Attempted to reach patient for transitions of care follow up. Unable to reach patient.    Patient: Katie Juarez Patient DOB: 06-27-1940 MRN: [id number]    Last Discharge [place]       Date Complaint Diagnosis Description Type Department Provider    08/22/21 Dizziness Hypertensive urgency ... ED to Hosp-Admission (Discharged) (ADMITTED) [facility] PROG Janene Harvey, MD; Manuela Neptune, ...          # 1 attempt-Attempted to reach patient for subsequent call. Left Hipaa appropriate message with contact information requesting return call to [phone]     Was this an external facility discharge? No Discharge Facility: [facility]    Noted following upcoming appointments from discharge chart review:   [facility] follow up appointment(s):   Future Appointments   Date Time Provider [place]   09/23/2021  9:45 AM Madolyn Frieze, MD [facility]   10/11/2021 10:20 AM [name], DO [facility]   12/20/2021  2:45 PM Sinan Alo, DO [facility]   06/17/2022 10:00 AM MDC[facility] PF, AWV NURSE SCHEDULE [facility]     Non-[facility] follow up appointment(s):

## 2021-09-10 ENCOUNTER — Encounter: Payer: MEDICARE | Attending: Family Medicine | Primary: Family Medicine

## 2021-09-10 ENCOUNTER — Encounter

## 2021-09-10 NOTE — Care Coordination-Inpatient (Signed)
Care Transitions Outreach Attempt    Call within 2 business days of discharge: Yes   Attempted to reach patient for transitions of care follow up. Unable to reach patient.    Patient: Katie Juarez Patient DOB: 01/30/1940 MRN: [id number]    Last Discharge [place]       Date Complaint Diagnosis Description Type Department Provider    08/22/21 Dizziness Hypertensive urgency ... ED to Hosp-Admission (Discharged) (ADMITTED) [facility] PROG Janene Harvey, MD; Manuela Neptune, ...          # 2 attempt-Attempted to reach patient for subsequent call. Left Hipaa appropriate message with contact information requesting return call to [phone]     2 unsuccessful attempts to reach patient, care transition episode resolved//JU    Was this an external facility discharge? No Discharge Facility: [facility]    Noted following upcoming appointments from discharge chart review:   [facility] follow up appointment(s):   Future Appointments   Date Time Provider [place]   09/23/2021  9:45 AM Madolyn Frieze, MD [facility]   10/11/2021 10:20 AM [name], DO [facility]   12/20/2021  2:45 PM Sinan Alo, DO [facility]   06/17/2022 10:00 AM M[facility] PF, AWV NURSE SCHEDULE [facility]     Non-[facility] follow up appointment(s):

## 2021-09-20 NOTE — Progress Notes (Signed)
Formatting of this note is different from the original.  Images from the original note were not included.    [place]  [place] [address]    Patient:  Katie Juarez  Date of Birth: April 22, 1940  Encounter Date: 09/20/2021     Patient Care Team:  [name], DO as PCP - General (Family Medicine)    History of Present Illness:     Katie Juarez is an 81 y.o. woman referred to Neurology by Dr. Lars Masson (ENT) for evaluation of choking on food.  She reports that she can choke on food or liquid while trying to swallow.  She also has frequent cough, productive of discolored phlegm per her history.  ENT evaluation did not reveal any abnormality.  As result of this cough, the patient's family reports that her voice is somewhat now somewhat raspy.  She has had no history of aspiration pneumonia in the past.  She has no fever.  There is no history of stroke or TIA.  Per the referring note, the patient has absent gag reflex.  The patient does report back pain and right lower extremity weakness due to lumbar pathology, for which she is now receiving physical therapy.  Katie Juarez has had a swallow study, which was normal.  Of note, the patient's mother accompanied her to the office today.  The patient's mother reported that she feels Katie Juarez is anxious, causing her symptoms.    Pertinent negatives:  The patient denies any progressive muscular weakness concerning for motor neuron disease.  She denies falls.     Brain CT 06/24/21 Impression: "No acute intracranial findings."       Allergies:   Morphine, Penicillins, and Zithromax [azithromycin]    Past Medical, Family, Surgical, and Social History Update:     The following portions of the patient's history were reviewed and updated as appropriate: allergies, current medications, past family history, past medical history, past social history, past surgical history and problem list.    Past Medical History:   Diagnosis Date    Anemia     Anxiety     Arthritis     Asthma      Breast cancer (CMS-HCC)     over 20 years ago    Cataract     Depression     Emphysema     Emphysema of lung (CMS-HCC)     Essential tremor     GERD (gastroesophageal reflux disease)     Glaucoma     Hematuria syndrome     HL (hearing loss)     Irregular heartbeat     Memory impairment     Shortness of breath     Spinal stenosis     Urgency of urination     UTI (urinary tract infection)     Visual impairment      Family History   Problem Relation Age of Onset    ALS Mother     Anesthesia problems Mother     Anesthesia problems Father     Lung disease Father     Migraines Father     Thyroid disease Father     Anesthesia problems Sister     Clotting disorder Sister     Anesthesia problems Brother     Clotting disorder Brother     Anesthesia problems Daughter     Anesthesia problems Son      Past Surgical History:   Procedure Laterality Date    ABDOMINAL SURGERY  APPENDECTOMY      BLADDER SUSPENSION      BREAST LUMPECTOMY Right 2001    lumpectomy    CATARACT EXTRACTION, BILATERAL      CHOLECYSTECTOMY      COLON SURGERY      CORONARY ANGIOPLASTY  2009    at [place]      flow pvr 2008    [place]      with bowel obstruction    HYSTERECTOMY      SPINE SURGERY      TONSILLECTOMY       Current Outpatient Medications   Medication Sig Dispense Refill    acetaminophen (TYLENOL ARTHRITIS) 650 mg 8 hr tablet Take 2 tablets (1,300 mg total) by mouth every 8 (eight) hours as needed for pain.      acidophilus-pectin, citrus 25 million cell -100 mg tablet Take 1 tablet by mouth daily with breakfast.      alendronate (FOSAMAX) 70 mg tablet Take 1 tablet (70 mg total) by mouth every 7 days.  In a.m. with water on empty stomach, nothing else by mouth and remain upright for 43mn      atorvastatin (LIPITOR) 20 mg tablet Take by mouth.      calcium carbonate (OS-CAL) 600 mg (1,500 mg) tablet Take 1 tablet (600 mg total) by mouth in the morning and 1 tablet (600 mg total) in the evening. Take with  meals.      cholecalciferol, vitamin D3, 2,000 units capsule Take 1 capsule (2,000 Units total) by mouth in the morning.      citalopram (CeleXA) 20 mg tablet Take 1 tablet (20 mg total) by mouth in the morning.      cranberry extract 425 mg capsule Take 1 capsule by mouth in the morning.      donepeziL (ARICEPT) 5 mg tablet Take 1 tablet (5 mg total) by mouth nightly.      ibuprofen (ADVIL,MOTRIN) 600 mg tablet Take by mouth every 8 (eight) hours as needed.      latanoprost (XALATAN) 0.005 % ophthalmic solution Administer 1 drop to both eyes in the morning.      lisinopriL (PRINIVIL,ZESTRIL) 10 mg tablet Take 1 tablet (10 mg total) by mouth in the morning.      LORazepam (ATIVAN) 0.5 mg tablet Take 1 tablet (0.5 mg total) by mouth once daily at bedtime.      multivitamin capsule Take 1 capsule by mouth in the morning.      nitroglycerin (NITROSTAT) 0.4 MG SL tablet Place 1 tablet (0.4 mg total) under the tongue every 5 (five) minutes as needed for chest pain.      omeprazole (PriLOSEC) 40 mg capsule Take 1 capsule (40 mg total) by mouth in the morning.      POLYETHYLENE GLYCOL 3350 (MIRALAX ORAL) Take by mouth.      polyvinyl alcohol-povidon,PF, 1.4-0.6 % dropperette 1-2 gtt OU prn      TRELEGY ELLIPTA 100-62.5-25 mcg blister with device Inhale 1 puff once daily.       No current facility-administered medications for this visit.    (All medications reviewed and updated by provider since last office visit or hospitalization)     Social History:   Social History     Substance and Sexual Activity   Alcohol Use No     Social History     Tobacco Use   Smoking Status Former    Packs/day: 2.00  Years: 35.00    Pack years: 70.00    Types: Cigarettes   Smokeless Tobacco Never    (If patient a smoker, smoking cessation counseling offered)    Review of Systems:     Neurological ROS:  Review of Systems   Constitutional:  Negative for chills and fever.   HENT:  Positive for trouble swallowing and voice change.    Eyes:   Negative for photophobia.   Respiratory:  Positive for cough and choking. Negative for shortness of breath.    Cardiovascular:  Negative for chest pain and palpitations.   Gastrointestinal:  Negative for nausea and vomiting.   Endocrine: Negative for cold intolerance and heat intolerance.   Genitourinary:  Negative for dysuria, enuresis and frequency.   Musculoskeletal:  Positive for back pain. Negative for gait problem, myalgias and neck pain.   Skin:  Negative for rash.   Allergic/Immunologic: Negative for environmental allergies.   Neurological:  Positive for weakness. Negative for dizziness, seizures, syncope, facial asymmetry, speech difficulty and light-headedness.   Hematological:  Does not bruise/bleed easily.   Psychiatric/Behavioral:  Negative for sleep disturbance. The patient is nervous/anxious.      Physical Exam:      Vitals:  Vitals:    09/23/21 1055   BP: 185/83   Pulse: 51   Weight: 63.5 kg (140 lb)   Height: 162.6 cm ('5\' 4"'$ )     Neurological Physical Exam:  General:  Well-appearing, no apparent distress.  HEENT:  Head is normocephalic and atraumatic.    Neck: Supple.  No thyromegaly.  No carotid bruits.  Chest: Clear to auscultation.  Heart: Regular rate and rhythm.  No murmurs auscultated.  Extremities: No edema.    Neurological examination:    Mental status: The patient is awake alert and oriented 3.  Speech is within normal limits.  There is no dysarthria or aphasia.    Cranial nerves: Pupils are equally round reactive to light and accommodation. The extraocular motions are intact with no nystagmus.  Patient's face is symmetric with equal facial sensation.  Hearing is reduced to finger rubs in the right ear.  The tongue protruded midline and the palate elevated symmetrically.  Shoulder shrug is intact bilaterally.  Motor examination: Full strength throughout at 5/5, including biceps, triceps, deltoids, digit extensors, hip flexors, knee flexors, knee extensors, and ankle dorsiflexors.  There is  normal tone and bulk throughout with no tremor or involuntary movement noted.  No fasciculations noted.  Reflexes:    Biceps Triceps Brachioradialis Patella Achilles   Right '2 2 2 2 '$ 0   Left '2 2 2 2 '$ 0     Sensory examination: Sensation is intact and equal bilaterally to fine touch and temperature sensation. Vibratory sense is intact at the bilateral great toes.  Coordination: Finger to nose maneuvers are within normal limits bilaterally.  There is no dysmetria.  The patient walks with a normal gait and no ataxia.       Assessment and Plan:     Adalei Novell is an 81 y/o woman who reports cough productive of clear mucous and post-nasal drip, as well as choking on food and liquids.  She has normal palate elevation on exam.  There is no history of stroke, and she does not suggest symptoms of motor neuron disease.  Her exam is normal.    PLAN:  No further interventions planned form the neurological perspective.     Follow-up: prn         Tommy Medal  [place], MD, PhD  Associate Professor  [employer] [facility]      Electronically signed by Geralynn Rile, MD at 09/23/2021 11:25 AM EDT

## 2021-09-21 MED ORDER — LISINOPRIL 10 MG PO TABS
10 MG | ORAL_TABLET | Freq: Every day | ORAL | 3 refills | Status: AC
Start: 2021-09-21 — End: 2021-12-14

## 2021-09-23 ENCOUNTER — Inpatient Hospital Stay: Admit: 2021-09-23 | Payer: MEDICARE | Attending: Orthopaedic Surgery | Primary: Family Medicine

## 2021-09-23 ENCOUNTER — Ambulatory Visit: Admit: 2021-09-23 | Discharge: 2021-09-23 | Payer: MEDICARE | Attending: Surgical | Primary: Family Medicine

## 2021-09-23 DIAGNOSIS — M545 Low back pain, unspecified: Secondary | ICD-10-CM

## 2021-09-23 DIAGNOSIS — M542 Cervicalgia: Secondary | ICD-10-CM

## 2021-09-23 MED ORDER — METHYLPREDNISOLONE 4 MG PO TBPK
4 MG | PACK | ORAL | 0 refills | Status: DC
Start: 2021-09-23 — End: 2021-09-23

## 2021-09-23 NOTE — Progress Notes (Signed)
Orthopaedic Progress Note      CHIEF COMPLAINT:  lumbar pain    HISTORY OF PRESENT ILLNESS:    The patient is a 81 year old female who is having constant lumbar pain with radiation of pain into the right buttock and lateral leg. The patient reports symptoms began 6 months ago. No known injury or fall. Current VAS score 5/10. Percentage wise, 50% of the pain is in the back and 50% into the right leg. Activities that aggravate the pain include standing and walking. Activities that help alleviate the pain include rest. Modifying factors include medication management, physical therapy, and multiple injections done for the right hip. These have provided mild relief with no lasting benefit. Patient denies bowel or bladder dysfunction or frank weakness into the extremities. 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 10/2018    excised by Dr Yehuda Savannah     Breast cancer [facility]) 1999    in remission    Constipation 12/27/2018    COPD (chronic obstructive pulmonary disease) ([place])     COVID-19 11/06/2020    DDD (degenerative disc disease)     Diverticulitis 06/28/2018    Ileus ([place]) 08/07/2021    Large bowel obstruction ([place]) 02/2015    [facility]    LBP (low back pain)     Pharyngoesophageal dysphagia     SS (spinal stenosis)        Past Surgical History:    Past Surgical History:   Procedure Laterality Date    [place]    BREAST LUMPECTOMY Right 1999    CARDIAC CATHETERIZATION  12/21/2020    Dr Lowell Bouton    CHOLECYSTECTOMY  1990    EXCISION/BIOPSY Left 11/01/2018    Dr Yehuda Savannah / [name], for [place] Left 02/20/2015    strangulated with small bowel resection - [facility],  [facility] [place]  1980    Done for endometriosis; done in [place]  01/03/2019    VERTEBRAL  AUMENTATION  L1      OTHER SURGICAL HISTORY  11/14/13, 06/23/11, 06/30/11, 07/12/11 12/29/11    L4/L5 IESI    OTHER SURGICAL HISTORY  12/17/2013    L5/S1 IESI    OTHER SURGICAL HISTORY Bilateral 05/30/2014    SIJ and piriformis    OTHER SURGICAL HISTORY Bilateral 09/16/2014    L3, L4, L5 Diagnostic Medial Branch Block    OTHER SURGICAL HISTORY Bilateral 09/26/2014    L5 TFE    OTHER SURGICAL HISTORY  10/10/2014    bil L5 TFE    OTHER SURGICAL HISTORY  10/31/2014    caudal    SMALL INTESTINE SURGERY  02/20/2015    strangulated left femoral hernia    SPINE SURGERY N/A 01/03/2019    VERTEBRAL AUMENTATION  L1  (C-ARM X2, performed by Loura Back, MD at [facility] 04/24/2019    EGD BIOPSY performed by Monna Fam, MD at [facility] OR         Current  Medications:  Current Outpatient Medications   Medication Sig Dispense Refill    lisinopril (PRINIVIL;ZESTRIL) 10 MG tablet Take 1 tablet by mouth daily 90  tablet 3    TRELEGY ELLIPTA 100-62.5-25 MCG/ACT AEPB inhaler       aspirin 81 MG EC tablet Take 1 tablet by mouth daily 30 tablet 0    EQ 8HR ARTHRITIS PAIN RELIEF 650 MG extended release tablet TAKE 2 TABLETS BY MOUTH EVERY 8 HOURS AS NEEDED FOR PAIN FOR FEVER      latanoprost (XALATAN) 0.005 % ophthalmic solution       omeprazole (PRILOSEC) 40 MG delayed release capsule       LORazepam (ATIVAN) 0.5 MG tablet Take 1 tablet by mouth nightly for 90 days. Max Daily Amount: 0.5 mg 90 tablet 0    nitroGLYCERIN (NITROSTAT) 0.4 MG SL tablet Place 1 tablet under the tongue every 5 minutes as needed for Chest pain 25 tablet 3    citalopram (CELEXA) 20 MG tablet Take 1 tablet by mouth daily 90 tablet 3    atorvastatin (LIPITOR) 20 MG tablet Take 1 tablet by mouth daily 90 tablet 1    donepezil (ARICEPT) 5 MG tablet Take 1 tablet by mouth nightly 90 tablet 3    Probiotic Acidophilus (FLORANEX) TABS Take 1 tablet by mouth daily      polyethylene glycol (GLYCOLAX) 17  GM/SCOOP powder Take 17 g by mouth daily As needed.      alendronate (FOSAMAX) 70 MG tablet Take 1 tablet by mouth every 7 days 12 tablet 3    CRANBERRY PO Take by mouth      Cholecalciferol (VITAMIN D3) 50 MCG (2000 UT) CAPS Take by mouth      CALCIUM CARBONATE-VITAMIN D PO Take 600 mg by mouth 2 times daily Contains 600 mg Calcium & 800 IU Vitamin D3.      Multiple Vitamins-Minerals (THERAPEUTIC MULTIVITAMIN-MINERALS) tablet Take 1 tablet by mouth daily      polyvinyl alcohol-povidone (HYPOTEARS) 1.4-0.6 % ophthalmic solution Place 1-2 drops into both eyes as needed      lactulose (CHRONULAC) 10 GM/15ML solution Take 15 mLs by mouth every evening (Patient not taking: Reported on 09/01/2021) 473 mL 0    Handicap Placard MISC by Does not apply route Issue parking placard for person with disability; Applicant meets the qualifying disability criteria. Length of time expected to have disability.  Prescription expires in 5 years from issuing date. (Patient not taking: Reported on 09/23/2021) 1 each 0     No current facility-administered medications for this visit.       Allergies:  Morphine, Pcn [penicillins], Zithromax [azithromycin], and Adhesive tape    Social History:   Social History     Tobacco Use   Smoking Status Former    Packs/day: 1.00    Years: 40.00    Additional pack years: 0.00    Total pack years: 40.00    Types: Cigarettes    Start date: 01/10/1953    Quit date: 01/11/1988    Years since quitting: 33.7   Smokeless Tobacco Never   Tobacco Comments    quit 25 years ago 1990     Social History     Substance and Sexual Activity   Alcohol Use No    Alcohol/week: 0.0 standard drinks of alcohol     Social History     Substance and Sexual Activity   Drug Use Never       Family History:  Family History   Problem Relation Age of Onset    Other Mother         ALS - diagnosed at age 13  Stroke Father         age 36    Cancer Sister         pt thinks it was liver    Cancer Brother         leukemia    Diabetes Maternal  Grandfather     Other Brother         drowned    Other Brother         died at age 82; think due to spinal meningitis    Lupus Sister     Alzheimer's Disease Sister     Dementia Sister     Mult Sclerosis Nephew     Mult Sclerosis Daughter        REVIEW OF SYSTEMS:  CONSTITUTIONAL:  negative for fevers, chills  RESPIRATORY:  negative for cough and shortness of breath  CARDIOVASCULAR:  negative for chest pain, palpitations  GASTROINTESTINAL:  negative for nausea, vomiting, incontinence  GENITOURINARY:  negative for frequency and urinary incontinence  MUSCULOSKELETAL:  (+) back and right leg pain  NEUROLOGICAL:  negative for numbness/tingling, headaches  BEHAVIOR/PSYCH:  negative for depressed mood, increased anxiety      PHYSICAL EXAM:  BP 110/60   Ht '5\' 4"'$  (1.626 m)   Wt 137 lb (62.1 kg)   LMP  (LMP Unknown)   BMI 23.52 kg/m     GENERAL:  alert and oriented x 3. No apparent distress, appears stated age, calm and cooperative. antalgic gait  HEAD: atraumatic, normocephalic  SPINE: limited lumbar ROM. TTP lumbar spine.   MUSCULOSKELETAL: 5/5 muscle strength bilateral lower extremities  NEUROLOGIC: sensory intact to touch bilateral lower extremities. negative Spurling's sign. negative SLR. negative clonus. negative Hoffman.   SKIN: warm, dry. inspection of spine shows, no skins lesions or open wounds.   RESPIRATORY: no respiratory distress      DATA:  CBC:   Lab Results   Component Value Date/Time    WBC 13.8 08/23/2021 08:40 AM    HGB 13.3 08/23/2021 08:40 AM    PLT 210 08/23/2021 08:40 AM     BMP:    Lab Results   Component Value Date/Time    NA 138 08/23/2021 08:40 AM    K 4.2 08/23/2021 08:40 AM    CL 104 08/23/2021 08:40 AM    CO2 24 08/23/2021 08:40 AM    BUN 24 08/23/2021 08:40 AM    CREATININE 0.7 08/23/2021 08:40 AM    CALCIUM 9.3 08/23/2021 08:40 AM    GLUCOSE 98 08/23/2021 08:40 AM    GLUCOSE 88 08/07/2021 01:10 PM     PT/INR:    Lab Results   Component Value Date/Time    PROTIME 10.2 01/02/2019 07:11 PM     INR 1.0 01/02/2019 07:11 PM     Troponin:    Lab Results   Component Value Date/Time    TROPONINI < 0.012 10/17/2019 02:20 PM     No results for input(s): "LIPASE", "AMYLASE" in the last 72 hours.  No results for input(s): "AST", "ALT", "BILIDIR", "BILITOT", "ALKPHOS" in the last 72 hours.  Uric Acid:  No components found for: "URIC"  Urine Culture:  No components found for: "CURINE"    Radiology:   CT CHEST WO CONTRAST    Result Date: 09/06/2021  [facility]                                         [place] [address]                                                                                        CT Scan Report / CT chest wo con                                                 Signed    Patient: Katie Juarez, Katie Juarez                                                                MR#: [id number]  [id number]          DOB: Jul 02, 1940                                                                [id number]            Age/Sex: 38 / F                                                                ADM Date: 09/06/21            Loc: CT                                                                                     Attending Dr: Eliot Ford M.D.  Primary Care Dr. [name] D.O.      Ordering Physician: Eliot Ford MD  Date of Service: 09/06/21  Procedure(s): CT chest wo con  Accession Number(s): [id number]  cc: [name] D.O.; Lughmani,Naeem MD       CLINICAL HISTORY:  81 year-old female with a history of chronic bronchitis, breast cancer, multiple kyphoplasties     DOSE LOWERING EXPOSURE/TECHNIQUE:  This CT scan was performed using optimization techniques as follows:  automatic exposure control, adjustment of MA or KV according to patient size and use of iterative reconstruction technique.      TECHNIQUE/VIEWS:  Axial images obtained along with multiplanar reconstructed images from the 3D  data set that includes coronal and sagittal views.  CT of the chest without contrast.    COMPARISON:  No relevant prior studies available.    FINDINGS:     The lungs are emphysematous.      No segmental consolidations or pleural effusions.     No pneumothorax.     Chronic elevation of the left hemidiaphragm with mild compressive left lower lobe atelectasis.      No evidence of an aortic aneurysm.      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      There is no adenopathy.      No pericardial effusion.      IMPRESSION:  1.  Moderate emphysema.        Dictated By:        Charlotta Newton D.O.                                              Signed By:          <Electronically signed by Charlotta Newton, D.O.>                                          09/06/21 1436                                                                                                                                                                          DD: 09/06/21                                                        TD/TT: 09/06/21 1228     Transcriptionist: NEE      MRI lumbar done on 08/17/21 personally reviewed by myself shows prior L4-5 fusion with no complications of hardware. L3-4 right posterior disc protrusion extending inferiorly posterior to L4 with  moderately severe facet arthropathy causing moderately severe right and moderate left foraminal stenosis but no canal   stenosis.           Diagnosis Orders   1. Lumbar spine pain        2. Lumbar stenosis with neurogenic claudication        3. Lumbar degenerative disc disease          ASSESSMENT:  Prior L4-5 fusion  Prior T12 and L1 kyphoplasty  L3-4 disc protrusion and facet arthropathy with stenosis and radiculopathy    PLAN:  Discussed w/ Dr. Remer Macho. MRI imaging reviewed and discussed the patient. The recommendation is an epidural steroid injection at L3-4 to be CT guided or fluoroscopy guided. We will refer the patient to Pain Management for the injection. Patient was  given a medrol dose pack. We will see them back in our office on a prn basis with any new or worsening symptoms.        Electronically signed by Mikey Kirschner. Bonnye Halle, PA-C on 09/23/2021 at 11:25 AM

## 2021-10-02 ENCOUNTER — Encounter

## 2021-10-04 NOTE — Telephone Encounter (Signed)
[facility] called requesting a refill of the below medication which has been pended for you:     Requested Prescriptions     Pending Prescriptions Disp Refills    atorvastatin (LIPITOR) 20 MG tablet [Pharmacy Med Name: Atorvastatin Calcium 20 MG Oral Tablet] 90 tablet 0     Sig: Take 1 tablet by mouth once daily       Last Appointment Date: 09/01/2021  Next Appointment Date: 10/11/2021    Allergies   Allergen Reactions    Morphine Other (See Comments)     Night sweats,felt "loopy".    Pcn [Penicillins] Hives    Zithromax [Azithromycin] Hives    Adhesive Tape Rash

## 2021-10-04 NOTE — Telephone Encounter (Signed)
Pt is due now for LP before her OV on 10/2 - does she have enough med to last until then?

## 2021-10-05 NOTE — Telephone Encounter (Signed)
Pt has enough to last until OV. Will get labs done this week and refill next week.

## 2021-10-07 LAB — LIPID PANEL
Chol/HDL Ratio: 2.5 ratio (ref 0.0–4.50)
Cholesterol: 125 mg/dL (ref 50–200)
HDL: 50 mg/dL (ref 36–68)
LDL Calculated: 15.2 mg/dL (ref 0.0–160.0)
Triglycerides: 299 mg/dL — ABNORMAL HIGH (ref 10–250)
VLDL: 59.8 mg/dL — ABNORMAL HIGH (ref 0–50)

## 2021-10-11 ENCOUNTER — Inpatient Hospital Stay: Payer: MEDICARE | Primary: Family Medicine

## 2021-10-11 ENCOUNTER — Ambulatory Visit: Admit: 2021-10-11 | Discharge: 2021-10-11 | Payer: MEDICARE | Attending: Family Medicine | Primary: Family Medicine

## 2021-10-11 DIAGNOSIS — R300 Dysuria: Secondary | ICD-10-CM

## 2021-10-11 LAB — MICROSCOPIC URINALYSIS
Epithelial Cells UA: 0 /HPF (ref 0–5)
RBC, UA: 5 /HPF (ref 0–4)
WBC, UA: 10 /HPF (ref 0–4)

## 2021-10-11 LAB — URINALYSIS WITH REFLEX TO CULTURE
Bilirubin Urine: NEGATIVE
Glucose, Ur: NEGATIVE mg/dL
Ketones, Urine: NEGATIVE mg/dL
Nitrite, Urine: POSITIVE — AB
Protein, UA: NEGATIVE mg/dL
Specific Gravity, UA: 1.03 — ABNORMAL HIGH (ref 1.010–1.025)
Urobilinogen, Urine: NORMAL EU/dL (ref 0.0–1.0)
pH, UA: 5.5 (ref 5.0–6.0)

## 2021-10-11 MED ORDER — CIPROFLOXACIN HCL 500 MG PO TABS
500 MG | ORAL_TABLET | Freq: Two times a day (BID) | ORAL | 0 refills | Status: AC
Start: 2021-10-11 — End: 2021-10-18

## 2021-10-11 NOTE — Progress Notes (Unsigned)
[facility]  [address] [place] [address]  [phone]      Katie Juarez is a 81 y.o. female who presents today for her medical conditions/complaints as noted below.  Katie Juarez is c/o of Dysuria (Burning with urination ) and Back Pain (Right lower)      HPI:     Pt here today for dysuria.    Dysuria ***    Has noticed a little more trouble with memory    Daughter is concerned about pt feeling fatigued all the time and wants to "sleep all the time".  Sleeps more in the afternoon.      Has a bronchoscopy scheduled for later this week at [facility] for the constant coughing and mucus production.  Daughter also thinks that she will be having EGD in the near future.  Still feels the coughing and spitting of mucus is "constant", but worse at night when she first lies down in bed.  Goes through 3/4 box of Kleenex per day.    Saw Ortho on 9/14 Remo Lipps Palte, PA) for back pain; was prescribed Medrol steroid pack, but pt does not remember if this helped with her back pain.  Will see Pain Mgmt again in 3 days to discuss possible epidural steroid injection.  Has to keep up with Tylenol/Ibuprofen through the day, but doesn't need anything through the night; will alternate the meds all day.    Will see Dr. Donivan Scull on 9/11 - no need for continued Neurology visits, as she did not feel pt's sx's were neurological.          Past Medical History:   Diagnosis Date    Basal cell carcinoma ([place]) of face 10/2018    excised by Dr Yehuda Savannah     Breast cancer [facility]) 1999    in remission    Constipation 12/27/2018    COPD (chronic obstructive pulmonary disease) ([place])     COVID-19 11/06/2020    DDD (degenerative disc disease)     Diverticulitis 06/28/2018    Ileus ([place]) 08/07/2021    Large bowel obstruction ([place]) 02/2015    [facility]    LBP (low back pain)     Pharyngoesophageal dysphagia     SS (spinal stenosis)       Past Surgical History:   Procedure Laterality Date     [place]    BREAST LUMPECTOMY Right 1999    CARDIAC CATHETERIZATION  12/21/2020    Dr Lowell Bouton    CHOLECYSTECTOMY  1990    EXCISION/BIOPSY Left 11/01/2018    Dr Yehuda Savannah / [name], for [place] Left 02/20/2015    strangulated with small bowel resection - [facility],  [facility], [place] ([place])  1980    Done for endometriosis; done in [place]  01/03/2019    VERTEBRAL AUMENTATION  L1      OTHER SURGICAL HISTORY  11/14/13, 06/23/11, 06/30/11, 07/12/11 12/29/11    L4/L5 IESI    OTHER SURGICAL HISTORY  12/17/2013    L5/S1 IESI    OTHER SURGICAL HISTORY Bilateral 05/30/2014    SIJ and piriformis    OTHER SURGICAL HISTORY Bilateral 09/16/2014    L3, L4, L5 Diagnostic Medial Branch Block    OTHER SURGICAL HISTORY Bilateral 09/26/2014    L5 TFE    OTHER SURGICAL  HISTORY  10/10/2014    bil L5 TFE    OTHER SURGICAL HISTORY  10/31/2014    caudal    SMALL INTESTINE SURGERY  02/20/2015    strangulated left femoral hernia    SPINE SURGERY N/A 01/03/2019    VERTEBRAL AUMENTATION  L1  (C-ARM X2, performed by Loura Back, MD at [facility] N/A 04/24/2019    EGD BIOPSY performed by Monna Fam, MD at [facility] OR     Family History   Problem Relation Age of Onset    Other Mother         ALS - diagnosed at age 65    Stroke Father         age 28    Cancer Sister         pt thinks it was liver    Cancer Brother         leukemia    Diabetes Maternal Grandfather     Other Brother         drowned    Other Brother         died at age 32; think due to spinal meningitis    Lupus Sister     Alzheimer's Disease Sister     Dementia Sister     Mult Sclerosis Nephew     Mult Sclerosis Daughter      Social History     Tobacco Use    Smoking status: Former     Packs/day: 1.00     Years: 40.00     Additional pack years: 0.00     Total pack years: 40.00     Types:  Cigarettes     Start date: 01/10/1953     Quit date: 01/11/1988     Years since quitting: 33.7    Smokeless tobacco: Never    Tobacco comments:     quit 25 years ago 1990   Substance Use Topics    Alcohol use: No     Alcohol/week: 0.0 standard drinks of alcohol      Current Outpatient Medications   Medication Sig Dispense Refill    ciprofloxacin (CIPRO) 500 MG tablet Take 1 tablet by mouth 2 times daily for 7 days 14 tablet 0    lisinopril (PRINIVIL;ZESTRIL) 10 MG tablet Take 1 tablet by mouth daily 90 tablet 3    TRELEGY ELLIPTA 100-62.5-25 MCG/ACT AEPB inhaler       aspirin 81 MG EC tablet Take 1 tablet by mouth daily 30 tablet 0    lactulose (CHRONULAC) 10 GM/15ML solution Take 15 mLs by mouth every evening 473 mL 0    EQ 8HR ARTHRITIS PAIN RELIEF 650 MG extended release tablet TAKE 2 TABLETS BY MOUTH EVERY 8 HOURS AS NEEDED FOR PAIN FOR FEVER      latanoprost (XALATAN) 0.005 % ophthalmic solution       omeprazole (PRILOSEC) 40 MG delayed release capsule       nitroGLYCERIN (NITROSTAT) 0.4 MG SL tablet Place 1 tablet under the tongue every 5 minutes as needed for Chest pain 25 tablet 3    citalopram (CELEXA) 20 MG tablet Take 1 tablet by mouth daily 90 tablet 3    atorvastatin (LIPITOR) 20 MG tablet Take 1 tablet by mouth daily 90 tablet 1    donepezil (ARICEPT) 5 MG tablet Take 1 tablet by mouth nightly 90 tablet 3    Probiotic Acidophilus (FLORANEX) TABS Take  1 tablet by mouth daily      polyethylene glycol (GLYCOLAX) 17 GM/SCOOP powder Take 17 g by mouth daily As needed.      alendronate (FOSAMAX) 70 MG tablet Take 1 tablet by mouth every 7 days 12 tablet 3    CRANBERRY PO Take by mouth      Cholecalciferol (VITAMIN D3) 50 MCG (2000 UT) CAPS Take by mouth      Handicap Placard MISC by Does not apply route Issue parking placard for person with disability; Applicant meets the qualifying disability criteria. Length of time expected to have disability.  Prescription expires in 5 years from issuing date. 1 each 0     CALCIUM CARBONATE-VITAMIN D PO Take 600 mg by mouth 2 times daily Contains 600 mg Calcium & 800 IU Vitamin D3.      Multiple Vitamins-Minerals (THERAPEUTIC MULTIVITAMIN-MINERALS) tablet Take 1 tablet by mouth daily      polyvinyl alcohol-povidone (HYPOTEARS) 1.4-0.6 % ophthalmic solution Place 1-2 drops into both eyes as needed       No current facility-administered medications for this visit.     Allergies   Allergen Reactions    Morphine Other (See Comments)     Night sweats,felt "loopy".    Pcn [Penicillins] Hives    Zithromax [Azithromycin] Hives    Adhesive Tape Rash       Health Maintenance   Topic Date Due    COVID-19 Vaccine (4 - Moderna series) 01/16/2020    Flu vaccine (1) 08/10/2021    Depression Screen  06/16/2022    Annual Wellness Visit (AWV)  06/16/2022    Lipids  10/08/2022    DTaP/Tdap/Td vaccine (2 - Td or Tdap) 09/28/2027    DEXA (modify frequency per FRAX score)  Completed    Shingles vaccine  Completed    Pneumococcal 65+ years Vaccine  Completed    Hepatitis A vaccine  Aged Out    Hepatitis B vaccine  Aged Out    Hib vaccine  Aged Out    Meningococcal (ACWY) vaccine  Aged Out       Subjective:      Review of Systems   Constitutional:  Negative for fever.   HENT:  Negative for trouble swallowing.    Cardiovascular:  Negative for leg swelling.   Gastrointestinal:  Positive for constipation (stable if she takes stool softener every day).   Genitourinary:  Positive for dysuria (improving; started a couple days ago) and urgency. Negative for frequency and hematuria.   Neurological:  Positive for tremors (head, hands).       Objective:     Vitals:    10/11/21 1129   BP: 106/68   Site: Right Upper Arm   Position: Sitting   Pulse: 52   Resp: 16   Temp: 97.5 F (36.4 C)   TempSrc: Temporal   SpO2: 97%   Weight: 135 lb 8 oz (61.5 kg)   Height: '5\' 4"'$  (1.626 m)     Physical Exam    Assessment:      1. Burning with urination  -     Urinalysis with Reflex to Culture; Future  -     ciprofloxacin (CIPRO) 500  MG tablet; Take 1 tablet by mouth 2 times daily for 7 days, Disp-14 tablet, R-0Normal  2. Lumbar pain with radiation down right leg  3. Hyperlipidemia, unspecified hyperlipidemia type  4. Mucus pooling in larynx  5. Chronic cough  6. Essential hypertension  Plan:      Pt will try cutting her Lisinopril in half to see if this helps with fatigue; will monitor BP at home with this change.      Return in about 2 months (around 12/11/2021) for f/u Pain Mgmt, Lughmani appt, BP.    Orders Placed This Encounter   Procedures    Urinalysis with Reflex to Culture     Standing Status:   Future     Number of Occurrences:   1     Standing Expiration Date:   10/12/2022     Order Specific Question:   SPECIFY(EX-CATH,MIDSTREAM,CYSTO,ETC)?     Answer:   mid stream     Orders Placed This Encounter   Medications    ciprofloxacin (CIPRO) 500 MG tablet     Sig: Take 1 tablet by mouth 2 times daily for 7 days     Dispense:  14 tablet     Refill:  0       Patient given educational materials - see patient instructions.  Discussed use, benefit, and side effects of prescribed medications.  All patient questions answered.  Pt voiced understanding.  Reviewed health maintenance.            Electronically signed by [name], DO, DO on 10/11/2021 at 12:00 PM

## 2021-10-13 LAB — CULTURE, URINE

## 2021-10-14 MED ORDER — NITROFURANTOIN MONOHYD MACRO 100 MG PO CAPS
100 MG | ORAL_CAPSULE | Freq: Two times a day (BID) | ORAL | 0 refills | Status: AC
Start: 2021-10-14 — End: 2021-10-21

## 2021-10-14 NOTE — Telephone Encounter (Signed)
Spoke with patient and advised patient of results and recommendations. Patient agreed with plan.

## 2021-10-14 NOTE — Telephone Encounter (Signed)
-----   Message from [name], DO sent at 10/14/2021 10:14 AM EDT -----  Urine culture shows 50-100,000 E coli.  Please inform pt - she was started on Cipro on 10/2, but susceptibility was not tested with Cipro and is resistant to Levaquin, so will need to change antibiotic.  Will send in Rx for Macrobid 100 mg BID x 7 days to pharmacy now and pt should start today and complete entire course; should f/u if sx's do not completely resolve.

## 2021-10-15 NOTE — Procedures (Signed)
Formatting of this note might be different from the original.  Images from the original note were not included.      [facility]  Alphonzo Cruise, MD  Blanchard Mane, MD \ Sheral Flow, MD \ Chrissie Noa MD \ Willy Eddy MD  Silas Sacramento APRN NP-C \ Caroll Rancher APRN NP-C \ Dellis Filbert APRN NP-C    Bronchoscopy operative report    Procedure:  Bronchoscopy under moderate sedation with airway inspection and right middle lobe, lingula BAL    Indication:  Chronic cough    Pre-procedure diagnosis:  Chronic cough  Postprocedural diagnosis:  Same    Informed consent:  Obtained  Time-out performed    Physician: Hermine Messick  First assistant:  None    Sedation:  Moderate sedation, Versed 2 mg, fentanyl 75 mcg, 6 minutes dedicated to sedation time    Airway:  Right nasal passage    Description:  Right nasal passage anesthetized using lidocaine.  Bronchoscope advanced through the right nasal passage without difficulty.  Epiglottis larynx visually inspected.  A total of 8 mL 1% lidocaine was administered to the larynx and central airways to anesthetize the airways.  Larynx and vocal cords completely normal with normal vocal cord function.  Bronchoscope advanced into the trachea.  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  No evidence of purulent secretions, erythema, mucosal irregularity or malignancy.  Dedicated BAL performed on the right middle lobe and lingula separately.  Both samples will be sent for analysis.  I did update the patient's son following the procedure who is a respiratory therapist.  He was concerned about chronic right hemidiaphragm elevation and potential GI etiology for her cough.  Defer to the primary pulmonary physician.  I did speak directly to Dr. Lynnell Catalan after the procedure.    Estimated blood loss:  No blood loss    Complications:  No complications    Blanchard Mane, MD  Pulmonary and  Critical Care Medicine  [phone] (cell)  [phone] (office)  [phone] (answering service) [employer]  [phone] (fax)    Electronically signed by Peterson Ao, MD at 10/15/2021  1:14 PM EDT

## 2021-10-15 NOTE — Consults (Signed)
Formatting of this note might be different from the original.  Images from the original note were not included.  No changes since the patient was previously seen.  Here for bronchoscopy.    Patient has been complaining of cough persisting now for 6 months.  She was sent to me by Dr. Lynnell Catalan simply to do an airway inspection and bilateral BAL.  I am told that imaging at [facility] center is not revealing for anything acute.  I spoke with the patient and her son who is a respiratory therapist in the preoperative area and had her sign a consent.  We will proceed with MAC sedation.    Mallampati 2, ASA 1    Katie Mane, MD  Pulmonary and Critical Care Medicine  [phone] (cell)  [phone] (office)  [phone] (answering service) [employer]  [phone] (fax)    Electronically signed by Peterson Ao, MD at 10/15/2021 12:31 PM EDT

## 2021-10-18 ENCOUNTER — Encounter

## 2021-10-18 NOTE — Telephone Encounter (Signed)
Per [facility], last fill 7/3, quantity 90 for 90 days.     [facility] called requesting a refill of the below medication which has been pended for you:     Requested Prescriptions     Pending Prescriptions Disp Refills    LORazepam (ATIVAN) 0.5 MG tablet 90 tablet 0     Sig: Take 1 tablet by mouth nightly for 90 days. Max Daily Amount: 0.5 mg       Last Appointment Date: 10/11/2021  Next Appointment Date: 12/14/2021    Allergies   Allergen Reactions    Morphine Other (See Comments)     Night sweats,felt "loopy".    Pcn [Penicillins] Hives    Zithromax [Azithromycin] Hives    Adhesive Tape Rash

## 2021-10-20 NOTE — Telephone Encounter (Signed)
ERROR

## 2021-10-20 NOTE — Telephone Encounter (Signed)
Controlled Substance Monitoring:    Acute and Chronic Pain Monitoring:   RX Monitoring Periodic Controlled Substance Monitoring Chronic Pain > 80 MEDD   10/20/2021   8:53 PM No signs of potential drug abuse or diversion identified. Obtained or confirmed "Medication Contract" on file.

## 2021-10-21 MED ORDER — LORAZEPAM 0.5 MG PO TABS
0.5 MG | ORAL_TABLET | Freq: Every evening | ORAL | 0 refills | Status: DC
Start: 2021-10-21 — End: 2022-01-17

## 2021-10-21 NOTE — Patient Instructions (Signed)
+

## 2021-11-16 LAB — URINALYSIS WITH MICROSCOPIC
Bilirubin Urine: NEGATIVE
Casts UA: ABSENT /LPF
Crystals, UA: ABSENT
Glucose, Ur: NEGATIVE mg/dL
Ketones, Urine: NEGATIVE mg/dL
Nitrite, Urine: NEGATIVE
Specific Gravity, Urine: 1.03 mg/dL (ref 1.005–1.030)
Urobilinogen, Urine: 0.2 mg/dL (ref 0.2–1.0)
pH, UA: 5.5 (ref 5.0–8.5)

## 2021-11-19 ENCOUNTER — Encounter

## 2021-11-19 NOTE — Telephone Encounter (Signed)
error 

## 2021-12-06 ENCOUNTER — Encounter: Payer: MEDICARE | Attending: Internal Medicine | Primary: Family Medicine

## 2021-12-14 ENCOUNTER — Ambulatory Visit: Admit: 2021-12-14 | Discharge: 2021-12-14 | Payer: MEDICARE | Attending: Family Medicine | Primary: Family Medicine

## 2021-12-14 DIAGNOSIS — I10 Essential (primary) hypertension: Secondary | ICD-10-CM

## 2021-12-14 DIAGNOSIS — D72829 Elevated white blood cell count, unspecified: Secondary | ICD-10-CM

## 2021-12-14 NOTE — Progress Notes (Signed)
[facility]  [address] [place] [address]  [id number]      Katie Juarez is a 81 y.o. female who presents today for her medical conditions/complaints as noted below.  Katie Juarez is c/o of Back Pain (2 mo follow up )      HPI:     Pt here today for follow-up of productive cough and fatigue.    Inhalers do not seem to help since starting this.  Using Anoro 1 puff daily right now; not using any further nebulizer treatments.  Switched from Trelegy due to cost.  Will see Dr. Lynnell Catalan tomorrow for follow-up.    Taking Omeprazole 40 mg BID per Dr. Reginal Lutes - unsure if it helps more to take twice.  Doesn't think she is coughing as much at night and feels like the phlegm production has been a "little better" recently.    Will see Dr. Reginal Lutes [facility]) on 12/27/21.    Will see Cardiology for follow-up on 12/11.  Stopped Lisinopril after last OV  Checking BP at home - usually around 135/70's.  Does not feel any different.    Injections in her back have really helped with back pain.  Only has to take Tylenol 1-2x's daily as needed.    Started taking Emergen-C gummies which seems to help with energy/fatigue; also taking Vit D3 once daily.  Seems to be more tired still during the day.    Usually takes OTC stool softener daily for constipation.  Had cramps the other day after taking dose, but wonders if she might have accidentally doubled her dose.  Moving her bowels once daily most days.        Sees pain management for back pain. Taking tylenol prn. She has noticed improvement on this regimen. Had an injection L4-L5 area. Pain management told her she only needs to return if needing another injection. She completed PT 2 weeks ago.     Takes Lisinopril as needed. Checks blood pressure once a week with systolic ~712. Will take lisinopril if her blood pressure is high and hasn't needed to take it in a while.    Patient states she is still feeling fatigued. Reports trying  Emergen-C and prednisone with improvement in energy. States she has difficulty sleeping at night due to coughing/spitting up phlegm. She is seeing Dr. Lynnell Catalan tomorrow to discuss results of upper and lower GI study. Dr. Lynnell Catalan prescribed omeprazole, patient has not noticed improvement with this. Admits coughing during the day as well. Previously tried inhalers with no improvement.     Patient states she takes a stool softener every night for constipation. Reports possibly taking a double dose of the stool softener a couple days ago with cramping. This has resolved since.         Past Medical History:   Diagnosis Date    Basal cell carcinoma (BCC) of face 10/2018    excised by Dr Yehuda Savannah     Breast cancer [facility]) 1999    in remission    Constipation 12/27/2018    COPD (chronic obstructive pulmonary disease) ([place])     COVID-19 11/06/2020    DDD (degenerative disc disease)     Diverticulitis 06/28/2018    Ileus ([place]) 08/07/2021    Large bowel obstruction ([place]) 02/2015    [facility]    LBP (low back pain)     Pharyngoesophageal dysphagia     SS (spinal stenosis)  Past Surgical History:   Procedure Laterality Date    APPENDECTOMY  1956    BLADDER SUSPENSION  1990    BREAST LUMPECTOMY Right 1999    CARDIAC CATHETERIZATION  12/21/2020    Dr Lowell Bouton    CHOLECYSTECTOMY  1990    EXCISION/BIOPSY Left 11/01/2018    Dr Yehuda Savannah / [name], for [place] Left 02/20/2015    strangulated with small bowel resection - [facility],  [facility], [place] ([place])  1980    Done for endometriosis; done in [place]  01/03/2019    VERTEBRAL AUMENTATION  L1      OTHER SURGICAL HISTORY  11/14/13, 06/23/11, 06/30/11, 07/12/11 12/29/11    L4/L5 IESI    OTHER SURGICAL HISTORY  12/17/2013    L5/S1 IESI    OTHER SURGICAL HISTORY Bilateral 05/30/2014    SIJ and piriformis    OTHER SURGICAL HISTORY Bilateral 09/16/2014    L3, L4, L5 Diagnostic Medial Branch Block     OTHER SURGICAL HISTORY Bilateral 09/26/2014    L5 TFE    OTHER SURGICAL HISTORY  10/10/2014    bil L5 TFE    OTHER SURGICAL HISTORY  10/31/2014    caudal    SMALL INTESTINE SURGERY  02/20/2015    strangulated left femoral hernia    SPINE SURGERY N/A 01/03/2019    VERTEBRAL AUMENTATION  L1  (C-ARM X2, performed by Loura Back, MD at [place] N/A 04/24/2019    EGD BIOPSY performed by Monna Fam, MD at [place] History   Problem Relation Age of Onset    Other Mother         ALS - diagnosed at age 31    Stroke Father         age 99    Cancer Sister         pt thinks it was liver    Cancer Brother         leukemia    Diabetes Maternal Grandfather     Other Brother         drowned    Other Brother         died at age 79; think due to spinal meningitis    Lupus Sister     Alzheimer's Disease Sister     Dementia Sister     Mult Sclerosis Nephew     Mult Sclerosis Daughter      Social History     Tobacco Use    Smoking status: Former     Packs/day: 1.00     Years: 40.00     Additional pack years: 0.00     Total pack years: 40.00     Types: Cigarettes     Start date: 01/10/1953     Quit date: 01/11/1988     Years since quitting: 33.9    Smokeless tobacco: Never    Tobacco comments:     quit 25 years ago 1990   Substance Use Topics    Alcohol use: No     Alcohol/week: 0.0 standard drinks of alcohol      Current Outpatient Medications   Medication Sig Dispense Refill    LORazepam (ATIVAN) 0.5 MG tablet Take 1 tablet by mouth nightly for 90 days. Max Daily Amount: 0.5 mg 90 tablet 0  aspirin 81 MG EC tablet Take 1 tablet by mouth daily 30 tablet 0    EQ 8HR ARTHRITIS PAIN RELIEF 650 MG extended release tablet TAKE 2 TABLETS BY MOUTH EVERY 8 HOURS AS NEEDED FOR PAIN FOR FEVER      latanoprost (XALATAN) 0.005 % ophthalmic solution       omeprazole (PRILOSEC) 40 MG delayed release capsule       nitroGLYCERIN (NITROSTAT) 0.4 MG SL tablet Place 1 tablet  under the tongue every 5 minutes as needed for Chest pain 25 tablet 3    citalopram (CELEXA) 20 MG tablet Take 1 tablet by mouth daily 90 tablet 3    donepezil (ARICEPT) 5 MG tablet Take 1 tablet by mouth nightly 90 tablet 3    polyethylene glycol (GLYCOLAX) 17 GM/SCOOP powder Take 17 g by mouth daily As needed.      alendronate (FOSAMAX) 70 MG tablet Take 1 tablet by mouth every 7 days 12 tablet 3    CRANBERRY PO Take by mouth      Cholecalciferol (VITAMIN D3) 50 MCG (2000 UT) CAPS Take by mouth      CALCIUM CARBONATE-VITAMIN D PO Take 600 mg by mouth 2 times daily Contains 600 mg Calcium & 800 IU Vitamin D3.      Multiple Vitamins-Minerals (THERAPEUTIC MULTIVITAMIN-MINERALS) tablet Take 1 tablet by mouth daily      polyvinyl alcohol-povidone (HYPOTEARS) 1.4-0.6 % ophthalmic solution Place 1-2 drops into both eyes as needed      atorvastatin (LIPITOR) 20 MG tablet Take 1 tablet by mouth daily 90 tablet 2    ANORO ELLIPTA 62.5-25 MCG/ACT inhaler Inhale 1 puff into the lungs daily      Handicap Placard MISC by Does not apply route Issue parking placard for person with disability; Applicant meets the qualifying disability criteria. Length of time expected to have disability.  Prescription expires in 5 years from issuing date. 1 each 0     No current facility-administered medications for this visit.     Allergies   Allergen Reactions    Morphine Other (See Comments)     Night sweats,felt "loopy".    Pcn [Penicillins] Hives    Zithromax [Azithromycin] Hives    Adhesive Tape Rash       Health Maintenance   Topic Date Due    Respiratory Syncytial Virus (RSV) age 5 yrs+ (14 - 35-dose 60+ series) Never done    COVID-19 Vaccine (5 - 2023-24 season) 12/02/2021    Depression Screen  06/16/2022    Annual Wellness Visit (AWV)  06/16/2022    Lipids  10/08/2022    DTaP/Tdap/Td vaccine (2 - Td or Tdap) 09/28/2027    DEXA (modify frequency per FRAX score)  Completed    Flu vaccine  Completed    Shingles vaccine  Completed     Pneumococcal 65+ years Vaccine  Completed    Hepatitis A vaccine  Aged Out    Hepatitis B vaccine  Aged Out    Hib vaccine  Aged Out    Meningococcal (ACWY) vaccine  Aged Out       Subjective:      Review of Systems   Constitutional:  Positive for fatigue.   Respiratory:  Positive for cough (improved slightly).    Neurological:  Positive for tremors (stable, head/hands).       Objective:     Vitals:    12/14/21 1143   BP: 104/68   Site: Right Upper Arm   Position: Sitting  Cuff Size: Medium Adult   Pulse: 54   SpO2: 96%   Weight: 60.3 kg (133 lb)   Height: 1.626 m ('5\' 4"'$ )     Physical Exam  Vitals and nursing note reviewed.   Constitutional:       General: She is not in acute distress.     Appearance: She is well-developed.   HENT:      Head: Normocephalic and atraumatic.      Right Ear: Tympanic membrane, ear canal and external ear normal.      Left Ear: Tympanic membrane, ear canal and external ear normal.      Nose: Nose normal.      Mouth/Throat:      Mouth: Mucous membranes are moist.      Pharynx: Oropharynx is clear. No posterior oropharyngeal erythema.   Eyes:      Conjunctiva/sclera: Conjunctivae normal.   Cardiovascular:      Rate and Rhythm: Normal rate and regular rhythm.      Heart sounds: Normal heart sounds.   Pulmonary:      Effort: Pulmonary effort is normal. No respiratory distress.      Breath sounds: Normal breath sounds.   Abdominal:      General: Bowel sounds are normal. There is no distension.      Palpations: Abdomen is soft.      Tenderness: There is no abdominal tenderness.   Musculoskeletal:      Cervical back: Neck supple.   Skin:     General: Skin is warm and dry.   Neurological:      Mental Status: She is alert and oriented to person, place, and time.         Assessment:      1. Essential hypertension  -     Basic Metabolic Panel; Future  2. Mucus pooling in larynx  3. Chronic cough  4. Anxiety  5. Leukocytosis, unspecified type  -     CBC with Auto Differential; Future         Plan:       Return in about 5 months (around 05/02/2022) for f/u cough, HTN, labs.    Orders Placed This Encounter   Procedures    CBC with Auto Differential     Standing Status:   Future     Standing Expiration Date:   53/61/4431    Basic Metabolic Panel     Standing Status:   Future     Standing Expiration Date:   01/10/2023       Patient given educational materials - see patient instructions.  Discussed use, benefit, and side effects of prescribed medications.  All patient questions answered.  Pt voiced understanding.  Reviewed health maintenance.            Electronically signed by [name], DO, DO on 12/21/2021 at 11:28 AM

## 2021-12-15 ENCOUNTER — Encounter

## 2021-12-15 NOTE — Telephone Encounter (Signed)
Fax received requesting for 90 day supply

## 2021-12-16 NOTE — Telephone Encounter (Signed)
Please confirm - has she been taking this whole time, and is she tolerating?  If so, then I would recommend we continue.

## 2021-12-16 NOTE — Telephone Encounter (Signed)
[facility] called requesting a refill of the below medication which has been pended for you: Patient wants to know if Dr. Renard Hamper wants her to take this med, if yes, send refill.     Requested Prescriptions     Pending Prescriptions Disp Refills    atorvastatin (LIPITOR) 20 MG tablet 90 tablet 1     Sig: Take 1 tablet by mouth daily       Last Appointment Date: 12/14/2021  Next Appointment Date: 05/18/2022    Allergies   Allergen Reactions    Morphine Other (See Comments)     Night sweats,felt "loopy".    Pcn [Penicillins] Hives    Zithromax [Azithromycin] Hives    Adhesive Tape Rash

## 2021-12-17 MED ORDER — ATORVASTATIN CALCIUM 20 MG PO TABS
20 MG | ORAL_TABLET | Freq: Every day | ORAL | 2 refills | Status: DC
Start: 2021-12-17 — End: 2022-12-26

## 2021-12-17 NOTE — Telephone Encounter (Signed)
Since she was tolerating well when taking and last LP was at goal, other than high TG's, will refill and have her continue at this time.  Med sent to pharmacy now.

## 2021-12-17 NOTE — Telephone Encounter (Signed)
Patient reports she was taking it until she ran out, she isn't sure why she didn't get a refill after she ran out. She was tolerating it well when she was taking it.

## 2021-12-17 NOTE — Telephone Encounter (Signed)
Patient notified that Dr. Renard Hamper wants her to continue taking her atorvastatin and it was just sent to [facility] in [place].  Patient reports understanding.

## 2021-12-20 ENCOUNTER — Ambulatory Visit: Admit: 2021-12-20 | Discharge: 2021-12-20 | Payer: MEDICARE | Attending: Internal Medicine | Primary: Family Medicine

## 2021-12-20 DIAGNOSIS — R9431 Abnormal electrocardiogram [ECG] [EKG]: Secondary | ICD-10-CM

## 2021-12-20 NOTE — Progress Notes (Signed)
Cardiology Consultation/Follow Up.  [place]  01/18/1940  [id number]    Today: 12/20/21    CC: Patient is here for follow up for HTN    HPI:   Katie Juarez is here for follow up for HTN.   BP has been running low.   In the 784O systolic.   Felt tired and lethargic as well.   Has not been taking any BP recently.     Past Medical:  Past Medical History:   Diagnosis Date    Basal cell carcinoma ([place]) of face 10/2018    excised by Dr Yehuda Savannah     Breast cancer [facility]) 1999    in remission    Constipation 12/27/2018    COPD (chronic obstructive pulmonary disease) ([place])     COVID-19 11/06/2020    DDD (degenerative disc disease)     Diverticulitis 06/28/2018    Ileus ([place]) 08/07/2021    Large bowel obstruction ([place]) 02/2015    [facility]    LBP (low back pain)     Pharyngoesophageal dysphagia     SS (spinal stenosis)          Past Surgical:  Past Surgical History:   Procedure Laterality Date    [place]    BREAST LUMPECTOMY Right 1999    CARDIAC CATHETERIZATION  12/21/2020    Dr Lowell Bouton    CHOLECYSTECTOMY  1990    EXCISION/BIOPSY Left 11/01/2018    Dr Yehuda Savannah / [name], for [place] Left 02/20/2015    strangulated with small bowel resection - [facility],  [facility], [place] ([place])  [place] for endometriosis; done in [place]  01/03/2019    [place]  11/14/13, 06/23/11, 06/30/11, 07/12/11 12/29/11    L4/L5 IESI    OTHER SURGICAL HISTORY  12/17/2013    L5/S1 IESI    OTHER SURGICAL HISTORY Bilateral 05/30/2014    SIJ and piriformis    OTHER SURGICAL HISTORY Bilateral 09/16/2014    L3, L4, L5 Diagnostic Medial Branch Block    OTHER SURGICAL HISTORY Bilateral 09/26/2014    L5 TFE    OTHER SURGICAL HISTORY  10/10/2014    bil L5 TFE    OTHER SURGICAL HISTORY  10/31/2014    caudal    SMALL INTESTINE SURGERY  02/20/2015     strangulated left femoral hernia    SPINE SURGERY N/A 01/03/2019    VERTEBRAL AUMENTATION  L1  (C-ARM X2, performed by Loura Back, MD at [place] N/A 04/24/2019    EGD BIOPSY performed by Monna Fam, MD at [facility] OR         Family History:  Family History   Problem Relation Age of Onset    Other Mother         ALS - diagnosed at age 75    Stroke Father         age 23    Cancer Sister         pt thinks it was liver    Cancer Brother         leukemia    Diabetes Maternal Grandfather     Other Brother  drowned    Other Brother         died at age 51; think due to spinal meningitis    Lupus Sister     Alzheimer's Disease Sister     Dementia Sister     Mult Sclerosis Nephew     Mult Sclerosis Daughter        Social History:  Social History     Socioeconomic History    Marital status: Widowed     Spouse name: Not on file    Number of children: Not on file    Years of education: Not on file    Highest education level: Not on file   Occupational History    Not on file   Tobacco Use    Smoking status: Former     Packs/day: 1.00     Years: 40.00     Additional pack years: 0.00     Total pack years: 40.00     Types: Cigarettes     Start date: 01/10/1953     Quit date: 01/11/1988     Years since quitting: 33.9    Smokeless tobacco: Never    Tobacco comments:     quit 25 years ago 1990   Vaping Use    Vaping Use: Never used   Substance and Sexual Activity    Alcohol use: No     Alcohol/week: 0.0 standard drinks of alcohol    Drug use: Never    Sexual activity: Not Currently   Other Topics Concern    Not on file   Social History Narrative    Not on file     Social Determinants of Health     Financial Resource Strain: Low Risk  (04/13/2021)    Overall Financial Resource Strain (CARDIA)     Difficulty of Paying Living Expenses: Not hard at all   Food Insecurity: Not on file (04/13/2021)   Transportation Needs: Unknown (04/13/2021)    PRAPARE - Actuary (Medical): Not on file     Lack of Transportation (Non-Medical): No   Physical Activity: Insufficiently Active (06/15/2021)    Exercise Vital Sign     Days of Exercise per Week: 1 day     Minutes of Exercise per Session: 60 min   Stress: Not on file   Social Connections: Not on file   Intimate Partner Violence: Not on file   Housing Stability: Unknown (04/13/2021)    Housing Stability Vital Sign     Unable to Pay for Housing in the Last Year: Not on file     Number of [place] in the Last Year: Not on file     Unstable Housing in the Last Year: No        REVIEW OF SYSTEMS:    Constitutional: there has been no unanticipated weight loss. There's been No change in energy level, No change in activity level.     Eyes: No visual changes or diplopia. No scleral icterus.  ENT: No Headaches, hearing loss or vertigo. No mouth sores or sore throat.  Cardiovascular: AS HPI  Respiratory: AS HPI  Gastrointestinal: No abdominal pain, appetite loss, blood in stools. No change in bowel or bladder habits.  Genitourinary: No dysuria, trouble voiding, or hematuria.  Musculoskeletal:  No gait disturbance, No weakness or joint complaints.  Integumentary: No rash or pruritis.  Neurological: No headache, diplopia, change in muscle strength, numbness or tingling. No change in gait, balance, coordination, mood,  affect, memory, mentation, behavior.  Psychiatric: No new anxiety or depression.  Endocrine: No temperature intolerance. No excessive thirst, fluid intake, or urination. No tremor.  Hematologic/Lymphatic: No abnormal bruising or bleeding, blood clots or swollen lymph nodes.  Allergic/Immunologic: No nasal congestion or hives.    Medications:    Current Outpatient Medications:     atorvastatin (LIPITOR) 20 MG tablet, Take 1 tablet by mouth daily, Disp: 90 tablet, Rfl: 2    ANORO ELLIPTA 62.5-25 MCG/ACT inhaler, Inhale 1 puff into the lungs daily, Disp: , Rfl:     LORazepam (ATIVAN) 0.5 MG tablet, Take 1 tablet by  mouth nightly for 90 days. Max Daily Amount: 0.5 mg, Disp: 90 tablet, Rfl: 0    aspirin 81 MG EC tablet, Take 1 tablet by mouth daily, Disp: 30 tablet, Rfl: 0    EQ 8HR ARTHRITIS PAIN RELIEF 650 MG extended release tablet, TAKE 2 TABLETS BY MOUTH EVERY 8 HOURS AS NEEDED FOR PAIN FOR FEVER, Disp: , Rfl:     latanoprost (XALATAN) 0.005 % ophthalmic solution, , Disp: , Rfl:     omeprazole (PRILOSEC) 40 MG delayed release capsule, , Disp: , Rfl:     nitroGLYCERIN (NITROSTAT) 0.4 MG SL tablet, Place 1 tablet under the tongue every 5 minutes as needed for Chest pain, Disp: 25 tablet, Rfl: 3    citalopram (CELEXA) 20 MG tablet, Take 1 tablet by mouth daily, Disp: 90 tablet, Rfl: 3    donepezil (ARICEPT) 5 MG tablet, Take 1 tablet by mouth nightly, Disp: 90 tablet, Rfl: 3    polyethylene glycol (GLYCOLAX) 17 GM/SCOOP powder, Take 17 g by mouth daily As needed., Disp: , Rfl:     alendronate (FOSAMAX) 70 MG tablet, Take 1 tablet by mouth every 7 days, Disp: 12 tablet, Rfl: 3    CRANBERRY PO, Take by mouth, Disp: , Rfl:     Cholecalciferol (VITAMIN D3) 50 MCG (2000 UT) CAPS, Take by mouth, Disp: , Rfl:     Handicap Placard MISC, by Does not apply route Issue parking placard for person with disability; Applicant meets the qualifying disability criteria. Length of time expected to have disability.  Prescription expires in 5 years from issuing date., Disp: 1 each, Rfl: 0    CALCIUM CARBONATE-VITAMIN D PO, Take 600 mg by mouth 2 times daily Contains 600 mg Calcium & 800 IU Vitamin D3., Disp: , Rfl:     Multiple Vitamins-Minerals (THERAPEUTIC MULTIVITAMIN-MINERALS) tablet, Take 1 tablet by mouth daily, Disp: , Rfl:     polyvinyl alcohol-povidone (HYPOTEARS) 1.4-0.6 % ophthalmic solution, Place 1-2 drops into both eyes as needed, Disp: , Rfl:      Physical Exam:   Vitals: BP (!) 144/80   Pulse 60   Ht 1.626 m ('5\' 4"'$ )   Wt 60.3 kg (133 lb)   LMP  (LMP Unknown)   BMI 22.83 kg/m   General appearance: alert and cooperative with  exam  HEENT: Head: Normocephalic, no lesions, without obvious abnormality.  Neck: no carotid bruit, no JVD  Lungs: clear to auscultation bilaterally  Heart:  regular rate and rhythm, S1, S2 normal, no Murmur  Abdomen: soft, non-tender; bowel sounds normal; no masses,  no organomegaly  Extremities: no site injection hematoma, extremities normal, atraumatic, no cyanosis. no edema  Neurologic: Mental status: Alert, oriented, thought content appropriate    Labs:  Lab Results   Component Value Date    CHOL 125 10/07/2021    TRIG 299 (H) 10/07/2021    HDL  50 10/07/2021    LDLCHOLESTEROL 55 08/23/2021    LDLCALC 15.2 10/07/2021    VLDL 59.800 (H) 10/07/2021    CHOLHDLRATIO 2.50 10/07/2021       Lab Results   Component Value Date    NA 138 08/23/2021    K 4.2 08/23/2021    CL 104 08/23/2021    CO2 24 08/23/2021    BUN 24 (H) 08/23/2021    CREATININE 0.7 08/23/2021    GLUCOSE 98 08/23/2021    CALCIUM 9.3 08/23/2021    PROT 7.2 08/22/2021    LABALBU 4.7 08/22/2021    BILITOT 0.4 08/22/2021    ALKPHOS 46 08/22/2021    AST 13 08/22/2021    ALT 11 08/22/2021    LABGLOM >60 08/23/2021    GFRAA >60 06/15/2020    AGRATIO 1.4 09/27/2017    GLOB 2.8 08/07/2021       EKG:   Sinus bradycardia   Otherwise normal ECG    Cardiac Cath  12/2020    Minimal CAD   D1: proximal 70% but small branch   Preserved LV systolic function    Echo 08/23/21    Left Ventricle: Normal left ventricular systolic function with a visually estimated EF of 60 - 65%. EF by 2D Simpsons Biplane is 65%. Left ventricle size is normal. Mildly increased wall thickness. Diastolic dysfunction present with normal LV EF. Normal left ventricular filling pressure.    Aortic Valve: Trileaflet valve. Mild sclerosis of the aortic valve cusp. Mild regurgitation with a centrally directed jet.    Mitral Valve: Valve structure is normal. Mild annular calcification of the mitral valve. Mild regurgitation with an anterior directed jet.    Tricuspid Valve: Mild regurgitation with a  centrally directed jet. The estimated RVSP is 41 mmHg. Mildly elevated RVSP.    Past Medical and Surgical History, Problem List, Allergies, Medications, Labs, Imaging, all reviewed extensively in EMR and with the patient.    Assessment:  - HTN- was in ER with hypertension but that was brought on by back pain. Now BP lower and was taking off BP meds due to hypotension/fatigue/lethargy. BP in office higher but few days ago was 151 systolic.   - CP - resolved  - Non obs CAD Cath 12/22 with D1- 70% but small  - SOB- resolved     Plan:  - she wants to stay off bp meds, okay for now, monitor BP regularly, call if elevated.   - continue ASA, statin    The patient is to continue heart healthy diet, weight loss and exercise as tolerated. Patient's medications and side effects were discussed. Medication refills were provided if needed. Follow up appointment timing was discussed. All questions and concerns were addressed to patient's satisfaction.     The patient is to follow up in 6 months or sooner if necessary.     Thank you for allowing me to participate in the care of this patient, please do not hesitate to call if you have any questions.    Clearnce Hasten, [facility]  [email or web address]  [phone]

## 2021-12-28 ENCOUNTER — Ambulatory Visit: Admit: 2021-12-28 | Discharge: 2021-12-28 | Payer: MEDICARE | Attending: Family Medicine | Primary: Family Medicine

## 2021-12-28 ENCOUNTER — Encounter

## 2021-12-28 ENCOUNTER — Inpatient Hospital Stay: Payer: MEDICARE | Primary: Family Medicine

## 2021-12-28 DIAGNOSIS — R531 Weakness: Secondary | ICD-10-CM

## 2021-12-28 LAB — COMPREHENSIVE METABOLIC PANEL
ALT: 10 U/L (ref 5–33)
AST: 16 U/L (ref <32)
Albumin/Globulin Ratio: 1.4 (ref 1.0–2.5)
Albumin: 4.3 g/dL (ref 3.5–5.2)
Alkaline Phosphatase: 67 U/L (ref 35–104)
Anion Gap: 12 mmol/L (ref 9–17)
BUN: 21 mg/dL (ref 8–23)
Bun/Cre Ratio: 30 — ABNORMAL HIGH (ref 9–20)
CO2: 27 mmol/L (ref 20–31)
Calcium: 10 mg/dL (ref 8.6–10.4)
Chloride: 106 mmol/L (ref 98–107)
Creatinine: 0.7 mg/dL (ref 0.5–0.9)
Est, Glom Filt Rate: >60 mL/min/{1.73_m2} (ref >60)
Glucose: 83 mg/dL (ref 70–99)
Potassium: 4.1 mmol/L (ref 3.7–5.3)
Sodium: 145 mmol/L — ABNORMAL HIGH (ref 135–144)
Total Bilirubin: 0.3 mg/dL (ref 0.3–1.2)
Total Protein: 7.3 g/dL (ref 6.4–8.3)

## 2021-12-28 LAB — CBC WITH AUTO DIFFERENTIAL
Absolute Immature Granulocyte: 0.2 10*3/uL (ref 0.00–0.30)
Basophils %: 1 % (ref 0–2)
Basophils Absolute: 0.14 10*3/uL (ref 0.00–0.20)
Eosinophils %: 3 % (ref 1–4)
Eosinophils Absolute: 0.35 10*3/uL (ref 0.00–0.44)
Hematocrit: 41.3 % (ref 36.3–47.1)
Hemoglobin: 13.4 g/dL (ref 11.9–15.1)
Immature Granulocytes: 2 % — ABNORMAL HIGH
Lymphocytes %: 14 % — ABNORMAL LOW (ref 24–43)
Lymphocytes Absolute: 1.61 10*3/uL (ref 1.10–3.70)
MCH: 31.2 pg (ref 25.2–33.5)
MCHC: 32.4 g/dL (ref 25.2–33.5)
MCV: 96.3 fL (ref 82.6–102.9)
MPV: 10.2 fL (ref 8.1–13.5)
Monocytes %: 11 % (ref 3–12)
Monocytes Absolute: 1.31 10*3/uL — ABNORMAL HIGH (ref 0.10–1.20)
NRBC Automated: 0 per 100 WBC
Neutrophils %: 69 % — ABNORMAL HIGH (ref 36–65)
Neutrophils Absolute: 8.24 10*3/uL — ABNORMAL HIGH (ref 1.50–8.10)
Platelets: 244 10*3/uL (ref 138–453)
RBC: 4.29 m/uL (ref 3.95–5.11)
RDW: 13.2 % (ref 11.8–14.4)
WBC: 11.9 10*3/uL — ABNORMAL HIGH (ref 3.5–11.3)

## 2021-12-28 LAB — POCT COVID-19 & INFLUENZA A/B
Influenza A Antigen, POC: NEGATIVE
Influenza B Antigen, POC: NEGATIVE
Lot/Kit Number: 3216617
SARS-COV-2, POC: NOT DETECTED

## 2021-12-28 LAB — URINALYSIS WITH REFLEX TO CULTURE
Bilirubin Urine: NEGATIVE
Glucose, Ur: NEGATIVE mg/dL
Ketones, Urine: NEGATIVE mg/dL
Leukocyte Esterase, Urine: NEGATIVE
Nitrite, Urine: NEGATIVE
Protein, UA: NEGATIVE mg/dL
Specific Gravity, UA: 1.02 (ref 1.010–1.025)
Urine Hgb: NEGATIVE
Urobilinogen, Urine: NORMAL EU/dL (ref 0.0–1.0)
pH, UA: 6 (ref 5.0–6.0)

## 2021-12-28 LAB — TSH WITH REFLEX: TSH: 0.7 u[IU]/mL (ref 0.30–5.00)

## 2021-12-28 NOTE — Patient Instructions (Signed)
Stop Lipitor and iron.    Let Dr. Renard Hamper know if your symptoms do not improve after stopping Lipitor.

## 2021-12-28 NOTE — Progress Notes (Signed)
[facility]             [address], [place], [place]                        Telephone [phone]             Fax [phone]       Katie Juarez  DOB:  21-Feb-1940  Age:  81 y.o.   MRN:  [id number]  Date of visit:  12/28/2021     Assessment and Plan:    1. Weak  2. Frequent urination  I reviewed the results of testing done today with the patient and her daughter.  I recommended that she drink adequate fluids.   I recommended meal supplements, such as Boost or Ensure.  I recommended that she follow up with her PCP if her symptoms continue.      Subjective:    Katie Juarez is a 81 y.o. female who presents to [facility] today (12/28/2021) for evaluation of:  Fatigue (Weakness, fatigue and shaking for 3-4 days.)        She states that she had diarrhea last week.   The diarrhea resolved, but now she feels very weak and shaky.  She states that she has been eating, but her appetite is decreased.     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    Pain of both sacroiliac joints    Piriformis syndrome of left side    Lumbar radicular pain    Neural foraminal stenosis of lumbar spine    Chronic low back pain    Spinal stenosis, lumbar    Acquired absence of other specified parts of digestive tract    Arthrodesis status    Asymptomatic menopausal state    Extravasation of urine    Gastro-esophageal reflux disease without esophagitis    Hyperlipidemia, unspecified    Closed compression fracture of body of L1 vertebra (HCC)    Lumbar burst fracture, open, initial encounter ([place])    Pharyngoesophageal dysphagia    Anxiety    Benign essential tremor    Dizziness    Memory problem    Balance problems    Chronic cerebral ischemia    Neck stiffness    Tingling  sensation    Left arm weakness    Dysphagia    Brain dysfunction    Carotid stenosis, asymptomatic, bilateral    Knee pain    Fatigue    Osteoarthrosis    Alzheimer's disease, unspecified    Chronic obstructive pulmonary disease, unspecified    Hypertensive urgency    Bradycardia    Frequent falls    Thrush, oral    Glaucoma of both eyes    Diverticulosis    Coronary artery disease involving native heart without angina pectoris    Essential hypertension        Current medications are:  Current Outpatient Medications   Medication Sig Dispense Refill    Roflumilast (DALIRESP) 500 MCG tablet Take 1 tablet by mouth Every Day      atorvastatin (LIPITOR) 20 MG tablet Take 1 tablet by mouth daily 90 tablet 2    ANORO ELLIPTA 62.5-25 MCG/ACT inhaler Inhale 1  puff into the lungs daily      LORazepam (ATIVAN) 0.5 MG tablet Take 1 tablet by mouth nightly for 90 days. Max Daily Amount: 0.5 mg 90 tablet 0    aspirin 81 MG EC tablet Take 1 tablet by mouth daily 30 tablet 0    EQ 8HR ARTHRITIS PAIN RELIEF 650 MG extended release tablet TAKE 2 TABLETS BY MOUTH EVERY 8 HOURS AS NEEDED FOR PAIN FOR FEVER      latanoprost (XALATAN) 0.005 % ophthalmic solution       omeprazole (PRILOSEC) 40 MG delayed release capsule       nitroGLYCERIN (NITROSTAT) 0.4 MG SL tablet Place 1 tablet under the tongue every 5 minutes as needed for Chest pain 25 tablet 3    citalopram (CELEXA) 20 MG tablet Take 1 tablet by mouth daily 90 tablet 3    donepezil (ARICEPT) 5 MG tablet Take 1 tablet by mouth nightly 90 tablet 3    polyethylene glycol (GLYCOLAX) 17 GM/SCOOP powder Take 17 g by mouth daily As needed.      alendronate (FOSAMAX) 70 MG tablet Take 1 tablet by mouth every 7 days 12 tablet 3    CRANBERRY PO Take by mouth      Cholecalciferol (VITAMIN D3) 50 MCG (2000 UT) CAPS Take by mouth      CALCIUM CARBONATE-VITAMIN D PO Take 600 mg by mouth 2 times daily Contains 600 mg Calcium & 800 IU Vitamin D3.      Multiple Vitamins-Minerals (THERAPEUTIC  MULTIVITAMIN-MINERALS) tablet Take 1 tablet by mouth daily      polyvinyl alcohol-povidone (HYPOTEARS) 1.4-0.6 % ophthalmic solution Place 1-2 drops into both eyes as needed      Handicap Placard MISC by Does not apply route Issue parking placard for person with disability; Applicant meets the qualifying disability criteria. Length of time expected to have disability.  Prescription expires in 5 years from issuing date. 1 each 0     No current facility-administered medications for this visit.        She is allergic to morphine, pcn [penicillins], zithromax [azithromycin], and adhesive tape.    She  reports that she quit smoking about 33 years ago. Her smoking use included cigarettes. She started smoking about 69 years ago. She has a 40.0 pack-year smoking history. She has never used smokeless tobacco.      Objective:    Vitals:    12/28/21 1302   BP: (!) 150/78   Site: Left Upper Arm   Position: Sitting   Cuff Size: Medium Adult   Pulse: 71   Resp: 16   Temp: 98.8 F (37.1 C)   TempSrc: Tympanic   SpO2: 92%   Weight: 59.4 kg (131 lb)   Height: 1.626 m (_0 )     Body mass index is 22.49 kg/m.    Well-nourished, well-developed elderly female, alert, cooperative, and in no acute distress.  Neck supple. No adenopathy. Thyroid symmetric, normal size.  Chest is clear to auscultation, no wheezes, rales, or rhonchi.  Heart sounds are regular rate and rhythm, no murmurs.  Abdomen is soft, non-tender, non-distended.  There are no masses palpated.   Lower extremities have no edema.    I reviewed the results of testing done today with the patient:   Hospital Outpatient Visit on 12/28/2021   Component Date Value Ref Range Status    WBC 12/28/2021 11.9 (H)  3.5 - 11.3 k/uL Final    RBC 12/28/2021 4.29  3.95 - 5.11  m/uL Final    Hemoglobin 12/28/2021 13.4  11.9 - 15.1 g/dL Final    Hematocrit 12/28/2021 41.3  36.3 - 47.1 % Final    MCV 12/28/2021 96.3  82.6 - 102.9 fL Final    MCH 12/28/2021 31.2  25.2 - 33.5 pg Final    MCHC  12/28/2021 32.4  25.2 - 33.5 g/dL Final    RDW 12/28/2021 13.2  11.8 - 14.4 % Final    Platelets 12/28/2021 244  138 - 453 k/uL Final    MPV 12/28/2021 10.2  8.1 - 13.5 fL Final    NRBC Automated 12/28/2021 0.0  0.0 per 100 WBC Final    Neutrophils % 12/28/2021 69 (H)  36 - 65 % Final    Lymphocytes % 12/28/2021 14 (L)  24 - 43 % Final    Monocytes % 12/28/2021 11  3 - 12 % Final    Eosinophils % 12/28/2021 3  1 - 4 % Final    Basophils % 12/28/2021 1  0 - 2 % Final    Immature Granulocytes 12/28/2021 2 (H)  0 % Final    Neutrophils Absolute 12/28/2021 8.24 (H)  1.50 - 8.10 k/uL Final    Lymphocytes Absolute 12/28/2021 1.61  1.10 - 3.70 k/uL Final    Monocytes Absolute 12/28/2021 1.31 (H)  0.10 - 1.20 k/uL Final    Eosinophils Absolute 12/28/2021 0.35  0.00 - 0.44 k/uL Final    Basophils Absolute 12/28/2021 0.14  0.00 - 0.20 k/uL Final    Absolute Immature Granulocyte 12/28/2021 0.20  0.00 - 0.30 k/uL Final    Sodium 12/28/2021 145 (H)  135 - 144 mmol/L Final    Potassium 12/28/2021 4.1  3.7 - 5.3 mmol/L Final    Chloride 12/28/2021 106  98 - 107 mmol/L Final    CO2 12/28/2021 27  20 - 31 mmol/L Final    Anion Gap 12/28/2021 12  9 - 17 mmol/L Final    Glucose 12/28/2021 83  70 - 99 mg/dL Final    BUN 12/28/2021 21  8 - 23 mg/dL Final    Creatinine 12/28/2021 0.7  0.5 - 0.9 mg/dL Final    Est, Glom Filt Rate 12/28/2021 >60  >60 mL/min/1.19m Final    Comment:       These results are not intended for use in patients <187years of age.        eGFR results are calculated without a race factor using the 2021 CKD-EPI equation.  Careful clinical correlation is recommended, particularly when comparing to results   calculated using previous equations.  The CKD-EPI equation is less accurate in patients with extremes of muscle mass, extra-renal   metabolism of creatine, excessive creatine ingestion, or following therapy that affects   renal tubular secretion.      Bun/Cre Ratio 12/28/2021 30 (H)  9 - 20 Final    Calcium  12/28/2021 10.0  8.6 - 10.4 mg/dL Final    Total Protein 12/28/2021 7.3  6.4 - 8.3 g/dL Final    Albumin 12/28/2021 4.3  3.5 - 5.2 g/dL Final    Albumin/Globulin Ratio 12/28/2021 1.4  1.0 - 2.5 Final    Total Bilirubin 12/28/2021 0.3  0.3 - 1.2 mg/dL Final    Alkaline Phosphatase 12/28/2021 67  35 - 104 U/L Final    ALT 12/28/2021 10  5 - 33 U/L Final    AST 12/28/2021 16  <32 U/L Final    TSH 12/28/2021 0.70  0.30 - 5.00 uIU/mL  Final    Color, UA 12/28/2021 Yellow  Yellow Final    Turbidity UA 12/28/2021 Clear  Clear Final    Glucose, Ur 12/28/2021 NEGATIVE  NEGATIVE mg/dL Final    Bilirubin Urine 12/28/2021 NEGATIVE  NEGATIVE Final    Ketones, Urine 12/28/2021 NEGATIVE  NEGATIVE mg/dL Final    Specific Gravity, UA 12/28/2021 1.020  1.010 - 1.025 Final    Urine Hgb 12/28/2021 NEGATIVE  NEGATIVE Final    pH, UA 12/28/2021 6.0  5.0 - 6.0 Final    Protein, UA 12/28/2021 NEGATIVE  NEGATIVE mg/dL Final    Urobilinogen, Urine 12/28/2021 Normal  0.0 - 1.0 EU/dL Final    Nitrite, Urine 12/28/2021 NEGATIVE  NEGATIVE Final    Leukocyte Esterase, Urine 12/28/2021 NEGATIVE  NEGATIVE Final    Comment 12/28/2021 Microscopic exam not performed based on chemical results unless requested in original order.   Final    Comment 12/28/2021        Final    Comment 12/28/2021    Final                    Value:Utilizing a urinalysis as the only screening method to exclude a potential uropathogen can be unreliable in many patient populations.  Rapid screening tests are less sensitive than culture and if UTI is a clinical possibility, culture should be considered despite a negative urinalysis.     Office Visit on 12/28/2021   Component Date Value Ref Range Status    VALID INTERNAL CONTROL 12/28/2021 Pass   Final    Lot/Kit Number 12/28/2021 [id number]   Final    Lot/Kit expire date: 12/28/2021 11/20/22   Final    SARS-COV-2, POC 12/28/2021 Not-Detected  Not Detected Final    Influenza A Antigen, POC 12/28/2021 Negative   Final    Influenza B  Antigen, POC 12/28/2021 Negative   Final    Vendor and kit name 12/28/2021 [employer]   Final          (Please note that portions of this note were completed with a voice-recognition program. Efforts were made to edit the dictation but occasionally words are mis-transcribed.)

## 2022-01-05 ENCOUNTER — Inpatient Hospital Stay: Admit: 2022-01-05 | Payer: MEDICARE | Primary: Family Medicine

## 2022-01-05 DIAGNOSIS — R1084 Generalized abdominal pain: Secondary | ICD-10-CM

## 2022-01-05 MED ORDER — IOPAMIDOL 76 % IV SOLN
76 % | Freq: Once | INTRAVENOUS | Status: AC | PRN
Start: 2022-01-05 — End: 2022-01-05
  Administered 2022-01-05: 16:00:00 100 mL via INTRAVENOUS

## 2022-01-17 ENCOUNTER — Encounter

## 2022-01-18 NOTE — Telephone Encounter (Signed)
[place], last fill 10/21/21, quantity 90 for 90 days.     Katie Juarez called requesting a refill of the below medication which has been pended for you:     Requested Prescriptions     Pending Prescriptions Disp Refills    LORazepam (ATIVAN) 0.5 MG tablet 90 tablet 0     Sig: Take 1 tablet by mouth nightly for 90 days. Max Daily Amount: 0.5 mg       Last Appointment Date: 12/14/2021  Next Appointment Date: 05/18/2022    Allergies   Allergen Reactions    Morphine Other (See Comments)     Night sweats,felt "loopy".    Pcn [Penicillins] Hives    Zithromax [Azithromycin] Hives    Adhesive Tape Rash

## 2022-01-18 NOTE — Telephone Encounter (Signed)
Controlled Substance Monitoring:    Acute and Chronic Pain Monitoring:   RX Monitoring Periodic Controlled Substance Monitoring   01/18/2022   7:32 PM No signs of potential drug abuse or diversion identified.

## 2022-01-19 MED ORDER — LORAZEPAM 0.5 MG PO TABS
0.5 MG | ORAL_TABLET | Freq: Every evening | ORAL | 0 refills | Status: DC
Start: 2022-01-19 — End: 2022-04-18

## 2022-02-13 ENCOUNTER — Encounter

## 2022-02-14 NOTE — Telephone Encounter (Signed)
Katie Juarez called requesting a refill of the below medication which has been pended for you:     Requested Prescriptions     Pending Prescriptions Disp Refills    donepezil (ARICEPT) 5 MG tablet [Pharmacy Med Name: Donepezil HCl 5 MG Oral Tablet] 90 tablet 0     Sig: Take 1 tablet by mouth nightly       Last Appointment Date: 12/14/2021  Next Appointment Date: 05/18/2022    Allergies   Allergen Reactions    Morphine Other (See Comments)     Night sweats,felt "loopy".    Pcn [Penicillins] Hives    Zithromax [Azithromycin] Hives    Adhesive Tape Rash

## 2022-02-16 MED ORDER — DONEPEZIL HCL 5 MG PO TABS
5 | ORAL_TABLET | Freq: Every evening | ORAL | 3 refills | Status: DC
Start: 2022-02-16 — End: 2022-07-24

## 2022-02-27 IMAGING — MR MRI THORACIC SPINE WITHOUT CONTRAST
4 of 8 series · 15 of 48 positions shown · IV contrast (gadolinium)
Comparison: None.

________________________________________________________________________________________________ 
MRI THORACIC SPINE WITHOUT CONTRAST, MRI LUMBAR SPINE WITHOUT CONTRAST, 
02/27/2022 [DATE]: 
CLINICAL INDICATION: Chronic back pain with bilateral leg pain, right greater 
the left. History of breast cancer.
TECHNIQUE: Multiplanar, multiecho position MR images of the thoracic and lumbar 
spine were performed without intravenous gadolinium enhancement. Patient was 
scanned on a 1.5 Tesla magnet.

[Series 102: T2 · sagittal · 4.0mm · 0.62mm/px · 3 of 11 slices shown (1 of 2)]
[im 1/11]
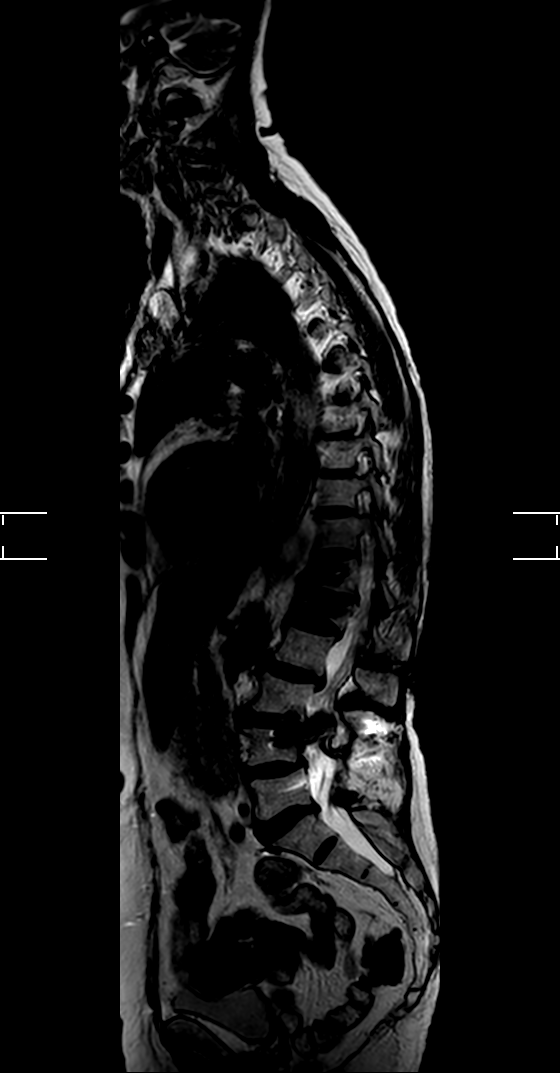
[im 6/11]
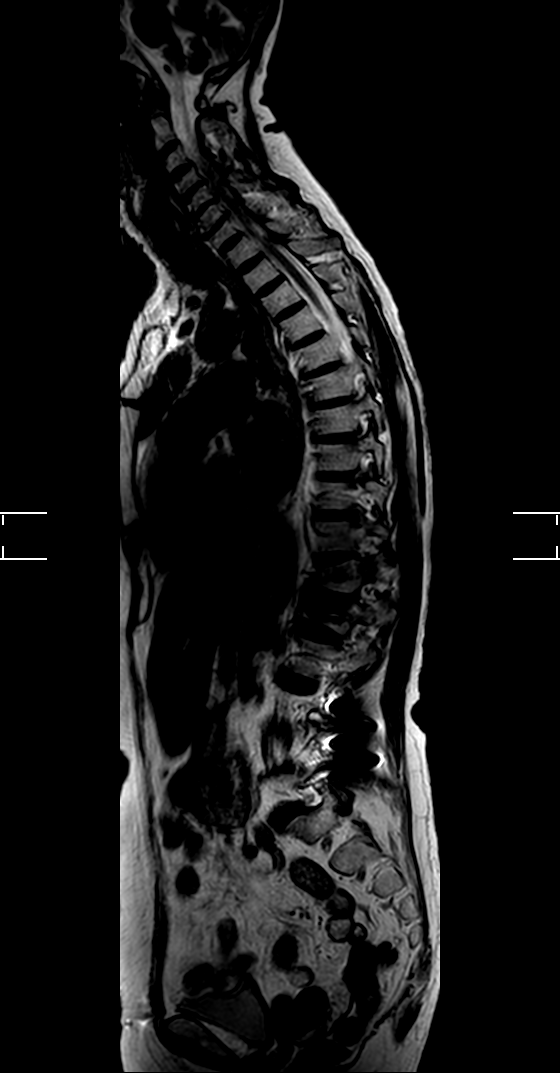
[im 11/11]
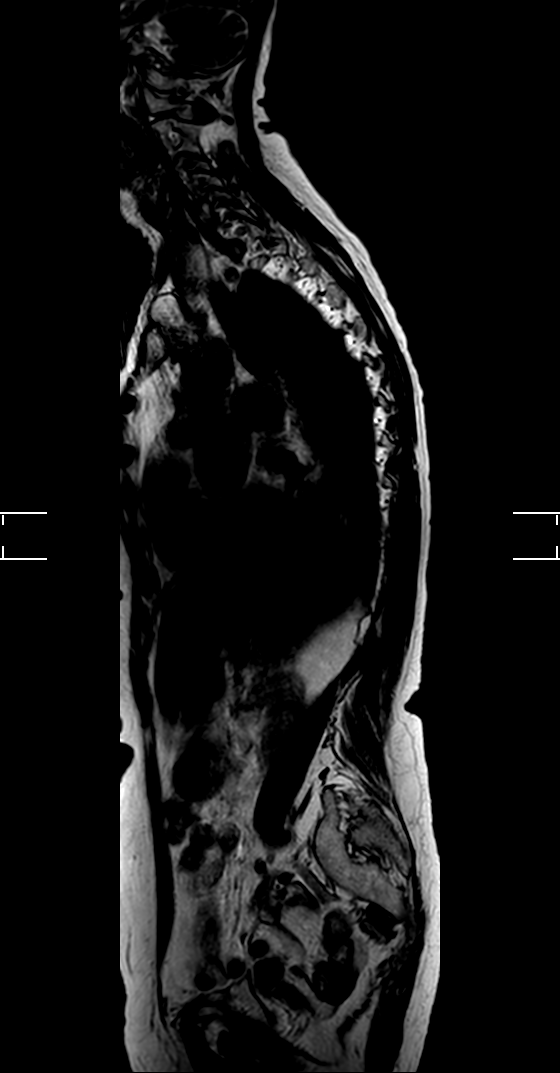

[Series 301: t2_cor_count · coronal · 4.0mm · 0.61mm/px · 2 of 15 slices shown]
[im 1/15]
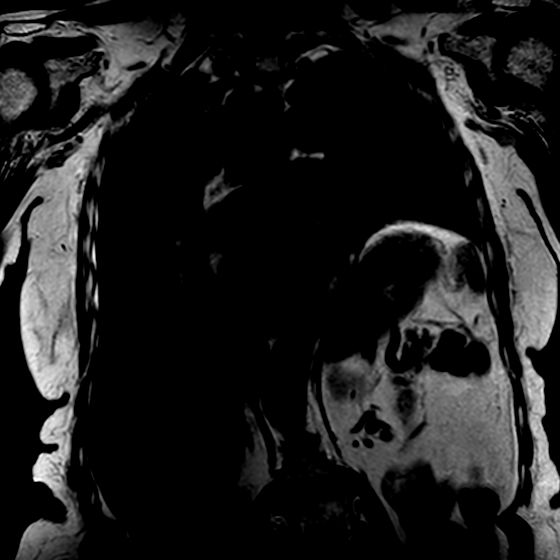
[im 15/15]
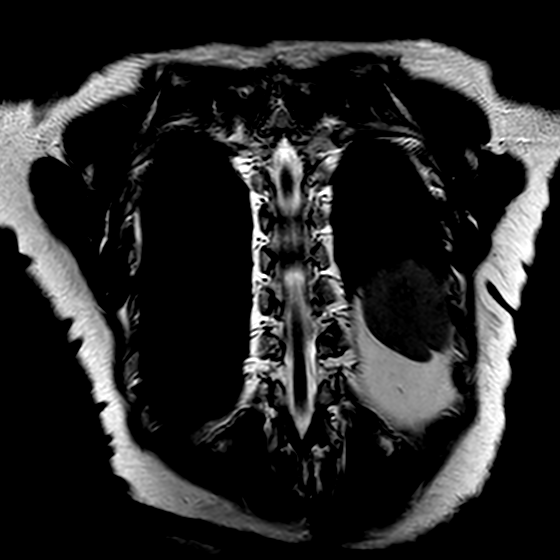

[Series 401: T1 · sagittal · 3.0mm · 0.47mm/px · 3 of 21 slices shown]
[im 1/21]
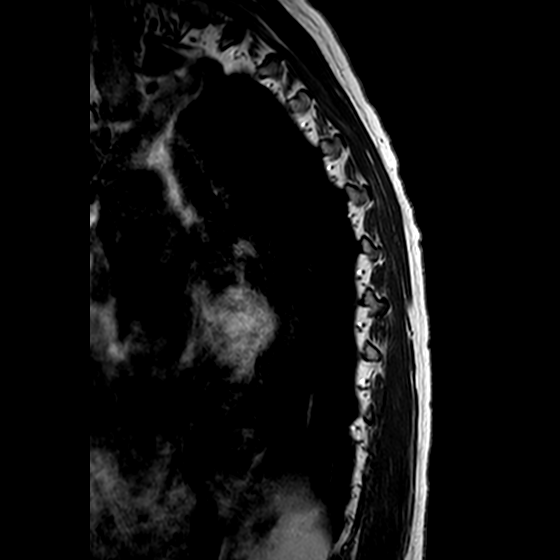
[im 11/21]
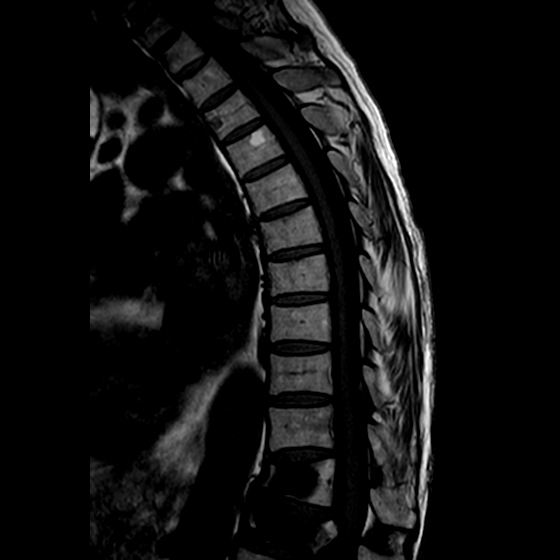
[im 21/21]
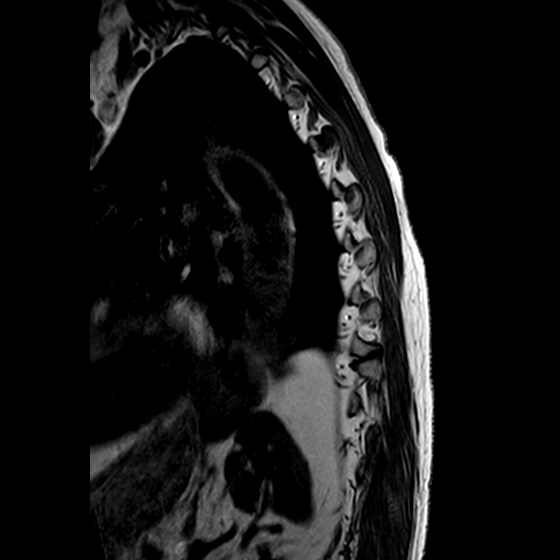

[Series 701: T2 · axial · 4.0mm · 0.42mm/px · z∈[-217,-19]mm · 7 of 66 slices shown (2 of 2)]
[im 1/66]
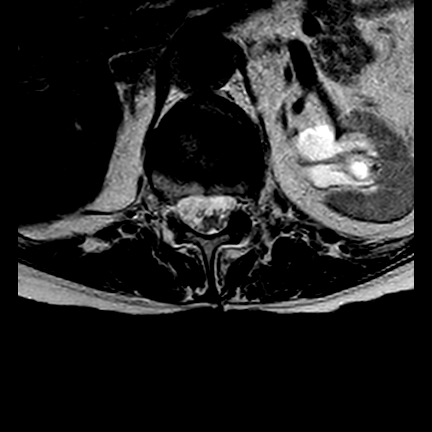
[im 8/66]
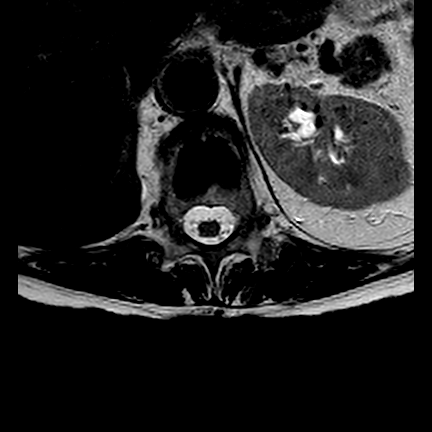
[im 22/66]
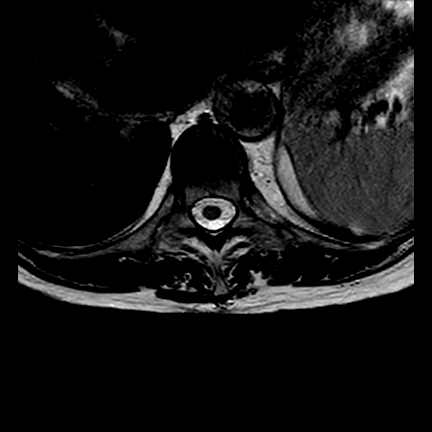
[im 29/66]
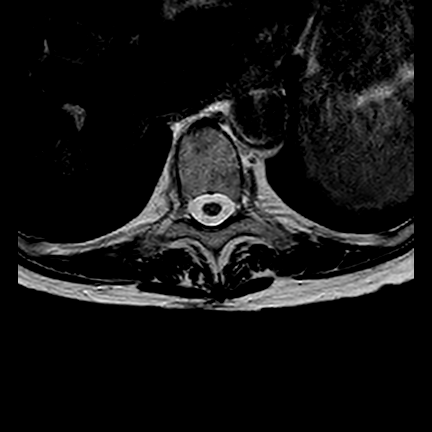
[im 37/66]
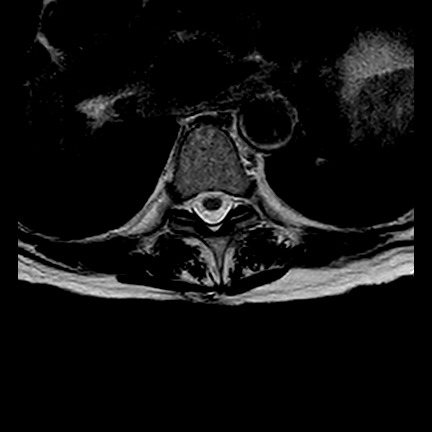
[im 44/66]
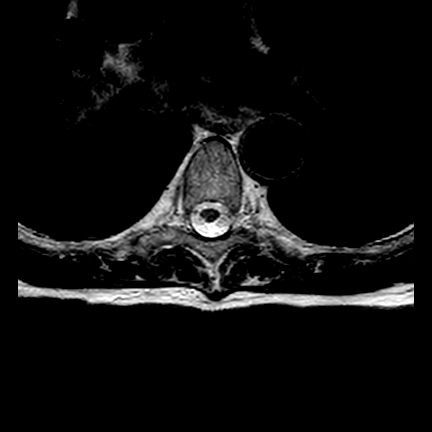
[im 58/66]
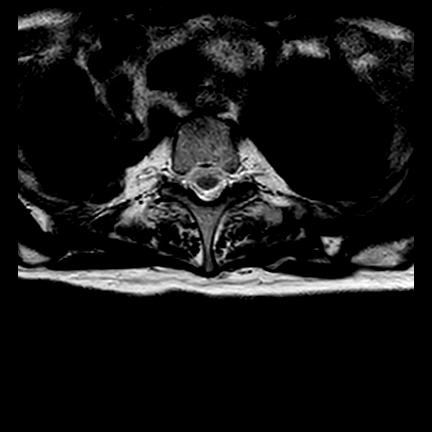

[15 of 48 positions shown; findings below may reference images not displayed]

FINDINGS: -------------------------------------------------------------------------------- 
------ 
COMPRESSION FRACTURES: 
Chronic compression fracture visualized of the C7 superior endplate with mild 
loss of height. Minimal chronic compression fractures of T1, T2, T3 superior 
endplates with minimal loss of superior endplate height. 
Mild compression fracture of T11 with mild loss of height anteriorly, no 
significant loss of height posteriorly. This is chronic status post vertebral 
body augmentation. 
Compression fracture of T12, chronic, status post vertebral body augmentation. 
Moderate loss of height anteriorly and posteriorly with retropulsion present, 
measuring up to 7 mm. 
-------------------------------------------------------------------------------- 
------ 
GENERAL: 
Please note that there are 4 lumbar type vertebral bodies with L5 considered 
completely sacralized. 
ALIGNMENT: Leftward curvature of the lumbar spine. Mild retrolisthesis of T12 on 
L1, L1 on L2, L2 on L3. Minimal anterolisthesis of L3 on L4 and minimal 
retrolisthesis of L4 on L5. 
MARROW SIGNAL: Schmorls node with surrounding edema within the T10 superior 
endplate towards left side. 
CORD SIGNAL: Normal cord signal, conus terminates at mid L1 level. 
ADDITIONAL FINDINGS: Left renal cysts. Elevation of the left hemidiaphragm. 
-------------------------------------------------------------------------------- 
------ 
RELEVANT SEGMENTAL (levels with moderate/severe stenosis or significant 
findings): 
T11-T12: Retropulsion of the T12 superior endplate resulting in moderate central 
canal narrowing as well as deformity of the ventral cord. No significant neural 
foraminal narrowing. 
L1-L2: Disc bulge. Bilateral ligamentum flavum hypertrophy. Mild central canal 
narrowing. Moderate bilateral subarticular recess narrowing. No significant 
right neural foraminal narrowing. Mild left neural foraminal narrowing. 
L2-L3: Loss of disc height posteriorly with disc bulge. Right subarticular disc 
herniation extending inferiorly. Bilateral ligamentum flavum hypertrophy. Mild 
central canal narrowing. Severe right subarticular recess narrowing with mass 
effect upon the traversing right L3 nerve root caused by disc herniation. 
Moderate left subarticular recess narrowing. Moderate right neural foraminal 
narrowing. Mild left neural foraminal narrowing. 
L3-L4: Laminectomy. Posterior hardware fusion with pedicle screws and posterior 
fusion rods. Disc bulge. No significant central canal narrowing. No significant 
left neural foraminal narrowing. Mild right neural foraminal narrowing caused by 
inferior extension of the disc herniation from the L2-L3 level. 
L4-L5: Disc bulge. Bilateral facet and ligamentum flavum hypertrophy. Mild 
central canal narrowing with moderate bilateral subarticular recess narrowing. 
Moderate to severe left neural foraminal narrowing. No significant right neural 
foraminal narrowing. 
-------------------------------------------------------------------------------- 
------
IMPRESSION: 1.  Chronic compression fractures, status post vertebral body augmentation at 
T11 and T12. Retropulsion at T12 resulting in moderate central canal narrowing 
and cord deformity. 
2.  Postsurgical changes in the lumbar spine. Large disc herniation at L2-L3 
extending inferiorly causing severe right subarticular recess narrowing and mass 
effect upon the traversing right L3 nerve root, please correlate for right L3 
radiculopathy. 
3.  Moderate to severe left neural foraminal narrowing L4-L5.  
4.  L5 is considered completely sacralized with 4 lumbar type vertebral bodies. 
5.  Elevation of left hemidiaphragm of unclear etiology, recommend dedicated 
chest CT with contrast for further assessment.

## 2022-02-27 IMAGING — MR MRI LUMBAR SPINE WITHOUT CONTRAST
6 of 8 series · 14 of 48 positions shown · IV contrast (gadolinium)
Comparison: None.

________________________________________________________________________________________________ 
MRI THORACIC SPINE WITHOUT CONTRAST, MRI LUMBAR SPINE WITHOUT CONTRAST, 
02/27/2022 [DATE]: 
CLINICAL INDICATION: Chronic back pain with bilateral leg pain, right greater 
the left. History of breast cancer.
TECHNIQUE: Multiplanar, multiecho position MR images of the thoracic and lumbar 
spine were performed without intravenous gadolinium enhancement. Patient was 
scanned on a 1.5 Tesla magnet.

[Series 101: t2_sag_count · sagittal · 4.0mm · 0.62mm/px · 2 of 22 slices shown]
[im 1/22]
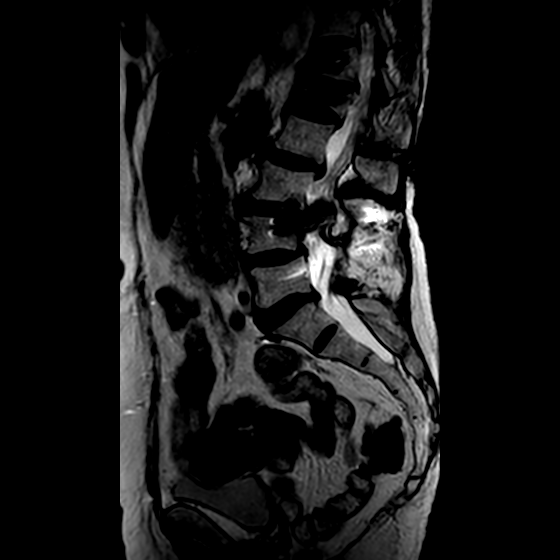
[im 22/22]
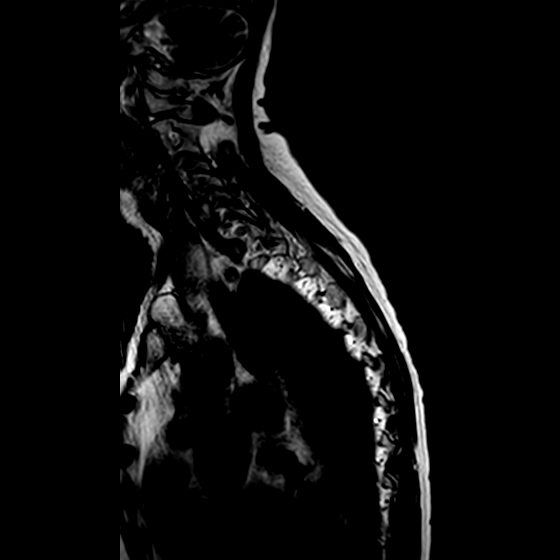

[Series 201: t2w_cor-surv · coronal · 6.0mm · 0.62mm/px · 1 of 10 slices shown]
[im 1/10]
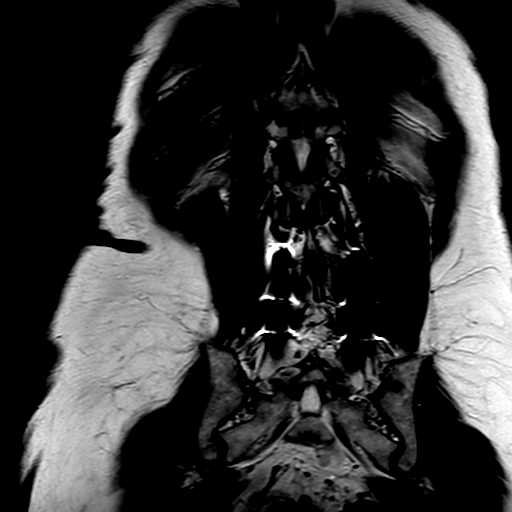

[Series 801: T1 · sagittal · 4.0mm · 0.46mm/px · 3 of 18 slices shown]
[im 1/18]
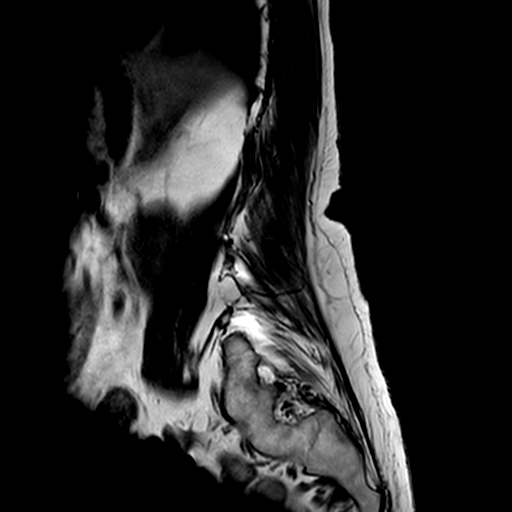
[im 9/18]
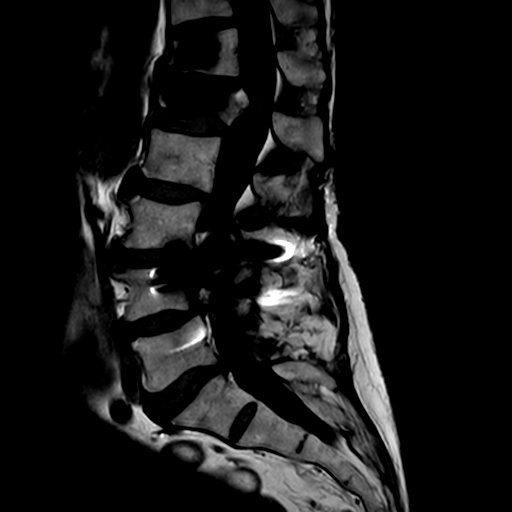
[im 18/18]
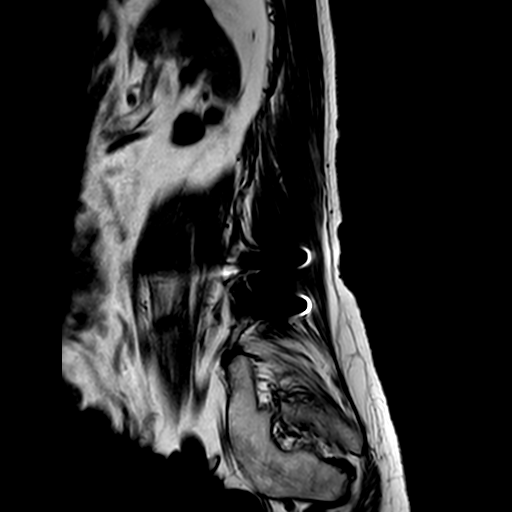

[Series 902: (id)_mdixon_tse · sagittal · 4.0mm · 0.37mm/px · 3 of 18 slices shown]
[im 1/18]
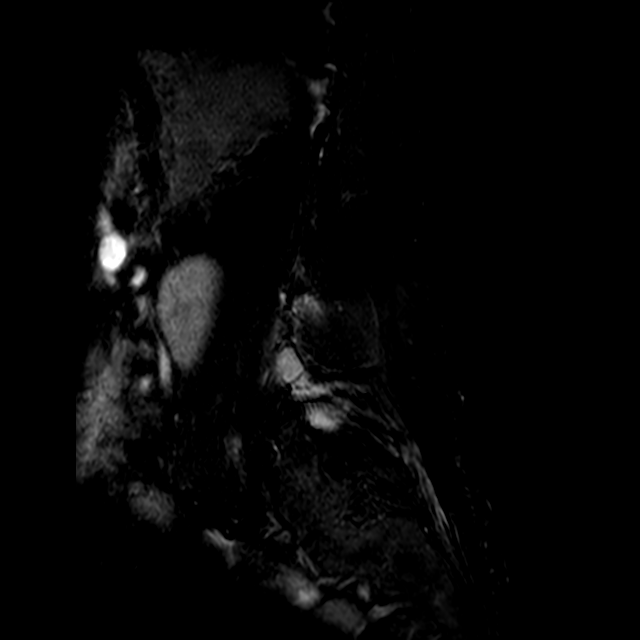
[im 9/18]
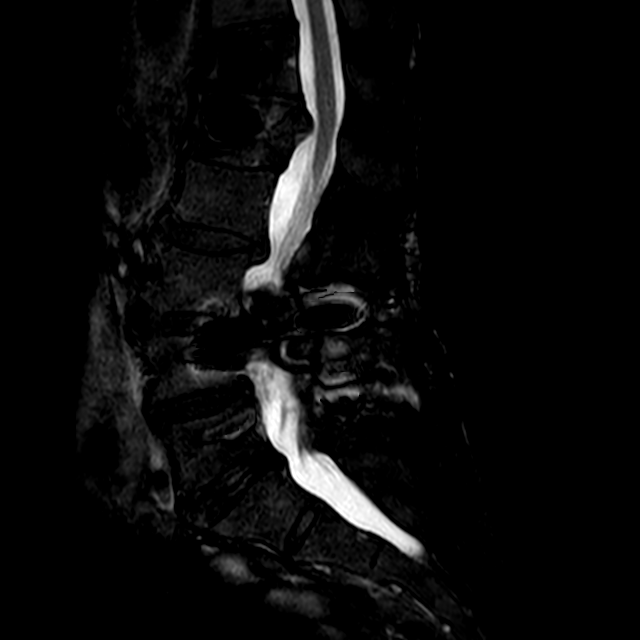
[im 18/18]
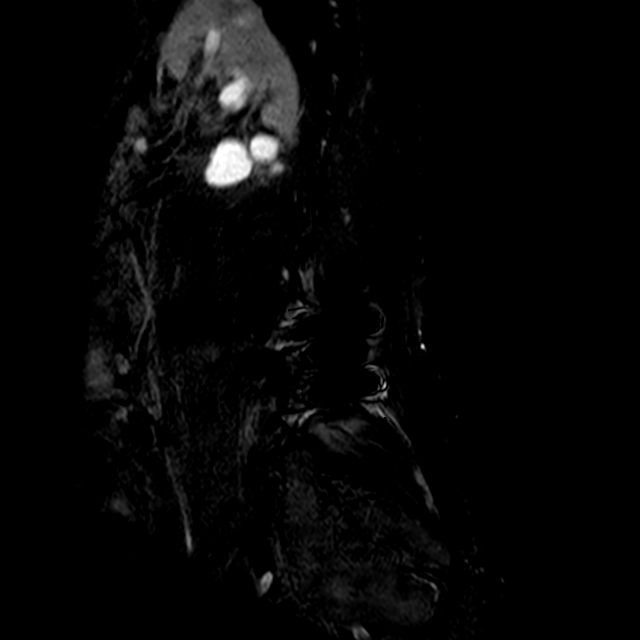

[Series 903: st2w_mdixon_tse · sagittal · 4.0mm · 0.37mm/px · 1 of 18 slices shown]
[im 1/18]
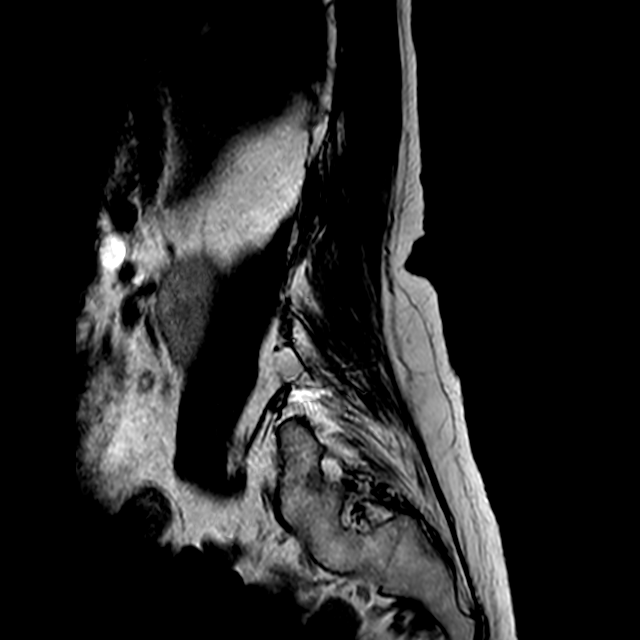

[Series 1101: T2 · axial · 4.0mm · 0.30mm/px · z∈[-360,-177]mm · 4 of 30 slices shown]
[im 1/30]
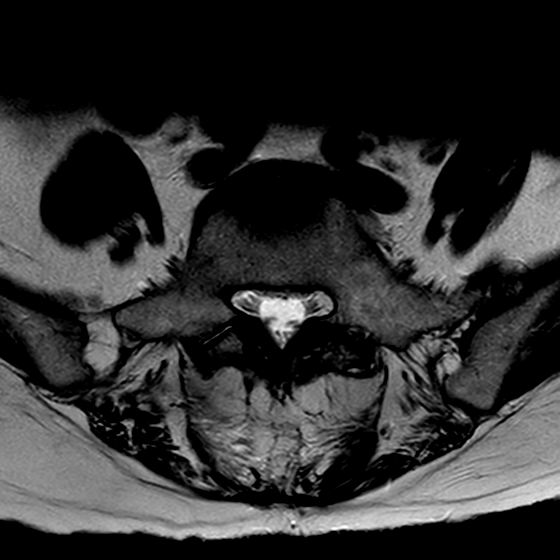
[im 10/30]
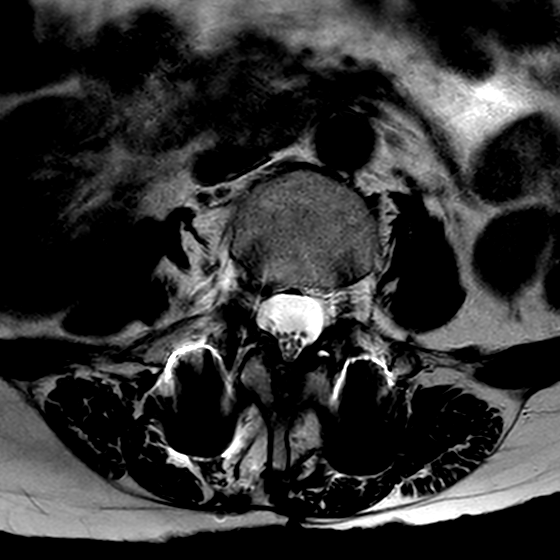
[im 20/30]
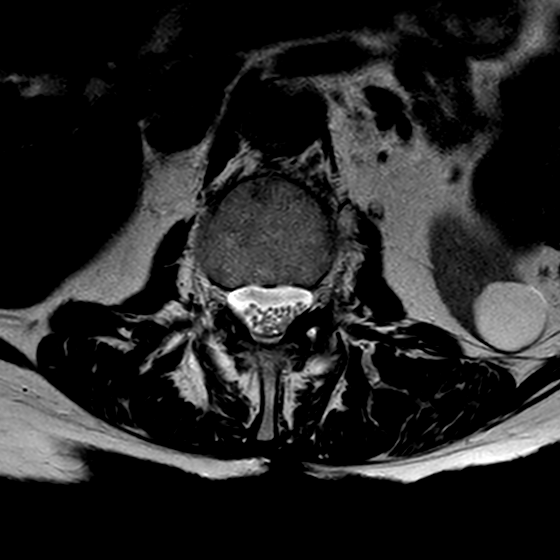
[im 30/30]
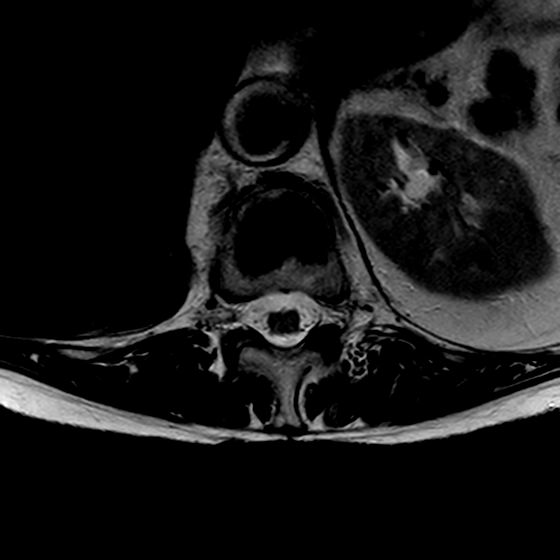

[14 of 48 positions shown; findings below may reference images not displayed]

FINDINGS: -------------------------------------------------------------------------------- 
------ 
COMPRESSION FRACTURES: 
Chronic compression fracture visualized of the C7 superior endplate with mild 
loss of height. Minimal chronic compression fractures of T1, T2, T3 superior 
endplates with minimal loss of superior endplate height. 
Mild compression fracture of T11 with mild loss of height anteriorly, no 
significant loss of height posteriorly. This is chronic status post vertebral 
body augmentation. 
Compression fracture of T12, chronic, status post vertebral body augmentation. 
Moderate loss of height anteriorly and posteriorly with retropulsion present, 
measuring up to 7 mm. 
-------------------------------------------------------------------------------- 
------ 
GENERAL: 
Please note that there are 4 lumbar type vertebral bodies with L5 considered 
completely sacralized. 
ALIGNMENT: Leftward curvature of the lumbar spine. Mild retrolisthesis of T12 on 
L1, L1 on L2, L2 on L3. Minimal anterolisthesis of L3 on L4 and minimal 
retrolisthesis of L4 on L5. 
MARROW SIGNAL: Schmorls node with surrounding edema within the T10 superior 
endplate towards left side. 
CORD SIGNAL: Normal cord signal, conus terminates at mid L1 level. 
ADDITIONAL FINDINGS: Left renal cysts. Elevation of the left hemidiaphragm. 
-------------------------------------------------------------------------------- 
------ 
RELEVANT SEGMENTAL (levels with moderate/severe stenosis or significant 
findings): 
T11-T12: Retropulsion of the T12 superior endplate resulting in moderate central 
canal narrowing as well as deformity of the ventral cord. No significant neural 
foraminal narrowing. 
L1-L2: Disc bulge. Bilateral ligamentum flavum hypertrophy. Mild central canal 
narrowing. Moderate bilateral subarticular recess narrowing. No significant 
right neural foraminal narrowing. Mild left neural foraminal narrowing. 
L2-L3: Loss of disc height posteriorly with disc bulge. Right subarticular disc 
herniation extending inferiorly. Bilateral ligamentum flavum hypertrophy. Mild 
central canal narrowing. Severe right subarticular recess narrowing with mass 
effect upon the traversing right L3 nerve root caused by disc herniation. 
Moderate left subarticular recess narrowing. Moderate right neural foraminal 
narrowing. Mild left neural foraminal narrowing. 
L3-L4: Laminectomy. Posterior hardware fusion with pedicle screws and posterior 
fusion rods. Disc bulge. No significant central canal narrowing. No significant 
left neural foraminal narrowing. Mild right neural foraminal narrowing caused by 
inferior extension of the disc herniation from the L2-L3 level. 
L4-L5: Disc bulge. Bilateral facet and ligamentum flavum hypertrophy. Mild 
central canal narrowing with moderate bilateral subarticular recess narrowing. 
Moderate to severe left neural foraminal narrowing. No significant right neural 
foraminal narrowing. 
-------------------------------------------------------------------------------- 
------
IMPRESSION: 1.  Chronic compression fractures, status post vertebral body augmentation at 
T11 and T12. Retropulsion at T12 resulting in moderate central canal narrowing 
and cord deformity. 
2.  Postsurgical changes in the lumbar spine. Large disc herniation at L2-L3 
extending inferiorly causing severe right subarticular recess narrowing and mass 
effect upon the traversing right L3 nerve root, please correlate for right L3 
radiculopathy. 
3.  Moderate to severe left neural foraminal narrowing L4-L5.  
4.  L5 is considered completely sacralized with 4 lumbar type vertebral bodies. 
5.  Elevation of left hemidiaphragm of unclear etiology, recommend dedicated 
chest CT with contrast for further assessment.

## 2022-03-30 ENCOUNTER — Encounter

## 2022-03-30 NOTE — Telephone Encounter (Signed)
Katie Juarez called requesting a refill of the below medication which has been pended for you:     Requested Prescriptions     Pending Prescriptions Disp Refills    citalopram (CELEXA) 20 MG tablet [Pharmacy Med Name: Citalopram Hydrobromide 20 MG Oral Tablet] 90 tablet 0     Sig: Take 1 tablet by mouth once daily       Last Appointment Date: 12/14/2021  Next Appointment Date: 05/18/2022    Allergies   Allergen Reactions    Morphine Other (See Comments)     Night sweats,felt "loopy".    Pcn [Penicillins] Hives    Zithromax [Azithromycin] Hives    Adhesive Tape Rash

## 2022-04-02 MED ORDER — CITALOPRAM HYDROBROMIDE 20 MG PO TABS
20 | ORAL_TABLET | Freq: Every day | ORAL | 3 refills | Status: DC
Start: 2022-04-02 — End: 2022-07-24

## 2022-04-18 ENCOUNTER — Encounter

## 2022-04-18 NOTE — Telephone Encounter (Addendum)
Per [facility], last fill 10/21/21, quantity 90 for 90 days. Per dispense report, last fill 01/19/22 90 for 90 days.    Call to pharmacy, donezepil sent 02/16/22. Pt did not pick up, will prepare for pt now.      Dasha called requesting a refill of the below medication which has been pended for you:     Requested Prescriptions     Pending Prescriptions Disp Refills    LORazepam (ATIVAN) 0.5 MG tablet 90 tablet 0     Sig: Take 1 tablet by mouth nightly for 90 days. Max Daily Amount: 0.5 mg       Last Appointment Date: 12/14/2021  Next Appointment Date: 05/18/2022    Allergies   Allergen Reactions    Morphine Other (See Comments)     Night sweats,felt "loopy".    Pcn [Penicillins] Hives    Zithromax [Azithromycin] Hives    Adhesive Tape Rash

## 2022-04-20 MED ORDER — LORAZEPAM 0.5 MG PO TABS
0.5 MG | ORAL_TABLET | Freq: Every evening | ORAL | 0 refills | Status: AC
Start: 2022-04-20 — End: 2022-07-19

## 2022-04-20 NOTE — Telephone Encounter (Signed)
Controlled Substance Monitoring:    Acute and Chronic Pain Monitoring:   RX Monitoring Periodic Controlled Substance Monitoring   04/20/2022   6:52 PM No signs of potential drug abuse or diversion identified.

## 2022-04-20 NOTE — Telephone Encounter (Signed)
error 

## 2022-05-18 ENCOUNTER — Ambulatory Visit: Admit: 2022-05-18 | Discharge: 2022-05-18 | Payer: MEDICARE | Attending: Family Medicine | Primary: Family Medicine

## 2022-05-18 ENCOUNTER — Ambulatory Visit
Admit: 2022-05-18 | Discharge: 2022-05-18 | Payer: MEDICARE | Attending: Cardiovascular Disease | Primary: Family Medicine

## 2022-05-18 DIAGNOSIS — M8580 Other specified disorders of bone density and structure, unspecified site: Secondary | ICD-10-CM

## 2022-05-18 DIAGNOSIS — I10 Essential (primary) hypertension: Secondary | ICD-10-CM

## 2022-05-18 NOTE — Progress Notes (Unsigned)
[facility]  [address]  [place], [place] [address]  [phone]      Katie Juarez is a 82 y.o. female who presents today for her medical conditions/complaints as noted below.  Katie Juarez is c/o of Hypertension      HPI:     Pt here today for follow-up of HTN, HYPERLIPIDEMIA, and chronic pain.    Saw Dr. Artis Flock (Pain Mgmt) - had ablation of lumbar spine/SI joint yesterday  Feels that this has helped already  Will see him again next month for follow-up    Taking Tylenol most days as needed for pain - last approx 6 hours; occasionally taking Ibuprofen    Still seeing Dr. Shawnie Pons (Pulm) - still using Anoro 1 puff daily and Arnuity inhaler 1 puff daily; feels her breathing is doing well but still "spitting up" quite a bit of phlegm.  Will see him again at the end of June for follow-up.    Seeing Dr. Gilman Buttner (GI at [facility]) - taking Omeprazole 40 mg BID but doesn't always remember to take before meals so not always taking.  Most recently she has a lot of phlegm production and can bring up large amount of water after drinking.  Still going through 1/2 box of Kleenex at night from productive cough.    Memory has been stable in the past 5 months per daughter; attributes any worsening due to pain. Hoping this will improve with pain improvement.    Still taking stool softeners or Miralax every day (alternates) for constipation - moving her bowels daily.        Past Medical History:   Diagnosis Date    Basal cell carcinoma (BCC) of face 10/2018    excised by Dr Filbert Berthold     Breast cancer [facility]) 1999    in remission    Constipation 12/27/2018    COPD (chronic obstructive pulmonary disease) (HCC)     COVID-19 11/06/2020    DDD (degenerative disc disease)     Diverticulitis 06/28/2018    Ileus (HCC) 08/07/2021    Large bowel obstruction (HCC) 02/2015    [facility]    LBP (low back pain)     Pharyngoesophageal dysphagia     SS (spinal stenosis)       Past Surgical History:    Procedure Laterality Date    APPENDECTOMY  1956    BLADDER SUSPENSION  1990    BREAST LUMPECTOMY Right 1999    CARDIAC CATHETERIZATION  12/21/2020    Dr Simone Curia    CHOLECYSTECTOMY  1990    EXCISION/BIOPSY Left 11/01/2018    Dr Filbert Berthold / Laroy Apple, for [facility]    FEMORAL HERNIA REPAIR Left 02/20/2015    strangulated with small bowel resection - [facility],  [facility], [id number]    HYSTERECTOMY (CERVIX STATUS UNKNOWN)  1980    Done for endometriosis; done in [place]    LUMBAR SPINE SURGERY  01/03/2019    VERTEBRAL AUMENTATION  L1      OTHER SURGICAL HISTORY  11/14/13, 06/23/11, 06/30/11, 07/12/11 12/29/11    L4/L5 IESI    OTHER SURGICAL HISTORY  12/17/2013    L5/S1 IESI    OTHER SURGICAL HISTORY Bilateral 05/30/2014    SIJ and piriformis    OTHER SURGICAL HISTORY Bilateral 09/16/2014    L3, L4, L5 Diagnostic Medial Branch Block    OTHER SURGICAL HISTORY Bilateral 09/26/2014    L5 TFE    OTHER SURGICAL HISTORY  10/10/2014    bil L5 TFE    OTHER SURGICAL HISTORY  10/31/2014    caudal    SMALL INTESTINE SURGERY  02/20/2015    strangulated left femoral hernia    SPINE SURGERY N/A 01/03/2019    VERTEBRAL AUMENTATION  L1  (C-ARM X2, performed by Radonna Ricker, MD at [facility] OR    TONSILLECTOMY  1946    UPPER GASTROINTESTINAL ENDOSCOPY N/A 04/24/2019    EGD BIOPSY performed by Durene Fruits, MD at [facility]     Family History   Problem Relation Age of Onset    Other Mother         ALS - diagnosed at age 43    Stroke Father         age 17    Cancer Sister         pt thinks it was liver    Cancer Brother         leukemia    Diabetes Maternal Grandfather     Other Brother         drowned    Other Brother         died at age 63; think due to spinal meningitis    Lupus Sister     Alzheimer's Disease Sister     Dementia Sister     Mult Sclerosis Nephew     Mult Sclerosis Daughter      Social History     Tobacco Use    Smoking status: Former     Current packs/day: 0.00     Average packs/day: 1 pack/day for 40.0 years (40.0  ttl pk-yrs)     Types: Cigarettes     Start date: 01/10/1953     Quit date: 01/11/1988     Years since quitting: 34.3    Smokeless tobacco: Never    Tobacco comments:     quit 25 years ago 1990   Substance Use Topics    Alcohol use: No     Alcohol/week: 0.0 standard drinks of alcohol      Current Outpatient Medications   Medication Sig Dispense Refill    b complex vitamins capsule Take 1 capsule by mouth daily      Umeclidinium-Vilanterol (ANORO ELLIPTA IN) Inhale into the lungs      Fluticasone Furoate (ARNUITY ELLIPTA IN) Inhale into the lungs      LORazepam (ATIVAN) 0.5 MG tablet Take 1 tablet by mouth nightly for 90 days. Max Daily Amount: 0.5 mg 90 tablet 0    citalopram (CELEXA) 20 MG tablet Take 1 tablet by mouth daily 90 tablet 3    donepezil (ARICEPT) 5 MG tablet Take 1 tablet by mouth nightly 90 tablet 3    atorvastatin (LIPITOR) 20 MG tablet Take 1 tablet by mouth daily 90 tablet 2    EQ 8HR ARTHRITIS PAIN RELIEF 650 MG extended release tablet TAKE 2 TABLETS BY MOUTH EVERY 8 HOURS AS NEEDED FOR PAIN FOR FEVER      latanoprost (XALATAN) 0.005 % ophthalmic solution       omeprazole (PRILOSEC) 40 MG delayed release capsule       nitroGLYCERIN (NITROSTAT) 0.4 MG SL tablet Place 1 tablet under the tongue every 5 minutes as needed for Chest pain 25 tablet 3    polyethylene glycol (GLYCOLAX) 17 GM/SCOOP powder Take 17 g by mouth daily As needed.      CRANBERRY PO Take by mouth      Cholecalciferol (VITAMIN D3) 50  MCG (2000 UT) CAPS Take by mouth      Handicap Placard MISC by Does not apply route Issue parking placard for person with disability; Applicant meets the qualifying disability criteria. Length of time expected to have disability.  Prescription expires in 5 years from issuing date. 1 each 0    CALCIUM CARBONATE-VITAMIN D PO Take 600 mg by mouth 2 times daily Contains 600 mg Calcium & 800 IU Vitamin D3.      Multiple Vitamins-Minerals (THERAPEUTIC MULTIVITAMIN-MINERALS) tablet Take 1 tablet by mouth daily       polyvinyl alcohol-povidone (HYPOTEARS) 1.4-0.6 % ophthalmic solution Place 1-2 drops into both eyes as needed      Roflumilast (DALIRESP) 500 MCG tablet Take 1 tablet by mouth Every Day (Patient not taking: Reported on 05/18/2022)      aspirin 81 MG EC tablet Take 1 tablet by mouth daily 30 tablet 0    alendronate (FOSAMAX) 70 MG tablet Take 1 tablet by mouth every 7 days (Patient not taking: Reported on 05/18/2022) 12 tablet 3     No current facility-administered medications for this visit.     Allergies   Allergen Reactions    Morphine Other (See Comments)     Night sweats,felt "loopy".    Pcn [Penicillins] Hives    Zithromax [Azithromycin] Hives    Adhesive Tape Rash       Health Maintenance   Topic Date Due    Respiratory Syncytial Virus (RSV) Pregnant or age 6 yrs+ (1 - 61-dose 60+ series) Never done    COVID-19 Vaccine (5 - 2023-24 season) 12/02/2021    Depression Screen  06/16/2022    Annual Wellness Visit (Medicare)  06/16/2022    Lipids  10/08/2022    DTaP/Tdap/Td vaccine (2 - Td or Tdap) 09/28/2027    DEXA (modify frequency per FRAX score)  Completed    Flu vaccine  Completed    Shingles vaccine  Completed    Pneumococcal 65+ years Vaccine  Completed    Hepatitis A vaccine  Aged Out    Hepatitis B vaccine  Aged Out    Hib vaccine  Aged Out    Polio vaccine  Aged Out    Meningococcal (ACWY) vaccine  Aged Out       Subjective:      Review of Systems   Cardiovascular:  Negative for chest pain.       Objective:     Vitals:    05/18/22 1144   BP: 136/74   Site: Right Upper Arm   Position: Sitting   Pulse: 54   Resp: 16   Temp: 97.7 F (36.5 C)   TempSrc: Temporal   SpO2: 94%   Weight: 57.2 kg (126 lb 1.6 oz)   Height: 1.626 m (5\' 4" )     Physical Exam    Assessment:      1. Osteopenia, unspecified location  2. Hyperlipidemia, unspecified hyperlipidemia type  3. Alzheimer's disease, unspecified (CODE) (HCC)  4. Chronic obstructive pulmonary disease, unspecified COPD type (HCC)  5. Post-menopausal  -     DEXA  AXIAL SKELETON W VERTEBRAL FX ASST; Future         Plan:      No follow-ups on file.    Orders Placed This Encounter   Procedures    DEXA AXIAL SKELETON W VERTEBRAL FX ASST     Standing Status:   Future     Standing Expiration Date:   05/18/2023     Order Specific  Question:   Reason for exam:     Answer:   osteopenia     No orders of the defined types were placed in this encounter.          Patient given educational materials - see patient instructions.  Discussed use, benefit, and side effects of prescribed medications.  All patient questions answered.  Pt voiced understanding.  Reviewed health maintenance.            Electronically signed by Belinda Fisher, DO, DO on 05/18/2022 at 12:19 PM

## 2022-05-18 NOTE — Progress Notes (Signed)
Today's Date: 05/18/2022  Patient's Name: Katie Juarez  Patient's age: 82 y.o., 02/18/1940    Subjective:  Katie Juarez is being seen in clinic today regarding   Chief Complaint   Patient presents with    Hypertension   Hyperlipidemia      she is doing well from a cardiac standpoint. No chest pain, no dyspnea, no PND, no syncope or pre-syncope, no orthopnea. She had history of HTN and medication was stopped as BP was running low. She routinely checks BP at home and is in range of 130s/70-80. She denies any headaches. She denies any fall. She had nerve ablation yesterday        Past Medical History:   has a past medical history of Basal cell carcinoma (BCC) of face, Breast cancer (HCC), Constipation, COPD (chronic obstructive pulmonary disease) (HCC), COVID-19, DDD (degenerative disc disease), Diverticulitis, Ileus (HCC), Large bowel obstruction (HCC), LBP (low back pain), Pharyngoesophageal dysphagia, and SS (spinal stenosis).    Past Surgical History:   has a past surgical history that includes Hysterectomy (1980); bladder suspension (1990); Cholecystectomy (1990); Breast lumpectomy (Right, 1999); other surgical history (11/14/13, 06/23/11, 06/30/11, 07/12/11 12/29/11); other surgical history (12/17/2013); other surgical history (Bilateral, 05/30/2014); other surgical history (Bilateral, 09/16/2014); other surgical history (Bilateral, 09/26/2014); other surgical history (10/10/2014); other surgical history (10/31/2014); Tonsillectomy (1946); Appendectomy (1956); Small intestine surgery (02/20/2015); Femoral hernia repair (Left, 02/20/2015); Excision/Biopsy (Left, 11/01/2018); Lumbar spine surgery (01/03/2019); Spine surgery (N/A, 01/03/2019); Upper gastrointestinal endoscopy (N/A, 04/24/2019); and Cardiac catheterization (12/21/2020).    Home Medications:  Prior to Admission medications    Medication Sig Start Date End Date Taking? Authorizing Provider   LORazepam (ATIVAN) 0.5 MG tablet Take 1 tablet by mouth  nightly for 90 days. Max Daily Amount: 0.5 mg 04/20/22 07/19/22  Belinda Fisher, DO   citalopram (CELEXA) 20 MG tablet Take 1 tablet by mouth daily 04/02/22   Vedia Coffer, Freddy Jaksch, DO   donepezil (ARICEPT) 5 MG tablet Take 1 tablet by mouth nightly 02/16/22   Black, Freddy Jaksch, DO   Roflumilast (DALIRESP) 500 MCG tablet Take 1 tablet by mouth Every Day 12/15/21 01/14/22  [provider]   atorvastatin (LIPITOR) 20 MG tablet Take 1 tablet by mouth daily 12/17/21   Black, Freddy Jaksch, DO   ANORO ELLIPTA 62.5-25 MCG/ACT inhaler Inhale 1 puff into the lungs daily    [provider]   aspirin 81 MG EC tablet Take 1 tablet by mouth daily 08/24/21   Diona Browner, MD   EQ 8HR ARTHRITIS PAIN RELIEF 650 MG extended release tablet TAKE 2 TABLETS BY MOUTH EVERY 8 HOURS AS NEEDED FOR PAIN FOR FEVER 08/05/21   [provider]   latanoprost (XALATAN) 0.005 % ophthalmic solution  08/09/21   [provider]   omeprazole (PRILOSEC) 40 MG delayed release capsule  08/09/21   [provider]   nitroGLYCERIN (NITROSTAT) 0.4 MG SL tablet Place 1 tablet under the tongue every 5 minutes as needed for Chest pain 06/02/21   Nolene Ebbs, APRN - CNP   polyethylene glycol (GLYCOLAX) 17 GM/SCOOP powder Take 17 g by mouth daily As needed.    [provider]   alendronate (FOSAMAX) 70 MG tablet Take 1 tablet by mouth every 7 days 04/11/20   Black, Freddy Jaksch, DO   CRANBERRY PO Take by mouth    [provider]   Cholecalciferol (VITAMIN D3) 50 MCG (2000 UT) CAPS Take by mouth    [provider]   Handicap Placard MISC by Does not apply route Issue parking placard for person with disability; Applicant meets the qualifying disability criteria. Length of time expected to have disability.  Prescription expires in 5 years from issuing date. 07/30/19   Black, Freddy Jaksch, DO   CALCIUM CARBONATE-VITAMIN D PO Take 600 mg by mouth 2 times daily Contains 600 mg Calcium & 800 IU Vitamin D3.    [provider]   Multiple Vitamins-Minerals (THERAPEUTIC MULTIVITAMIN-MINERALS) tablet Take 1 tablet by mouth daily    [provider]   polyvinyl alcohol-povidone (HYPOTEARS) 1.4-0.6 % ophthalmic solution Place 1-2 drops into both eyes as needed    [provider]       Allergies:  Morphine, Pcn [penicillins], Zithromax [azithromycin], and Adhesive tape    Social History:   reports that she quit smoking about 34 years ago. Her smoking use included cigarettes. She started smoking about 69 years ago. She has a 40.0 pack-year smoking history. She has never used smokeless tobacco. She reports that she does not drink alcohol and does not use drugs.     Family History: family history includes Alzheimer's Disease in her sister; Cancer in her brother and sister; Dementia in her sister; Diabetes in her maternal grandfather; Lupus in her sister; Mult Sclerosis in her daughter and nephew; Other in her brother, brother, and mother; Stroke in her father. No h/o sudden cardiac death.No for premature CAD    REVIEW OF SYSTEMS:    Constitutional: there has been no unanticipated weight loss. There's been No change in energy level, No change in activity level.     Eyes: No visual changes or diplopia. No scleral icterus.  ENT: No Headaches, hearing loss or vertigo. No mouth sores or sore throat.  Cardiovascular: see above  Respiratory: see above  Gastrointestinal: No abdominal pain, appetite loss, blood in stools.   Genitourinary: No dysuria, trouble voiding, or hematuria.  Musculoskeletal:  No gait disturbance, No weakness or joint complaints.  Integumentary: No rash or pruritis.  Neurological: No headache or diplopia. No tingling  Psychiatric: No anxiety, or depression.  Endocrine: No temperature intolerance.   Hematologic/Lymphatic: No abnormal bruising or bleeding, blood clots or swollen lymph nodes.  Allergic/Immunologic: No nasal congestion or hives.      PHYSICAL EXAM:      Vitals:    05/18/22 0942   BP: (!) 150/80    Pulse: 55       Constitutional and General Appearance: alert, cooperative, no distress and appears stated age  HEENT: PERRL, no cervical lymphadenopathy. No masses palpable. Normal oral mucosa  Respiratory:  Normal excursion and expansion without use of accessory muscles  Resp Auscultation: Good respiratory effort. No for increased work of breathing. On auscultation: clear to auscultation bilaterally  Cardiovascular:  Heart tones are crisp and normal. regular S1 and S2.  Jugular venous pulsation Normal  The carotid upstroke is normal in amplitude and contour without delay or bruit   Abdomen:   soft  Bowel sounds present  Extremities:   No edema  Neurological:  Alert and oriented.    Cardiac Data:  EKG: sinus bradycardia    Labs:     CBC: No results for input(s): "WBC", "HGB", "HCT", "PLT" in the last 72 hours.  BMP: No results for input(s): "NA", "K", "CO2", "BUN", "CREATININE", "LABGLOM", "GLUCOSE" in the last 72 hours.  PT/INR: No results for input(s): "PROTIME", "INR" in the last 72 hours.  FASTING LIPID PANEL:  Lab Results  Component Value Date/Time    HDL 50 10/07/2021 09:15 AM    TRIG 299 10/07/2021 09:15 AM     LIVER PROFILE:No results for input(s): "AST", "ALT", "LABALBU" in the last 72 hours.    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    Pain of both sacroiliac joints    Piriformis syndrome of left side    Lumbar radicular pain    Neural foraminal stenosis of lumbar spine    Chronic low back pain    Spinal stenosis, lumbar    Acquired absence of other specified parts of digestive tract    Arthrodesis status    Asymptomatic menopausal state    Extravasation of urine    Gastro-esophageal reflux disease without esophagitis    Hyperlipidemia, unspecified    Closed compression fracture of body of L1 vertebra (HCC)    Lumbar burst fracture, open,  initial encounter (HCC)    Pharyngoesophageal dysphagia    Anxiety    Benign essential tremor    Dizziness    Memory problem    Balance problems    Chronic cerebral ischemia    Neck stiffness    Tingling sensation    Left arm weakness    Dysphagia    Brain dysfunction    Carotid stenosis, asymptomatic, bilateral    Knee pain    Fatigue    Osteoarthrosis    Alzheimer's disease, unspecified    Chronic obstructive pulmonary disease, unspecified    Hypertensive urgency    Bradycardia    Frequent falls    Thrush, oral    Glaucoma of both eyes    Diverticulosis    Coronary artery disease involving native heart without angina pectoris    Essential hypertension      Cardiac Cath  12/2020    Minimal CAD   D1: proximal 70% but small branch   Preserved LV systolic function     Echo 08/23/21    Left Ventricle: Normal left ventricular systolic function with a visually estimated EF of 60 - 65%. EF by 2D Simpsons Biplane is 65%. Left ventricle size is normal. Mildly increased wall thickness. Diastolic dysfunction present with normal LV EF. Normal left ventricular filling pressure.    Aortic Valve: Trileaflet valve. Mild sclerosis of the aortic valve cusp. Mild regurgitation with a centrally directed jet.    Mitral Valve: Valve structure is normal. Mild annular calcification of the mitral valve. Mild regurgitation with an anterior directed jet.    Tricuspid Valve: Mild regurgitation with a centrally directed jet. The estimated RVSP is 41 mmHg. Mildly elevated RVSP.    Assessment and plan:      -CAD. Nonobstructive CAD with branch vessel disease-D1 70% small vessel on cath 12/2020. She is not taking ASA due to significant bruising.  -HTN. Not on medications currently. Medication was stopped previously due to low BP. Home readings are normal. Monitor BP at home.   -Hyperlipidemia- continue statin. LDL 15.   -Memory loss. On Aricpet  -RTC 6 months.    Pearletha Furl, MD  [facility]  [phone]

## 2022-05-26 ENCOUNTER — Encounter

## 2022-05-26 ENCOUNTER — Inpatient Hospital Stay: Admit: 2022-05-26 | Payer: MEDICARE | Attending: Family Medicine | Primary: Family Medicine

## 2022-05-26 DIAGNOSIS — Z780 Asymptomatic menopausal state: Secondary | ICD-10-CM

## 2022-05-26 NOTE — Telephone Encounter (Signed)
A user error has taken place: encounter opened in error, closed for administrative reasons.

## 2022-06-16 ENCOUNTER — Encounter

## 2022-06-16 NOTE — Telephone Encounter (Signed)
Can wait until tomorrow

## 2022-06-17 ENCOUNTER — Ambulatory Visit: Payer: MEDICARE | Primary: Family Medicine

## 2022-06-17 NOTE — Telephone Encounter (Signed)
DEXA scan from 5/16 shows osteoporosis; when compared to last DEXA scan in 09/2017, her femur T-score worsened from -1.4 to -1.9 and she was previously only in osteopenia range at time of last DEXA for total of all scores.    Please inform pt - has she been on her Fosamax consistently for the past 4.5 years, other than recently when she told me she stopped it?  If so and if she has taken compliantly, her bone density has worsened, so I'd recommend switching to Prolia injections every 6 months to see if this keeps density more stable or even improved.  However, these are injections instead of pills, and she will have to be on them lifelong and get on time for each dose, or she would be a higher risk of fracture.  If willing, will write new order now.  If not, recommend she stay on Fosamax once weekly and I will refill.      Will re-check DEXA again in 2 years.

## 2022-06-17 NOTE — Telephone Encounter (Signed)
LMTRC

## 2022-06-17 NOTE — Progress Notes (Unsigned)
LM X2 TO INITIATE AWV TELEPONE CALL.

## 2022-06-20 NOTE — Telephone Encounter (Signed)
Pt states she had run out of fosamax and wasn't taking it for about 6 months or longer. Does have her pill situation figured out now and was taking it on Sundays and now needs a refill. Is OK with re-trying taking this consistently unless KB would like her to switch to injection.

## 2022-06-21 NOTE — Telephone Encounter (Signed)
Per refill encounter from 6/6, pt has decided to re-try fosamax since she was noncompliant previously and has a better set up now for her pills so she will remember to take it. Rx request sent to KB for fosamax.

## 2022-06-21 NOTE — Telephone Encounter (Signed)
Pt calling on status of Prolia injection, please advise.

## 2022-06-22 NOTE — Telephone Encounter (Signed)
I'd recommend switching to Prolia, if covered by insurance.  We tried to send this in in 12/2019, but the cost was high - will check again and if still too high, will continue with Fosamax.  Will complete form on Friday 6/14 and submit.

## 2022-06-27 NOTE — Telephone Encounter (Signed)
Pt calling again, states someone called her and was returning call. Writer attempted to do telephone AWV 6/7 and was unable to contact. Advised I can call again when KB returns. Voiced understanding.   Discussed fosamax vs. Prolia again with pt, states she wants to do the Prolia injection instead. Advised this will not be ordered until KB returns next week and then infusion center will call to schedule. Voiced understanding. Form filled out now for signature, will fax once complete.

## 2022-06-27 NOTE — Telephone Encounter (Signed)
Form filled out, awaiting signature, will fax once completed. Pt aware KB out of office until week of 6/24.

## 2022-07-15 NOTE — Telephone Encounter (Signed)
Patient made aware that Dr. Vedia Coffer and Aundra Millet are out of the office today and will address when back in office.

## 2022-07-15 NOTE — Telephone Encounter (Signed)
Patient called concerning an injection for osteoporosis she was supposed to start getting. States she has not heard from them and would like to follow up about it.

## 2022-07-18 NOTE — Telephone Encounter (Signed)
KB has form to sign, will notify pt once signed.

## 2022-07-19 ENCOUNTER — Encounter

## 2022-07-19 NOTE — Telephone Encounter (Signed)
[place], last fill 4/11, quantity 90 for 90 days.     [facility] called requesting a refill of the below medication which has been pended for you:     Requested Prescriptions     Pending Prescriptions Disp Refills    LORazepam (ATIVAN) 0.5 MG tablet 90 tablet 0     Sig: Take 1 tablet by mouth nightly for 90 days. Max Daily Amount: 0.5 mg       Last Appointment Date: 05/18/2022  Next Appointment Date: 10/12/2022    Allergies   Allergen Reactions    Morphine Other (See Comments)     Night sweats,felt "loopy".    Pcn [Penicillins] Hives    Zithromax [Azithromycin] Hives    Adhesive Tape Rash

## 2022-07-20 ENCOUNTER — Encounter

## 2022-07-21 MED ORDER — LORAZEPAM 0.5 MG PO TABS
0.5 | ORAL_TABLET | Freq: Every evening | ORAL | 0 refills | Status: AC
Start: 2022-07-21 — End: 2022-10-19

## 2022-07-21 NOTE — Telephone Encounter (Signed)
Controlled Substance Monitoring:    Acute and Chronic Pain Monitoring:   RX Monitoring Periodic Controlled Substance Monitoring   07/21/2022  10:26 AM No signs of potential drug abuse or diversion identified.

## 2022-07-22 ENCOUNTER — Encounter

## 2022-07-22 NOTE — Telephone Encounter (Signed)
Pt would like meds sent to [facility]. Advised we cannot send Ativan now as she is picking it up at this moment from [place]. Pt to call with 1 week left and then we can send to [facility]. Fosamax also not filled because she is doing Prolia injections. Scheduled for 7/26.    [facility] called requesting a refill of the below medication which has been pended for you:     Requested Prescriptions     Pending Prescriptions Disp Refills    citalopram (CELEXA) 20 MG tablet [Pharmacy Med Name: CITALOPRAM HYDROBROMIDE TABS 20MG ]  0    donepezil (ARICEPT) 5 MG tablet [Pharmacy Med Name: DONEPEZIL HCL TABS 5MG ]  0     Refused Prescriptions Disp Refills    LORazepam (ATIVAN) 0.5 MG tablet [Pharmacy Med Name: LORAZEPAM TABS 0.5MG ]  0    alendronate (FOSAMAX) 70 MG tablet [Pharmacy Med Name: ALENDRONATE SODIUM TABS 12'S 70MG ]  0       Last Appointment Date: 05/18/2022  Next Appointment Date: 10/12/2022    Allergies   Allergen Reactions    Morphine Other (See Comments)     Night sweats,felt "loopy".    Pcn [Penicillins] Hives    Zithromax [Azithromycin] Hives    Adhesive Tape Rash

## 2022-07-24 MED ORDER — CITALOPRAM HYDROBROMIDE 20 MG PO TABS
20 | ORAL_TABLET | Freq: Every day | ORAL | 3 refills | Status: AC
Start: 2022-07-24 — End: ?

## 2022-07-24 MED ORDER — DONEPEZIL HCL 5 MG PO TABS
5 MG | ORAL_TABLET | Freq: Every evening | ORAL | 3 refills | Status: AC
Start: 2022-07-24 — End: 2022-12-26

## 2022-08-03 NOTE — Progress Notes (Signed)
Patient Assistance    Reviewed; no assistance required.

## 2022-08-05 ENCOUNTER — Inpatient Hospital Stay: Admit: 2022-08-05 | Payer: MEDICARE | Primary: Family Medicine

## 2022-08-05 DIAGNOSIS — M810 Age-related osteoporosis without current pathological fracture: Secondary | ICD-10-CM

## 2022-08-05 LAB — COMPREHENSIVE METABOLIC PANEL
ALT: 12 U/L (ref 5–33)
AST: 17 U/L (ref <32)
Albumin/Globulin Ratio: 1.8 (ref 1.0–2.5)
Albumin: 4.2 g/dL (ref 3.5–5.2)
Alkaline Phosphatase: 60 U/L (ref 35–104)
Anion Gap: 9 mmol/L (ref 9–17)
BUN/Creatinine Ratio: 36 — ABNORMAL HIGH (ref 9–20)
BUN: 25 mg/dL — ABNORMAL HIGH (ref 8–23)
CO2: 28 mmol/L (ref 20–31)
Calcium: 9.5 mg/dL (ref 8.6–10.4)
Chloride: 103 mmol/L (ref 98–107)
Creatinine: 0.7 mg/dL (ref 0.5–0.9)
Est, Glom Filt Rate: 86 mL/min/{1.73_m2} (ref >60)
Glucose: 98 mg/dL (ref 70–99)
Potassium: 4.2 mmol/L (ref 3.7–5.3)
Sodium: 140 mmol/L (ref 135–144)
Total Bilirubin: 0.4 mg/dL (ref 0.3–1.2)
Total Protein: 6.6 g/dL (ref 6.4–8.3)

## 2022-08-05 MED ORDER — DENOSUMAB 60 MG/ML SC SOSY
60 | Freq: Once | SUBCUTANEOUS | Status: AC
Start: 2022-08-05 — End: 2022-08-05
  Administered 2022-08-05: 15:00:00 60 mg via SUBCUTANEOUS

## 2022-08-05 MED FILL — PROLIA 60 MG/ML SC SOSY: 60 MG/ML | SUBCUTANEOUS | Qty: 1

## 2022-08-05 NOTE — Other (Signed)
Chaplain rounding on PCU    Assessment: Patient is waiting for infusion to begin. No spiritual concerns expressed.    Intervention: Engaged in conversation. Patient expressed appreciation for visit and offer of continued prayer.    Plan: Chaplains are available on site or on call 24/7 for spiritual and emotional support.   08/05/22 1028   Encounter Summary   Encounter Overview/Reason Spiritual/Emotional Needs   Service Provided For Patient   Referral/Consult From Rounding   Support System Children   Last Encounter  08/05/22   Complexity of Encounter Low   Begin Time 1004   End Time  1009   Total Time Calculated 5 min   Spiritual/Emotional needs   Type Spiritual Support   Assessment/Intervention/Outcome   Assessment Calm   Intervention Active listening;Sustaining Presence/Ministry of presence   Outcome Engaged in conversation;Expressed Gratitude

## 2022-08-05 NOTE — Progress Notes (Signed)
Patient came in and CMP drawn from right antecubital.  Calcium and creatinine clearance WNL. Injection givne in right arm SUB Q.  Tolerated without complaint.  Patient will be in [place] in January and will not be back until April.  Made next injection for April.  Left message with Dr. Shade Flood office informing them that next shot will be 3 months late.

## 2022-10-12 ENCOUNTER — Ambulatory Visit: Admit: 2022-10-12 | Discharge: 2022-10-12 | Payer: MEDICARE | Attending: Family Medicine | Primary: Family Medicine

## 2022-10-12 DIAGNOSIS — Z23 Encounter for immunization: Secondary | ICD-10-CM

## 2022-10-12 MED ORDER — LORAZEPAM 0.5 MG PO TABS
0.5 | ORAL_TABLET | Freq: Every evening | ORAL | 0 refills | Status: DC
Start: 2022-10-12 — End: 2022-12-26

## 2022-10-12 NOTE — Progress Notes (Signed)
 [facility]  [address]  [place], [place] [address]  [phone]      Katie Juarez is a 82 y.o. female who presents today for her medical conditions/complaints as noted below.  Katie Juarez is c/o of Back Pain (F/u) and Foot Pain (F/u )      HPI:     Pt here today for follow-up of foot pain.    Pt saw Pain Mgmt yesterday - has a nondisplaced fracture in her L 2nd metatarsal.  Was given a walking boot to try wearing, but states she cannot walk well in this and will be taking it back today.  Was supposed to try this and see them for f/u in 1 month.  Plans to get a surgical shoe to try instead.  Unsure of how she developed the fracture; no trauma.    Also seeing Pain Mgmt for injections - recently had injection in her lower cervical spine, which did help a little.  Plans to have one in her lumbar spine in a few weeks.  Taking Tylenol  and Ibuprofen  (alternating) through the day.      Seeing Dr. Liz (Pulm) every 6 months - still using Anoro 1 puff daily and Arnuity inhaler 1 puff daily.  Still coughs and brings up liquid/water , but now only once per day.    Still taking Omeprazole  40 mg BID.    Taking Celexa  20 mg daily and Ativan  0.5 mg nightly for anxiety - stable; still feels these are doing well for her.    Still taking Donepezil  5 mg nightly for memory - feels memory has been stable.  However, daughter feels that her memory has been worse, typically in the evenings or if she is in more pain.  States she seems like she cannot focus well and seems more anxious.        Past Medical History:   Diagnosis Date    Basal cell carcinoma (BCC) of face 10/2018    excised by Dr Bethel     Breast cancer [facility]) 1999    in remission    Constipation 12/27/2018    COPD (chronic obstructive pulmonary disease) (HCC)     COVID-19 11/06/2020    DDD (degenerative disc disease)     Diverticulitis 06/28/2018    Ileus (HCC) 08/07/2021    Large bowel obstruction (HCC) 02/2015    [place]     LBP (low back pain)     Osteoporosis     per DEXA 05/26/22    Pharyngoesophageal dysphagia     SS (spinal stenosis)       Past Surgical History:   Procedure Laterality Date    APPENDECTOMY  1956    BLADDER SUSPENSION  1990    BREAST LUMPECTOMY Right 1999    CARDIAC CATHETERIZATION  12/21/2020    Dr Glady    CHOLECYSTECTOMY  1990    EXCISION/BIOPSY Left 11/01/2018    Dr Bethel / Venancio, for [facility]    FEMORAL HERNIA REPAIR Left 02/20/2015    strangulated with small bowel resection - [place] ,  [facility], [id number]    HYSTERECTOMY (CERVIX STATUS UNKNOWN)  1980    Done for endometriosis; done in [place]    LUMBAR SPINE SURGERY  01/03/2019    VERTEBRAL AUMENTATION  L1      OTHER SURGICAL HISTORY  11/14/13, 06/23/11, 06/30/11, 07/12/11 12/29/11    L4/L5 IESI    OTHER SURGICAL HISTORY  12/17/2013    L5/S1  IESI    OTHER SURGICAL HISTORY Bilateral 05/30/2014    SIJ and piriformis    OTHER SURGICAL HISTORY Bilateral 09/16/2014    L3, L4, L5 Diagnostic Medial Branch Block    OTHER SURGICAL HISTORY Bilateral 09/26/2014    L5 TFE    OTHER SURGICAL HISTORY  10/10/2014    bil L5 TFE    OTHER SURGICAL HISTORY  10/31/2014    caudal    SMALL INTESTINE SURGERY  02/20/2015    strangulated left femoral hernia    SPINE SURGERY N/A 01/03/2019    VERTEBRAL AUMENTATION  L1  (C-ARM X2, performed by Debby KANDICE Mitts, MD at [facility] OR    TONSILLECTOMY  1946    UPPER GASTROINTESTINAL ENDOSCOPY N/A 04/24/2019    EGD BIOPSY performed by Reyes DELENA Griffin, MD at [facility] OR     Family History   Problem Relation Age of Onset    Other Mother         ALS - diagnosed at age 429    Stroke Father         age 61    Cancer Sister         pt thinks it was liver    Cancer Brother         leukemia    Diabetes Maternal Grandfather     Other Brother         drowned    Other Brother         died at age 42; think due to spinal meningitis    Lupus Sister     Alzheimer's Disease Sister     Dementia Sister     Mult Sclerosis Nephew     Mult Sclerosis Daughter       Social History     Tobacco Use    Smoking status: Former     Current packs/day: 0.00     Average packs/day: 1 pack/day for 40.0 years (40.0 ttl pk-yrs)     Types: Cigarettes     Start date: 01/10/1953     Quit date: 01/11/1988     Years since quitting: 34.7    Smokeless tobacco: Never    Tobacco comments:     quit 25 years ago 1990   Substance Use Topics    Alcohol use: No     Alcohol/week: 0.0 standard drinks of alcohol      Current Outpatient Medications   Medication Sig Dispense Refill    LORazepam  (ATIVAN ) 0.5 MG tablet Take 1 tablet by mouth nightly for 90 days. Max Daily Amount: 0.5 mg 90 tablet 0    citalopram  (CELEXA ) 20 MG tablet Take 1 tablet by mouth daily 90 tablet 3    donepezil  (ARICEPT ) 5 MG tablet Take 1 tablet by mouth nightly 90 tablet 3    b complex vitamins capsule Take 1 capsule by mouth daily      Umeclidinium-Vilanterol (ANORO ELLIPTA  IN) Inhale into the lungs      Fluticasone  Furoate (ARNUITY ELLIPTA  IN) Inhale into the lungs      atorvastatin  (LIPITOR ) 20 MG tablet Take 1 tablet by mouth daily 90 tablet 2    EQ 8HR ARTHRITIS PAIN RELIEF 650 MG extended release tablet TAKE 2 TABLETS BY MOUTH EVERY 8 HOURS AS NEEDED FOR PAIN FOR FEVER      latanoprost  (XALATAN ) 0.005 % ophthalmic solution       omeprazole  (PRILOSEC) 40 MG delayed release capsule Take 1 capsule by mouth 2 times daily (before meals)  nitroGLYCERIN  (NITROSTAT ) 0.4 MG SL tablet Place 1 tablet under the tongue every 5 minutes as needed for Chest pain 25 tablet 3    polyethylene glycol (GLYCOLAX ) 17 GM/SCOOP powder Take 17 g by mouth daily As needed.      CRANBERRY PO Take by mouth      Cholecalciferol (VITAMIN D3) 50 MCG (2000 UT) CAPS Take by mouth      Handicap Placard MISC by Does not apply route Issue parking placard for person with disability; Applicant meets the qualifying disability criteria. Length of time expected to have disability.  Prescription expires in 5 years from issuing date. 1 each 0    CALCIUM   CARBONATE-VITAMIN D PO Take 600 mg by mouth 2 times daily Contains 600 mg Calcium  & 800 IU Vitamin D3.      Multiple Vitamins-Minerals (THERAPEUTIC MULTIVITAMIN-MINERALS) tablet Take 1 tablet by mouth daily      polyvinyl alcohol-povidone (HYPOTEARS) 1.4-0.6 % ophthalmic solution Place 1-2 drops into both eyes as needed      INCRUSE ELLIPTA  62.5 MCG/ACT inhaler Inhale 1 puff into the lungs daily       No current facility-administered medications for this visit.     Allergies   Allergen Reactions    Morphine Other (See Comments)     Night sweats,felt loopy.    Pcn [Penicillins] Hives    Zithromax [Azithromycin] Hives    Adhesive Tape Rash       Health Maintenance   Topic Date Due    Respiratory Syncytial Virus (RSV) Pregnant or age 44 yrs+ (1 - 1-dose 60+ series) Never done    Annual Wellness Visit (Medicare)  06/16/2022    Lipids  10/08/2022    COVID-19 Vaccine (9 - 2023-24 season) 02/12/2023    Depression Screen  05/18/2023    DTaP/Tdap/Td vaccine (2 - Td or Tdap) 09/28/2027    DEXA (modify frequency per FRAX score)  Completed    Flu vaccine  Completed    Shingles vaccine  Completed    Pneumococcal 65+ years Vaccine  Completed    Hepatitis A vaccine  Aged Out    Hepatitis B vaccine  Aged Out    Hib vaccine  Aged Out    Polio vaccine  Aged Out    Meningococcal (ACWY) vaccine  Aged Out       Subjective:      Review of Systems   Neurological:  Positive for tremors (stable).   Psychiatric/Behavioral:  The patient is nervous/anxious (stable).        Objective:     Vitals:    10/12/22 1120   BP: 120/88   Site: Right Upper Arm   Position: Sitting   Pulse: 65   Resp: 16   Temp: 97.6 F (36.4 C)   TempSrc: Temporal   SpO2: 94%   Weight: 61.7 kg (136 lb)   Height: 1.626 m (5' 4)     Physical Exam  Vitals and nursing note reviewed.   Constitutional:       General: She is not in acute distress.     Appearance: Normal appearance.   HENT:      Head: Normocephalic and atraumatic.   Eyes:      Conjunctiva/sclera: Conjunctivae  normal.   Cardiovascular:      Rate and Rhythm: Normal rate and regular rhythm.      Heart sounds: Normal heart sounds.   Pulmonary:      Effort: Pulmonary effort is normal. No respiratory distress.  Breath sounds: Normal breath sounds.   Abdominal:      General: Bowel sounds are normal. There is no distension.      Palpations: Abdomen is soft.      Tenderness: There is no abdominal tenderness.   Musculoskeletal:      Right lower leg: No edema.      Left lower leg: No edema.   Skin:     General: Skin is warm and dry.   Neurological:      General: No focal deficit present.      Mental Status: She is alert and oriented to person, place, and time.      Motor: Tremor (stable - hands) present.   Psychiatric:         Mood and Affect: Mood normal.         Assessment:      1. Essential hypertension  2. Anxiety  -     LORazepam  (ATIVAN ) 0.5 MG tablet; Take 1 tablet by mouth nightly for 90 days. Max Daily Amount: 0.5 mg, Disp-90 tablet, R-0Normal  3. Hyperlipidemia, unspecified hyperlipidemia type  4. Memory problem  5. Need for influenza vaccination  -     Influenza, FLUAD Trivalent, (age 63 y+), IM, Preservative Free, 0.5mL  6. Need for COVID-19 vaccine  -     COVID-19, COMIRNATY, (age 12y+), IM, PF, 67mcg/0.3mL         Plan:      Return in about 2 months (around 12/26/2022) for follow-up meds.    Orders Placed This Encounter   Procedures    COVID-19, COMIRNATY, (age 12y+), IM, PF, 25mcg/0.3mL    Influenza, FLUAD Trivalent, (age 11 y+), IM, Preservative Free, 0.5mL     Orders Placed This Encounter   Medications    LORazepam  (ATIVAN ) 0.5 MG tablet     Sig: Take 1 tablet by mouth nightly for 90 days. Max Daily Amount: 0.5 mg     Dispense:  90 tablet     Refill:  0         Discussed use of prescribed medications.  All patient questions answered.  Pt voiced understanding.  Reviewed health maintenance.          Electronically signed by Wonda MARLA Ka, DO, DO on 10/19/2022 at 10:40 AM

## 2022-10-17 ENCOUNTER — Ambulatory Visit: Payer: MEDICARE | Primary: Family Medicine

## 2022-11-21 ENCOUNTER — Ambulatory Visit: Admit: 2022-11-21 | Discharge: 2022-11-21 | Payer: MEDICARE | Attending: Vascular Surgery | Primary: Family Medicine

## 2022-11-21 VITALS — BP 112/80 | HR 64 | Ht 64.0 in | Wt 138.0 lb

## 2022-11-21 DIAGNOSIS — I2510 Atherosclerotic heart disease of native coronary artery without angina pectoris: Secondary | ICD-10-CM

## 2022-11-21 NOTE — Progress Notes (Signed)
Today's Date: 11/21/2022  Patient's Name: Katie Juarez  Patient's age: 82 y.o., 31-Jan-1940    Subjective:  Katie Juarez is being seen in clinic today regarding   Chief Complaint   Patient presents with    Coronary Artery Disease    6 Month Follow-Up    CAD      Seen and examined with daughter in room asking why they are here, denies  any chest pain or discomfort. No orthopnea or PND. Denise any palpitation, dizziness or syncope.  completely asymptomatic from cardiac stand point.      Past Medical History:   has a past medical history of Basal cell carcinoma (BCC) of face, Breast cancer (HCC), Constipation, COPD (chronic obstructive pulmonary disease) (HCC), COVID-19, DDD (degenerative disc disease), Diverticulitis, Ileus (HCC), Large bowel obstruction (HCC), LBP (low back pain), Osteoporosis, Pharyngoesophageal dysphagia, and SS (spinal stenosis).    Past Surgical History:   has a past surgical history that includes Hysterectomy (1980); bladder suspension (1990); Cholecystectomy (1990); Breast lumpectomy (Right, 1999); other surgical history (11/14/13, 06/23/11, 06/30/11, 07/12/11 12/29/11); other surgical history (12/17/2013); other surgical history (Bilateral, 05/30/2014); other surgical history (Bilateral, 09/16/2014); other surgical history (Bilateral, 09/26/2014); other surgical history (10/10/2014); other surgical history (10/31/2014); Tonsillectomy (1946); Appendectomy (1956); Small intestine surgery (02/20/2015); Femoral hernia repair (Left, 02/20/2015); Excision/Biopsy (Left, 11/01/2018); Lumbar spine surgery (01/03/2019); Spine surgery (N/A, 01/03/2019); Upper gastrointestinal endoscopy (N/A, 04/24/2019); and Cardiac catheterization (12/21/2020).    Home Medications:  Prior to Admission medications    Medication Sig Start Date End Date Taking? Authorizing Provider   INCRUSE ELLIPTA 62.5 MCG/ACT inhaler Inhale 1 puff into the lungs daily   Yes [provider]   LORazepam (ATIVAN) 0.5 MG  tablet Take 1 tablet by mouth nightly for 90 days. Max Daily Amount: 0.5 mg 10/12/22 01/10/23 Yes Black, Freddy Jaksch, DO   citalopram (CELEXA) 20 MG tablet Take 1 tablet by mouth daily 07/24/22  Yes Black, Freddy Jaksch, DO   donepezil (ARICEPT) 5 MG tablet Take 1 tablet by mouth nightly 07/24/22  Yes Black, Karin K, DO   b complex vitamins capsule Take 1 capsule by mouth daily   Yes [provider]   Fluticasone Furoate (ARNUITY ELLIPTA IN) Inhale into the lungs   Yes [provider]   atorvastatin (LIPITOR) 20 MG tablet Take 1 tablet by mouth daily 12/17/21  Yes Black, Karin K, DO   EQ 8HR ARTHRITIS PAIN RELIEF 650 MG extended release tablet TAKE 2 TABLETS BY MOUTH EVERY 8 HOURS AS NEEDED FOR PAIN FOR FEVER 08/05/21  Yes [provider]   latanoprost (XALATAN) 0.005 % ophthalmic solution  08/09/21  Yes [provider]   omeprazole (PRILOSEC) 40 MG delayed release capsule Take 1 capsule by mouth 2 times daily (before meals) 08/09/21  Yes [provider]   polyethylene glycol (GLYCOLAX) 17 GM/SCOOP powder Take 17 g by mouth daily As needed.   Yes [provider]   CRANBERRY PO Take 1 capsule by mouth as needed   Yes [provider]   Cholecalciferol (VITAMIN D3) 50 MCG (2000 UT) CAPS Take 1 capsule by mouth daily   Yes [provider]   CALCIUM CARBONATE-VITAMIN D PO Take 600 mg by mouth 2 times daily Contains 600 mg Calcium & 800 IU Vitamin D3.   Yes [provider]   Multiple Vitamins-Minerals (THERAPEUTIC MULTIVITAMIN-MINERALS) tablet Take 1 tablet by mouth daily   Yes [provider]   polyvinyl alcohol-povidone (HYPOTEARS) 1.4-0.6 % ophthalmic  solution Place 1-2 drops into both eyes as needed   Yes [provider]   docusate sodium (STOOL SOFTENER) 100 MG capsule Take 1 capsule by mouth as needed    [provider]   famotidine (PEPCID) 40 MG tablet Take 1 tablet by mouth daily 10/26/22   [provider]    nitroGLYCERIN (NITROSTAT) 0.4 MG SL tablet Place 1 tablet under the tongue every 5 minutes as needed for Chest pain  Patient not taking: Reported on 11/21/2022 06/02/21   Nolene Ebbs, APRN - CNP   Handicap Placard MISC by Does not apply route Issue parking placard for person with disability; Applicant meets the qualifying disability criteria. Length of time expected to have disability.  Prescription expires in 5 years from issuing date. 07/30/19   Black, Freddy Jaksch, DO       Allergies:  Morphine, Pcn [penicillins], Zithromax [azithromycin], and Adhesive tape    Social History:   reports that she quit smoking about 34 years ago. Her smoking use included cigarettes. She started smoking about 69 years ago. She has a 40 pack-year smoking history. She has been exposed to tobacco smoke. She has never used smokeless tobacco. She reports that she does not drink alcohol and does not use drugs.     Family History: family history includes Alzheimer's Disease in her sister; Cancer in her brother and sister; Dementia in her sister; Diabetes in her maternal grandfather; Lupus in her sister; Mult Sclerosis in her daughter and nephew; Other in her brother, brother, and mother; Stroke in her father. No h/o sudden cardiac death.No for premature CAD    REVIEW OF SYSTEMS:    Constitutional: there has been no unanticipated weight loss. There's been No change in energy level, No change in activity level.     Eyes: No visual changes or diplopia. No scleral icterus.  ENT: No Headaches, hearing loss or vertigo. No mouth sores or sore throat.  Cardiovascular: see above  Respiratory: see above  Gastrointestinal: No abdominal pain, appetite loss, blood in stools.   Genitourinary: No dysuria, trouble voiding, or hematuria.  Musculoskeletal:  No gait disturbance, No weakness or joint complaints.  Integumentary: No rash or pruritis.  Neurological: No headache or diplopia. No tingling  Psychiatric: No anxiety, or depression.  Endocrine: No  temperature intolerance.   Hematologic/Lymphatic: No abnormal bruising or bleeding, blood clots or swollen lymph nodes.  Allergic/Immunologic: No nasal congestion or hives.      PHYSICAL EXAM:      Vitals:    11/21/22 1036   BP: 112/80   Pulse: 64         Constitutional and General Appearance: alert, cooperative, no distress and appears stated age  HEENT: PERRL, no cervical lymphadenopathy. No masses palpable. Normal oral mucosa  Respiratory:  Normal excursion and expansion without use of accessory muscles  Resp Auscultation: Good respiratory effort. No for increased work of breathing. On auscultation: clear to auscultation bilaterally  Cardiovascular:  Heart tones are crisp and normal. regular S1 and S2.  Jugular venous pulsation Normal  The carotid upstroke is normal in amplitude and contour without delay or bruit   Abdomen:   soft  Bowel sounds present  Extremities:   No edema  Neurological:  Alert and oriented.    Cardiac Data:  EKG: sinus bradycardia    Labs:     CBC: No results for input(s): "WBC", "HGB", "HCT", "PLT" in the last 72 hours.  BMP: No results for input(s): "NA", "K", "CO2", "  BUN", "CREATININE", "LABGLOM", "GLUCOSE" in the last 72 hours.  PT/INR: No results for input(s): "PROTIME", "INR" in the last 72 hours.  FASTING LIPID PANEL:  Lab Results   Component Value Date/Time    HDL 50 10/07/2021 09:15 AM    TRIG 299 10/07/2021 09:15 AM     LIVER PROFILE:No results for input(s): "AST", "ALT", "LABALBU" in the last 72 hours.    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    Pain of both sacroiliac joints    Piriformis syndrome of left side    Lumbar radicular pain    Neural foraminal stenosis of lumbar spine    Chronic low back pain    Spinal stenosis, lumbar    Acquired absence of other specified parts of digestive tract    Arthrodesis status     Asymptomatic menopausal state    Extravasation of urine    Gastro-esophageal reflux disease without esophagitis    Hyperlipidemia, unspecified    Closed compression fracture of body of L1 vertebra (HCC)    Lumbar burst fracture, open, initial encounter (HCC)    Pharyngoesophageal dysphagia    Anxiety    Benign essential tremor    Dizziness    Memory problem    Balance problems    Chronic cerebral ischemia    Neck stiffness    Tingling sensation    Left arm weakness    Dysphagia    Brain dysfunction    Carotid stenosis, asymptomatic, bilateral    Knee pain    Fatigue    Osteoarthrosis    Alzheimer's disease, unspecified    Chronic obstructive pulmonary disease, unspecified    Hypertensive urgency    Bradycardia    Frequent falls    Thrush, oral    Glaucoma of both eyes    Diverticulosis    Coronary artery disease involving native heart without angina pectoris    Essential hypertension    Senile osteoporosis      Cardiac Cath  12/2020    Minimal CAD   D1: proximal 70% but small branch   Preserved LV systolic function     Echo 08/23/21    Left Ventricle: Normal left ventricular systolic function with a visually estimated EF of 60 - 65%. EF by 2D Simpsons Biplane is 65%. Left ventricle size is normal. Mildly increased wall thickness. Diastolic dysfunction present with normal LV EF. Normal left ventricular filling pressure.    Aortic Valve: Trileaflet valve. Mild sclerosis of the aortic valve cusp. Mild regurgitation with a centrally directed jet.    Mitral Valve: Valve structure is normal. Mild annular calcification of the mitral valve. Mild regurgitation with an anterior directed jet.    Tricuspid Valve: Mild regurgitation with a centrally directed jet. The estimated RVSP is 41 mmHg. Mildly elevated RVSP.    Assessment and plan:      -CAD. Nonobstructive CAD with branch vessel disease-D1 70% small vessel on cath 12/2020. Asymptomatic  She is not taking ASA due to significant bruising.  --Hyperlipidemia- per PCP.    -Memory loss. On Aricpet        -RTC yearly months.    Solon Augusta, APRN - NP  [facility]  [phone]

## 2022-12-09 ENCOUNTER — Inpatient Hospital Stay
Admit: 2022-12-09 | Discharge: 2022-12-09 | Disposition: A | Discharge status: Home / Self Care | Payer: MEDICARE | Admitting: Emergency Medicine

## 2022-12-09 DIAGNOSIS — N390 Urinary tract infection, site not specified: Secondary | ICD-10-CM

## 2022-12-09 LAB — MICROSCOPIC URINALYSIS
Epithelial Cells, UA: 2 /[HPF] (ref 0–5)
RBC, UA: 10 /[HPF] (ref 0–2)
WBC, UA: 20 /[HPF] (ref 0–5)

## 2022-12-09 LAB — URINALYSIS WITH REFLEX TO CULTURE
Bilirubin, Urine: NEGATIVE — AB
Nitrite, Urine: NEGATIVE
Specific Gravity, UA: 1.025 (ref 1.005–1.030)
Urobilinogen, Urine: NORMAL U/dL (ref 0.0–1.0)
pH, Urine: 5.5 (ref 5.0–8.0)

## 2022-12-09 MED ORDER — NITROFURANTOIN MONOHYD MACRO 100 MG PO CAPS
100 | ORAL_CAPSULE | Freq: Two times a day (BID) | ORAL | 0 refills | Status: AC
Start: 2022-12-09 — End: 2022-12-14

## 2022-12-09 MED ORDER — STERILE WATER FOR INJECTION (MIXTURES ONLY)
500 MG | Freq: Once | INTRAMUSCULAR | Status: AC
Start: 2022-12-09 — End: 2022-12-09
  Administered 2022-12-09: 17:00:00 500 mg via INTRAMUSCULAR

## 2022-12-09 MED FILL — CEFTRIAXONE SODIUM 500 MG IJ SOLR: 500 MG | INTRAMUSCULAR | Qty: 500

## 2022-12-09 NOTE — ED Provider Notes (Signed)
 [facility] ED  [address] [place] [address]  Phone: [phone]        [facility] ED  EMERGENCY DEPARTMENT ENCOUNTER      Pt Name: Katie Juarez  MRN: [id number]  Birthdate 05/09/1940  Date of evaluation: 12/09/2022  Provider: Roxy Manns

## 2022-12-09 NOTE — ED Notes (Signed)
Warmed blanket given , lights dimmed , call light in reach

## 2022-12-09 NOTE — Discharge Instructions (Addendum)
 Please make an appointment to follow up with your primary doctor and/or the specialist as we discussed. Take all medications as prescribed.  Return to ER if condition worsens or you develop any new/concerning symptoms as we discussed.

## 2022-12-11 LAB — CULTURE, URINE

## 2022-12-17 LAB — URINALYSIS WITH MICROSCOPIC
Bilirubin, Urine: NEGATIVE
Casts UA: ABSENT /[LPF]
Glucose, Ur: NEGATIVE mg/dL
Ketones, Urine: NEGATIVE mg/dL
Nitrite, Urine: NEGATIVE
Specific Gravity, UA: 1.025 mg/dL (ref 1.005–1.030)
Urobilinogen, Urine: 0.2 mg/dL (ref 0.2–1.0)
pH, Urine: 6 (ref 5.0–8.5)

## 2022-12-26 ENCOUNTER — Encounter: Admit: 2022-12-26 | Admitting: Family Medicine

## 2022-12-26 ENCOUNTER — Inpatient Hospital Stay
Disposition: A | Discharge status: Home / Self Care | Payer: MEDICARE | Source: Ambulatory Visit | Admitting: Family Medicine | Primary: Family Medicine

## 2022-12-26 ENCOUNTER — Ambulatory Visit
Admit: 2022-12-26 | Discharge: 2022-12-26 | Payer: MEDICARE | Attending: Family Medicine | Admitting: Family Medicine | Primary: Family Medicine

## 2022-12-26 VITALS — BP 118/64 | HR 61 | Temp 97.70000°F | Resp 16 | Ht 64.0 in | Wt 137.0 lb

## 2022-12-26 DIAGNOSIS — E785 Hyperlipidemia, unspecified: Principal | ICD-10-CM

## 2022-12-26 DIAGNOSIS — M810 Age-related osteoporosis without current pathological fracture: Secondary | ICD-10-CM

## 2022-12-26 LAB — CBC WITH AUTO DIFFERENTIAL
Basophils %: 1 % (ref 0–2)
Basophils Absolute: 0.14 10*3/uL (ref 0.00–0.20)
Eosinophils %: 2 % (ref 1–4)
Eosinophils Absolute: 0.23 10*3/uL (ref 0.00–0.44)
Hematocrit: 40.6 % (ref 36.3–47.1)
Hemoglobin: 13.4 g/dL (ref 11.9–15.1)
Immature Granulocytes %: 4 % — ABNORMAL HIGH
Immature Granulocytes Absolute: 0.42 10*3/uL — ABNORMAL HIGH (ref 0.00–0.30)
Lymphocytes %: 17 % — ABNORMAL LOW (ref 24–43)
Lymphocytes Absolute: 1.85 10*3/uL (ref 1.10–3.70)
MCH: 32.8 pg (ref 25.2–33.5)
MCHC: 33 g/dL (ref 25.2–33.5)
MCV: 99.5 fL (ref 82.6–102.9)
MPV: 9.5 fL (ref 8.1–13.5)
Monocytes %: 10 % (ref 3–12)
Monocytes Absolute: 1.13 10*3/uL (ref 0.10–1.20)
NRBC Automated: 0 /100{WBCs}
Neutrophils %: 66 % — ABNORMAL HIGH (ref 36–65)
Neutrophils Absolute: 7.44 10*3/uL (ref 1.50–8.10)
Platelets: 218 10*3/uL (ref 138–453)
RBC: 4.08 m/uL (ref 3.95–5.11)
RDW: 14.3 % (ref 11.8–14.4)
WBC: 11.2 10*3/uL (ref 3.5–11.3)

## 2022-12-26 LAB — COMPREHENSIVE METABOLIC PANEL
ALT: 14 U/L (ref 5–33)
AST: 23 U/L (ref <32)
Albumin/Globulin Ratio: 1.4 (ref 1.0–2.5)
Albumin: 4.2 g/dL (ref 3.5–5.2)
Alkaline Phosphatase: 55 U/L (ref 35–104)
Anion Gap: 10 mmol/L (ref 9–17)
BUN/Creatinine Ratio: 31 — ABNORMAL HIGH (ref 9–20)
BUN: 22 mg/dL (ref 8–23)
CO2: 27 mmol/L (ref 20–31)
Calcium: 8.7 mg/dL (ref 8.6–10.4)
Chloride: 105 mmol/L (ref 98–107)
Creatinine: 0.7 mg/dL (ref 0.5–0.9)
Est, Glom Filt Rate: 86 mL/min/{1.73_m2} (ref >60)
Glucose: 94 mg/dL (ref 70–99)
Potassium: 4.3 mmol/L (ref 3.7–5.3)
Sodium: 142 mmol/L (ref 135–144)
Total Bilirubin: 0.3 mg/dL (ref 0.3–1.2)
Total Protein: 7.1 g/dL (ref 6.4–8.3)

## 2022-12-26 LAB — URINALYSIS WITH REFLEX TO CULTURE
Bilirubin, Urine: NEGATIVE
Glucose, Ur: NEGATIVE mg/dL
Ketones, Urine: NEGATIVE mg/dL
Leukocyte Esterase, Urine: NEGATIVE
Nitrite, Urine: NEGATIVE
Protein, UA: NEGATIVE mg/dL
Specific Gravity, UA: 1.03 — ABNORMAL HIGH (ref 1.010–1.025)
Urine Hgb: NEGATIVE
Urobilinogen, Urine: NORMAL EU/dL (ref 0.0–1.0)
pH, Urine: 5.5 (ref 5.0–6.0)

## 2022-12-26 LAB — TSH WITH REFLEX: TSH: 1.22 u[IU]/mL (ref 0.30–5.00)

## 2022-12-26 LAB — PHOSPHORUS: Phosphorus: 3.4 mg/dL (ref 2.6–4.5)

## 2022-12-26 MED ORDER — LORAZEPAM 0.5 MG PO TABS
0.5 | ORAL_TABLET | Freq: Every evening | ORAL | 0 refills | Status: AC
Start: 2022-12-26 — End: 2023-03-26

## 2022-12-26 MED ORDER — ATORVASTATIN CALCIUM 20 MG PO TABS
20 | ORAL_TABLET | Freq: Every day | ORAL | 3 refills | 90.00000 days | Status: DC
Start: 2022-12-26 — End: 2023-11-03

## 2022-12-26 MED ORDER — ATORVASTATIN CALCIUM 20 MG PO TABS
20 | ORAL_TABLET | Freq: Every day | ORAL | 3 refills | Status: DC
Start: 2022-12-26 — End: 2022-12-26

## 2022-12-26 MED ORDER — DONEPEZIL HCL 10 MG PO TABS
10 | ORAL_TABLET | Freq: Every evening | ORAL | 1 refills | Status: AC
Start: 2022-12-26 — End: ?

## 2022-12-26 NOTE — Progress Notes (Signed)
 [facility]  [address]  [place], [place] [address]  [phone]      Katie Juarez is a 82 y.o. female who presents today for her medical conditions/complaints as noted below.  Katie Juarez is c/o of f/u meds and

## 2022-12-27 ENCOUNTER — Telehealth
Admit: 2022-12-27 | Discharge: 2022-12-27 | Payer: MEDICARE | Attending: Family Medicine | Admitting: Family Medicine | Primary: Family Medicine

## 2022-12-27 DIAGNOSIS — Z0000 Encounter for general adult medical examination without abnormal findings: Secondary | ICD-10-CM

## 2022-12-27 LAB — LIPID PANEL
Chol/HDL Ratio: 3.8
Cholesterol, Total: 203 mg/dL — ABNORMAL HIGH (ref 0–199)
HDL: 53 mg/dL (ref >40)
LDL Cholesterol: 81 mg/dL (ref 0–100)
Triglycerides: 343 mg/dL — ABNORMAL HIGH (ref <150)
VLDL: 69 mg/dL — ABNORMAL HIGH (ref 1–30)

## 2022-12-27 LAB — CULTURE, URINE: Culture: NO GROWTH

## 2022-12-27 LAB — VITAMIN D 25 HYDROXY: Vit D, 25-Hydroxy: 38.1 ng/mL (ref 30.0–100.0)

## 2022-12-27 NOTE — Patient Instructions (Signed)
 Preventing Falls: Care Instructions  Injuries and health problems such as trouble walking or poor eyesight can increase your risk of falling. So can some medicines. But there are things you can do to help prevent falls. You can exercise to get stronge

## 2022-12-27 NOTE — Progress Notes (Signed)
 Medicare Annual Wellness Visit    Katie Juarez is here for Medicare AWV    Assessment & Plan   Medicare annual wellness visit, subsequent    Recommendations for Preventive Services Due: see orders and patient instructions/AVS.  Recommended screening

## 2022-12-28 LAB — LEAD, BLOOD: Lead: <2 ug/dL (ref <=4.9)

## 2023-01-11 LAB — COVID-19: SARS-CoV-2, Rapid: NEGATIVE

## 2023-01-11 LAB — INFLUENZA A & B
Flu A Antigen: NEGATIVE
Flu B Antigen: NEGATIVE

## 2023-01-25 IMAGING — CT CT PELVIS WITHOUT CONTRAST
3 of 6 series · 10 of 46 positions shown, 17 images · non-contrast
Comparison: 02/27/2022 MRI lumbar spine

________________________________________________________________________________________________ 
CT PELVIS WITHOUT CONTRAST, 01/25/2023 [DATE]: 
CLINICAL INDICATION: Sacroiliitis, not elsewhere classified  
A search for DICOM formatted images was conducted for prior CT imaging studies 
completed at a non-affiliated media free facility.
TECHNIQUE: The pelvis was scanned without contrast on a high-resolution CT 
scanner using dose reduction techniques. Routine MPR reconstructions were 
performed. Count of known CT and Cardiac Nuclear Medicine studies performed in 
the previous 12 months = 0.

[Series 3: pelvis 1.5 i31s 2 · axial · 0.74mm/px · z∈[-632,-409]mm · 6 of 209 slices shown, 11 images]
[im 30/209  soft-tissue]
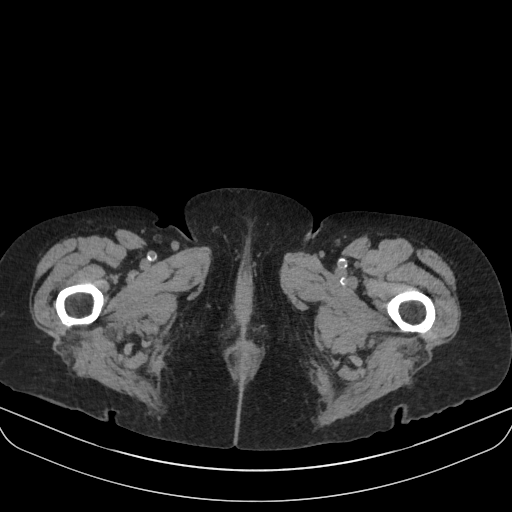
[im 30/209  bone]
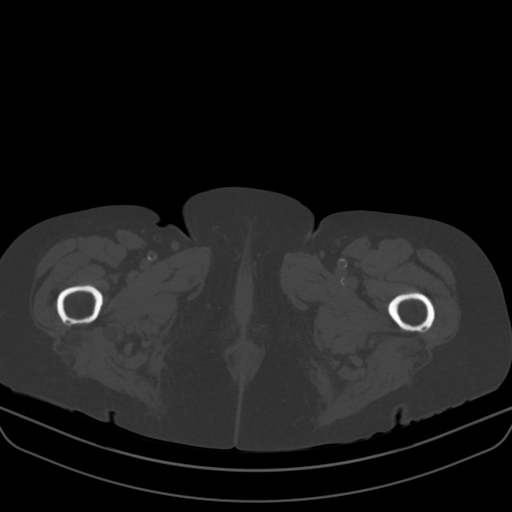
[im 60/209  soft-tissue]
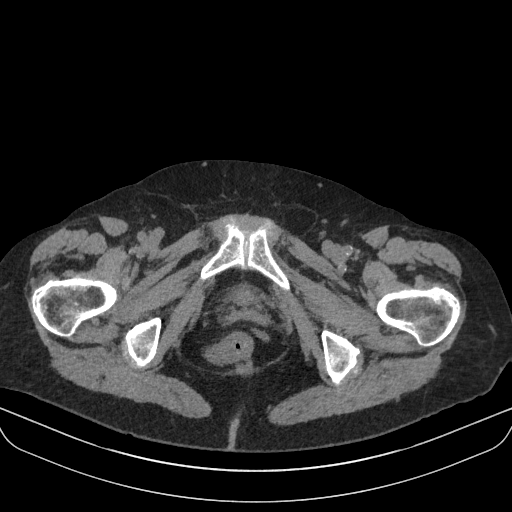
[im 90/209  soft-tissue]
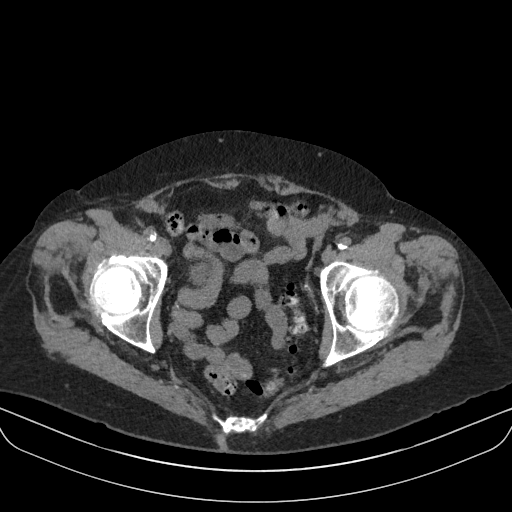
[im 90/209  lung]
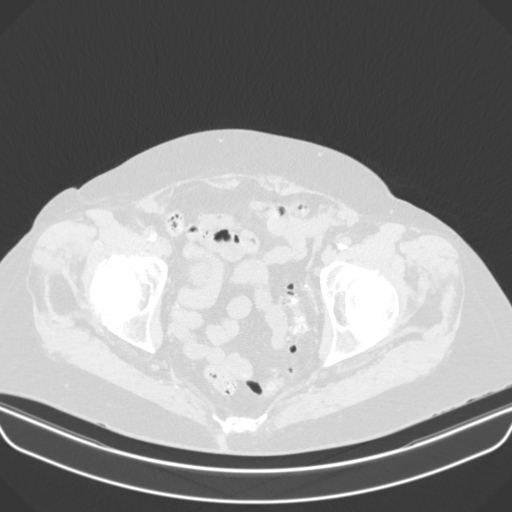
[im 119/209  soft-tissue]
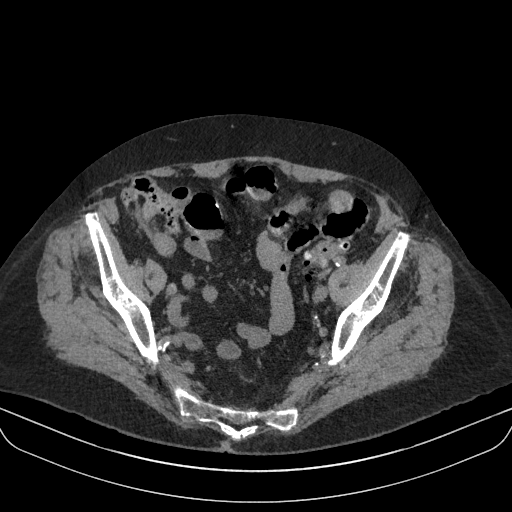
[im 119/209  lung]
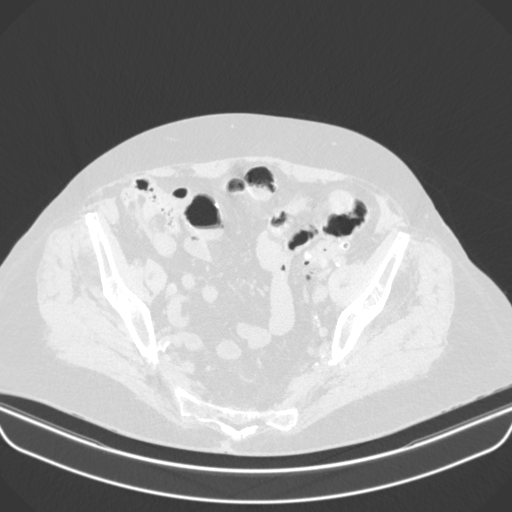
[im 149/209  soft-tissue]
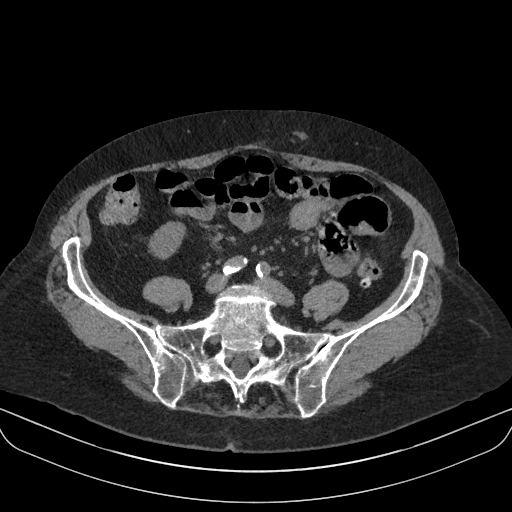
[im 149/209  lung]
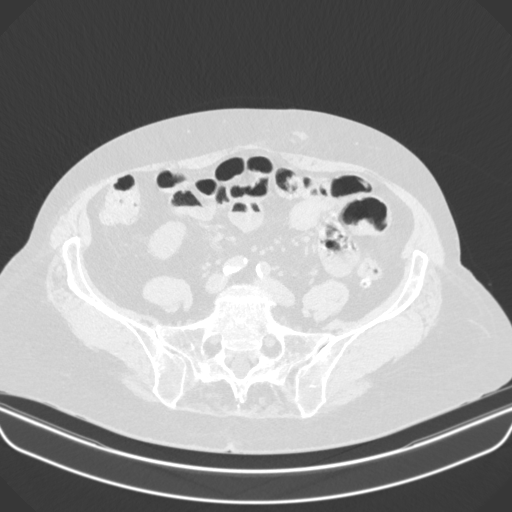
[im 179/209  soft-tissue]
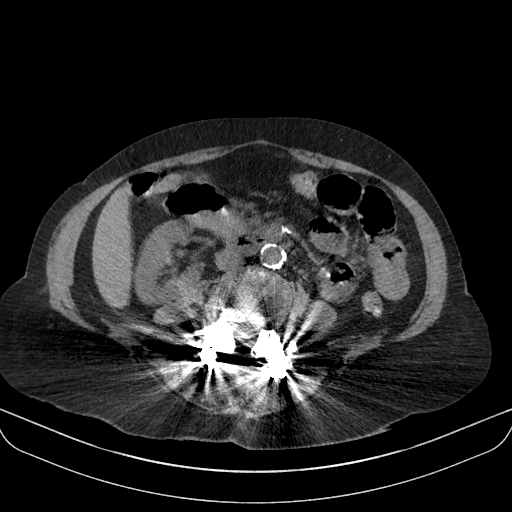
[im 179/209  lung]
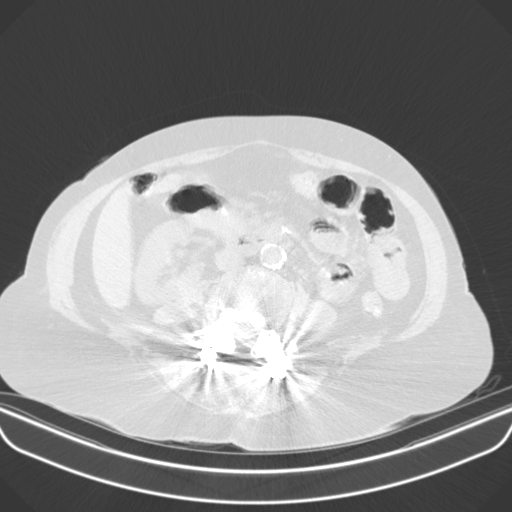

[Series 5: coronal · coronal · 0.64mm/px · 3 of 126 slices shown, 4 images]
[im 42/126  soft-tissue]
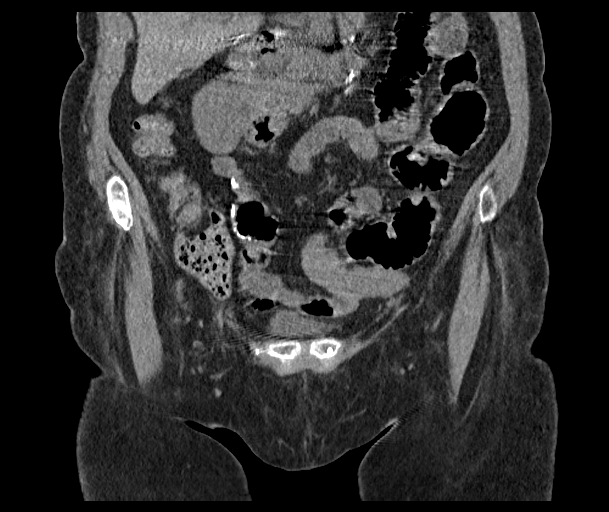
[im 56/126  soft-tissue]
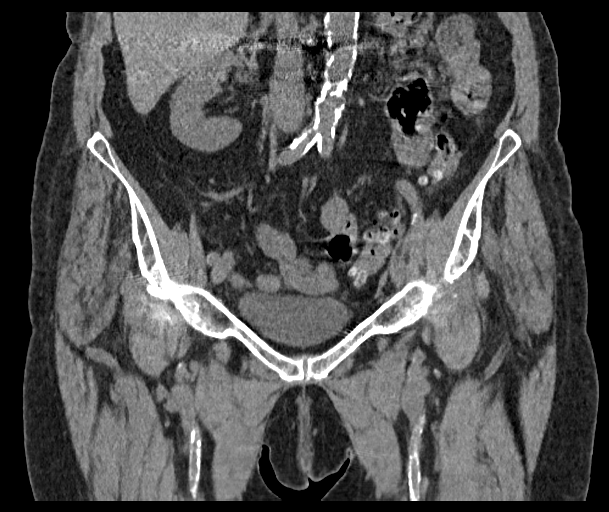
[im 56/126  bone]
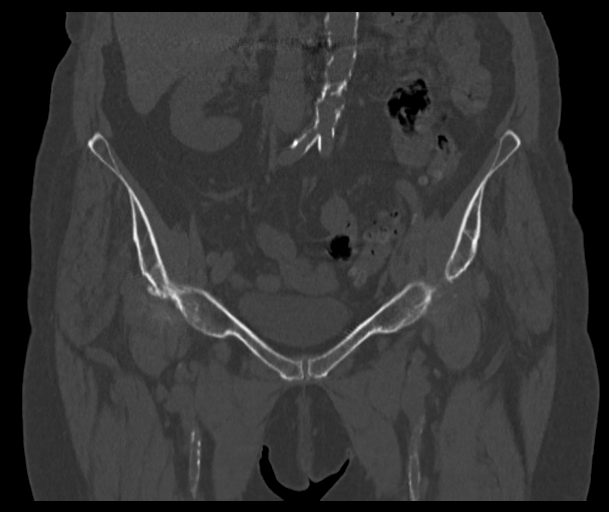
[im 70/126  soft-tissue]
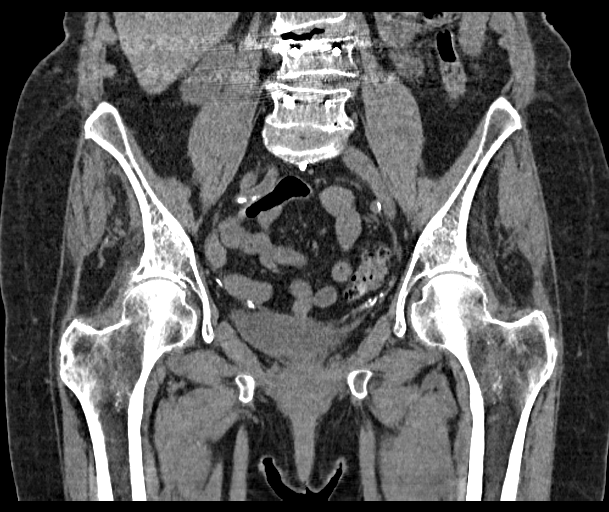

[Series 6: sagittal · sagittal · 0.54mm/px · 1 of 182 slices shown, 2 images]
[im 61/182  soft-tissue]
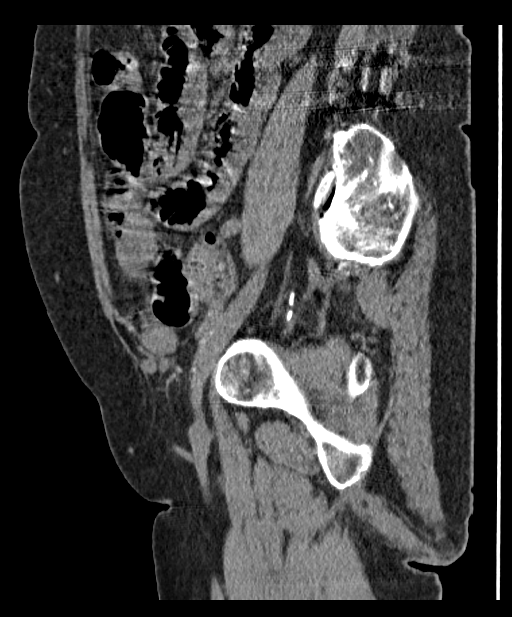
[im 61/182  bone]
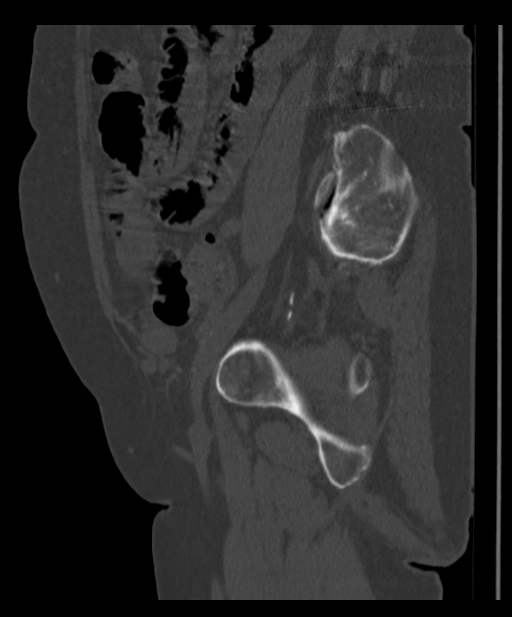

[10 of 46 positions shown; findings below may reference images not displayed]

FINDINGS: SI JOINTS/SACRUM: No sacroiliitis. Mild degenerative change of the SI joints. No 
sacral fractures. 
HIPS: Mild degenerative change of the hips and chondrocalcinosis. Both femoral 
heads maintain a spherical configuration without evidence of avascular necrosis 
or subarticular collapse. No abnormal morphology of the proximal femurs or 
acetabulum to predispose to impingement. 
BONES: No fracture or marrow replacing lesion.  
PUBIC SYMPHYSIS: Joint space width is preserved. Chondrocalcinosis.. 
SPINE: L4-5 posterior decompression, pedicle screw/rod fixation and 0.4 cm 
anterolisthesis. Multilevel degenerative change and osteopenia. 
SOFT TISSUES: Small fat-containing umbilical and left periumbilical hernias. Two 
anterior pelvic wall staples. No mass or fluid. Distal ileal resection staples. 
Colonic diverticulosis without evidence of diverticulitis. Appendectomy. 
Hysterectomy. Low-lying right kidney. Unenhanced bladder is unremarkable. No 
adenopathy or free fluid. The sclerosis.
IMPRESSION: 1.  No sacroiliitis. Mild degenerative change of the SI joints.  
2.  Mild degenerative change of the hips and chondrocalcinosis.  
3.  L4-5 posterior decompression, pedicle screw/rod fixation and 0.4 cm 
anterolisthesis. Multilevel degenerative change and osteopenia. 
4.  Small fat-containing umbilical and left periumbilical hernias.  
5.  Distal ileal resection staples, colonic diverticulosis, appendectomy, 
hysterectomy and atherosclerosis.  
RADIATION DOSE REDUCTION: All CT scans are performed using radiation dose 
reduction techniques, when applicable.  Technical factors are evaluated and 
adjusted to ensure appropriate moderation of exposure.  Automated dose 
management technology is applied to adjust the radiation doses to minimize 
exposure while achieving diagnostic quality images.

## 2023-01-29 ENCOUNTER — Encounter

## 2023-02-23 NOTE — Telephone Encounter (Signed)
Daughter Kaylon sent mychart message in her account regarding her mom. Do you have a recommendation on where to send patient when she returns to [place]?       02/23/23 11:08 AM  Hello!  I'm in [place] with my mom and she had krycloplasty at L3 yesterday. Considering bringing her home to doctor in [place]. A couple years ago you had a Dr in [place] do this for her and I was wondering his name. We would like to continue her spine care with him. Thank you so very much for any help you can give [place]!

## 2023-02-24 NOTE — Telephone Encounter (Signed)
I believe she saw Dr. Parke Simmers in 2020 - OK to see him again now if she wants.

## 2023-02-24 NOTE — Telephone Encounter (Signed)
Kaylon updated.

## 2023-03-14 ENCOUNTER — Encounter

## 2023-03-14 NOTE — Telephone Encounter (Signed)
 Please confirm - at last OV on 12/16, pt had been taking 5 mg daily, and we discussed increasing to 10 mg daily.  I sent in the 10 mg dose, but I might have said they could use up the 5 mg tabs by taking 2 of them daily.  However, she was only supposed to increase to a total dose of 10 mg.  Has she been taking 2 of the 10 mg tabs for a total of 20 mg?    If so, we need to decrease back to 10 mg daily, as that is the maximum recommended dose.  I had sent a refill on the 90-day supply I sent to [facility] on 12/16 if they want to fill that, or I can fill to pended [facility].

## 2023-03-14 NOTE — Telephone Encounter (Signed)
 Pt daughter calling requesting a refill. She states she has been out since Friday. She states the last time she came in, Dr Vedia Coffer increased the dose to 20 mg, so she has been taking two pills at night. She wants to make sure she is still to take 20 mg. Send to pended pharmacy, please advise.

## 2023-03-14 NOTE — Telephone Encounter (Signed)
 See previous entry.    Katie Juarez called requesting a refill of the below medication which has been pended for you:     Requested Prescriptions     Pending Prescriptions Disp Refills    donepezil (ARICEPT) 10 MG tablet 90 tablet 1     Sig: Take 1 tablet by mouth nightly       Last Appointment Date: 12/27/2022  Next Appointment Date: 03/20/2023    Allergies   Allergen Reactions    Morphine Other (See Comments)     Night sweats,felt "loopy".    Pcn [Penicillins] Hives    Zithromax [Azithromycin] Hives    Adhesive Tape Rash

## 2023-03-15 MED ORDER — DONEPEZIL HCL 10 MG PO TABS
10 MG | ORAL_TABLET | Freq: Every evening | ORAL | 1 refills | Status: DC
Start: 2023-03-15 — End: 2023-06-19

## 2023-03-15 NOTE — Telephone Encounter (Signed)
 Spoke with dtr Riki Sheer, they were using the rest of the 5mg  tablets and taking 2 of those for a total of 10mg . They do not have the 10mg  prescription, unable to find it if it was sent through mail. Pt is currently being cared for by other dtr in [place], per [place] please sent 10mg  rx to [facility] in [place] ([employer]). Advised pt will only be taking one of these tabs now instead of 2 and to please let other sister and pt know. Voiced understanding.

## 2023-03-20 ENCOUNTER — Ambulatory Visit: Admit: 2023-03-20 | Discharge: 2023-03-20 | Payer: MEDICARE | Attending: Family Medicine | Primary: Family Medicine

## 2023-03-20 VITALS — BP 122/80 | HR 79 | Temp 97.70000°F | Resp 20 | Wt 130.0 lb

## 2023-03-20 DIAGNOSIS — Z8679 Personal history of other diseases of the circulatory system: Secondary | ICD-10-CM

## 2023-03-20 MED ORDER — GABAPENTIN 100 MG PO CAPS
100 | ORAL_CAPSULE | Freq: Two times a day (BID) | ORAL | 0 refills | Status: AC
Start: 2023-03-20 — End: 2023-04-19

## 2023-03-20 MED ORDER — OMEPRAZOLE 40 MG PO CPDR
40 | ORAL_CAPSULE | Freq: Every day | ORAL | 0 refills | Status: DC
Start: 2023-03-20 — End: 2023-11-03

## 2023-03-20 MED ORDER — COMFORT TOUCH BP CUFF/MEDIUM MISC
0 refills | 6.00000 days | Status: DC
Start: 2023-03-20 — End: 2023-11-03

## 2023-03-20 NOTE — Progress Notes (Signed)
 [facility]  [address]  [place], [place] [address]  [phone]      Katie Juarez is a 83 y.o. female who presents today for her medical conditions/complaints as noted below.  Katie Juarez is c/o of Back Pain (F/u - would like referral to Ortho)      HPI:     History of Present Illness  The patient is an 83 year old female who presents today for follow-up of a recent hospitalization, kyphoplasty, and blood pressure management. She is accompanied by her daughter.    She underwent kyphoplasty on 02/22/2023, performed by Dr. [name], following a vertebral fracture identified through x-ray. The fracture was discovered after she experienced severe pain during pain management injections in [place]. Post-surgery, she was discharged without any postoperative care instructions or paperwork. On 02/24/2023, she was readmitted to the ER due to immobility and weakness, leading to a hospital stay from 02/24/2023 to 02/28/2023, followed by rehabilitation until 03/08/2023. During this period, she was diagnosed with pneumonia and treated with antibiotics. She was not discharged on any antibiotics. She was not treated for pneumonia at the rehab. She was on oral antibiotics. She did not go home on any antibiotics. She did not go to rehab on any antibiotics. She has been using a walker for mobility and continues to do so at home. She also uses a cane. She reports persistent pain in the same area as before. She was prescribed tramadol and a muscle relaxer for pain management during her rehabilitation stay, but these were not continued post-discharge. She prefers to avoid narcotics due to their impact on her mental state. She is currently taking Tylenol 650 mg 2 tablets 3 times daily, ibuprofen 800 mg 3 times daily, and gabapentin 300 mg at night for pain management. She occasionally requests early pain medication, prompting the use of alternating ice and heat, and Salonpas. She tries to  alternate Tylenol and ibuprofen every 4 hours. She had a bad night last night. She could not get comfortable. She sleeps a lot. Her oxygen levels have been between 89 and 94, and she requires oxygen therapy. She was not on oxygen until she got to rehab. She has a persistent cough at night and has been taking famotidine 40 mg twice daily. She has been taking omeprazole 40 mg twice daily. She has an upcoming appointment with Dr. Albertina Senegal on 04/14/2023. She reports poor appetite and right lower quadrant pain, but no bowel obstruction. She has regular bowel movements, although she experienced diarrhea yesterday. She eats 3 meals a day, including peanut butter toast in the morning. She has expressed concern about potential kidney and liver damage due to her medication regimen. She has a history of ankle swelling, which was evaluated with an x-ray. She has been experiencing worsening tremors, which persist even during sleep. She does not require assistance with showering or bathing.    Her blood pressure has been elevated since her return from [place], with readings as high as 201/110 on her wrist digital cuff. She has been taking losartan daily for the past week, with the lowest recorded reading being 156/80 or 90. She has never seen it under 150. She will check her blood pressure manually once she gets the blood pressure cuff.    She has been experiencing memory issues, which may be related to her recent anesthesia and low oxygen levels. She was off Aricept for a while but is back on it now.    She  has a persistent cough at night and has been taking famotidine 40 mg twice daily. She has been taking omeprazole 40 mg twice daily.    She has expressed concern about potential kidney and liver damage due to her medication regimen.    She has been experiencing worsening tremors, which persist even during sleep.    Supplemental Information  She had a UTI on 01/28/2023 and was on antibiotics.    MEDICATIONS  Current: Aricept,  citalopram, lorazepam, atorvastatin, gabapentin, Tylenol, ibuprofen, losartan, omeprazole, famotidine.  Past: Tramadol, Percocet.  Kyphoplasty in [place] on 2/12 - [name]  [facility] - 2/14 - 2/18  [facility] in [place] - 2/18 - 2/26    Had UTI on 1/18      [address]  [name], [name]      Past Medical History:   Diagnosis Date    Basal cell carcinoma (BCC) of face 10/2018    excised by Dr Filbert Berthold     Breast cancer [facility]) 1999    in remission    Constipation 12/27/2018    COPD (chronic obstructive pulmonary disease) (HCC)     COVID-19 11/06/2020    DDD (degenerative disc disease)     Diverticulitis 06/28/2018    Ileus (HCC) 08/07/2021    Large bowel obstruction (HCC) 02/2015    [facility]    LBP (low back pain)     Osteoporosis     per DEXA 05/26/22    Pharyngoesophageal dysphagia     SS (spinal stenosis)       Past Surgical History:   Procedure Laterality Date    APPENDECTOMY  1956    BLADDER SUSPENSION  1990    BREAST LUMPECTOMY Right 1999    CARDIAC CATHETERIZATION  12/21/2020    Dr Simone Curia    CHOLECYSTECTOMY  1990    EXCISION/BIOPSY Left 11/01/2018    Dr Filbert Berthold / Laroy Apple, for [facility]    FEMORAL HERNIA REPAIR Left 02/20/2015    strangulated with small bowel resection - [facility],  [facility], [id number]    HYSTERECTOMY (CERVIX STATUS UNKNOWN)  1980    Done for endometriosis; done in [place]    LUMBAR SPINE SURGERY  01/03/2019    VERTEBRAL AUMENTATION  L1      OTHER SURGICAL HISTORY  11/14/13, 06/23/11, 06/30/11, 07/12/11 12/29/11    L4/L5 IESI    OTHER SURGICAL HISTORY  12/17/2013    L5/S1 IESI    OTHER SURGICAL HISTORY Bilateral 05/30/2014    SIJ and piriformis    OTHER SURGICAL HISTORY Bilateral 09/16/2014    L3, L4, L5 Diagnostic Medial Branch Block    OTHER SURGICAL HISTORY Bilateral 09/26/2014    L5 TFE    OTHER SURGICAL HISTORY  10/10/2014    bil L5 TFE    OTHER SURGICAL HISTORY  10/31/2014    caudal    SMALL INTESTINE SURGERY  02/20/2015    strangulated left femoral hernia     SPINE SURGERY N/A 01/03/2019    VERTEBRAL AUMENTATION  L1  (C-ARM X2, performed by Radonna Ricker, MD at [facility] OR    TONSILLECTOMY  1946    UPPER GASTROINTESTINAL ENDOSCOPY N/A 04/24/2019    EGD BIOPSY performed by Durene Fruits, MD at [facility] OR     Family History   Problem Relation Age of Onset    Other Mother         ALS - diagnosed at age 24    Stroke Father  age 62    Cancer Sister         pt thinks it was liver    Cancer Brother         leukemia    Diabetes Maternal Grandfather     Other Brother         drowned    Other Brother         died at age 84; think due to spinal meningitis    Lupus Sister     Alzheimer's Disease Sister     Dementia Sister     Mult Sclerosis Nephew     Mult Sclerosis Daughter      Social History     Tobacco Use    Smoking status: Former     Current packs/day: 0.00     Average packs/day: 1 pack/day for 40.0 years (40.0 ttl pk-yrs)     Types: Cigarettes     Start date: 01/10/1953     Quit date: 01/11/1988     Years since quitting: 35.2     Passive exposure: Past    Smokeless tobacco: Never    Tobacco comments:     quit 25 years ago 1990   Substance Use Topics    Alcohol use: No     Alcohol/week: 0.0 standard drinks of alcohol      Current Outpatient Medications   Medication Sig Dispense Refill    gabapentin (NEURONTIN) 300 MG capsule Take 1 capsule by mouth at bedtime.      ondansetron (ZOFRAN) 4 MG tablet Take 1 tablet by mouth every 8 hours as needed      ibuprofen (ADVIL;MOTRIN) 800 MG tablet 1 tablet      Blood Pressure Monitoring (COMFORT TOUCH BP CUFF/MEDIUM) MISC Use daily to check blood pressure. 1 each 0    omeprazole (PRILOSEC) 40 MG delayed release capsule Take 1 capsule by mouth daily (with breakfast) 90 capsule 0    gabapentin (NEURONTIN) 100 MG capsule Take 1 capsule by mouth 2 times daily for 30 days. Intended supply: 30 days 60 capsule 0    donepezil (ARICEPT) 10 MG tablet Take 1 tablet by mouth nightly 90 tablet 1    atorvastatin (LIPITOR) 20 MG tablet Take 1 tablet  by mouth daily 90 tablet 3    LORazepam (ATIVAN) 0.5 MG tablet Take 1 tablet by mouth nightly for 90 days. Max Daily Amount: 0.5 mg 90 tablet 0    docusate sodium (STOOL SOFTENER) 100 MG capsule Take 1 capsule by mouth as needed      famotidine (PEPCID) 40 MG tablet Take 1 tablet by mouth daily (Patient taking differently: Take 1 tablet by mouth 2 times daily)      INCRUSE ELLIPTA 62.5 MCG/ACT inhaler Inhale 1 puff into the lungs daily      citalopram (CELEXA) 20 MG tablet Take 1 tablet by mouth daily 90 tablet 3    b complex vitamins capsule Take 1 capsule by mouth daily      Fluticasone Furoate (ARNUITY ELLIPTA IN) Inhale into the lungs      EQ 8HR ARTHRITIS PAIN RELIEF 650 MG extended release tablet TAKE 2 TABLETS BY MOUTH EVERY 8 HOURS AS NEEDED FOR PAIN FOR FEVER      latanoprost (XALATAN) 0.005 % ophthalmic solution       nitroGLYCERIN (NITROSTAT) 0.4 MG SL tablet Place 1 tablet under the tongue every 5 minutes as needed for Chest pain 25 tablet 3    polyethylene glycol (GLYCOLAX) 17 GM/SCOOP powder  Take 17 g by mouth daily As needed.      CRANBERRY PO Take 1 capsule by mouth as needed      Cholecalciferol (VITAMIN D3) 50 MCG (2000 UT) CAPS Take 1 capsule by mouth daily      Handicap Placard MISC by Does not apply route Issue parking placard for person with disability; Applicant meets the qualifying disability criteria. Length of time expected to have disability.  Prescription expires in 5 years from issuing date. 1 each 0    CALCIUM CARBONATE-VITAMIN D PO Take 600 mg by mouth 2 times daily Contains 600 mg Calcium & 800 IU Vitamin D3.      Multiple Vitamins-Minerals (THERAPEUTIC MULTIVITAMIN-MINERALS) tablet Take 1 tablet by mouth daily      polyvinyl alcohol-povidone (HYPOTEARS) 1.4-0.6 % ophthalmic solution Place 1-2 drops into both eyes as needed       No current facility-administered medications for this visit.     Allergies   Allergen Reactions    Morphine Other (See Comments)     Night sweats,felt  "loopy".    Pcn [Penicillins] Hives    Zithromax [Azithromycin] Hives    Adhesive Tape Rash       Health Maintenance   Topic Date Due    COVID-19 Vaccine (9 - 2024-25 season) 04/12/2023    Lipids  12/26/2023    Annual Wellness Visit (Medicare)  12/28/2023    Depression Screen  03/19/2024    DTaP/Tdap/Td vaccine (2 - Td or Tdap) 09/28/2027    DEXA (modify frequency per FRAX score)  Completed    Flu vaccine  Completed    Shingles vaccine  Completed    Pneumococcal 50+ years Vaccine  Completed    Respiratory Syncytial Virus (RSV) Pregnant or age 84 yrs+  Completed    Hepatitis A vaccine  Aged Out    Hepatitis B vaccine  Aged Out    Hib vaccine  Aged Out    Polio vaccine  Aged Out    Meningococcal (ACWY) vaccine  Aged Out    Meningococcal B vaccine  Aged Out       Subjective:      Review of Systems   Musculoskeletal:  Positive for back pain.   Neurological:  Positive for tremors.   Psychiatric/Behavioral:  Positive for confusion (slightly worse than her baseline).        Objective:     Vitals:    03/20/23 0924   BP: 122/80   BP Site: Right Upper Arm   Patient Position: Sitting   Pulse: 79   Resp: 20   Temp: 97.7 F (36.5 C)   TempSrc: Temporal   SpO2: 92%   Weight: 59 kg (130 lb)     Physical Exam  Constitutional:       General: She is not in acute distress.     Appearance: Normal appearance.   HENT:      Head: Normocephalic and atraumatic.   Eyes:      Conjunctiva/sclera: Conjunctivae normal.   Cardiovascular:      Rate and Rhythm: Normal rate and regular rhythm.      Heart sounds: Normal heart sounds.   Pulmonary:      Effort: Pulmonary effort is normal. No respiratory distress.      Breath sounds: Normal breath sounds.   Abdominal:      General: Bowel sounds are normal. There is no distension.      Palpations: Abdomen is soft.      Tenderness: There is  no abdominal tenderness.   Musculoskeletal:      Lumbar back: Tenderness present.      Right lower leg: No edema.      Left lower leg: No edema.   Skin:     General:  Skin is warm and dry.   Neurological:      General: No focal deficit present.      Mental Status: She is alert and oriented to person, place, and time.      Motor: Tremor (b/l hands; also head (mild)) present.   Psychiatric:         Mood and Affect: Mood normal.         Assessment:      1. Acute right-sided low back pain without sciatica  -     gabapentin (NEURONTIN) 100 MG capsule; Take 1 capsule by mouth 2 times daily for 30 days. Intended supply: 30 days, Disp-60 capsule, R-0Normal  2. History of hypertension  -     Blood Pressure Monitoring (COMFORT TOUCH BP CUFF/MEDIUM) MISC; Use daily to check blood pressure., Disp-1 each, R-0PLEASE DISPENSE MANUAL BP CUFFNormal  3. Alzheimer's disease, unspecified (CODE) (HCC)  4. Chronic cough  -     omeprazole (PRILOSEC) 40 MG delayed release capsule; Take 1 capsule by mouth daily (with breakfast), Disp-90 capsule, R-0Normal  5. Gastro-esophageal reflux disease without esophagitis  -     omeprazole (PRILOSEC) 40 MG delayed release capsule; Take 1 capsule by mouth daily (with breakfast), Disp-90 capsule, R-0Normal         Plan:      Assessment & Plan  1. Post-hospitalization follow-up.  Her memory issues could be attributed to recent anesthesia and low oxygen levels. She has been experiencing pain in the same area as before, which could be related to her recent procedure. Her blood pressure readings have been within normal limits during this visit. She has been experiencing tremors, which have worsened recently. She has been experiencing right lower quadrant pain, which could be related to her previous bowel obstruction. She has been experiencing pain in the back of her hip, which could be related to her SI joint. She has been experiencing pain in the same area as before, which could be related to her recent procedure. A prescription for a manual blood pressure cuff will be sent to [facility] [address] in [place]. She is advised to monitor her blood pressure without medication  and report the readings by the end of the week. A prescription for omeprazole 40 mg twice daily will be sent to [facility] [address] in [place]. She is advised to take omeprazole 40 mg in the morning and famotidine 40 mg at night. A prescription for gabapentin 100 mg twice daily will be sent to [facility] [address] in [place]. She is advised to continue taking gabapentin 300 mg at night. A referral for home physical therapy will be made. She is advised to continue taking gabapentin 300 mg at night. She is advised to continue taking gabapentin 300 mg at night. If her blood pressure consistently exceeds 140/90, antihypertensive therapy will be initiated. If her pain persists, she may consider taking gabapentin 100 mg during the day. If her pain persists, she may consider taking gabapentin 100 mg during the day.    2. Kyphoplasty.  She had a kyphoplasty on 07/22/2022. Postoperatively, she developed pneumonia and was admitted to the hospital on 07/24/2022. She was treated with antibiotics and discharged on 07/28/2022. She then went to rehab until 08/05/2022. She is  currently managing pain with Tylenol 650 mg every 8 hours, ibuprofen 800 mg 3 times a day, and gabapentin 300 mg at night. She is advised to continue this regimen and to alternate ice and heat as needed. A prescription for gabapentin 100 mg twice daily will be sent to [facility] [address] in [place]. She is advised to continue taking gabapentin 300 mg at night. If pain persists, she may consider taking gabapentin 100 mg during the day.    3. Blood pressure management.  Her blood pressure readings have been within normal limits during this visit (122/80). She has been using her husband's blood pressure medication (losartan) over the past week. A prescription for a manual blood pressure cuff will be sent to [facility] [address] in [place]. She is advised to monitor her blood pressure without medication and report the readings by the end of the week. If her blood pressure  consistently exceeds 140/90, antihypertensive therapy will be initiated.    4. Memory issues.  She has been experiencing memory issues, which could be attributed to recent anesthesia and low oxygen levels. She is currently back on Aricept. She is advised to continue taking Aricept as prescribed.    5. Gastroesophageal reflux disease.  She has been experiencing nighttime coughing and filling tissues with phlegm. She is currently on famotidine 40 mg at night. A prescription for omeprazole 40 mg twice daily will be sent to [facility] [address] in [place]. She is advised to take omeprazole 40 mg in the morning and famotidine 40 mg at night.    6. Tremors.  She has been experiencing tremors, which have worsened recently. Previous trials of primidone and propranolol were discontinued due to side effects. No new medications will be introduced at this time due to her current condition.    7. Right lower quadrant pain.  She has been experiencing right lower quadrant pain, which could be related to her previous bowel obstruction. She is advised to monitor her symptoms and report any changes.    8. Hip pain.  She has been experiencing pain in the back of her hip, which could be related to her SI joint. She has an appointment with Dr. Albertina Senegal on 04/14/2022 to discuss potential SI fusion surgery. She is advised to obtain a copy of her previous x-ray from [facility] for comparison.    9. Pain management.  She has been experiencing pain in the same area as before, which could be related to her recent procedure. She is currently managing pain with Tylenol 650 mg every 8 hours, ibuprofen 800 mg 3 times a day, and gabapentin 300 mg at night. She is advised to continue this regimen and to alternate ice and heat as needed. A prescription for gabapentin 100 mg twice daily will be sent to [facility] [address] in [place]. She is advised to continue taking gabapentin 300 mg at night. If pain persists, she may consider taking  gabapentin 100 mg during the day.    Follow-up  The patient is scheduled for a follow-up visit in April 2025.    PROCEDURE  The patient underwent kyphoplasty on 02/22/2023.        Return for already scheduled OV on 05/03/23.    No orders of the defined types were placed in this encounter.    Orders Placed This Encounter   Medications    Blood Pressure Monitoring (COMFORT TOUCH BP CUFF/MEDIUM) MISC     Sig: Use daily to check blood pressure.  Dispense:  1 each     Refill:  0     PLEASE DISPENSE MANUAL BP CUFF    omeprazole (PRILOSEC) 40 MG delayed release capsule     Sig: Take 1 capsule by mouth daily (with breakfast)     Dispense:  90 capsule     Refill:  0    gabapentin (NEURONTIN) 100 MG capsule     Sig: Take 1 capsule by mouth 2 times daily for 30 days. Intended supply: 30 days     Dispense:  60 capsule     Refill:  0           Discussed use, benefit, and side effects of prescribed medications.  All patient questions answered.  Pt voiced understanding.  Reviewed health maintenance.    The patient (or guardian, if applicable) and other individuals in attendance with the patient were advised that Artificial Intelligence will be utilized during this visit to record, process the conversation to generate a clinical note, and support improvement of the AI technology. The patient (or guardian, if applicable) and other individuals in attendance at the appointment consented to the use of AI, including the recording.                    Electronically signed by Belinda Fisher, DO, DO on 03/27/2023 at 8:55 AM

## 2023-03-22 ENCOUNTER — Encounter

## 2023-03-22 NOTE — Progress Notes (Signed)
 Checked with C Weasel to see if [facility] in [place] provide home health/PT/OT. They do not. Recommend [facility]. Referral placed now.

## 2023-03-24 NOTE — Telephone Encounter (Signed)
 Tammy Sours calling from [facility] physical therapy. He states they received the referral, he is wanting a verbal on going to see the pt 2x a week for 3 weeks and then 1x a week for 4 weeks. Please give him a call back at [phone].

## 2023-03-24 NOTE — Telephone Encounter (Signed)
 Is this OK? Writer will call Katie Juarez back on Monday if OK per KB.

## 2023-03-27 NOTE — Telephone Encounter (Signed)
 Katie Juarez updated at this time.

## 2023-03-27 NOTE — Telephone Encounter (Signed)
 OK for verbal order for recommended therapy frequency.

## 2023-03-28 ENCOUNTER — Encounter

## 2023-03-28 NOTE — Telephone Encounter (Signed)
 [facility] called requesting a refill of the below medication which has been pended for you:     Requested Prescriptions     Pending Prescriptions Disp Refills    citalopram (CELEXA) 20 MG tablet [Pharmacy Med Name: Citalopram Hydrobromide 20 MG Oral Tablet] 90 tablet 0     Sig: Take 1 tablet by mouth once daily       Last Appointment Date: 03/20/2023  Next Appointment Date: 05/03/2023    Allergies   Allergen Reactions    Morphine Other (See Comments)     Night sweats,felt "loopy".    Pcn [Penicillins] Hives    Zithromax [Azithromycin] Hives    Adhesive Tape Rash

## 2023-03-30 MED ORDER — CITALOPRAM HYDROBROMIDE 20 MG PO TABS
20 | ORAL_TABLET | Freq: Every day | ORAL | 1 refills | Status: DC
Start: 2023-03-30 — End: 2023-11-03

## 2023-04-04 NOTE — Telephone Encounter (Signed)
 Spoke with dtr, states her mother is not using oxygen anymore. Dtr periodically checks saturation and she is between 92-96%. Writer states pt has upcoming OV on 4/23 with KB and she would like to re-evaluate at that time before discontinuing oxygen. States she is not paying for this weekly or monthly so this will be OK.   Can d/c oxygen at next OV if OK with KB.

## 2023-04-04 NOTE — Telephone Encounter (Signed)
 Patient's daughter called to report that her O2 stats 92-96. She is wondering if we could fax an order to [facility] stating she no longer needs her oxygen. [phone]

## 2023-04-08 ENCOUNTER — Encounter

## 2023-04-10 NOTE — Telephone Encounter (Signed)
 Rx sent 03/15/23 for 90 days with 1 refill to [facility] on [address] in [place].

## 2023-04-28 ENCOUNTER — Encounter

## 2023-04-28 NOTE — Telephone Encounter (Signed)
 Katie Juarez called requesting a refill of the below medication which has been pended for you:   Per [facility], last refill for Gabapentin  03/10 #60 for 30 days  Last refill for Lorazepam  01/02 #90 for 90 days   Pt is aware pcp is out of office. Has enough to last until Monday when she returns  Requested Prescriptions     Pending Prescriptions Disp Refills    gabapentin  (NEURONTIN ) 100 MG capsule 60 capsule 0     Sig: Take 1 capsule by mouth 2 times daily for 30 days. Intended supply: 30 days    LORazepam  (ATIVAN ) 0.5 MG tablet 90 tablet 0     Sig: Take 1 tablet by mouth nightly for 90 days. Max Daily Amount: 0.5 mg       Last Appointment Date: 03/20/2023  Next Appointment Date: 05/03/2023

## 2023-05-02 MED ORDER — LORAZEPAM 0.5 MG PO TABS
0.5 | ORAL_TABLET | Freq: Every evening | ORAL | 0 refills | 15.00000 days | Status: DC
Start: 2023-05-02 — End: 2023-08-03

## 2023-05-02 NOTE — Telephone Encounter (Signed)
 Lorazepam  refilled.  Would like to wait to refill Gabapentin  after I speak with her at OV tomorrow on how this med is doing.      Controlled Substance Monitoring:    Acute and Chronic Pain Monitoring:   RX Monitoring Periodic Controlled Substance Monitoring Chronic Pain > 80 MEDD   05/02/2023   1:17 PM No signs of potential drug abuse or diversion identified. Obtained or confirmed "Medication Contract" on file.

## 2023-05-02 NOTE — Telephone Encounter (Signed)
 Noted, will pend in OV encounter.

## 2023-05-03 ENCOUNTER — Inpatient Hospital Stay: Admit: 2023-05-03 | Payer: MEDICARE | Primary: Family Medicine

## 2023-05-03 ENCOUNTER — Inpatient Hospital Stay: Payer: MEDICARE | Primary: Family Medicine

## 2023-05-03 ENCOUNTER — Ambulatory Visit: Admit: 2023-05-03 | Discharge: 2023-05-03 | Payer: MEDICARE | Attending: Family Medicine | Primary: Family Medicine

## 2023-05-03 VITALS — Temp 97.30000°F

## 2023-05-03 DIAGNOSIS — M810 Age-related osteoporosis without current pathological fracture: Secondary | ICD-10-CM

## 2023-05-03 DIAGNOSIS — I10 Essential (primary) hypertension: Secondary | ICD-10-CM

## 2023-05-03 DIAGNOSIS — M48061 Spinal stenosis, lumbar region without neurogenic claudication: Secondary | ICD-10-CM

## 2023-05-03 DIAGNOSIS — M545 Low back pain, unspecified: Secondary | ICD-10-CM

## 2023-05-03 LAB — COMPREHENSIVE METABOLIC PANEL
ALT: 9 U/L — ABNORMAL LOW (ref 10–35)
AST: 18 U/L (ref 10–35)
Albumin/Globulin Ratio: 1.6 (ref 1.0–2.5)
Albumin: 3.9 g/dL (ref 3.5–5.2)
Alkaline Phosphatase: 84 U/L (ref 35–104)
Anion Gap: 12 mmol/L (ref 9–16)
BUN/Creatinine Ratio: 25 — ABNORMAL HIGH (ref 9–20)
BUN: 20 mg/dL (ref 8–23)
CO2: 26 mmol/L (ref 20–31)
Calcium: 9 mg/dL (ref 8.6–10.4)
Chloride: 105 mmol/L (ref 98–107)
Creatinine: 0.8 mg/dL (ref 0.6–0.9)
Est, Glom Filt Rate: 73 mL/min/{1.73_m2} (ref >60)
Glucose: 113 mg/dL — ABNORMAL HIGH (ref 74–99)
Potassium: 3.7 mmol/L (ref 3.7–5.3)
Sodium: 143 mmol/L (ref 136–145)
Total Bilirubin: 0.3 mg/dL (ref 0.0–1.2)
Total Protein: 6.4 g/dL — ABNORMAL LOW (ref 6.6–8.7)

## 2023-05-03 MED ORDER — ARNUITY ELLIPTA 100 MCG/ACT IN AEPB
100 | Freq: Every day | RESPIRATORY_TRACT | 11 refills | Status: DC
Start: 2023-05-03 — End: 2023-11-03

## 2023-05-03 MED ORDER — FAMOTIDINE 40 MG PO TABS
40 | ORAL_TABLET | Freq: Two times a day (BID) | ORAL | 1 refills | 30.00000 days | Status: DC
Start: 2023-05-03 — End: 2023-10-26

## 2023-05-03 MED ORDER — HANDICAP PLACARD
0 refills | 7.00000 days | Status: AC
Start: 2023-05-03 — End: ?

## 2023-05-03 MED ORDER — DENOSUMAB 60 MG/ML SC SOSY
60 | Freq: Once | SUBCUTANEOUS | Status: AC
Start: 2023-05-03 — End: 2023-05-03
  Administered 2023-05-03: 18:00:00 60 mg via SUBCUTANEOUS

## 2023-05-03 MED ORDER — GABAPENTIN 100 MG PO CAPS
100 | ORAL_CAPSULE | ORAL | 1 refills | 30.00000 days | Status: DC
Start: 2023-05-03 — End: 2023-08-03

## 2023-05-03 MED FILL — PROLIA 60 MG/ML SC SOSY: 60 MG/ML | SUBCUTANEOUS | Qty: 1 | Fill #0

## 2023-05-03 NOTE — Progress Notes (Signed)
 [facility]  [address]  [place], [place] [address]  [phone]      Katie Juarez is a 83 y.o. female who presents today for her medical conditions/complaints as noted below.  Katie Juarez is c/o of Discuss Medications (4 mo f/u, d/c O2? Denies SOB - hasn't used since Feb) and Constipation (Increased issues with BM since procedure)      HPI:     History of Present Illness  The patient is an 83 year old female who presents today for follow-up on medications and kyphoplasty. She is accompanied by her daughter.    The chief complaint is a challenging night last night, with a recurrence of pain this morning. No recent falls or stumbles have been reported. She felt well upon returning home from her procedure on Monday and throughout the night. A follow-up appointment with Dr. Ardyth Kroner is scheduled in 2 weeks. Percocet  has been prescribed for pain management, taken at half the recommended dose. Despite this, fogginess persists, leading to the belief that even the reduced dose may be too potent. The fogginess is suspected to be a side effect of the Percocet  and anesthesia. The last dose of Percocet  was taken yesterday morning, and a single gabapentin  was taken last night. Consideration is being given to increasing the gabapentin  dosage to 2 or 3 tablets at night to aid with sleep. One gabapentin  has been taken during the day as needed and 2 at night up until Sunday. Approximately 3 gabapentin  tablets remain. Uncertainty exists regarding the need for additional medication at this time. The daytime gabapentin  is found beneficial but induces drowsiness. Improvement in back pain is hoped for.    MiraLAX  has been used daily for the past few days without improvement. Stool softeners have also been tried without success. No urinary issues, including burning or odor, are reported. No abdominal pain is reported, but stomach growling is noted.    No current issues with blood pressure are  reported. It is believed that losartan is not currently being taken.    Oxygen has not been used since 03/20/2023. Uncertainty exists regarding the need for oxygen at night. No issues with oxygen levels were reported during the procedure on Monday.    Increased thirst has been experienced for the past couple of weeks. Water  intake has been increased, with 2 large glasses of water  consumed daily.    Driving has not occurred since returning from [place] . All necessary equipment is available at home, including grab bars in the bathroom, a shower chair, walker, and cane. Home health services have been declined as floor exercises are not desired. Plans are to wait until after the follow-up with Dr. Ardyth Kroner to discuss potential physical therapy.    PAST SURGICAL HISTORY:  Kyphoplasty on 05/01/2023  Will see Dr. Lyle San for f/u in 2 weeks on 5/6    Was taking 1/2 of a Percocet  BID after surgery until yesterday AM    Was taking Gabapentin  200 mg nightly and 100 mg during the day PRN some days  Took only 100 mg last night        Past Medical History:   Diagnosis Date    Basal cell carcinoma (BCC) of face 10/2018    excised by Dr Belva Boyden     Breast cancer [facility]) 1999    in remission    Constipation 12/27/2018    COPD (chronic obstructive pulmonary disease) (HCC)     COVID-19 11/06/2020    DDD (degenerative disc  disease)     Diverticulitis 06/28/2018    Ileus (HCC) 08/07/2021    Large bowel obstruction (HCC) 02/2015    [place]  [facility]    LBP (low back pain)     Osteoporosis     per DEXA 05/26/22    Pharyngoesophageal dysphagia     SS (spinal stenosis)       Past Surgical History:   Procedure Laterality Date    APPENDECTOMY  1956    BLADDER SUSPENSION  1990    BREAST LUMPECTOMY Right 1999    CARDIAC CATHETERIZATION  12/21/2020    Dr Lorayne Rocks    CHOLECYSTECTOMY  1990    EXCISION/BIOPSY Left 11/01/2018    Dr Belva Boyden / Fonnie Iba, for [facility]    FEMORAL HERNIA REPAIR Left 02/20/2015    strangulated with small bowel resection - [place] ,   [facility], [address]    HYSTERECTOMY (CERVIX STATUS UNKNOWN)  1980    Done for endometriosis; done in [place]    LUMBAR SPINE SURGERY  01/03/2019    VERTEBRAL AUMENTATION  L1      OTHER SURGICAL HISTORY  11/14/13, 06/23/11, 06/30/11, 07/12/11 12/29/11    L4/L5 IESI    OTHER SURGICAL HISTORY  12/17/2013    L5/S1 IESI    OTHER SURGICAL HISTORY Bilateral 05/30/2014    SIJ and piriformis    OTHER SURGICAL HISTORY Bilateral 09/16/2014    L3, L4, L5 Diagnostic Medial Branch Block    OTHER SURGICAL HISTORY Bilateral 09/26/2014    L5 TFE    OTHER SURGICAL HISTORY  10/10/2014    bil L5 TFE    OTHER SURGICAL HISTORY  10/31/2014    caudal    SMALL INTESTINE SURGERY  02/20/2015    strangulated left femoral hernia    SPINE SURGERY N/A 01/03/2019    VERTEBRAL AUMENTATION  L1  (C-ARM X2, performed by Jeaneen Mike, MD at [facility] OR    TONSILLECTOMY  1946    UPPER GASTROINTESTINAL ENDOSCOPY N/A 04/24/2019    EGD BIOPSY performed by Ulice Gamer, MD at [facility]     Family History   Problem Relation Age of Onset    Other Mother         ALS - diagnosed at age 82    Stroke Father         age 4    Cancer Sister         pt thinks it was liver    Cancer Brother         leukemia    Diabetes Maternal Grandfather     Other Brother         drowned    Other Brother         died at age 98; think due to spinal meningitis    Lupus Sister     Alzheimer's Disease Sister     Dementia Sister     Mult Sclerosis Nephew     Mult Sclerosis Daughter      Social History     Tobacco Use    Smoking status: Former     Current packs/day: 0.00     Average packs/day: 1 pack/day for 40.0 years (40.0 ttl pk-yrs)     Types: Cigarettes     Start date: 01/10/1953     Quit date: 01/11/1988     Years since quitting: 35.3     Passive exposure: Past    Smokeless tobacco: Never    Tobacco comments:     quit 25  years ago 1990   Substance Use Topics    Alcohol use: No     Alcohol/week: 0.0 standard drinks of alcohol      Current Outpatient Medications    Medication Sig Dispense Refill    gabapentin  (NEURONTIN ) 100 MG capsule Take 3 capsules by mouth nightly; may also take 1 capsule during the day up to BID as needed 120 capsule 1    oxyCODONE -acetaminophen  (PERCOCET ) 5-325 MG per tablet       famotidine  (PEPCID ) 40 MG tablet Take 1 tablet by mouth 2 times daily 180 tablet 1    Handicap Placard MISC by Does not apply route Issue parking placard for person with disability; Applicant meets the qualifying disability criteria. Length of time expected to have disability__X___Lifetime. 1 each 0    fluticasone  (ARNUITY ELLIPTA ) 100 MCG/ACT AEPB Inhale 1 puff into the lungs daily 30 each 11    LORazepam  (ATIVAN ) 0.5 MG tablet Take 1 tablet by mouth nightly for 90 days. Max Daily Amount: 0.5 mg 90 tablet 0    citalopram  (CELEXA ) 20 MG tablet Take 1 tablet by mouth daily 90 tablet 1    ondansetron  (ZOFRAN ) 4 MG tablet Take 1 tablet by mouth every 8 hours as needed      ibuprofen  (ADVIL ;MOTRIN ) 800 MG tablet 1 tablet      Blood Pressure Monitoring (COMFORT TOUCH BP CUFF/MEDIUM) MISC Use daily to check blood pressure. 1 each 0    omeprazole  (PRILOSEC) 40 MG delayed release capsule Take 1 capsule by mouth daily (with breakfast) 90 capsule 0    donepezil  (ARICEPT ) 10 MG tablet Take 1 tablet by mouth nightly 90 tablet 1    atorvastatin  (LIPITOR ) 20 MG tablet Take 1 tablet by mouth daily 90 tablet 3    docusate sodium  (STOOL SOFTENER) 100 MG capsule Take 1 capsule by mouth as needed      INCRUSE ELLIPTA 62.5 MCG/ACT inhaler Inhale 1 puff into the lungs daily      b complex vitamins capsule Take 1 capsule by mouth daily      EQ 8HR ARTHRITIS PAIN RELIEF 650 MG extended release tablet TAKE 2 TABLETS BY MOUTH EVERY 8 HOURS AS NEEDED FOR PAIN FOR FEVER      latanoprost  (XALATAN ) 0.005 % ophthalmic solution       nitroGLYCERIN  (NITROSTAT ) 0.4 MG SL tablet Place 1 tablet under the tongue every 5 minutes as needed for Chest pain 25 tablet 3    polyethylene glycol (GLYCOLAX ) 17 GM/SCOOP  powder Take 17 g by mouth daily As needed.      CRANBERRY PO Take 1 capsule by mouth as needed      Cholecalciferol (VITAMIN D3) 50 MCG (2000 UT) CAPS Take 1 capsule by mouth daily      CALCIUM  CARBONATE-VITAMIN D PO Take 600 mg by mouth 2 times daily Contains 600 mg Calcium  & 800 IU Vitamin D3.      Multiple Vitamins-Minerals (THERAPEUTIC MULTIVITAMIN-MINERALS) tablet Take 1 tablet by mouth daily      polyvinyl alcohol-povidone (HYPOTEARS) 1.4-0.6 % ophthalmic solution Place 1-2 drops into both eyes as needed       No current facility-administered medications for this visit.     Allergies   Allergen Reactions    Morphine Other (See Comments)     Night sweats,felt "loopy".    Pcn [Penicillins] Hives    Zithromax [Azithromycin] Hives    Adhesive Tape Rash       Health Maintenance   Topic Date Due  COVID-19 Vaccine (9 - 2024-25 season) 04/12/2023    Lipids  12/26/2023    Annual Wellness Visit (Medicare)  12/28/2023    Depression Screen  03/19/2024    DTaP/Tdap/Td vaccine (2 - Td or Tdap) 09/28/2027    DEXA (modify frequency per FRAX score)  Completed    Flu vaccine  Completed    Shingles vaccine  Completed    Pneumococcal 50+ years Vaccine  Completed    Respiratory Syncytial Virus (RSV) Pregnant or age 25 yrs+  Completed    Hepatitis A vaccine  Aged Out    Hepatitis B vaccine  Aged Out    Hib vaccine  Aged Out    Polio vaccine  Aged Out    Meningococcal (ACWY) vaccine  Aged Out    Meningococcal B vaccine  Aged Out       Subjective:      Review of Systems   Genitourinary:  Negative for dysuria.       Objective:     Vitals:    05/03/23 1230   BP: 124/70   BP Site: Right Upper Arm   Patient Position: Sitting   BP Cuff Size: Medium Adult   Pulse: 59   Resp: 16   SpO2: 92%   Weight: 58.4 kg (128 lb 11.2 oz)   Height: 1.626 m (5\' 4" )     Physical Exam  Constitutional:       General: She is not in acute distress.     Appearance: Normal appearance.   HENT:      Head: Normocephalic and atraumatic.   Eyes:       Conjunctiva/sclera: Conjunctivae normal.   Cardiovascular:      Rate and Rhythm: Normal rate and regular rhythm.      Heart sounds: Normal heart sounds.   Pulmonary:      Effort: Pulmonary effort is normal. No respiratory distress.      Breath sounds: Normal breath sounds.   Abdominal:      General: Bowel sounds are normal. There is no distension.      Palpations: Abdomen is soft.      Tenderness: There is no abdominal tenderness.   Skin:     General: Skin is warm and dry.   Neurological:      General: No focal deficit present.      Mental Status: She is alert and oriented to person, place, and time.   Psychiatric:         Mood and Affect: Mood normal.         Assessment:      1. Neural foraminal stenosis of lumbar spine  -     Handicap Placard MISC; Starting Wed 05/03/2023, Disp-1 each, R-0, PrintIssue parking placard for person with disability; Applicant meets the qualifying disability criteria. Length of time expected to have disability__X___Lifetime.  2. Acute right-sided low back pain without sciatica  -     gabapentin  (NEURONTIN ) 100 MG capsule; Take 3 capsules by mouth nightly; may also take 1 capsule during the day up to BID as needed, Disp-120 capsule, R-1Normal  3. Increased thirst  4. Gastro-esophageal reflux disease without esophagitis  -     famotidine  (PEPCID ) 40 MG tablet; Take 1 tablet by mouth 2 times daily, Disp-180 tablet, R-1Normal  5. Chronic obstructive pulmonary disease, unspecified COPD type (HCC)  -     fluticasone  (ARNUITY ELLIPTA ) 100 MCG/ACT AEPB; Inhale 1 puff into the lungs daily, Disp-30 each, R-11Normal  6. Impaired fasting glucose  -  Hemoglobin A1C; Future  7. Essential hypertension  -     Comprehensive Metabolic Panel; Future         Plan:      Assessment & Plan  1. Post-kyphoplasty follow-up.  - She experienced significant pain relief initially, likely due to intra-procedural analgesics, but reported pain recurrence the following day.  - The kyphoplasty procedure was successful,  but complete resolution of back pain cannot be guaranteed due to underlying arthritis and spinal narrowing.  - Gabapentin  will be refilled with instructions to take 300 mg nightly and an additional 300 mg during the day as needed, not exceeding 3 times daily.  - A prescription for gabapentin  300 mg, 120 capsules will be sent to [facility]. If pain persists, she may increase the nighttime dose to 900 mg temporarily.    2. Constipation.  - She reported constipation, likely due to recent surgery and Percocet  use.  - Physical exam findings indicate no abdominal pain or swelling.  - She is advised to increase MiraLAX  to 2 scoops daily and stool softeners to 2 daily.  - If symptoms persist, she may use Dulcolax or Ex-Lax once or twice.    3. Hypertension.  - Her blood pressure readings have been within normal range without medication.  - Today's reading was 124/70.  - She will continue monitoring her blood pressure at home and report any significant changes.  - No current need for antihypertensive medication.    4. Increased thirst.  - She reported increased thirst over the past couple of weeks.  - This could be a side effect of her medications, including gabapentin  and lorazepam .  - An A1c test will be added to her lab work to rule out diabetes.  - She is advised to increase water  intake and consider using hard candies or over-the-counter products like Biotene if dryness persists.    5. Oxygen therapy discontinuation.  - Her oxygen saturation levels have been consistently above 89 percent, indicating that continuous oxygen therapy may not be necessary.  - No issues with oxygen levels during recent anesthesia recovery.  - A nurse will accompany her to the hospital to monitor her oxygen levels during ambulation. If her oxygen levels remain above 89 percent during ambulation, an order to discontinue oxygen therapy will be issued.  - The oxygen provider's details will be obtained for discontinuation order.    6. Medication  management.  - A prescription for Arnuity will be sent to [facility].  - Pepcid  40 mg twice daily will be refilled as it is effective for her acid reflux.    7. Health maintenance.  - Lab work will be conducted today to monitor liver and kidney function due to concerns about long-term Tylenol  use.  - A handicap placard will be provided today.    Follow-up  - The patient will follow up in 2 months.        Return in about 2 months (around 07/03/2023) for f/u meds, back pain.    Orders Placed This Encounter   Procedures    Hemoglobin A1C     Standing Status:   Future     Number of Occurrences:   1     Expected Date:   05/03/2023     Expiration Date:   05/02/2024    Comprehensive Metabolic Panel     Standing Status:   Future     Number of Occurrences:   1     Expected Date:   05/03/2023  Expiration Date:   05/02/2024       Orders Placed This Encounter   Medications    gabapentin  (NEURONTIN ) 100 MG capsule     Sig: Take 3 capsules by mouth nightly; may also take 1 capsule during the day up to BID as needed     Dispense:  120 capsule     Refill:  1    famotidine  (PEPCID ) 40 MG tablet     Sig: Take 1 tablet by mouth 2 times daily     Dispense:  180 tablet     Refill:  1    Handicap Placard MISC     Sig: by Does not apply route Issue parking placard for person with disability; Applicant meets the qualifying disability criteria. Length of time expected to have disability__X___Lifetime.     Dispense:  1 each     Refill:  0    fluticasone  (ARNUITY ELLIPTA ) 100 MCG/ACT AEPB     Sig: Inhale 1 puff into the lungs daily     Dispense:  30 each     Refill:  11           Discussed use, benefit, and side effects of prescribed medications.  All patient questions answered.  Pt voiced understanding.  Reviewed health maintenance.    The patient (or guardian, if applicable) and other individuals in attendance with the patient were advised that Artificial Intelligence will be utilized during this visit to record, process the conversation to  generate a clinical note, and support improvement of the AI technology. The patient (or guardian, if applicable) and other individuals in attendance at the appointment consented to the use of AI, including the recording.                    Electronically signed by Rennie Casco, DO, DO on 05/15/2023 at 11:59 PM

## 2023-05-03 NOTE — Progress Notes (Signed)
 Prolia injection given and no sign of reaction at this time.  Pt ambulated out infusion center without difficulty in stable condition.  Paperwork given regarding date and time of next treatment.

## 2023-05-03 NOTE — Telephone Encounter (Signed)
 Performed walk test with pt. SpO2 dropped to 88 while walking. Pt denies SOB & difficulty breathing. Please advise.

## 2023-05-04 LAB — HEMOGLOBIN A1C
Estimated Avg Glucose: 126 mg/dL
Hemoglobin A1C: 6 % (ref 4.0–6.0)

## 2023-05-05 ENCOUNTER — Ambulatory Visit: Payer: MEDICARE | Primary: Family Medicine

## 2023-05-05 NOTE — Telephone Encounter (Signed)
 Please reply to pt's daughter's MyChart message (in [facility] chart) that we can unfortunately not discontinue her oxygen at this time, as she became hypoxic while ambulating.  I recommend she continue using oxygen when active, as she should not discontinue medically at this time.  Recommend they move her oxygen supplies to her home from her daughter's home.

## 2023-05-05 NOTE — Telephone Encounter (Signed)
 Dtr updated in her chart.

## 2023-05-08 ENCOUNTER — Telehealth: Payer: MEDICARE

## 2023-05-08 NOTE — Telephone Encounter (Signed)
 Home health plan of care reviewed and certification completed on patient for service dates 03/24/23 to 05/22/23. Verified current medications, allergies, diagnoses. Physician time spent on activities to coordinate service, documenting, medical decision making, review of reports/treatment plans/ test results is 15 min.

## 2023-05-16 NOTE — Telephone Encounter (Signed)
 Pt calling back stating she's back home at her own home and her oxygen is at her daughter's and her daughter doesn't want the tank at her house, pt wants it removed and does not want to be on oxygen.   Pt states her pulse ox walking around at her home today is 85%.   Pt got the oxygen through [employer], phone [phone], please advise.

## 2023-05-16 NOTE — Telephone Encounter (Signed)
 Pt calling stating she does not want the big oxygen machine that her daughter gave her. She feels like she does not need the tank at all. She checks her 02 and it reads 83-84. Advised her that is a low reading and she should consider using the oxygen, and her 02 readings should be no lower than 90. She is wondering if she would be able to get a portable machine or a smaller concentrator. Please advise.

## 2023-05-16 NOTE — Telephone Encounter (Signed)
 Spoke with company at number pt provided, states they do not have record of pt. Call to [employer] as dtr stated this was the company. They also do not have record of pt.     Call to dtr Amy, states to try another number: [phone]  Also reports pt is to wear 2L cont.

## 2023-05-16 NOTE — Telephone Encounter (Signed)
 Writer called second number. They were able to locate pt in system but are unsure if they can send documentation. They tried to transfer to a closer location as they are in [place] but stated they would not take the call when trying to [employer]. Writer gave fax # and they also stated pt can call to request records sent to PCP office.     Writer called [employer] to see if there was another way to obtain information. Spoke with representative who states pt needs to come in to sign transfer paperwork and they will obtain all needed information from current supplier. If they need anything further they will contact us . Writer updated both daughters at this time who voiced understanding.

## 2023-05-16 NOTE — Telephone Encounter (Signed)
 Daughter calling back, current company does not service pt home address. They would like to switch companies to [employer] [facility], for oxygen concentrator and portable tanks, sent to pt address. After pt has received new equipment, [employer] (current company) can pick up old equipment with d/c order from Dr. Garen Juneau.     Will obtain order form from [facility], fill out and fax once KB has signed.

## 2023-05-16 NOTE — Telephone Encounter (Signed)
 Writer spoke with pt. States she is back at her own home and has been for a month. Her oxygen is 85% while ambulating. She does not want the concentrator at her house because it is too big and it does not fit in her room. She would like only the portable tanks.    Writer discussed in depth the importance of the oxygen concentrator vs. Portable tanks and educated on both. Pt states her oxygen is low because she is inside, if she was outside it would be different. Writer instructed pt to use oxygen if her saturation is 85%. Pt states she feels fine and doesn't need it. Writer again educated on importance of wearing oxygen if her saturation is low, explained risks such as confusion, SOB, loss of consciousness, etc. Pt states she has a life alert necklace that she can use if a problem arises, Clinical research associate stated if pt is in altered state of mind she may be unable to utilize this.     Writer spoke with daughter Amy. Amy states pt does not want the concentrator and does not think her mother's oxygen is in the 80s. She thinks she is misreading the pulse ox. She asked her at a previous time and pt stated 83-84 and then asked again a few minutes later and pt stated it was 92 and HR was in the 60s. States pt is not in any distress when she has observed her. Writer advised Dr. Garen Juneau will not be discontinuing the oxygen or the concentrator and this needs to be taken to pts home and worn when oxygen is noted to be low, pt SOB or dyspneic. Agreed that pt should keep concentrator due to walk test done here at the clinic, wondering if a smaller unit can be obtained. Writer stated they can call the company to see if they can send a smaller unit, our office does not specify the size of concentrators that are sent. Advised we can re-evaluate the need for oxygen at next OV. Voiced understanding.

## 2023-05-19 ENCOUNTER — Encounter

## 2023-05-19 NOTE — Telephone Encounter (Signed)
 Please see completed POC form on desk and encounter from 4/28.

## 2023-05-19 NOTE — Telephone Encounter (Signed)
 [facility] calling regarding plan of care. They are needing the plan of care signed and faxed back to them ASAP

## 2023-05-26 NOTE — Telephone Encounter (Signed)
 Home health certification reviewed and signed.

## 2023-05-26 NOTE — Telephone Encounter (Signed)
 Form signed by KB. Faxed now.

## 2023-05-26 NOTE — Telephone Encounter (Signed)
 [facility] calling to check the status of the signed Plan of Care for date 03/24/23. Requesting this be faxed back as soon as possible

## 2023-06-19 ENCOUNTER — Encounter

## 2023-06-19 NOTE — Telephone Encounter (Signed)
 Per [place], last fill GABAPENTIN  4/23, quantity 120 for 24 days.   Per [place], last fill LORAZEPAM  4/22, quantity 90 for 90 days.     TOO SOON TO FILL FOR LORAZEPAM .    Donazepil 3/5 for 90 days to [facility] in [place].      [name] called requesting a refill of the below medication which has been pended for you:     Requested Prescriptions     Pending Prescriptions Disp Refills    gabapentin  (NEURONTIN ) 100 MG capsule 120 capsule 1     Sig: Take 3 capsules by mouth nightly; may also take 1 capsule during the day up to BID as needed    donepezil  (ARICEPT ) 10 MG tablet 90 tablet 1     Sig: Take 1 tablet by mouth nightly       Last Appointment Date: 05/03/2023  Next Appointment Date: 07/03/2023    Allergies   Allergen Reactions    Morphine Other (See Comments)     Night sweats,felt "loopy".    Pcn [Penicillins] Hives    Zithromax [Azithromycin] Hives    Adhesive Tape Rash

## 2023-06-20 NOTE — Telephone Encounter (Signed)
 Donepezil  refilled.  A refill was sent with the 4/23 Rx of Gabapentin  that I do not show as having been filled yet per [place] - is this still on file at [facility] to fill now?

## 2023-06-21 MED ORDER — DONEPEZIL HCL 10 MG PO TABS
10 | ORAL_TABLET | Freq: Every evening | ORAL | 1 refills | 90.00000 days | Status: DC
Start: 2023-06-21 — End: 2023-11-03

## 2023-06-21 NOTE — Telephone Encounter (Signed)
 [facility] is refilling they do have a refill of this medication

## 2023-06-28 ENCOUNTER — Encounter

## 2023-06-28 NOTE — Telephone Encounter (Signed)
 Previously prescribed by [facility] and Dr. Alvah Auerbach. Are you taking over? Is pt still seeing Lughmani?      Sandi called requesting a refill of the below medication which has been pended for you:     Requested Prescriptions     Pending Prescriptions Disp Refills    INCRUSE ELLIPTA 62.5 MCG/ACT inhaler       Sig: Inhale 1 puff into the lungs daily       Last Appointment Date: 05/03/2023  Next Appointment Date: 07/03/2023    Allergies   Allergen Reactions    Morphine Other (See Comments)     Night sweats,felt loopy.    Pcn [Penicillins] Hives    Zithromax [Azithromycin] Hives    Adhesive Tape Rash

## 2023-06-28 NOTE — Telephone Encounter (Signed)
 Fax received, fax attached

## 2023-07-02 NOTE — Telephone Encounter (Signed)
 I am unsure if pt is still seeing Dr. Liz - please check with pt and/or family.

## 2023-07-03 ENCOUNTER — Ambulatory Visit: Admit: 2023-07-03 | Discharge: 2023-07-03 | Payer: MEDICARE | Attending: Family Medicine | Primary: Family Medicine

## 2023-07-03 VITALS — BP 110/70 | HR 60 | Ht 64.0 in | Wt 120.0 lb

## 2023-07-03 DIAGNOSIS — R233 Spontaneous ecchymoses: Principal | ICD-10-CM

## 2023-07-03 MED ORDER — INCRUSE ELLIPTA 62.5 MCG/ACT IN AEPB
62.5 | Freq: Every day | RESPIRATORY_TRACT | 3 refills | 30.00000 days | Status: DC
Start: 2023-07-03 — End: 2023-11-03

## 2023-07-03 NOTE — Progress Notes (Signed)
 [facility]  [address]  [place], [place] [address]  [phone]      Katie Juarez is a 83 y.o. female who presents today for her medical conditions/complaints as noted below.  Katie Juarez is c/o of Anxiety (Follow up appt )      HPI:     History of Present Illness  The patient is an 83 year old female who presents today for a follow-up on anxiety and depression.    She reports no significant change in her condition since the last visit. She has been under the care of Dr. Sherrill, who recently refilled her inhaler prescription. A chest x-ray was performed at [facility], and a pulmonary function test (PFT) was conducted on 06/28/2023. The PFT results indicated moderate lung restriction, with no response to medication. It was suggested that the test results might be inaccurate due to poor sealing around the mouthpiece. A repeat PFT was recommended for future comparison. She was advised to continue using Incruse and Arnuity inhalers, with one puff each in the morning and evening. She has not required oxygen therapy and has not experienced shortness of breath. She has been experiencing a distended diaphragm. She has been experiencing less mucus production since getting her glasses fixed.    She has been experiencing bruising and broken blood vessels, which are attributed to her thin skin and blood vessels. She has been taking Tylenol  500 mg twice a day and ibuprofen  4 times a day, staggered throughout the day, for pain management. She has been off baby aspirin  since 12/2022. She has been seeing Dr. Shirley and received an injection in her L3, which provided some relief for about a day or two. He is going to try the L2 now, and if she gets any relief, he will put clips in her L2 and L3. This is the best day she has had in a while. Normally, she needs a cane to walk. She has been taking gabapentin  300 mg 3 times at night. She did try to take it during the day, but it made  her feel very tired.    She has been taking one stool softener at night before bed and has not needed to use MiraLAX  during the day. She reports that her constipation has improved.    She has been drinking enough water  and has been craving ice water . She reports feeling thirsty and drinks 3 to 4 big glasses of water  a day.    She does not believe she has been getting enough protein in her diet. She likes chicken but does not eat beef.      Past Medical History:   Diagnosis Date    Basal cell carcinoma (BCC) of face 10/2018    excised by Dr Bethel     Breast cancer [facility]) 1999    in remission    Constipation 12/27/2018    COPD (chronic obstructive pulmonary disease) (HCC)     COVID-19 11/06/2020    DDD (degenerative disc disease)     Diverticulitis 06/28/2018    Ileus (HCC) 08/07/2021    Large bowel obstruction (HCC) 02/2015    [place]  [place]    LBP (low back pain)     Osteoporosis     per DEXA 05/26/22    Pharyngoesophageal dysphagia     SS (spinal stenosis)       Past Surgical History:   Procedure Laterality Date    APPENDECTOMY  1956    BLADDER SUSPENSION  1990    BREAST LUMPECTOMY Right 1999    CARDIAC CATHETERIZATION  12/21/2020    Dr Glady    CHOLECYSTECTOMY  1990    EXCISION/BIOPSY Left 11/01/2018    Dr Bethel / [facility], for [facility]    FEMORAL HERNIA REPAIR Left 02/20/2015    strangulated with small bowel resection - [place] ,  [facility], [address]    HYSTERECTOMY (CERVIX STATUS UNKNOWN)  1980    Done for endometriosis; done in [place]    LUMBAR SPINE SURGERY  01/03/2019    VERTEBRAL AUMENTATION  L1      OTHER SURGICAL HISTORY  11/14/13, 06/23/11, 06/30/11, 07/12/11 12/29/11    L4/L5 IESI    OTHER SURGICAL HISTORY  12/17/2013    L5/S1 IESI    OTHER SURGICAL HISTORY Bilateral 05/30/2014    SIJ and piriformis    OTHER SURGICAL HISTORY Bilateral 09/16/2014    L3, L4, L5 Diagnostic Medial Branch Block    OTHER SURGICAL HISTORY Bilateral 09/26/2014    L5 TFE    OTHER SURGICAL HISTORY  10/10/2014    bil L5 TFE     OTHER SURGICAL HISTORY  10/31/2014    caudal    SMALL INTESTINE SURGERY  02/20/2015    strangulated left femoral hernia    SPINE SURGERY N/A 01/03/2019    VERTEBRAL AUMENTATION  L1  (C-ARM X2, performed by Debby KANDICE Mitts, MD at [facility] OR    TONSILLECTOMY  1946    UPPER GASTROINTESTINAL ENDOSCOPY N/A 04/24/2019    EGD BIOPSY performed by Reyes DELENA Griffin, MD at [facility] OR     Family History   Problem Relation Age of Onset    Other Mother         ALS - diagnosed at age 69    Stroke Father         age 60    Cancer Sister         pt thinks it was liver    Cancer Brother         leukemia    Diabetes Maternal Grandfather     Other Brother         drowned    Other Brother         died at age 36; think due to spinal meningitis    Lupus Sister     Alzheimer's Disease Sister     Dementia Sister     Mult Sclerosis Nephew     Mult Sclerosis Daughter      Social History     Tobacco Use    Smoking status: Former     Current packs/day: 0.00     Average packs/day: 1 pack/day for 40.0 years (40.0 ttl pk-yrs)     Types: Cigarettes     Start date: 01/10/1953     Quit date: 01/11/1988     Years since quitting: 35.5     Passive exposure: Past    Smokeless tobacco: Never    Tobacco comments:     quit 25 years ago 1990   Substance Use Topics    Alcohol use: No     Alcohol/week: 0.0 standard drinks of alcohol      Current Outpatient Medications   Medication Sig Dispense Refill    donepezil  (ARICEPT ) 10 MG tablet Take 1 tablet by mouth nightly 90 tablet 1    famotidine  (PEPCID ) 40 MG tablet Take 1 tablet by mouth 2 times daily 180 tablet 1    Handicap Placard MISC by Does not  apply route Issue parking placard for person with disability; Applicant meets the qualifying disability criteria. Length of time expected to have disability__X___Lifetime. 1 each 0    fluticasone  (ARNUITY ELLIPTA ) 100 MCG/ACT AEPB Inhale 1 puff into the lungs daily 30 each 11    LORazepam  (ATIVAN ) 0.5 MG tablet Take 1 tablet by mouth nightly for 90 days. Max Daily Amount:  0.5 mg 90 tablet 0    citalopram  (CELEXA ) 20 MG tablet Take 1 tablet by mouth daily 90 tablet 1    ondansetron  (ZOFRAN ) 4 MG tablet Take 1 tablet by mouth every 8 hours as needed      ibuprofen  (ADVIL ;MOTRIN ) 800 MG tablet 1 tablet      Blood Pressure Monitoring (COMFORT TOUCH BP CUFF/MEDIUM) MISC Use daily to check blood pressure. 1 each 0    omeprazole  (PRILOSEC) 40 MG delayed release capsule Take 1 capsule by mouth daily (with breakfast) 90 capsule 0    atorvastatin  (LIPITOR ) 20 MG tablet Take 1 tablet by mouth daily 90 tablet 3    docusate sodium  (STOOL SOFTENER) 100 MG capsule Take 1 capsule by mouth as needed      b complex vitamins capsule Take 1 capsule by mouth daily      EQ 8HR ARTHRITIS PAIN RELIEF 650 MG extended release tablet TAKE 2 TABLETS BY MOUTH EVERY 8 HOURS AS NEEDED FOR PAIN FOR FEVER      latanoprost  (XALATAN ) 0.005 % ophthalmic solution       nitroGLYCERIN  (NITROSTAT ) 0.4 MG SL tablet Place 1 tablet under the tongue every 5 minutes as needed for Chest pain 25 tablet 3    polyethylene glycol (GLYCOLAX ) 17 GM/SCOOP powder Take 17 g by mouth daily As needed.      CRANBERRY PO Take 1 capsule by mouth as needed      Cholecalciferol (VITAMIN D3) 50 MCG (2000 UT) CAPS Take 1 capsule by mouth daily      CALCIUM  CARBONATE-VITAMIN D PO Take 600 mg by mouth 2 times daily Contains 600 mg Calcium  & 800 IU Vitamin D3.      Multiple Vitamins-Minerals (THERAPEUTIC MULTIVITAMIN-MINERALS) tablet Take 1 tablet by mouth daily      polyvinyl alcohol-povidone (HYPOTEARS) 1.4-0.6 % ophthalmic solution Place 1-2 drops into both eyes as needed      INCRUSE ELLIPTA  62.5 MCG/ACT inhaler Inhale 1 puff into the lungs daily 90 each 3    gabapentin  (NEURONTIN ) 100 MG capsule Take 3 capsules by mouth nightly; may also take 1 capsule during the day up to BID as needed 120 capsule 1     No current facility-administered medications for this visit.     Allergies   Allergen Reactions    Morphine Other (See Comments)     Night  sweats,felt loopy.    Pcn [Penicillins] Hives    Zithromax [Azithromycin] Hives    Adhesive Tape Rash       Health Maintenance   Topic Date Due    COVID-19 Vaccine (9 - 2024-25 season) 04/12/2023    Lipids  12/26/2023    Annual Wellness Visit (Medicare)  12/28/2023    Depression Screen  03/19/2024    DTaP/Tdap/Td vaccine (2 - Td or Tdap) 09/28/2027    DEXA (modify frequency per FRAX score)  Completed    Flu vaccine  Completed    Shingles vaccine  Completed    Pneumococcal 50+ years Vaccine  Completed    Respiratory Syncytial Virus (RSV) Pregnant or age 78 yrs+  Completed  Hepatitis A vaccine  Aged Out    Hepatitis B vaccine  Aged Out    Hib vaccine  Aged Out    Polio vaccine  Aged Out    Meningococcal (ACWY) vaccine  Aged Out    Meningococcal B vaccine  Aged Out       Subjective:      Review of Systems   Hematological:  Bruises/bleeds easily.   Psychiatric/Behavioral:  The patient is nervous/anxious (stable).        Objective:     Vitals:    07/03/23 1125   BP: 110/70   BP Site: Right Upper Arm   Patient Position: Sitting   BP Cuff Size: Medium Adult   Pulse: 60   SpO2: 95%   Weight: 54.4 kg (120 lb)   Height: 1.626 m (5' 4)     Physical Exam  Vitals and nursing note reviewed.   Constitutional:       General: She is not in acute distress.     Appearance: Normal appearance.   HENT:      Head: Normocephalic and atraumatic.   Eyes:      Conjunctiva/sclera: Conjunctivae normal.   Cardiovascular:      Rate and Rhythm: Normal rate and regular rhythm.      Heart sounds: Normal heart sounds.   Pulmonary:      Effort: Pulmonary effort is normal. No respiratory distress.      Breath sounds: Normal breath sounds.   Abdominal:      General: Bowel sounds are normal. There is no distension.      Palpations: Abdomen is soft.      Tenderness: There is no abdominal tenderness.   Musculoskeletal:      Right lower leg: No edema.      Left lower leg: No edema.   Skin:     General: Skin is warm and dry.      Findings: Bruising  present.   Neurological:      General: No focal deficit present.      Mental Status: She is alert and oriented to person, place, and time.   Psychiatric:         Mood and Affect: Mood normal.         Assessment:      1. Easy bruising  -     CBC; Future  2. Acute right-sided low back pain without sciatica  3. Restrictive lung disease  4. Chronic obstructive pulmonary disease, unspecified COPD type (HCC)         Plan:      Assessment & Plan  1. Anxiety and depression.  - Reports feeling about the same.  - Current medications, including lorazepam , are effective and will continue until 07/31/2023.  - No changes in medication regimen discussed.    2. Chronic obstructive pulmonary disease (COPD).  - Continues using Incruse and Arnuity inhalers, one puff each daily.  - Incruse inhaler will be refilled and sent to pharmacy.  - Repeat PFT recommended in 3 months to confirm previous results.  - Further workup, including a sniff test, will be considered if an elevated left diaphragm is seen on the x-ray.    3. Bruising.  - Bruising likely due to thin skin and blood vessels, exacerbated by ibuprofen  use.  - Advised to reduce ibuprofen  intake from 4 to 3 tablets daily initially, then gradually decrease to 2 tablets daily.  - Tylenol  1000 mg can be taken up to 3 times daily, ensuring a  minimum of 6 hours between doses.  - Platelet count will be included in upcoming lab work in early 10/2023.    4. Pain management.  - Currently taking gabapentin  300 mg 3 times at night.  - Refill for gabapentin  will be provided and sent to pharmacy.  - Advised to continue current regimen and only take an additional dose during the day if absolutely necessary.    5. Constipation.  - Reports improvement in constipation symptoms.  - Will continue taking one stool softener at night.    6. Thirst.  - A1C is 6.0.  - Advised to maintain adequate hydration.    7. Low protein.  - Protein level is 6.4.  - Advised to increase protein intake in  diet.    Follow-up  - Follow up in 4 months.      Return in about 4 months (around 11/07/2023) for f/u meds, breathing, anxiety.    Orders Placed This Encounter   Procedures    CBC     Standing Status:   Future     Expected Date:   07/30/2023     Expiration Date:   07/02/2024     No orders of the defined types were placed in this encounter.          Discussed use, benefit, and side effects of prescribed medications.  All patient questions answered.  Pt voiced understanding.  Reviewed health maintenance.    The patient (or guardian, if applicable) and other individuals in attendance with the patient were advised that Artificial Intelligence will be utilized during this visit to record, process the conversation to generate a clinical note, and support improvement of the AI technology. The patient (or guardian, if applicable) and other individuals in attendance at the appointment consented to the use of AI, including the recording.                  Electronically signed by Wonda MARLA Ka, DO, DO on 07/10/2023 at 11:54 PM

## 2023-07-03 NOTE — Telephone Encounter (Signed)
 Spoke with pt and daughter at OV today - they would like to stop seeing Dr. Liz for the time being, so I will refill Incruse Rx now.

## 2023-07-31 ENCOUNTER — Encounter

## 2023-08-01 NOTE — Telephone Encounter (Signed)
 Meds [place]   Gabapentin  last filled 06/21/23 for #120/24 days  Lorazepam  last filled 05/02/23 for #90/90 days    Last OV: 07/03/2023   Next OV: 11/03/2023

## 2023-08-03 MED ORDER — LORAZEPAM 0.5 MG PO TABS
0.5 | ORAL_TABLET | Freq: Every evening | ORAL | 0 refills | 30.00000 days | Status: DC | PRN
Start: 2023-08-03 — End: 2023-11-03

## 2023-08-03 MED ORDER — GABAPENTIN 100 MG PO CAPS
100 | ORAL_CAPSULE | Freq: Every evening | ORAL | 0 refills | 30.00000 days | Status: DC
Start: 2023-08-03 — End: 2023-11-03

## 2023-08-03 NOTE — Telephone Encounter (Signed)
 Controlled Substance Monitoring:    Acute and Chronic Pain Monitoring:   RX Monitoring Periodic Controlled Substance Monitoring Chronic Pain > 80 MEDD   08/03/2023   3:45 PM No signs of potential drug abuse or diversion identified. Obtained or confirmed Medication Contract on file.

## 2023-08-15 ENCOUNTER — Encounter

## 2023-08-15 NOTE — Telephone Encounter (Signed)
 Pt daughter calling back stating they are wanting to stick with [facility] in [place]. She said there is an inhaler they are wanting to try with [facility], but not this medication.

## 2023-08-15 NOTE — Telephone Encounter (Signed)
 [facility], this was last sent to [facility] for 90 days with 1 refill, shouldn't need refill until September. Request is for [facility], is pt switching to this pharmacy?

## 2023-10-11 ENCOUNTER — Inpatient Hospital Stay: Admit: 2023-10-11 | Payer: MEDICARE | Primary: Family Medicine

## 2023-10-11 VITALS — BP 109/64 | HR 68 | Temp 98.40000°F | Resp 16 | Ht 64.0 in | Wt 125.0 lb

## 2023-10-11 DIAGNOSIS — Z01818 Encounter for other preprocedural examination: Principal | ICD-10-CM

## 2023-10-11 LAB — BASIC METABOLIC PANEL
Anion Gap: 12 mmol/L (ref 9–16)
BUN: 25 mg/dL — ABNORMAL HIGH (ref 8–23)
CO2: 26 mmol/L (ref 20–31)
Calcium: 9.3 mg/dL (ref 8.6–10.4)
Chloride: 104 mmol/L (ref 98–107)
Creatinine: 1 mg/dL — ABNORMAL HIGH (ref 0.6–0.9)
Est, Glom Filt Rate: 56 mL/min/1.73m2 — ABNORMAL LOW (ref >60)
Glucose: 104 mg/dL — ABNORMAL HIGH (ref 74–99)
Potassium: 3.8 mmol/L (ref 3.7–5.3)
Sodium: 142 mmol/L (ref 136–145)

## 2023-10-11 LAB — EKG 12-LEAD
Atrial Rate: 69 {beats}/min
P Axis: 60 degrees
P-R Interval: 170 ms
Q-T Interval: 308 ms
QRS Duration: 64 ms
QTc Calculation (Bazett): 330 ms
R Axis: 38 degrees
T Axis: -19 degrees
Ventricular Rate: 69 {beats}/min

## 2023-10-11 LAB — CBC WITH AUTO DIFFERENTIAL
Basophils %: 2 % (ref 0–2)
Basophils Absolute: 0.16 k/uL (ref 0.00–0.20)
Eosinophils %: 2 % (ref 1–4)
Eosinophils Absolute: 0.21 k/uL (ref 0.00–0.44)
Hematocrit: 42 % (ref 36.3–47.1)
Hemoglobin: 13.1 g/dL (ref 11.9–15.1)
Immature Granulocytes %: 4 % — ABNORMAL HIGH
Immature Granulocytes Absolute: 0.42 k/uL — ABNORMAL HIGH (ref 0.00–0.30)
Lymphocytes %: 14 % — ABNORMAL LOW (ref 24–43)
Lymphocytes Absolute: 1.57 k/uL (ref 1.10–3.70)
MCH: 32.2 pg (ref 25.2–33.5)
MCHC: 31.2 g/dL (ref 28.4–34.8)
MCV: 103.2 fL — ABNORMAL HIGH (ref 82.6–102.9)
MPV: 10.3 fL (ref 8.1–13.5)
Monocytes %: 10 % (ref 3–12)
Monocytes Absolute: 1.04 k/uL (ref 0.10–1.20)
NRBC Automated: 0 /100{WBCs}
Neutrophils %: 68 % — ABNORMAL HIGH (ref 36–65)
Neutrophils Absolute: 7.57 k/uL (ref 1.50–8.10)
Platelets: 210 k/uL (ref 138–453)
RBC: 4.07 m/uL (ref 3.95–5.11)
RDW: 14.2 % (ref 11.8–14.4)
WBC: 11 k/uL (ref 3.5–11.3)

## 2023-10-11 NOTE — Discharge Instructions (Signed)
 DAY OF SURGERY/PROCEDURE  GUIDELINES    As a patient at [facility], you can expect quality medical and nursing care that is centered on your individual needs. It is our goal to make your surgical experience as comfortable and excellent as possible. Please call us  here at [facility] Pre-Admission testing with any questions [phone].  ________________________________________________________________________    The following instructions are general guidelines, if any information on this sheet is different from what your doctor has instructed you to do, please follow your doctor's instructions.    Please arrive on 10/3 @ 0700      Enter through C entrance. Check in at registration     Upon arrival you will be taken to the pre-operative area to get ready for surgery, your family will stay in the waiting room and visit with you once you are ready for surgery. Due to special limitations please limit visitation to 1-2 members of your family at a time. When it is time for surgery your family will return to the waiting room.    Staying hydrated is important, you may drink sips of water  up to 2 hours prior to your arrival time at the hospital.         Please stop eating all foods, and all other beverages or drinks after midnight prior to your surgery/procedure.    Do not smoke, suck or chew after midnight (no gum, mints, candy, cigarettes, cigars, pipes, snuff, chewing tobacco, vaping, marijuana, etc.) or your surgery may be canceled.         Certain procedures may require dietary instructions such as a clear liquid diet the day before your procedure. If you receive special dietary instructions from your physician performing your procedure, please follow these instructions and contact the office if you have questions.    IN CASE OF ILLNESS - If you have a cold or flu symptoms (high fever, runny nose, sore throat, cough, etc.) rash, nausea, vomiting, loose stools, and/or recent contact with someone who has  a contagious disease (chick pox, measles, etc.) please call your doctor before coming to the surgery center    Take a small sip of water  with PEPCID  and INHALER the morning of your surgery.    Please refer to your surgeon's instructions regarding holding any over the counter vitamins, herbals, supplements, etc. prior to your upcoming surgery.    If applicable bring your:  Inhaler (s)  Hearing aid(s)  Eyeglasses and Case (If you wear contacts they have to be removed before surgery, bring case and solution)  CPAP/Bipap  Portable oxygen tank or concentrator from home    DO NOT take anticoagulants (blood thinners, ibuprofen , Motrin , NSAIDS, aspirin  or aspirin -containing products) as instructed by your physician.    DO NOT take any diabetic pills or insulin morning of your surgery.     If you are taking a GLP-1 medication (Ozempic, Wegovy, Rybelsus, Mounjaro, etc.) and experience any abdominal symptoms (nausea, vomiting, diarrhea, abdominal pain, reflux, indigestion, bloating) please follow a clear liquid diet the day prior to your procedure. If you take the drug daily in pill form do not take it on the day of your surgery/procedure. If you take the drug weekly as an injection, stop taking it a week prior to your surgery/procedure.    Take a shower or bath the night prior to and on the morning of your surgery/procedure (Hibiclens if directed) Do not apply any lotions.    Brush your teeth, but do not swallow any water   Leave all jewelry at home and wear loose, comfortable clothing that is easy to put on and take off.     Do not wear makeup or nail polish and remove all body piercing's prior to arrival to the hospital.    If you will be returning home the same day as your surgery, you will need to have a responsible adult (68 years of age or older) present to drive you home or accompany you if you are taking a taxi. You will need someone to stay at the hospital during your surgery and with you at home for the first 24  hours following your surgery. This is due to the anesthesia and the medication given to you during surgery and recovery.

## 2023-10-13 ENCOUNTER — Inpatient Hospital Stay: Payer: MEDICARE | Attending: Orthopaedic Surgery of the Spine

## 2023-10-13 ENCOUNTER — Ambulatory Visit: Admit: 2023-10-13 | Payer: MEDICARE | Primary: Family Medicine

## 2023-10-13 MED ORDER — NORMAL SALINE FLUSH 0.9 % IV SOLN
0.9 | Freq: Two times a day (BID) | INTRAVENOUS | Status: DC
Start: 2023-10-13 — End: 2023-10-13

## 2023-10-13 MED ORDER — ROCURONIUM BROMIDE 50 MG/5ML IV SOLN
50 | INTRAVENOUS | Status: AC
Start: 2023-10-13 — End: 2023-10-13

## 2023-10-13 MED ORDER — CEFAZOLIN 2000 MG IN 20 ML SWFI IV SYRINGE (PREMIX)
Status: DC
Start: 2023-10-13 — End: 2023-10-13

## 2023-10-13 MED ORDER — LACTATED RINGERS IV SOLN
INTRAVENOUS | Status: DC
Start: 2023-10-13 — End: 2023-10-13
  Administered 2023-10-13 (×2): via INTRAVENOUS

## 2023-10-13 MED ORDER — ROCURONIUM BROMIDE 50 MG/5ML IV SOLN
50 | Freq: Once | INTRAVENOUS | Status: DC | PRN
Start: 2023-10-13 — End: 2023-10-13
  Administered 2023-10-13: 14:00:00 10 via INTRAVENOUS
  Administered 2023-10-13: 14:00:00 40 via INTRAVENOUS

## 2023-10-13 MED ORDER — EPHEDRINE SULFATE (PRESSORS) 25 MG/5ML IV SOSY
25 | INTRAVENOUS | Status: AC
Start: 2023-10-13 — End: 2023-10-13

## 2023-10-13 MED ORDER — SODIUM CHLORIDE 0.9 % IV SOLN
0.9 | INTRAVENOUS | Status: DC | PRN
Start: 2023-10-13 — End: 2023-10-13

## 2023-10-13 MED ORDER — LIDOCAINE HCL (PF) 1 % IJ SOLN
1 | Freq: Once | INTRAMUSCULAR | Status: DC | PRN
Start: 2023-10-13 — End: 2023-10-13
  Administered 2023-10-13: 14:00:00 50 via INTRAVENOUS

## 2023-10-13 MED ORDER — PROPOFOL 200 MG/20ML IV EMUL
200 | INTRAVENOUS | Status: AC
Start: 2023-10-13 — End: 2023-10-13

## 2023-10-13 MED ORDER — EPHEDRINE SULFATE (PRESSORS) 25 MG/5ML IV SOSY
25 | Freq: Once | INTRAVENOUS | Status: DC | PRN
Start: 2023-10-13 — End: 2023-10-13
  Administered 2023-10-13 (×4): 5 via INTRAVENOUS

## 2023-10-13 MED ORDER — HYDROMORPHONE HCL 1 MG/ML IJ SOLN
1 | INTRAMUSCULAR | Status: DC | PRN
Start: 2023-10-13 — End: 2023-10-13

## 2023-10-13 MED ORDER — SUGAMMADEX SODIUM 200 MG/2ML IV SOLN
200 | Freq: Once | INTRAVENOUS | Status: DC | PRN
Start: 2023-10-13 — End: 2023-10-13
  Administered 2023-10-13: 15:00:00 200 via INTRAVENOUS

## 2023-10-13 MED ORDER — TRAMADOL HCL 50 MG PO TABS
50 | Freq: Once | ORAL | Status: AC | PRN
Start: 2023-10-13 — End: 2023-10-13

## 2023-10-13 MED ORDER — TRAMADOL HCL 50 MG PO TABS
50 | ORAL | Status: AC
Start: 2023-10-13 — End: 2023-10-13
  Administered 2023-10-13: 16:00:00 50 via ORAL

## 2023-10-13 MED ORDER — METOCLOPRAMIDE HCL 5 MG/ML IJ SOLN
5 | Freq: Once | INTRAMUSCULAR | Status: DC | PRN
Start: 2023-10-13 — End: 2023-10-13

## 2023-10-13 MED ORDER — HYDRALAZINE HCL 20 MG/ML IJ SOLN
20 | INTRAMUSCULAR | Status: DC | PRN
Start: 2023-10-13 — End: 2023-10-13

## 2023-10-13 MED ORDER — HYDROMORPHONE HCL 1 MG/ML IJ SOLN
1 | INTRAMUSCULAR | Status: DC | PRN
Start: 2023-10-13 — End: 2023-10-13
  Administered 2023-10-13: 16:00:00 0.5 mg via INTRAVENOUS

## 2023-10-13 MED ORDER — CEFAZOLIN SODIUM 1 G IJ SOLR
1 | Freq: Once | INTRAMUSCULAR | Status: DC | PRN
Start: 2023-10-13 — End: 2023-10-13
  Administered 2023-10-13: 14:00:00 2 via INTRAVENOUS

## 2023-10-13 MED ORDER — FENTANYL CITRATE (PF) 100 MCG/2ML IJ SOLN
100 | Freq: Once | INTRAMUSCULAR | Status: DC | PRN
Start: 2023-10-13 — End: 2023-10-13
  Administered 2023-10-13: 15:00:00 25 via INTRAVENOUS
  Administered 2023-10-13: 14:00:00 50 via INTRAVENOUS

## 2023-10-13 MED ORDER — ONDANSETRON HCL 4 MG/2ML IJ SOLN
4 | INTRAMUSCULAR | Status: AC
Start: 2023-10-13 — End: 2023-10-13

## 2023-10-13 MED ORDER — METHOCARBAMOL 750 MG PO TABS
750 | ORAL_TABLET | Freq: Four times a day (QID) | ORAL | 0 refills | 10.00000 days | Status: AC
Start: 2023-10-13 — End: 2023-10-23
  Filled 2023-10-13: qty 40, 10d supply, fill #0

## 2023-10-13 MED ORDER — DEXAMETHASONE 4 MG/ML IJ SOLN (MIXTURES ONLY)
4 | INTRAMUSCULAR | Status: AC
Start: 2023-10-13 — End: 2023-10-13

## 2023-10-13 MED ORDER — NORMAL SALINE FLUSH 0.9 % IV SOLN
0.9 | INTRAVENOUS | Status: DC | PRN
Start: 2023-10-13 — End: 2023-10-13

## 2023-10-13 MED ORDER — HYDROCODONE-ACETAMINOPHEN 5-325 MG PO TABS
5-325 | ORAL_TABLET | Freq: Three times a day (TID) | ORAL | 0 refills | Status: DC | PRN
Start: 2023-10-13 — End: 2023-10-13

## 2023-10-13 MED ORDER — MEPERIDINE HCL 50 MG/ML IJ SOLN
50 | INTRAMUSCULAR | Status: DC | PRN
Start: 2023-10-13 — End: 2023-10-13

## 2023-10-13 MED ORDER — DEXAMETHASONE 4 MG/ML IJ SOLN (MIXTURES ONLY)
4 | Freq: Once | INTRAMUSCULAR | Status: DC | PRN
Start: 2023-10-13 — End: 2023-10-13
  Administered 2023-10-13: 14:00:00 8 via INTRAVENOUS

## 2023-10-13 MED ORDER — ONDANSETRON HCL 4 MG/2ML IJ SOLN
4 | Freq: Once | INTRAMUSCULAR | Status: DC | PRN
Start: 2023-10-13 — End: 2023-10-13

## 2023-10-13 MED ORDER — FENTANYL CITRATE (PF) 100 MCG/2ML IJ SOLN
100 | INTRAMUSCULAR | Status: AC
Start: 2023-10-13 — End: 2023-10-13

## 2023-10-13 MED ORDER — BUPIVACAINE-EPINEPHRINE 0.5% -1:200000 IJ SOLN
INTRAMUSCULAR | Status: DC | PRN
Start: 2023-10-13 — End: 2023-10-13
  Administered 2023-10-13: 14:00:00 10 via INTRAMUSCULAR

## 2023-10-13 MED ORDER — GLYCOPYRROLATE 1 MG/5ML IJ SOLN
1 | Freq: Once | INTRAMUSCULAR | Status: DC
Start: 2023-10-13 — End: 2023-10-13

## 2023-10-13 MED ORDER — OXYCODONE-ACETAMINOPHEN 5-325 MG PO TABS
5-325 | Freq: Once | ORAL | Status: DC | PRN
Start: 2023-10-13 — End: 2023-10-13

## 2023-10-13 MED ORDER — IPRATROPIUM-ALBUTEROL 0.5-2.5 (3) MG/3ML IN SOLN
0.5-2.5 | Freq: Once | RESPIRATORY_TRACT | Status: DC | PRN
Start: 2023-10-13 — End: 2023-10-13

## 2023-10-13 MED ORDER — TRAMADOL HCL 50 MG PO TABS
50 | ORAL_TABLET | Freq: Three times a day (TID) | ORAL | 0 refills | 7.00000 days | Status: DC | PRN
Start: 2023-10-13 — End: 2023-12-27
  Filled 2023-10-13: qty 21, 7d supply, fill #0

## 2023-10-13 MED ORDER — SODIUM CHLORIDE (PF) 0.9 % IJ SOLN
0.9 | INTRAMUSCULAR | Status: DC | PRN
Start: 2023-10-13 — End: 2023-10-13

## 2023-10-13 MED ORDER — DIPHENHYDRAMINE HCL 50 MG/ML IJ SOLN
50 | Freq: Once | INTRAMUSCULAR | Status: DC | PRN
Start: 2023-10-13 — End: 2023-10-13

## 2023-10-13 MED ORDER — LIDOCAINE HCL (PF) 1 % IJ SOLN
1 | INTRAMUSCULAR | Status: AC
Start: 2023-10-13 — End: 2023-10-13

## 2023-10-13 MED ORDER — MIDAZOLAM HCL (PF) 2 MG/2ML IJ SOLN
2 | Freq: Once | INTRAMUSCULAR | Status: DC | PRN
Start: 2023-10-13 — End: 2023-10-13

## 2023-10-13 MED ORDER — SODIUM CHLORIDE 0.9 % IV SOLN
0.9 | INTRAVENOUS | Status: DC
Start: 2023-10-13 — End: 2023-10-13

## 2023-10-13 MED ORDER — PROPOFOL 200 MG/20ML IV EMUL
200 | Freq: Once | INTRAVENOUS | Status: DC | PRN
Start: 2023-10-13 — End: 2023-10-13
  Administered 2023-10-13: 14:00:00 130 via INTRAVENOUS

## 2023-10-13 MED ORDER — LABETALOL HCL 5 MG/ML IV SOLN
5 | INTRAVENOUS | Status: DC | PRN
Start: 2023-10-13 — End: 2023-10-13

## 2023-10-13 MED FILL — DILAUDID 1 MG/ML IJ SOLN: 1 mg/mL | INTRAMUSCULAR | Qty: 0.5 | Fill #0

## 2023-10-13 MED FILL — TRAMADOL HCL 50 MG PO TABS: 50 mg | ORAL | Qty: 1 | Fill #0

## 2023-10-13 MED FILL — DIPRIVAN 200 MG/20ML IV EMUL: 200 MG/20ML | INTRAVENOUS | Qty: 20 | Fill #0

## 2023-10-13 MED FILL — ONDANSETRON HCL 4 MG/2ML IJ SOLN: 4 MG/2ML | INTRAMUSCULAR | Qty: 2 | Fill #0

## 2023-10-13 MED FILL — FENTANYL CITRATE (PF) 100 MCG/2ML IJ SOLN: 100 MCG/2ML | INTRAMUSCULAR | Qty: 2 | Fill #0

## 2023-10-13 MED FILL — ROCURONIUM BROMIDE 50 MG/5ML IV SOLN: 50 MG/5ML | INTRAVENOUS | Qty: 5 | Fill #0

## 2023-10-13 MED FILL — CEFAZOLIN 2000 MG IN 20 ML SWFI IV SYRINGE (PREMIX): Qty: 2000 | Fill #0

## 2023-10-13 MED FILL — LIDOCAINE HCL (PF) 1 % IJ SOLN: 1 % | INTRAMUSCULAR | Qty: 5 | Fill #0

## 2023-10-13 MED FILL — DEXAMETHASONE SODIUM PHOSPHATE 4 MG/ML IJ SOLN: 4 mg/mL | INTRAMUSCULAR | Qty: 1 | Fill #0

## 2023-10-13 MED FILL — AKOVAZ 25 MG/5ML IV SOSY: 25 MG/5ML | INTRAVENOUS | Qty: 5 | Fill #0

## 2023-10-13 NOTE — Op Note (Signed)
 "Operative Note      Patient: Katie Juarez  Date of Birth: 06-01-1940  MRN: [id number]    Date of Procedure: 10-21-2023    Pre-Op Diagnosis Codes:      * Disorder of sacrum [M53.3]    Post-Op Diagnosis:     * Sacroiliitis, right       Procedure(s):  MINIMALLY INVASIVE SACROILIAC JOINT FUSION (WITH SI BONE), RIGHT [27279]    Surgeon(s):  Lynnea Prentice PEDLAR, DO    Assistant:   * No surgical staff found *    Anesthesia: General    Estimated Blood Loss (mL): Minimal    Complications: None    Specimens:   * No specimens in log *    Implants:  Implant Name Type Inv. Item Serial No. Manufacturer Lot No. LRB No. Used Action   JOINT SACROILIAC 11.5X40 MM IFUSE-TORQ - [id number]  JOINT SACROILIAC 11.5X40 MM IFUSE-TORQ  [employer] [id number] N/A 1 Implanted   JOINT SACROILIAC 11.5X50 MM IFUSE-TORQ - [id number]  JOINT SACROILIAC 11.5X50 MM IFUSE-TORQ  [employer] [id number] N/A 1 Implanted   JOINT SACROILIAC 11.5X40 MM IFUSE-TORQ - [id number]  JOINT SACROILIAC 11.5X40 MM IFUSE-TORQ  [employer]WD [id number] N/A 1 Implanted   PIN FIX DIA3. EXCHG DISP FOR IFUSE IMPL SYS - [id number]  PIN FIX DIA3. EXCHG DISP FOR IFUSE IMPL SYS  [employer]WD  N/A 1 Implanted         Drains: * No LDAs found *    Findings:  Present At Time Of Surgery (PATOS) (choose all levels that have infection present):  No infection present  Other Findings: previous kyphoplasties and L4-5 fusion    Detailed Description of Procedure:    Indications:       Katie Juarez is a pleasant 83 year old female with history of previous kyphoplasties and L4-5 fusion with long-standing right-sided lower back pain overlying the sacroiliac joint. Imaging demonstrated degenerative changes to bilateral SI joints and multiple physical exam maneuvers exacerbated right SI pain. Patient had lack of significant improvement with symptoms despite multiple conservative interventions including physical therapy, activity modification, SI belt, and by mouth medications. Patient got  significant though only temporary relief with right SI joint injection. Treatment options were discussed with patient for continued non-operative management versus operative intervention with right minimally invasive sacroiliac arthrodesis. Benefits and risks of surgery were discussed at great length including but not limited to bleeding, infection, damage to nerves and blood vessels, wound complications, residual pain and stiffness, hardware complications, need for additional surgery, blood clots, risks of anesthesia, and death. Given ongoing pain and symptoms limiting his daily function and quality of life, patient elected to proceed with surgery.     Operative Technique:      Patient was met in the pre-operative holding area at which time history and physical was performed, operative site was marked, and informed consent was obtained. Patient was then transported to the operating theatre and placed under general anesthesia without difficulty by the anesthesia team. Patient was then transferred from the supine position on stretcher to prone position on flat Toco with gel rolls at chest and pelvis level. All bony prominences were appropriately padded including eyes and axillae. Operative site was prepped and draped in a standard sterile fashion and a formal time out procedure was performed and agreed upon by all personnel present in room confirming correct patient, operative site, and procedure.     Incision site was marked utilizing a guidewire and fluoroscopy to draw out  a line bisecting the middle of the sacral body and another along the line of overlapped iliocortical densities. At the intersection of these lines an incision was created spanning approximately 5 cm along the lateral gluteal region. Skin, subcutaneous tissue, and deep gluteal fascia was incised in line with the incision and blunt finger dissection into the gluteal musculature performed. Following this, first guidewire was placed at the  cephalad portion of the mid sacral body just distal to the iliac cortical densities. Inlet and outlet views were used to ensure wire appropriately in the anterior-to-posterior and cephalocaudal directions so as not to pierce the anterior sacral cortex and avoid the S1 and S2 sacral foramina.     First guidewire was appropriately placed above the S1 foramen. Following this, guidewire was advanced through the sacroiliac joint until lateral margin of S1 foramen. Parallel pin guide was then used to place second pin in similar fashion, followed by placement of third and final pin. Lateral, inlet, and outlet views were utilized throughout to ensure appropriate trajectory and placement. Initial dilator was placed over guidewire and length measurement for all 3 SI Torq implants obtained (40 mm, 50 mm, and 40 mm). Drill was then used over the guidewire and advanced just past the SI joint. An 11.5 x 40 mm iFuse TORQ screw was placed, followed by 11.5 x 50 mm and finally 11.5 x 40 mm placed with inlet and outlet views to ensure proper placement avoiding S1 and S2 sacral foramina and not piercing anterior sacral cortex. After all implants were appropriately placed, fluoroscopy was used to confirm appropriate final implant position with no issues.     All instrument and sponge counts were correct. Incision was then closed in layered fashion with 0-Vicryl, 2-0 Vicryl, and 3-0 Monocryl. Dermabond was placed over skin. Sterile surgical dressing of Telfa nonstick gauze, 4 x 4 cotton gauze, and Tegaderm was placed. Patient was then reversed from general anesthesia without issue and transferred to stretcher in supine position. Patient was then transported from the operating theatre to the post-anesthesia care unit in stable condition. Patient will be discharged home after recovered from anesthesia. Weight bearing as tolerated with walker and follow up in office 2 weeks.  "

## 2023-10-13 NOTE — Anesthesia Pre Procedure (Signed)
"Department of Anesthesiology  Preprocedure Note       Name:  Katie Juarez   Age:  83 y.o.  DOB:  [birth year]                                          MRN:  [id number]         Date:  10/13/2023      Surgeon: Clotilde):  Lynnea Prentice PEDLAR, DO    Procedure: Procedure(s):  SACROILIAC JOINT FUSION POSTERIOR WITH SI BONE    Medications prior to admission:   Prior to Admission medications   Medication Sig Start Date End Date Taking? Authorizing Provider   gabapentin  (NEURONTIN ) 100 MG capsule Take 3 capsules by mouth at bedtime for 90 days.  Patient taking differently: Take 2 capsules by mouth at bedtime. 08/03/23 11/01/23 Yes Black, Karin K, DO   LORazepam  (ATIVAN ) 0.5 MG tablet Take 1 tablet by mouth nightly as needed for Anxiety for up to 90 days. Max Daily Amount: 0.5 mg 08/03/23 11/01/23 Yes Black, Karin K, DO   INCRUSE ELLIPTA  62.5 MCG/ACT inhaler Inhale 1 puff into the lungs daily 07/03/23  Yes Black, Karin K, DO   donepezil  (ARICEPT ) 10 MG tablet Take 1 tablet by mouth nightly 06/20/23  Yes Black, Karin K, DO   famotidine  (PEPCID ) 40 MG tablet Take 1 tablet by mouth 2 times daily 05/03/23  Yes Black, Karin K, DO   citalopram  (CELEXA ) 20 MG tablet Take 1 tablet by mouth daily 03/30/23  Yes Black, Karin K, DO   ibuprofen  (ADVIL ;MOTRIN ) 800 MG tablet 1 tablet 02/15/23  Yes [provider]   atorvastatin  (LIPITOR ) 20 MG tablet Take 1 tablet by mouth daily 12/26/22  Yes Black, Karin K, DO   docusate sodium  (STOOL SOFTENER) 100 MG capsule Take 1 capsule by mouth as needed   Yes [provider]   b complex vitamins capsule Take 1 capsule by mouth daily   Yes [provider]   EQ 8HR ARTHRITIS PAIN RELIEF 650 MG extended release tablet  08/05/21  Yes [provider]   latanoprost  (XALATAN ) 0.005 % ophthalmic solution  08/09/21  Yes [provider]   CRANBERRY PO Take 1 capsule by mouth as needed   Yes [provider]   Cholecalciferol (VITAMIN D3) 50 MCG (2000 UT) CAPS Take 1  capsule by mouth daily   Yes [provider]   CALCIUM  CARBONATE-VITAMIN D PO Take 600 mg by mouth 2 times daily Contains 600 mg Calcium  & 800 IU Vitamin D3.   Yes [provider]   Multiple Vitamins-Minerals (THERAPEUTIC MULTIVITAMIN-MINERALS) tablet Take 1 tablet by mouth daily   Yes [provider]   polyvinyl alcohol-povidone (HYPOTEARS) 1.4-0.6 % ophthalmic solution Place 1-2 drops into both eyes as needed   Yes [provider]   Handicap Placard MISC by Does not apply route Issue parking placard for person with disability; Applicant meets the qualifying disability criteria. Length of time expected to have disability__X___Lifetime. 05/03/23   Black, Karin K, DO   fluticasone  (ARNUITY ELLIPTA ) 100 MCG/ACT AEPB Inhale 1 puff into the lungs daily  Patient not taking: Reported on 10/13/2023 05/03/23   Black, Karin K, DO   ondansetron  (ZOFRAN ) 4 MG tablet Take 1 tablet by mouth every 8 hours as needed  Patient not taking: Reported on 10/13/2023 02/22/23   [provider]  Blood Pressure Monitoring (COMFORT TOUCH BP CUFF/MEDIUM) MISC Use daily to check blood pressure. 03/20/23   Black, Karin K, DO   omeprazole  (PRILOSEC) 40 MG delayed release capsule Take 1 capsule by mouth daily (with breakfast)  Patient not taking: Reported on 10/13/2023 03/20/23   Black, Karin K, DO   nitroGLYCERIN  (NITROSTAT ) 0.4 MG SL tablet Place 1 tablet under the tongue every 5 minutes as needed for Chest pain  Patient not taking: Reported on 10/13/2023 06/02/21   Lunette Almarie BRAVO, APRN - CNP   polyethylene glycol (GLYCOLAX ) 17 GM/SCOOP powder Take 17 g by mouth daily As needed.  Patient not taking: Reported on 10/13/2023    [provider]       Current medications:    Current Facility-Administered Medications   Medication Dose Route Frequency Provider Last Rate Last Admin    0.9 % sodium chloride  infusion   IntraVENous Continuous Heinz Dutton, MD        lactated ringers  infusion   IntraVENous  Continuous Heinz Dutton, MD 125 mL/hr at 10/13/23 0813 NoRateChange at 10/13/23 0813    sodium chloride  flush 0.9 % injection 5-40 mL  5-40 mL IntraVENous 2 times per day Heinz Dutton, MD        sodium chloride  flush 0.9 % injection 5-40 mL  5-40 mL IntraVENous PRN Heinz Dutton, MD        0.9 % sodium chloride  infusion   IntraVENous PRN Heinz Dutton, MD        ceFAZolin  2000 mg in 20 mL SWFI IV Syringe IV syringe                Allergies:    Allergies   Allergen Reactions    Morphine Other (See Comments)     Night sweats,felt loopy.    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    Pain of both sacroiliac joints M53.3    Piriformis syndrome of left side G57.02    Lumbar radicular pain M54.16    Neural foraminal stenosis of lumbar spine M48.061    Chronic low back pain M54.50, G89.29    Spinal stenosis, lumbar M48.061    Acquired absence of other specified parts of digestive tract Z90.49    Arthrodesis status Z98.1    Asymptomatic menopausal state Z78.0    Extravasation of urine R39.0    Gastro-esophageal reflux disease without esophagitis K21.9    Hyperlipidemia, unspecified E78.5    Closed compression fracture of body of L1 vertebra (HCC) S32.010A    Lumbar burst fracture, open, initial encounter (HCC) S32.001B    Pharyngoesophageal dysphagia R13.14    Anxiety F41.9    Benign essential tremor G25.0    Dizziness R42    Memory problem R41.3    Balance problems R26.89    Chronic cerebral ischemia I67.82    Neck stiffness M43.6    Tingling sensation R20.2    Left arm weakness R29.898    Dysphagia R13.10    Brain dysfunction G93.89    Carotid stenosis, asymptomatic, bilateral I65.23    Knee pain M25.569    Fatigue R53.83     Osteoarthrosis M19.90    Alzheimer's disease, unspecified G30.9  Chronic obstructive pulmonary disease, unspecified J44.9    Hypertensive urgency I16.0    Bradycardia R00.1    Frequent falls R29.6    Thrush, oral B37.0    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    Diverticulosis K57.90    Coronary artery disease involving native heart without angina pectoris I25.10    Essential hypertension I10    Senile osteoporosis M81.0       Past Medical History:        Diagnosis Date    Arthritis     Basal cell carcinoma (BCC) of face 10/2018    excised by Dr Bethel     Breast cancer [facility]) 1999    in remission    Chronic pain     Constipation 12/27/2018    COPD (chronic obstructive pulmonary disease) (HCC)     COVID-19 11/06/2020    DDD (degenerative disc disease)     Diverticulitis 06/28/2018    GERD (gastroesophageal reflux disease)     Ileus (HCC) 08/07/2021    Large bowel obstruction (HCC) 02/2015    [place]    LBP (low back pain)     Osteoporosis     per DEXA 05/26/22    Pharyngoesophageal dysphagia     SS (spinal stenosis)        Past Surgical History:        Procedure Laterality Date    APPENDECTOMY  1956    BLADDER SUSPENSION  1990    BREAST LUMPECTOMY Right 1999    CARDIAC CATHETERIZATION  12/21/2020    Dr Glady    CHOLECYSTECTOMY  1990    EXCISION/BIOPSY Left 11/01/2018    Dr Bethel / [place], for [facility]    FEMORAL HERNIA REPAIR Left 02/20/2015    strangulated with small bowel resection - [place] ,  [facility], [id number]    HYSTERECTOMY (CERVIX STATUS UNKNOWN)  1980    Done for endometriosis; done in [place]    LUMBAR SPINE SURGERY  01/03/2019    VERTEBRAL AUMENTATION  L1      OTHER SURGICAL HISTORY  11/14/13, 06/23/11, 06/30/11, 07/12/11 12/29/11    L4/L5 IESI    OTHER SURGICAL HISTORY  12/17/2013    L5/S1 IESI    OTHER SURGICAL HISTORY Bilateral 05/30/2014    SIJ and piriformis    OTHER SURGICAL HISTORY Bilateral 09/16/2014    L3, L4, L5 Diagnostic Medial Branch Block     OTHER SURGICAL HISTORY Bilateral 09/26/2014    L5 TFE    OTHER SURGICAL HISTORY  10/10/2014    bil L5 TFE    OTHER SURGICAL HISTORY  10/31/2014    caudal    SMALL INTESTINE SURGERY  02/20/2015    strangulated left femoral hernia    SPINE SURGERY N/A 01/03/2019    VERTEBRAL AUMENTATION  L1  (C-ARM X2, performed by Debby KANDICE Mitts, MD at [facility] OR    TONSILLECTOMY  1946    UPPER GASTROINTESTINAL ENDOSCOPY N/A 04/24/2019    EGD BIOPSY performed by Reyes DELENA Griffin, MD at [facility] OR       Social History:    Social History     Tobacco Use    Smoking status: Former     Current packs/day: 0.00     Average packs/day: 1 pack/day for 40.0 years (40.0 ttl pk-yrs)     Types: Cigarettes     Start date: 01/10/1953     Quit date: 01/11/1988     Years since quitting: 35.7     Passive exposure:  Past    Smokeless tobacco: Never    Tobacco comments:     quit 25 years ago 1990   Substance Use Topics    Alcohol use: No     Alcohol/week: 0.0 standard drinks of alcohol                                Counseling given: Not Answered  Tobacco comments: quit 25 years ago 1990      Vital Signs (Current):   Vitals:    10/13/23 0750   BP: (!) 143/79   Pulse: 65   Resp: 17   Temp: 98.7 F (37.1 C)   TempSrc: Temporal   SpO2: 93%   Weight: 56.2 kg (124 lb)   Height: 1.626 m (5' 4)                                              BP Readings from Last 3 Encounters:   10/13/23 (!) 143/79   10/11/23 109/64   07/03/23 110/70       NPO Status: Time of last liquid consumption: 0600 (sips with meds)                        Time of last solid consumption: 1730                        Date of last liquid consumption: 10/13/23                        Date of last solid food consumption: 10/12/23    BMI:   Wt Readings from Last 3 Encounters:   10/13/23 56.2 kg (124 lb)   10/11/23 56.7 kg (125 lb)   07/03/23 54.4 kg (120 lb)     Body mass index is 21.28 kg/m.    CBC:   Lab Results   Component Value Date/Time    WBC 11.0 10/11/2023 08:48 AM    RBC 4.07  10/11/2023 08:48 AM    RBC 4.29 08/07/2021 01:10 PM    HGB 13.1 10/11/2023 08:48 AM    HCT 42.0 10/11/2023 08:48 AM    MCV 103.2 10/11/2023 08:48 AM    RDW 14.2 10/11/2023 08:48 AM    PLT 210 10/11/2023 08:48 AM       CMP:   Lab Results   Component Value Date/Time    NA 142 10/11/2023 08:48 AM    K 3.8 10/11/2023 08:48 AM    CL 104 10/11/2023 08:48 AM    CO2 26 10/11/2023 08:48 AM    BUN 25 10/11/2023 08:48 AM    CREATININE 1.0 10/11/2023 08:48 AM    GFRAA >60 06/15/2020 12:01 PM    AGRATIO 1.4 09/27/2017 08:50 AM    LABGLOM 56 10/11/2023 08:48 AM    LABGLOM >60 12/28/2021 02:10 PM    GLUCOSE 104 10/11/2023 08:48 AM    GLUCOSE 88 08/07/2021 01:10 PM    CALCIUM  9.3 10/11/2023 08:48 AM    BILITOT 0.3 05/03/2023 01:28 PM    ALKPHOS 84 05/03/2023 01:28 PM    AST 18 05/03/2023 01:28 PM    ALT 9 05/03/2023 01:28 PM       POC Tests: No results for input(s): POCGLU, POCNA, POCK,  POCCL, POCBUN, POCHEMO, POCHCT in the last 72 hours.    Coags:   Lab Results   Component Value Date/Time    PROTIME 10.2 01/02/2019 07:11 PM    INR 1.0 01/02/2019 07:11 PM       HCG (If Applicable): No results found for: PREGTESTUR, PREGSERUM, HCG, HCGQUANT     ABGs: No results found for: PHART, PO2ART, PCO2ART, HCO3ART, BEART, O2SATART     Type & Screen (If Applicable):  No results found for: ABORH, LABANTI    Drug/Infectious Status (If Applicable):  No results found for: HIV, HEPCAB    COVID-19 Screening (If Applicable):   Lab Results   Component Value Date/Time    COVID19 Negative 01/11/2023 12:33 PM    COVID19 Not Detected 04/19/2019 09:38 AM    COVID19 Not Detected 01/02/2019 09:32 AM           Anesthesia Evaluation    Patient summary reviewed and Nursing notes reviewed      Airway:  Mallampati: II  TM distance: >3 FB   Neck ROM: full    Mouth opening: > = 3 FB   Dental:       Comment: -MISSING ONE UPPER RIGHT TOOTH    Pulmonary:   normal exam     (+)    COPD:                                ROS comment:  -QUIT SMOKING 1991   Cardiovascular:       (+)           CAD:  :                                    ECG reviewed                 ROS comment: -BILATERAL CAROTID ARTERY STENOSIS  -EKG - SR @ 69     Neuro/Psych:    (+)               neurocognitive disorder or dementia     ROS comment: -GLAUCOMA  -ALZHEIMER'S DISEASE  -DEMENTIA  -SPINAL STENOSIS  -OSTEOPOROSIS  -DDD  -TREMOR        GI/Hepatic/Renal:    (+)   GERD: well controlled                Endo/Other:     (+)   :  :  arthritis:    malignancy/cancer            ROS comment: -BREAST CANCER  -SKIN CANCER  -NPO AFTER MIDNIGHT  -ALLERGIES - MORPHINE, PCN, ZITHROMAX       Abdominal:  normal exam            Vascular:  negative vascular ROS.       Other Findings:            Anesthesia Plan      general     ASA 3     (GETA; PRONE)  Induction: intravenous.    MIPS: Postoperative opioids intended and Prophylactic antiemetics administered.  Anesthetic plan and risks discussed with patient.      Plan discussed with CRNA.    Attending anesthesiologist reviewed and agrees with Preprocedure content              SAMMI BOTTS, MD  10/13/2023            "

## 2023-10-13 NOTE — Brief Op Note (Signed)
"  Brief Postoperative Note      Patient: Katie Juarez  Date of Birth: 04-06-1940  MRN: [id number]    Date of Procedure: 2023-11-09    Pre-Op Diagnosis Codes:      * Disorder of sacrum [M53.3]    Post-Op Diagnosis: Same       Procedure(s):  RIGHT SACROILIAC JOINT FUSION WITH SI BONE [27279]    Surgeon(s):  Lynnea Prentice PEDLAR, DO    Assistant:  * No surgical staff found *    Anesthesia: General    Estimated Blood Loss (mL): Minimal    Complications: None    Specimens:   * No specimens in log *    Implants:  Implant Name Type Inv. Item Serial No. Manufacturer Lot No. LRB No. Used Action   JOINT SACROILIAC 11.5X40 MM IFUSE-TORQ - [id number]  JOINT SACROILIAC 11.5X40 MM IFUSE-TORQ  [employer]WD [id number] N/A 1 Implanted   JOINT SACROILIAC 11.5X50 MM IFUSE-TORQ - [id number]  JOINT SACROILIAC 11.5X50 MM IFUSE-TORQ  [employer] [id number] N/A 1 Implanted   JOINT SACROILIAC 11.5X40 MM IFUSE-TORQ - [id number]  JOINT SACROILIAC 11.5X40 MM IFUSE-TORQ  [employer]WD [id number] N/A 1 Implanted   PIN FIX DIA3. EXCHG DISP FOR IFUSE IMPL SYS - [id number]  PIN FIX DIA3. EXCHG DISP FOR IFUSE IMPL SYS  [employer]WD  N/A 1 Implanted     Drains: * No LDAs found *    Findings:  Present At Time Of Surgery (PATOS) (choose all levels that have infection present):  No infection present  Other Findings: previous L4-5 fusion, SI joint degenerative changes bilaterally    Electronically signed by Prentice PEDLAR Lynnea, DO on November 09, 2023 at 11:06 AM  "

## 2023-10-13 NOTE — Discharge Instructions (Addendum)
"  Orthopedic Spine Discharge Instructions:  -Mobilize as tolerated. Avoid excessive Bending, Lifting, and Twisting (BLT).  -Maintain surgical dressing in place until follow-up if possible. May shower in 48 hours, let water  run over dressing, but do not scrub. If dressing comes off with activity and hygiene, may leave uncovered if no drainage. Otherwise may replace with simple gauze and tape dressing.  -Ice (20 minutes on and off 1 hour) as needed for swelling/pain.  -Call the office or come to Emergency Room if signs of infection appear (hot, swollen, red, draining pus, fever).  -Take medications as prescribed.  -Wean off narcotics (Percocet /Norco) as soon as possible. Do not take Tylenol  if still taking narcotics.  -Follow up with Dr. Lynnea in his office 2 weeks after surgery/discharge. Call [phone] to schedule.    Activity  You have had anesthesia today  Do not drive, operate heavy equipment, consume alcoholic beverages, or make any important decisions  for 24 hours   If you are taking pain medication: Do not drive or consume alcohol.  Take your time changing positions today. You may feel light headed or dizzy if you move too quickly.   Continue your home medications as ordered by your physician.  Diet   You can eat your normal diet when you feel well. You should start off with bland foods like chicken soup, toast, or yogurt. Then advance as tolerated.  Drink plenty of fluids (unless your doctor tells you not to). Your urine should be very lightly colored without a strong odor.     "

## 2023-10-13 NOTE — H&P (Signed)
 "Surgical H&P    Reason for surgery: The patient is a 83 y.o. female with history of back pain and SI joint pain. She had good though only temporary relief with SI injections. She presents today for elective MIS SI joint fusion on right.    Past Medical History:    Past Medical History:   Diagnosis Date    Arthritis     Basal cell carcinoma (BCC) of face 10/2018    excised by Dr Bethel     Breast cancer [facility]) 1999    in remission    Chronic pain     Constipation 12/27/2018    COPD (chronic obstructive pulmonary disease) (HCC)     COVID-19 11/06/2020    DDD (degenerative disc disease)     Diverticulitis 06/28/2018    GERD (gastroesophageal reflux disease)     Ileus (HCC) 08/07/2021    Large bowel obstruction (HCC) 02/2015    [place]    LBP (low back pain)     Osteoporosis     per DEXA 05/26/22    Pharyngoesophageal dysphagia     SS (spinal stenosis)      Past Surgical History:    Past Surgical History:   Procedure Laterality Date    APPENDECTOMY  1956    BLADDER SUSPENSION  1990    BREAST LUMPECTOMY Right 1999    CARDIAC CATHETERIZATION  12/21/2020    Dr Glady    CHOLECYSTECTOMY  1990    EXCISION/BIOPSY Left 11/01/2018    Dr Bethel / [place], for [facility]    FEMORAL HERNIA REPAIR Left 02/20/2015    strangulated with small bowel resection - [place] ,  [facility], [id number]    HYSTERECTOMY (CERVIX STATUS UNKNOWN)  1980    Done for endometriosis; done in [place]    LUMBAR SPINE SURGERY  01/03/2019    VERTEBRAL AUMENTATION  L1      OTHER SURGICAL HISTORY  11/14/13, 06/23/11, 06/30/11, 07/12/11 12/29/11    L4/L5 IESI    OTHER SURGICAL HISTORY  12/17/2013    L5/S1 IESI    OTHER SURGICAL HISTORY Bilateral 05/30/2014    SIJ and piriformis    OTHER SURGICAL HISTORY Bilateral 09/16/2014    L3, L4, L5 Diagnostic Medial Branch Block    OTHER SURGICAL HISTORY Bilateral 09/26/2014    L5 TFE    OTHER SURGICAL HISTORY  10/10/2014    bil L5 TFE    OTHER SURGICAL HISTORY  10/31/2014    caudal    SMALL INTESTINE SURGERY   02/20/2015    strangulated left femoral hernia    SPINE SURGERY N/A 01/03/2019    VERTEBRAL AUMENTATION  L1  (C-ARM X2, performed by Debby KANDICE Mitts, MD at [facility] OR    TONSILLECTOMY  1946    UPPER GASTROINTESTINAL ENDOSCOPY N/A 04/24/2019    EGD BIOPSY performed by Reyes DELENA Griffin, MD at [facility] OR     Medications Prior to Admission:   Prior to Admission medications   Medication Sig Start Date End Date Taking? Authorizing Provider   gabapentin  (NEURONTIN ) 100 MG capsule Take 3 capsules by mouth at bedtime for 90 days.  Patient taking differently: Take 2 capsules by mouth at bedtime. 08/03/23 11/01/23 Yes Black, Karin K, DO   LORazepam  (ATIVAN ) 0.5 MG tablet Take 1 tablet by mouth nightly as needed for Anxiety for up to 90 days. Max Daily Amount: 0.5 mg 08/03/23 11/01/23 Yes Black, Karin K, DO   INCRUSE ELLIPTA  62.5 MCG/ACT inhaler Inhale 1 puff into  the lungs daily 07/03/23  Yes Black, Karin K, DO   donepezil  (ARICEPT ) 10 MG tablet Take 1 tablet by mouth nightly 06/20/23  Yes Black, Karin K, DO   famotidine  (PEPCID ) 40 MG tablet Take 1 tablet by mouth 2 times daily 05/03/23  Yes Black, Karin K, DO   citalopram  (CELEXA ) 20 MG tablet Take 1 tablet by mouth daily 03/30/23  Yes Black, Karin K, DO   ibuprofen  (ADVIL ;MOTRIN ) 800 MG tablet 1 tablet 02/15/23  Yes [provider]   atorvastatin  (LIPITOR ) 20 MG tablet Take 1 tablet by mouth daily 12/26/22  Yes Black, Karin K, DO   docusate sodium  (STOOL SOFTENER) 100 MG capsule Take 1 capsule by mouth as needed   Yes [provider]   b complex vitamins capsule Take 1 capsule by mouth daily   Yes [provider]   EQ 8HR ARTHRITIS PAIN RELIEF 650 MG extended release tablet  08/05/21  Yes [provider]   latanoprost  (XALATAN ) 0.005 % ophthalmic solution  08/09/21  Yes [provider]   CRANBERRY PO Take 1 capsule by mouth as needed   Yes [provider]   Cholecalciferol (VITAMIN D3) 50 MCG (2000 UT) CAPS Take 1 capsule by mouth  daily   Yes [provider]   CALCIUM  CARBONATE-VITAMIN D PO Take 600 mg by mouth 2 times daily Contains 600 mg Calcium  & 800 IU Vitamin D3.   Yes [provider]   Multiple Vitamins-Minerals (THERAPEUTIC MULTIVITAMIN-MINERALS) tablet Take 1 tablet by mouth daily   Yes [provider]   polyvinyl alcohol-povidone (HYPOTEARS) 1.4-0.6 % ophthalmic solution Place 1-2 drops into both eyes as needed   Yes [provider]   Handicap Placard MISC by Does not apply route Issue parking placard for person with disability; Applicant meets the qualifying disability criteria. Length of time expected to have disability__X___Lifetime. 05/03/23   Black, Karin K, DO   fluticasone  (ARNUITY ELLIPTA ) 100 MCG/ACT AEPB Inhale 1 puff into the lungs daily  Patient not taking: Reported on 10/13/2023 05/03/23   Black, Karin K, DO   ondansetron  (ZOFRAN ) 4 MG tablet Take 1 tablet by mouth every 8 hours as needed  Patient not taking: Reported on 10/13/2023 02/22/23   [provider]   Blood Pressure Monitoring (COMFORT TOUCH BP CUFF/MEDIUM) MISC Use daily to check blood pressure. 03/20/23   Black, Karin K, DO   omeprazole  (PRILOSEC) 40 MG delayed release capsule Take 1 capsule by mouth daily (with breakfast)  Patient not taking: Reported on 10/13/2023 03/20/23   Black, Karin K, DO   nitroGLYCERIN  (NITROSTAT ) 0.4 MG SL tablet Place 1 tablet under the tongue every 5 minutes as needed for Chest pain  Patient not taking: Reported on 10/13/2023 06/02/21   Lunette Almarie BRAVO, APRN - CNP   polyethylene glycol (GLYCOLAX ) 17 GM/SCOOP powder Take 17 g by mouth daily As needed.  Patient not taking: Reported on 10/13/2023    [provider]     Allergies:    Morphine, Pcn [penicillins], Zithromax [azithromycin], and Adhesive tape    Social History:   Social History     Socioeconomic History    Marital status: Widowed     Spouse name: None    Number of children: None    Years of education: None    Highest  education level: None   Tobacco Use    Smoking status: Former     Current packs/day: 0.00     Average packs/day: 1 pack/day  for 40.0 years (40.0 ttl pk-yrs)     Types: Cigarettes     Start date: 01/10/1953     Quit date: 01/11/1988     Years since quitting: 35.7     Passive exposure: Past    Smokeless tobacco: Never    Tobacco comments:     quit 25 years ago 1990   Vaping Use    Vaping status: Never Used   Substance and Sexual Activity    Alcohol use: No     Alcohol/week: 0.0 standard drinks of alcohol    Drug use: Never    Sexual activity: Not Currently     Social Drivers of Health     Financial Resource Strain: Low Risk  (05/18/2022)    Overall Financial Resource Strain (CARDIA)     Difficulty of Paying Living Expenses: Not hard at all   Food Insecurity: No Food Insecurity (03/20/2023)    Hunger Vital Sign     Worried About Running Out of Food in the Last Year: Never true     Ran Out of Food in the Last Year: Never true   Transportation Needs: No Transportation Needs (03/20/2023)    PRAPARE - Therapist, Art (Medical): No     Lack of Transportation (Non-Medical): No   Physical Activity: Inactive (12/27/2022)    Exercise Vital Sign     Days of Exercise per Week: 0 days     Minutes of Exercise per Session: 0 min   Housing Stability: Low Risk  (03/20/2023)    Housing Stability Vital Sign     Unable to Pay for Housing in the Last Year: No     Number of Times Moved in the Last Year: 0     Homeless in the Last Year: No     Family History:  Family History   Problem Relation Age of Onset    Other Mother         ALS - diagnosed at age 31    Stroke Father         age 44    Cancer Sister         pt thinks it was liver    Cancer Brother         leukemia    Diabetes Maternal Grandfather     Other Brother         drowned    Other Brother         died at age 74; think due to spinal meningitis    Lupus Sister     Alzheimer's Disease Sister     Dementia Sister     Mult Sclerosis Nephew     Mult Sclerosis Daughter       REVIEW OF SYSTEMS:  General: no fevers or chills  CV: no chest pain  Resp: no SOB  EXT/MSK: right lower back pain  ROS negative other than stated above    PHYSICAL EXAM:  BP (!) 143/79   Pulse 65   Temp 98.7 F (37.1 C) (Temporal)   Resp 17   Ht 1.626 m (5' 4)   Wt 56.2 kg (124 lb)   LMP  (LMP Unknown)   SpO2 93%   BMI 21.28 kg/m   Gen: alert and oriented, NAD, cooperative  Head: normocephalic atraumatic   Cardiovascular: Regular rate, no dependent edema, distal pulses 2+  Respiratory: Chest symmetric, no accessory muscle use, normal respirations  MSK:  Tenderness to palpation right SI; +Fortin finger  and +FABER  Motor 5/5 bilateral EHL, ankle DF and PF  Sensation intact to light touch L2-S1 bilaterally    A/P: 83 y.o. female being seen for right SI pain    - OR today for MIS right SI joint fusion  - NPO since MN  - ABx OCTOR  - AC held  - Consent signed, patient marked    Prentice JENEANE Level, DO  Orthopedic Surgery  9:31 AM 10/13/2023       "

## 2023-10-13 NOTE — Anesthesia Postprocedure Evaluation (Signed)
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    Aldrete Phase I: Aldrete Score: 10    Aldrete Phase II:      Anesthesia Post Evaluation    Patient location during evaluation: PACU  Patient participation: complete - patient participated  Level of consciousness: awake and alert  Airway patency: patent  Nausea & Vomiting: no nausea and no vomiting  Cardiovascular status: hemodynamically stable  Respiratory status: room air and spontaneous ventilation  Hydration status: euvolemic  Multimodal analgesia pain management approach  Pain management: adequate      No notable events documented.  "

## 2023-10-25 ENCOUNTER — Encounter

## 2023-10-25 NOTE — Telephone Encounter (Signed)
"    Katie Juarez called requesting a refill of the below medication which has been pended for you:     Requested Prescriptions     Pending Prescriptions Disp Refills    famotidine  (PEPCID ) 40 MG tablet [Pharmacy Med Name: Famotidine  40 MG Oral Tablet] 180 tablet 0     Sig: Take 1 tablet by mouth twice daily       Last Appointment Date: 07/03/2023  Next Appointment Date: 11/03/2023    Allergies   Allergen Reactions    Morphine Other (See Comments)     Night sweats,felt loopy.    Pcn [Penicillins] Hives    Zithromax [Azithromycin] Hives    Adhesive Tape Rash     "

## 2023-10-26 MED ORDER — FAMOTIDINE 40 MG PO TABS
40 | ORAL_TABLET | Freq: Two times a day (BID) | ORAL | 1 refills | Status: AC
Start: 2023-10-26 — End: ?

## 2023-11-02 ENCOUNTER — Encounter

## 2023-11-02 ENCOUNTER — Inpatient Hospital Stay: Admit: 2023-11-02 | Payer: MEDICARE | Primary: Family Medicine

## 2023-11-02 VITALS — BP 121/86 | HR 73 | Temp 98.00000°F | Resp 16 | Ht 64.02 in | Wt 124.0 lb

## 2023-11-02 DIAGNOSIS — M810 Age-related osteoporosis without current pathological fracture: Secondary | ICD-10-CM

## 2023-11-02 LAB — CBC
Hematocrit: 41.1 % (ref 36.3–47.1)
Hemoglobin: 13 g/dL (ref 11.9–15.1)
MCH: 32.2 pg (ref 25.2–33.5)
MCHC: 31.6 g/dL (ref 25.2–33.5)
MCV: 101.7 fL (ref 82.6–102.9)
MPV: 9.7 fL (ref 8.1–13.5)
NRBC Automated: 0 /100{WBCs}
Platelets: 247 k/uL (ref 138–453)
RBC: 4.04 m/uL (ref 3.95–5.11)
RDW: 13.9 % (ref 11.8–14.4)
WBC: 11.7 k/uL — ABNORMAL HIGH (ref 3.5–11.3)

## 2023-11-02 MED ORDER — DENOSUMAB 60 MG/ML SC SOSY
60 | Freq: Once | SUBCUTANEOUS | Status: AC
Start: 2023-11-02 — End: 2023-11-02
  Administered 2023-11-02: 15:00:00 60 mg via SUBCUTANEOUS

## 2023-11-02 MED FILL — PROLIA 60 MG/ML SC SOSY: 60 mg/mL | SUBCUTANEOUS | Qty: 1 | Fill #0

## 2023-11-02 NOTE — Progress Notes (Signed)
"  Patient came to the infusion center for her treatment. She had her blood draw and her results were within required range. Injection given. Tolerated well. Next appointment scheduled. Discharged home. Ambulated out of the dept with her daughter.   "

## 2023-11-03 ENCOUNTER — Ambulatory Visit: Admit: 2023-11-03 | Discharge: 2023-11-03 | Payer: MEDICARE | Attending: Family Medicine | Primary: Family Medicine

## 2023-11-03 ENCOUNTER — Inpatient Hospital Stay: Payer: MEDICARE | Primary: Family Medicine

## 2023-11-03 VITALS — BP 136/72 | HR 60 | Temp 97.60000°F | Resp 16 | Ht 64.02 in | Wt 124.4 lb

## 2023-11-03 DIAGNOSIS — S29011A Strain of muscle and tendon of front wall of thorax, initial encounter: Principal | ICD-10-CM

## 2023-11-03 DIAGNOSIS — R350 Frequency of micturition: Principal | ICD-10-CM

## 2023-11-03 LAB — URINALYSIS WITH REFLEX TO CULTURE
Bilirubin, Urine: NEGATIVE
Glucose, Ur: NEGATIVE mg/dL
Ketones, Urine: NEGATIVE mg/dL
Nitrite, Urine: POSITIVE — AB
Specific Gravity, UA: 1.025 (ref 1.010–1.025)
Urine Hgb: NEGATIVE
Urobilinogen, Urine: NORMAL EU/dL (ref 0.0–1.0)
pH, Urine: 5 (ref 5.0–6.0)

## 2023-11-03 LAB — MICROSCOPIC URINALYSIS
Epithelial Cells, UA: 2 /HPF (ref 0–5)
RBC, UA: 2 /HPF (ref 0–4)
WBC, UA: 2 /HPF (ref 0–4)

## 2023-11-03 MED ORDER — DONEPEZIL HCL 10 MG PO TABS
10 | ORAL_TABLET | Freq: Every evening | ORAL | 1 refills | 90.00000 days | Status: DC
Start: 2023-11-03 — End: 2023-11-30

## 2023-11-03 MED ORDER — LORAZEPAM 0.5 MG PO TABS
0.5 | ORAL_TABLET | Freq: Every evening | ORAL | 0 refills | 30.00000 days | Status: DC | PRN
Start: 2023-11-03 — End: 2024-02-06

## 2023-11-03 MED ORDER — INCRUSE ELLIPTA 62.5 MCG/ACT IN AEPB
62.5 | Freq: Every day | RESPIRATORY_TRACT | 3 refills | 30.00000 days | Status: AC
Start: 2023-11-03 — End: ?

## 2023-11-03 MED ORDER — ATORVASTATIN CALCIUM 20 MG PO TABS
20 | ORAL_TABLET | Freq: Every day | ORAL | 3 refills | 90.00000 days | Status: AC
Start: 2023-11-03 — End: ?

## 2023-11-03 MED ORDER — CITALOPRAM HYDROBROMIDE 20 MG PO TABS
20 | ORAL_TABLET | Freq: Every day | ORAL | 3 refills | 90.00000 days | Status: AC
Start: 2023-11-03 — End: ?

## 2023-11-03 MED ORDER — GABAPENTIN 100 MG PO CAPS
100 | ORAL_CAPSULE | Freq: Every evening | ORAL | 2 refills | 30.00000 days | Status: DC
Start: 2023-11-03 — End: 2023-12-27

## 2023-11-03 MED ORDER — ARNUITY ELLIPTA 100 MCG/ACT IN AEPB
100 | Freq: Every day | RESPIRATORY_TRACT | 3 refills | Status: AC
Start: 2023-11-03 — End: ?

## 2023-11-03 NOTE — Progress Notes (Signed)
"  After obtaining consent, and per orders of Dr. NETTA, injection of FLUAD given in Injection site: Right deltoidby CHRISTELLA HERTZ, RN. Patient instructed to remain in clinic for 15 minutes afterwards, and to report any adverse reaction immediately.    Documentation of Injection waste per [place]  guidelines:      Was there any injection waste during this visit?   YES OR NO: No      If yes,  Medication:   Amount Wasted:   Reason for Waste:   Disposal Method:     "

## 2023-11-03 NOTE — Progress Notes (Signed)
 "    [facility]  [address]  [place], [place] [address]  [phone]      Katie Juarez is a 83 y.o. female who presents today for her medical conditions/complaints as noted below.  Katie Juarez is c/o of f/u meds, Anxiety (F/u), F/u breathing, and Axilla pain (Left x1 mo)      HPI:     History of Present Illness  The patient is an 83 year old female who presents today for follow-up on breathing, anxiety, arm pain, and urinary symptoms.    She reports experiencing urinary incontinence, particularly when attempting to reach the bathroom in time. She also notes an increase in urinary frequency, which she attributes to her fluid intake. Her urine is described as bright yellow, but she reports no dysuria or hematuria. These symptoms have been present for more than a week. She has been using cranberry and D-Mannose supplements for some time but was not given them during her recent hospital stay for an SI joint fusion surgery. She did not have a Foley catheter inserted during her hospital stay.    She underwent a right-sided SI joint fusion surgery 3 weeks ago, with mixed results. She reports that the sensation of water  running down her leg has ceased, but her pain levels remain unchanged. She is scheduled to see Dr. Shirley next Tuesday, who plans to insert clips between her L2 and L3 vertebrae. She experienced significant bruising post-surgery and used a walker for 2 weeks before transitioning back to a cane.    She saw Dr. Rendell on 09/26/2023 for a retinal vein occlusion. She received her first injection into her eye to clear the blood up and is now seeing Dr. Mannie. She does not need to see Dr. Volanda anymore. She had a lot of improvement with just the one injection. She did not have pain, but it was just a gray area where it was like a cloud in her vision. It is noticeably better after the one injection on 10/11/2023. She was told it was probably an isolated incident and not  like a stroke. She remembers being in bed and having really bad eye pain. She will see Dr. Rendell again for her annual visit.    She has been experiencing left axillary pain for the past month, which is exacerbated by certain movements such as turning over in bed or using her arm. She reports no history of trauma to the area and no palpable lumps.    She has been taking famotidine  twice daily and has a sufficient supply at home.    She is currently taking atorvastatin , citalopram  once daily, Incruse and Arnuity inhalers, donepezil , Ativan  once nightly, and gabapentin  1 capsule nightly. She also takes Tylenol  Arthritis 650 mg twice daily and a muscle relaxer 2 to 3 times daily. She was administered tramadol  this morning due to severe pain, a medication she takes only when her pain levels reach 7 or 8. She has not taken tramadol  for the past 3 to 4 days. She has discontinued omeprazole .    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 She reports no history of ear trauma and no associated pain.    She received a Prolia  injection yesterday and subsequently felt unwell, similar to flu-like symptoms. However, she feels better today.    She reports no abdominal pain or swelling and normal bowel movements. She has been consuming dried  prunes to aid regularity. She reports no melena or hematochezia. Her appetite has decreased.    PAST SURGICAL HISTORY:  - Right-sided SI joint fusion surgery 3 weeks ago  - Retinal vein occlusion treatment with injection on 10/11/2023      Past Medical History:   Diagnosis Date    Arthritis     Basal cell carcinoma (BCC) of face 10/2018    excised by Dr Bethel     Breast cancer [facility]) 1999    in remission    Chronic pain     Constipation 12/27/2018    COPD (chronic obstructive pulmonary disease) (HCC)     COVID-19 11/06/2020    DDD (degenerative disc disease)     Diverticulitis 06/28/2018    GERD (gastroesophageal reflux disease)      Ileus (HCC) 08/07/2021    Large bowel obstruction (HCC) 02/2015    [place]    LBP (low back pain)     Osteoporosis     per DEXA 05/26/22    Pharyngoesophageal dysphagia     SS (spinal stenosis)       Past Surgical History:   Procedure Laterality Date    APPENDECTOMY  1956    BLADDER SUSPENSION  1990    BREAST LUMPECTOMY Right 1999    CARDIAC CATHETERIZATION  12/21/2020    Dr Glady    CHOLECYSTECTOMY  1990    EXCISION/BIOPSY Left 11/01/2018    Dr Bethel / [place], for [facility]    FEMORAL HERNIA REPAIR Left 02/20/2015    strangulated with small bowel resection - [place] ,  [facility], [id number]    HYSTERECTOMY (CERVIX STATUS UNKNOWN)  1980    Done for endometriosis; done in [place]    LUMBAR SPINE SURGERY  01/03/2019    VERTEBRAL AUMENTATION  L1      OTHER SURGICAL HISTORY  11/14/13, 06/23/11, 06/30/11, 07/12/11 12/29/11    L4/L5 IESI    OTHER SURGICAL HISTORY  12/17/2013    L5/S1 IESI    OTHER SURGICAL HISTORY Bilateral 05/30/2014    SIJ and piriformis    OTHER SURGICAL HISTORY Bilateral 09/16/2014    L3, L4, L5 Diagnostic Medial Branch Block    OTHER SURGICAL HISTORY Bilateral 09/26/2014    L5 TFE    OTHER SURGICAL HISTORY  10/10/2014    bil L5 TFE    OTHER SURGICAL HISTORY  10/31/2014    caudal    SMALL INTESTINE SURGERY  02/20/2015    strangulated left femoral hernia    SPINE SURGERY N/A 01/03/2019    VERTEBRAL AUMENTATION  L1  (C-ARM X2, performed by Debby KANDICE Mitts, MD at [facility] OR    SPINE SURGERY N/A 10/13/2023    SACROILIAC JOINT FUSION POSTERIOR WITH SI BONE performed by Lynnea Prentice PEDLAR, DO at [facility] OR    TONSILLECTOMY  1946    UPPER GASTROINTESTINAL ENDOSCOPY N/A 04/24/2019    EGD BIOPSY performed by Reyes DELENA Griffin, MD at [facility] OR     Family History   Problem Relation Age of Onset    Other Mother         ALS - diagnosed at age 66    Stroke Father         age 11    Cancer Sister         pt thinks it was liver    Cancer Brother         leukemia    Diabetes Maternal Grandfather      Other Brother  drowned    Other Brother         died at age 18; think due to spinal meningitis    Lupus Sister     Alzheimer's Disease Sister     Dementia Sister     Mult Sclerosis Nephew     Mult Sclerosis Daughter      Social History     Tobacco Use    Smoking status: Former     Current packs/day: 0.00     Average packs/day: 1 pack/day for 40.0 years (40.0 ttl pk-yrs)     Types: Cigarettes     Start date: 01/10/1953     Quit date: 01/11/1988     Years since quitting: 35.8     Passive exposure: Past    Smokeless tobacco: Never    Tobacco comments:     quit 25 years ago 1990   Substance Use Topics    Alcohol use: No     Alcohol/week: 0.0 standard drinks of alcohol      Current Outpatient Medications   Medication Sig Dispense Refill    atorvastatin  (LIPITOR ) 20 MG tablet Take 1 tablet by mouth daily 90 tablet 3    citalopram  (CELEXA ) 20 MG tablet Take 1 tablet by mouth daily 90 tablet 3    LORazepam  (ATIVAN ) 0.5 MG tablet Take 1 tablet by mouth nightly as needed for Anxiety for up to 90 days. Max Daily Amount: 0.5 mg 90 tablet 0    acetaminophen  (TYLENOL ) 325 MG tablet Take 4 tablets by mouth in the morning and at bedtime      fluticasone  (ARNUITY ELLIPTA ) 100 MCG/ACT AEPB Inhale 1 puff into the lungs daily 90 each 3    INCRUSE ELLIPTA  62.5 MCG/ACT inhaler Inhale 1 puff into the lungs daily 90 each 3    donepezil  (ARICEPT ) 10 MG tablet Take 1 tablet by mouth nightly 90 tablet 1    gabapentin  (NEURONTIN ) 100 MG capsule Take 1 capsule by mouth at bedtime for 90 days. 30 capsule 2    famotidine  (PEPCID ) 40 MG tablet Take 1 tablet by mouth 2 times daily 180 tablet 1    Handicap Placard MISC by Does not apply route Issue parking placard for person with disability; Applicant meets the qualifying disability criteria. Length of time expected to have disability__X___Lifetime. 1 each 0    docusate sodium  (STOOL SOFTENER) 100 MG capsule Take 1 capsule by mouth as needed      b complex vitamins capsule Take 1 capsule by  mouth daily      EQ 8HR ARTHRITIS PAIN RELIEF 650 MG extended release tablet       latanoprost  (XALATAN ) 0.005 % ophthalmic solution       nitroGLYCERIN  (NITROSTAT ) 0.4 MG SL tablet Place 1 tablet under the tongue every 5 minutes as needed for Chest pain 25 tablet 3    polyethylene glycol (GLYCOLAX ) 17 GM/SCOOP powder Take 17 g by mouth daily As needed.      CRANBERRY PO Take 1 capsule by mouth as needed      Cholecalciferol (VITAMIN D3) 50 MCG (2000 UT) CAPS Take 1 capsule by mouth daily      CALCIUM  CARBONATE-VITAMIN D PO Take 600 mg by mouth 2 times daily Contains 600 mg Calcium  & 800 IU Vitamin D3.      Multiple Vitamins-Minerals (THERAPEUTIC MULTIVITAMIN-MINERALS) tablet Take 1 tablet by mouth daily      polyvinyl alcohol-povidone (HYPOTEARS) 1.4-0.6 % ophthalmic solution Place 1-2 drops into both eyes as needed  cephALEXin  (KEFLEX ) 500 MG capsule Take 1 capsule by mouth 3 times daily for 7 days 21 capsule 0     No current facility-administered medications for this visit.     Allergies   Allergen Reactions    Morphine Other (See Comments)     Night sweats,felt loopy.    Pcn [Penicillins] Hives    Zithromax [Azithromycin] Hives    Adhesive Tape Rash       Health Maintenance   Topic Date Due    COVID-19 Vaccine (9 - 2024-25 season) 09/11/2023    Lipids  12/26/2023    Annual Wellness Visit (Medicare)  12/28/2023    Depression Screen  03/19/2024    DTaP/Tdap/Td vaccine (2 - Td or Tdap) 09/28/2027    DEXA (modify frequency per FRAX score)  Completed    Flu vaccine  Completed    Shingles vaccine  Completed    Pneumococcal 50+ years Vaccine  Completed    Respiratory Syncytial Virus (RSV) Pregnant or age 47 yrs+  Completed    Hepatitis A vaccine  Aged Out    Hepatitis B vaccine  Aged Out    Hib vaccine  Aged Out    Polio vaccine  Aged Out    Meningococcal (ACWY) vaccine  Aged Out    Meningococcal B vaccine  Aged Out       Subjective:      Review of Systems   Genitourinary:  Positive for frequency (intermittent).  Negative for dysuria and hematuria.       Objective:     Vitals:    11/03/23 1136   BP: 136/72   BP Site: Right Upper Arm   Patient Position: Sitting   Pulse: 60   Resp: 16   Temp: 97.6 F (36.4 C)   TempSrc: Temporal   SpO2: 94%   Weight: 56.4 kg (124 lb 6.4 oz)   Height: 1.626 m (5' 4.02)     Physical Exam  Vitals and nursing note reviewed.   Constitutional:       General: She is not in acute distress.     Appearance: Normal appearance. She is well-developed.   HENT:      Head: Normocephalic and atraumatic.      Right Ear: Tympanic membrane, ear canal and external ear normal.      Left Ear: Tympanic membrane, ear canal and external ear normal.      Nose: Nose normal.      Mouth/Throat:      Mouth: Mucous membranes are moist.      Pharynx: Oropharynx is clear. No posterior oropharyngeal erythema.   Eyes:      Conjunctiva/sclera: Conjunctivae normal.   Cardiovascular:      Rate and Rhythm: Normal rate and regular rhythm.      Heart sounds: Normal heart sounds.   Pulmonary:      Effort: Pulmonary effort is normal. No respiratory distress.      Breath sounds: Normal breath sounds.   Abdominal:      General: Bowel sounds are normal. There is no distension.      Palpations: Abdomen is soft.      Tenderness: There is no abdominal tenderness.   Musculoskeletal:      Cervical back: Neck supple.   Skin:     General: Skin is warm and dry.   Neurological:      General: No focal deficit present.      Mental Status: She is alert and oriented to person, place, and time.  Psychiatric:         Mood and Affect: Mood normal.         Assessment:      1. Pectoralis muscle strain, initial encounter  2. Hyperlipidemia, unspecified hyperlipidemia type  -     atorvastatin  (LIPITOR ) 20 MG tablet; Take 1 tablet by mouth daily, Disp-90 tablet, R-3Normal  -     Lipid Panel; Future  3. Urinary frequency  -     Urinalysis with Reflex to Culture; Future  -     Culture, Urine; Future  4. Urinary urgency  -     Urinalysis with Reflex to  Culture; Future  -     Culture, Urine; Future  5. Anxiety  -     citalopram  (CELEXA ) 20 MG tablet; Take 1 tablet by mouth daily, Disp-90 tablet, R-3Normal  -     LORazepam  (ATIVAN ) 0.5 MG tablet; Take 1 tablet by mouth nightly as needed for Anxiety for up to 90 days. Max Daily Amount: 0.5 mg, Disp-90 tablet, R-0Normal  6. Acute right-sided low back pain without sciatica  -     gabapentin  (NEURONTIN ) 100 MG capsule; Take 1 capsule by mouth at bedtime for 90 days., Disp-30 capsule, R-2Normal  7. Chronic obstructive pulmonary disease, unspecified COPD type (HCC)  -     fluticasone  (ARNUITY ELLIPTA ) 100 MCG/ACT AEPB; Inhale 1 puff into the lungs daily, Disp-90 each, R-3Normal  -     INCRUSE ELLIPTA  62.5 MCG/ACT inhaler; Inhale 1 puff into the lungs daily, Disp-90 each, R-3, DAWNormal  8. Restrictive lung disease  -     INCRUSE ELLIPTA  62.5 MCG/ACT inhaler; Inhale 1 puff into the lungs daily, Disp-90 each, R-3, DAWNormal  9. Memory problem  -     donepezil  (ARICEPT ) 10 MG tablet; Take 1 tablet by mouth nightly, Disp-90 tablet, R-1Normal  10. Need for influenza vaccination  -     Influenza, FLUAD Trivalent, (age 71 y+), IM, Preservative Free, 0.5mL         Plan:      Assessment & Plan  1. Urinary symptoms:  - Her urinary symptoms may be attributed to a potential infection or a weakening of the bladder sphincter. The frequency of urination could be influenced by dietary factors.  - A urine test will be conducted today, and the results will be communicated to her. If an infection is detected, an antibiotic regimen will be initiated.    2. Pectoral strain:  - The pain in her left axilla is likely due to a strain in the pectoral muscle.  - She is advised to apply heat or take Tylenol  for relief.    3. Anxiety:  - She is advised to continue taking citalopram  once daily for anxiety management. A prescription for citalopram  will be provided.    4. Gastroesophageal reflux disease:  - She is advised to reduce her famotidine  dosage  to 20 mg twice daily. If she experiences increased heartburn, belching, nausea, or bloating, she can revert to the full dose.    5. Medication management:  - She is advised to rinse her mouth or brush her teeth after using the Arnuity inhaler. Prescriptions for atorvastatin , Incruse, Arnuity, donepezil , and Ativan  will be provided.    6. Earlobe cysts:  - The cysts on her earlobes are not medically concerning.  - A referral to dermatology will be made for further evaluation.    7. Health maintenance:  - She received a Prolia  injection yesterday and experienced flu-like symptoms, which  have since resolved.    8. Constipation:  - She has been consuming dried prunes to aid regularity.        Return in about 8 weeks (around 12/27/2023) for f/u meds, breathing, Pepcid , Gabapentin .    Orders Placed This Encounter   Procedures    Culture, Urine     Standing Status:   Future     Number of Occurrences:   1     Expected Date:   11/03/2023     Expiration Date:   11/02/2024    Influenza, FLUAD Trivalent, (age 39 y+), IM, Preservative Free, 0.69mL    Urinalysis with Reflex to Culture     Standing Status:   Future     Number of Occurrences:   1     Expected Date:   11/03/2023     Expiration Date:   11/02/2024     SPECIFY(EX-CATH,MIDSTREAM,CYSTO,ETC)?:   mid stream    Lipid Panel     Standing Status:   Future     Expected Date:   12/25/2023     Expiration Date:   11/02/2024     Orders Placed This Encounter   Medications    atorvastatin  (LIPITOR ) 20 MG tablet     Sig: Take 1 tablet by mouth daily     Dispense:  90 tablet     Refill:  3    citalopram  (CELEXA ) 20 MG tablet     Sig: Take 1 tablet by mouth daily     Dispense:  90 tablet     Refill:  3    LORazepam  (ATIVAN ) 0.5 MG tablet     Sig: Take 1 tablet by mouth nightly as needed for Anxiety for up to 90 days. Max Daily Amount: 0.5 mg     Dispense:  90 tablet     Refill:  0    fluticasone  (ARNUITY ELLIPTA ) 100 MCG/ACT AEPB     Sig: Inhale 1 puff into the lungs daily     Dispense:   90 each     Refill:  3    INCRUSE ELLIPTA  62.5 MCG/ACT inhaler     Sig: Inhale 1 puff into the lungs daily     Dispense:  90 each     Refill:  3    donepezil  (ARICEPT ) 10 MG tablet     Sig: Take 1 tablet by mouth nightly     Dispense:  90 tablet     Refill:  1    gabapentin  (NEURONTIN ) 100 MG capsule     Sig: Take 1 capsule by mouth at bedtime for 90 days.     Dispense:  30 capsule     Refill:  2           Discussed use, benefit, and side effects of prescribed medications.  All patient questions answered.  Pt voiced understanding.  Reviewed health maintenance.    The patient (or guardian, if applicable) and other individuals in attendance with the patient were advised that Artificial Intelligence will be utilized during this visit to record, process the conversation to generate a clinical note, and support improvement of the AI technology. The patient (or guardian, if applicable) and other individuals in attendance at the appointment consented to the use of AI, including the recording.                Electronically signed by Wonda MARLA Ka, DO, DO on 11/10/2023 at 11:20 AM             "

## 2023-11-05 LAB — CULTURE, URINE

## 2023-11-07 ENCOUNTER — Encounter

## 2023-11-07 MED ORDER — CEPHALEXIN 500 MG PO CAPS
500 | ORAL_CAPSULE | Freq: Three times a day (TID) | ORAL | 0 refills | 7.00000 days | Status: DC
Start: 2023-11-07 — End: 2024-01-09

## 2023-11-16 ENCOUNTER — Encounter

## 2023-11-16 NOTE — Telephone Encounter (Signed)
"  90 day supply dispensed 11/03/23  "

## 2023-11-21 ENCOUNTER — Inpatient Hospital Stay: Admit: 2023-11-21 | Payer: MEDICARE | Primary: Family Medicine

## 2023-11-21 NOTE — Discharge Instructions (Signed)
"  DAY OF SURGERY/PROCEDURE  GUIDELINES    As a patient at [facility], you can expect quality medical and nursing care that is centered on your individual needs. It is our goal to make your surgical experience as comfortable and excellent as possible. Please call us  here at [facility] Pre-Admission testing with any questions [phone].  ________________________________________________________________________    The following instructions are general guidelines, if any information on this sheet is different from what your doctor has instructed you to do, please follow your doctor's instructions.    Please arrive on 11/20 @ 9:30 am       Enter through c entrance. Check in at registration     Upon arrival you will be taken to the pre-operative area to get ready for surgery, your family will stay in the waiting room and visit with you once you are ready for surgery. Due to special limitations please limit visitation to 1-2 members of your family at a time. When it is time for surgery your family will return to the waiting room.    Staying hydrated is important, you may drink sips of water  up to 2 hours prior to your arrival time at the hospital.     Please stop eating all foods, and all other beverages or drinks after midnight prior to your surgery/procedure.    Do not smoke, suck or chew after midnight (no gum, mints, candy, cigarettes, cigars, pipes, snuff, chewing tobacco, vaping, marijuana, etc.) or your surgery may be canceled.     IN CASE OF ILLNESS - If you have a cold or flu symptoms (high fever, runny nose, sore throat, cough, etc.) rash, nausea, vomiting, loose stools, and/or recent contact with someone who has a contagious disease (chick pox, measles, etc.) please call your doctor before coming to the surgery center    Take a small sip of water  with omeprazole  and famotidine  the morning of your surgery. Please take inhaler in am.    Please refer to your surgeon's instructions regarding  holding any over the counter vitamins, herbals, supplements, etc. prior to your upcoming surgery.    If applicable bring your:  Inhaler (s)  Hearing aid(s)  Eyeglasses and Case (If you wear contacts they have to be removed before surgery, bring case and solution)    DO NOT take anticoagulants (blood thinners, ibuprofen , Motrin , NSAIDS, aspirin  or aspirin -containing products) as instructed by your physician.    Take a shower or bath the night prior to and on the morning of your surgery/procedure (Hibiclens if directed) Do not apply any lotions.    Brush your teeth, but do not swallow any water     Leave all jewelry at home and wear loose, comfortable clothing that is easy to put on and take off.     Do not wear makeup or nail polish and remove all body piercing's prior to arrival to the hospital.    If you will be returning home the same day as your surgery, you will need to have a responsible adult (29 years of age or older) present to drive you home or accompany you if you are taking a taxi. You will need someone to stay at the hospital during your surgery and with you at home for the first 24 hours following your surgery. This is due to the anesthesia and the medication given to you during surgery and recovery.          "

## 2023-11-24 ENCOUNTER — Inpatient Hospital Stay: Admit: 2023-11-24 | Payer: MEDICARE | Primary: Family Medicine

## 2023-11-24 ENCOUNTER — Ambulatory Visit: Payer: MEDICARE | Primary: Family Medicine

## 2023-11-24 ENCOUNTER — Inpatient Hospital Stay: Payer: MEDICARE | Primary: Family Medicine

## 2023-11-24 DIAGNOSIS — N390 Urinary tract infection, site not specified: Principal | ICD-10-CM

## 2023-11-24 DIAGNOSIS — Z87440 Personal history of urinary (tract) infections: Secondary | ICD-10-CM

## 2023-11-24 LAB — URINALYSIS WITH REFLEX TO CULTURE
Bilirubin, Urine: NEGATIVE
Glucose, Ur: NEGATIVE mg/dL
Ketones, Urine: NEGATIVE mg/dL
Leukocyte Esterase, Urine: NEGATIVE
Nitrite, Urine: NEGATIVE
Specific Gravity, UA: 1.03 — ABNORMAL HIGH (ref 1.010–1.025)
Urine Hgb: NEGATIVE
Urobilinogen, Urine: NORMAL EU/dL (ref 0.0–1.0)
pH, Urine: 5.5 (ref 5.0–6.0)

## 2023-11-24 LAB — MICROSCOPIC URINALYSIS
Epithelial Cells, UA: 0 /HPF (ref 0–5)
RBC, UA: 0 /HPF (ref 0–4)
WBC, UA: 2 /HPF (ref 0–4)

## 2023-11-25 NOTE — Progress Notes (Signed)
"  Patient not seen in the walk in  "

## 2023-11-27 ENCOUNTER — Ambulatory Visit: Payer: MEDICARE | Attending: Internal Medicine | Primary: Family Medicine

## 2023-11-27 ENCOUNTER — Encounter

## 2023-11-27 LAB — CULTURE, URINE

## 2023-11-27 MED ORDER — SULFAMETHOXAZOLE-TRIMETHOPRIM 800-160 MG PO TABS
800-160 | ORAL_TABLET | Freq: Two times a day (BID) | ORAL | 0 refills | 7.00000 days | Status: AC
Start: 2023-11-27 — End: 2023-12-02

## 2023-11-27 NOTE — Telephone Encounter (Signed)
"  Katie Juarez calling back, she spoke with Dr Markham office and they are okay with pt to be on the antibiotic and ok to proceed with procedure.  "

## 2023-11-27 NOTE — Telephone Encounter (Signed)
"  Spoke with Katie Juarez, she is made aware of results. She states Dr Ardie office is not aware of the infection so she will have to call them to see what this will mea for her procedure. She is wanting something sent in for Katie Juarez to [employer] and will call us  later this afternoon with what Dr Markham has to say. Please advise.  "

## 2023-11-30 ENCOUNTER — Inpatient Hospital Stay: Payer: MEDICARE | Attending: Orthopaedic Surgery

## 2023-11-30 ENCOUNTER — Ambulatory Visit: Admit: 2023-11-30 | Payer: MEDICARE | Primary: Family Medicine

## 2023-11-30 MED ORDER — DONEPEZIL HCL 10 MG PO TABS
10 | ORAL_TABLET | Freq: Every evening | ORAL | 0 refills | 70.00000 days | Status: DC
Start: 2023-11-30 — End: 2023-12-14

## 2023-11-30 MED ORDER — LIDOCAINE HCL 1 % IJ SOLN
1 | Freq: Once | INTRAMUSCULAR | Status: DC | PRN
Start: 2023-11-30 — End: 2023-11-30

## 2023-11-30 MED ORDER — ACETAMINOPHEN 325 MG PO TABS
325 | ORAL_TABLET | Freq: Four times a day (QID) | ORAL | 0 refills | 10.50000 days | Status: AC | PRN
Start: 2023-11-30 — End: 2023-12-27
  Filled 2023-11-30: qty 42, 7d supply, fill #0

## 2023-11-30 MED ORDER — DIPHENHYDRAMINE HCL 50 MG/ML IJ SOLN
50 | Freq: Once | INTRAMUSCULAR | Status: DC | PRN
Start: 2023-11-30 — End: 2023-11-30

## 2023-11-30 MED ORDER — NORMAL SALINE FLUSH 0.9 % IV SOLN
0.9 | Freq: Two times a day (BID) | INTRAVENOUS | Status: DC
Start: 2023-11-30 — End: 2023-11-30

## 2023-11-30 MED ORDER — SODIUM CHLORIDE 0.9 % IV SOLN
0.9 | INTRAVENOUS | Status: DC | PRN
Start: 2023-11-30 — End: 2023-11-30

## 2023-11-30 MED ORDER — CEFAZOLIN 2000 MG IN 20 ML SWFI IV SYRINGE (PREMIX)
Status: AC
Start: 2023-11-30 — End: 2023-11-30

## 2023-11-30 MED ORDER — LIDOCAINE HCL (PF) 1 % IJ SOLN
1 | Freq: Once | INTRAMUSCULAR | Status: DC | PRN
Start: 2023-11-30 — End: 2023-11-30
  Administered 2023-11-30: 17:00:00 50 via INTRAVENOUS

## 2023-11-30 MED ORDER — BUPIVACAINE-EPINEPHRINE (PF) 0.25% -1:200000 IJ SOLN
INTRAMUSCULAR | Status: DC | PRN
Start: 2023-11-30 — End: 2023-11-30
  Administered 2023-11-30: 18:00:00 10 via INTRAMUSCULAR

## 2023-11-30 MED ORDER — ROCURONIUM BROMIDE 50 MG/5ML IV SOLN
50 | Freq: Once | INTRAVENOUS | Status: DC | PRN
Start: 2023-11-30 — End: 2023-11-30
  Administered 2023-11-30: 17:00:00 50 via INTRAVENOUS
  Administered 2023-11-30: 18:00:00 20 via INTRAVENOUS

## 2023-11-30 MED ORDER — IPRATROPIUM-ALBUTEROL 0.5-2.5 (3) MG/3ML IN SOLN
0.5-2.5 | Freq: Once | RESPIRATORY_TRACT | Status: DC | PRN
Start: 2023-11-30 — End: 2023-11-30

## 2023-11-30 MED ORDER — DOCUSATE SODIUM 100 MG PO CAPS
100 | ORAL_CAPSULE | Freq: Two times a day (BID) | ORAL | 0 refills | Status: AC | PRN
Start: 2023-11-30 — End: ?
  Filled 2023-11-30: qty 20, 10d supply, fill #0

## 2023-11-30 MED ORDER — PHENYLEPHRINE HCL 1 MG/10ML IV SOSY
1 | Freq: Once | INTRAVENOUS | Status: DC | PRN
Start: 2023-11-30 — End: 2023-11-30
  Administered 2023-11-30: 18:00:00 100 via INTRAVENOUS

## 2023-11-30 MED ORDER — LIDOCAINE HCL (PF) 1 % IJ SOLN
1 | INTRAMUSCULAR | Status: AC
Start: 2023-11-30 — End: 2023-11-30

## 2023-11-30 MED ORDER — GLYCOPYRROLATE 1 MG/5ML IJ SOLN
1 | INTRAMUSCULAR | Status: AC
Start: 2023-11-30 — End: 2023-11-30

## 2023-11-30 MED ORDER — PROPOFOL 200 MG/20ML IV EMUL
200 | INTRAVENOUS | Status: AC
Start: 2023-11-30 — End: 2023-11-30

## 2023-11-30 MED ORDER — MEPERIDINE HCL 50 MG/ML IJ SOLN
50 | INTRAMUSCULAR | Status: DC | PRN
Start: 2023-11-30 — End: 2023-11-30

## 2023-11-30 MED ORDER — CEFAZOLIN 2000 MG IN 20 ML SWFI IV SYRINGE (PREMIX)
Freq: Once | Status: DC | PRN
Start: 2023-11-30 — End: 2023-11-30
  Administered 2023-11-30: 18:00:00 2 via INTRAVENOUS

## 2023-11-30 MED ORDER — TIZANIDINE HCL 4 MG PO TABS
4 | ORAL_TABLET | Freq: Four times a day (QID) | ORAL | 0 refills | Status: AC | PRN
Start: 2023-11-30 — End: 2023-12-14
  Filled 2023-11-30: qty 56, 14d supply, fill #0

## 2023-11-30 MED ORDER — OXYCODONE-ACETAMINOPHEN 5-325 MG PO TABS
5-325 | Freq: Once | ORAL | Status: AC | PRN
Start: 2023-11-30 — End: 2023-11-30
  Administered 2023-11-30: 19:00:00 1 via ORAL

## 2023-11-30 MED ORDER — THROMBIN 20000 UNITS EX SOLR
20000 | CUTANEOUS | Status: DC | PRN
Start: 2023-11-30 — End: 2023-11-30
  Administered 2023-11-30: 18:00:00 10000 via TOPICAL

## 2023-11-30 MED ORDER — PHENYLEPHRINE HCL 1 MG/10ML IV SOSY
1 | INTRAVENOUS | Status: AC
Start: 2023-11-30 — End: 2023-11-30

## 2023-11-30 MED ORDER — DEXAMETHASONE SOD PHOSPHATE PF 10 MG/ML IJ SOLN
10 | INTRAMUSCULAR | Status: AC
Start: 2023-11-30 — End: 2023-11-30

## 2023-11-30 MED ORDER — SUGAMMADEX SODIUM 200 MG/2ML IV SOLN
200 | Freq: Once | INTRAVENOUS | Status: DC | PRN
Start: 2023-11-30 — End: 2023-11-30
  Administered 2023-11-30: 19:00:00 200 via INTRAVENOUS

## 2023-11-30 MED ORDER — GLYCOPYRROLATE 1 MG/5ML IJ SOLN
1 | Freq: Once | INTRAMUSCULAR | Status: DC | PRN
Start: 2023-11-30 — End: 2023-11-30
  Administered 2023-11-30: 18:00:00 .4 via INTRAVENOUS

## 2023-11-30 MED ORDER — SUGAMMADEX SODIUM 200 MG/2ML IV SOLN
200 | INTRAVENOUS | Status: AC
Start: 2023-11-30 — End: 2023-11-30

## 2023-11-30 MED ORDER — NORMAL SALINE FLUSH 0.9 % IV SOLN
0.9 | INTRAVENOUS | Status: DC | PRN
Start: 2023-11-30 — End: 2023-11-30

## 2023-11-30 MED ORDER — ONDANSETRON HCL 4 MG/2ML IJ SOLN
4 | INTRAMUSCULAR | Status: AC
Start: 2023-11-30 — End: 2023-11-30

## 2023-11-30 MED ORDER — METOCLOPRAMIDE HCL 5 MG/ML IJ SOLN
5 | Freq: Once | INTRAMUSCULAR | Status: DC | PRN
Start: 2023-11-30 — End: 2023-11-30

## 2023-11-30 MED ORDER — HYDROMORPHONE HCL 1 MG/ML IJ SOLN
1 | INTRAMUSCULAR | Status: DC | PRN
Start: 2023-11-30 — End: 2023-11-30

## 2023-11-30 MED ORDER — MIDAZOLAM HCL (PF) 2 MG/2ML IJ SOLN
2 | Freq: Once | INTRAMUSCULAR | Status: DC | PRN
Start: 2023-11-30 — End: 2023-11-30

## 2023-11-30 MED ORDER — ROCURONIUM BROMIDE 50 MG/5ML IV SOLN
50 | INTRAVENOUS | Status: AC
Start: 2023-11-30 — End: 2023-11-30

## 2023-11-30 MED ORDER — LABETALOL HCL 5 MG/ML IV SOLN
5 | INTRAVENOUS | Status: DC | PRN
Start: 2023-11-30 — End: 2023-11-30

## 2023-11-30 MED ORDER — ONDANSETRON HCL 4 MG/2ML IJ SOLN
4 | Freq: Once | INTRAMUSCULAR | Status: DC | PRN
Start: 2023-11-30 — End: 2023-11-30

## 2023-11-30 MED ORDER — FENTANYL CITRATE (PF) 100 MCG/2ML IJ SOLN
100 | INTRAMUSCULAR | Status: AC
Start: 2023-11-30 — End: 2023-11-30

## 2023-11-30 MED ORDER — DEXAMETHASONE SOD PHOSPHATE PF 10 MG/ML IJ SOLN
10 | Freq: Once | INTRAMUSCULAR | Status: DC | PRN
Start: 2023-11-30 — End: 2023-11-30
  Administered 2023-11-30: 17:00:00 8 via INTRAVENOUS

## 2023-11-30 MED ORDER — PROPOFOL 200 MG/20ML IV EMUL
200 | Freq: Once | INTRAVENOUS | Status: DC | PRN
Start: 2023-11-30 — End: 2023-11-30
  Administered 2023-11-30: 17:00:00 150 via INTRAVENOUS

## 2023-11-30 MED ORDER — THROMBIN 5000 UNITS EX SOLR
5000 | CUTANEOUS | Status: DC
Start: 2023-11-30 — End: 2023-11-30

## 2023-11-30 MED ORDER — LACTATED RINGERS IV SOLN
INTRAVENOUS | Status: DC
Start: 2023-11-30 — End: 2023-11-30
  Administered 2023-11-30 (×2): via INTRAVENOUS

## 2023-11-30 MED ORDER — OXYCODONE-ACETAMINOPHEN 5-325 MG PO TABS
5-325 | ORAL_TABLET | ORAL | 0 refills | Status: AC | PRN
Start: 2023-11-30 — End: 2023-12-07

## 2023-11-30 MED ORDER — HYDROMORPHONE HCL 1 MG/ML IJ SOLN
1 | INTRAMUSCULAR | Status: AC
Start: 2023-11-30 — End: 2023-11-30

## 2023-11-30 MED ORDER — ONDANSETRON HCL 4 MG/2ML IJ SOLN
4 | Freq: Once | INTRAMUSCULAR | Status: DC | PRN
Start: 2023-11-30 — End: 2023-11-30
  Administered 2023-11-30: 19:00:00 4 via INTRAVENOUS

## 2023-11-30 MED ORDER — BUPIVACAINE-EPINEPHRINE (PF) 0.25% -1:200000 IJ SOLN
INTRAMUSCULAR | Status: DC
Start: 2023-11-30 — End: 2023-11-30

## 2023-11-30 MED ORDER — NALOXONE HCL 0.4 MG/ML IJ SOLN
0.4 | INTRAMUSCULAR | Status: DC | PRN
Start: 2023-11-30 — End: 2023-11-30

## 2023-11-30 MED ORDER — GELATIN ABSORBABLE 100 EX MISC
100 | CUTANEOUS | Status: DC | PRN
Start: 2023-11-30 — End: 2023-11-30
  Administered 2023-11-30: 18:00:00 1 via TOPICAL

## 2023-11-30 MED ORDER — FENTANYL CITRATE (PF) 100 MCG/2ML IJ SOLN
100 | Freq: Once | INTRAMUSCULAR | Status: DC | PRN
Start: 2023-11-30 — End: 2023-11-30
  Administered 2023-11-30 (×2): 50 via INTRAVENOUS

## 2023-11-30 MED ORDER — HYDRALAZINE HCL 20 MG/ML IJ SOLN
20 | INTRAMUSCULAR | Status: DC | PRN
Start: 2023-11-30 — End: 2023-11-30

## 2023-11-30 MED ORDER — GELATIN ABSORBABLE 100 EX MISC
100 | CUTANEOUS | Status: DC
Start: 2023-11-30 — End: 2023-11-30

## 2023-11-30 MED ORDER — OXYCODONE-ACETAMINOPHEN 5-325 MG PO TABS
5-325 | Freq: Once | ORAL | Status: DC | PRN
Start: 2023-11-30 — End: 2023-11-30

## 2023-11-30 MED ORDER — GLYCOPYRROLATE 1 MG/5ML IJ SOLN
1 | Freq: Once | INTRAMUSCULAR | Status: DC
Start: 2023-11-30 — End: 2023-11-30

## 2023-11-30 MED ORDER — KETOROLAC TROMETHAMINE 30 MG/ML IJ SOLN
30 | Freq: Once | INTRAMUSCULAR | Status: DC | PRN
Start: 2023-11-30 — End: 2023-11-30
  Administered 2023-11-30: 19:00:00 15 via INTRAVENOUS

## 2023-11-30 MED ORDER — KETOROLAC TROMETHAMINE 30 MG/ML IJ SOLN
30 | INTRAMUSCULAR | Status: AC
Start: 2023-11-30 — End: 2023-11-30

## 2023-11-30 MED ORDER — MAGNESIUM SULFATE IN D5W 1-5 GM/100ML-% IV SOLN
1-5 | INTRAVENOUS | Status: DC
Start: 2023-11-30 — End: 2023-11-30

## 2023-11-30 MED ORDER — HYDROMORPHONE HCL 1 MG/ML IJ SOLN
1 | Freq: Once | INTRAMUSCULAR | Status: DC | PRN
Start: 2023-11-30 — End: 2023-11-30
  Administered 2023-11-30: 19:00:00 .5 via INTRAVENOUS

## 2023-11-30 MED FILL — FENTANYL CITRATE (PF) 100 MCG/2ML IJ SOLN: 100 MCG/2ML | INTRAMUSCULAR | Qty: 2

## 2023-11-30 MED FILL — DEXAMETHASONE SOD PHOSPHATE PF 10 MG/ML IJ SOLN: 10 mg/mL | INTRAMUSCULAR | Qty: 1

## 2023-11-30 MED FILL — ONDANSETRON HCL 4 MG/2ML IJ SOLN: 4 MG/2ML | INTRAMUSCULAR | Qty: 2

## 2023-11-30 MED FILL — ROCURONIUM BROMIDE 50 MG/5ML IV SOLN: 50 MG/5ML | INTRAVENOUS | Qty: 5

## 2023-11-30 MED FILL — DIPRIVAN 200 MG/20ML IV EMUL: 200 MG/20ML | INTRAVENOUS | Qty: 20

## 2023-11-30 MED FILL — BRIDION 200 MG/2ML IV SOLN: 200 MG/2ML | INTRAVENOUS | Qty: 2

## 2023-11-30 MED FILL — DILAUDID 1 MG/ML IJ SOLN: 1 mg/mL | INTRAMUSCULAR | Qty: 0.5

## 2023-11-30 MED FILL — PHENYLEPHRINE HCL (PRESSORS) 1 MG/10ML IV SOSY: 1 MG/0ML | INTRAVENOUS | Qty: 10

## 2023-11-30 MED FILL — CEFAZOLIN 2000 MG IN 20 ML SWFI IV SYRINGE (PREMIX): Qty: 2000

## 2023-11-30 MED FILL — KETOROLAC TROMETHAMINE 30 MG/ML IJ SOLN: 30 mg/mL | INTRAMUSCULAR | Qty: 1

## 2023-11-30 MED FILL — THROMBIN-JMI 5000 UNITS EX SOLR: 5000 [IU] | CUTANEOUS | Qty: 5000

## 2023-11-30 MED FILL — SENSORCAINE-MPF/EPINEPHRINE 0.25% -1:200000 IJ SOLN: INTRAMUSCULAR | Qty: 30

## 2023-11-30 MED FILL — OXYCODONE-ACETAMINOPHEN 5-325 MG PO TABS: 5-325 mg | ORAL | Qty: 1

## 2023-11-30 MED FILL — LIDOCAINE HCL (PF) 1 % IJ SOLN: 1 % | INTRAMUSCULAR | Qty: 5

## 2023-11-30 MED FILL — SURGIFOAM 100 EX MISC: 100 | CUTANEOUS | Qty: 1

## 2023-11-30 MED FILL — MAGNESIUM SULFATE IN D5W 1-5 GM/100ML-% IV SOLN: 1-5 GM/100ML-% | INTRAVENOUS | Qty: 200

## 2023-11-30 MED FILL — GLYCOPYRROLATE 1 MG/5ML IJ SOLN: 1 MG/5ML | INTRAMUSCULAR | Qty: 5

## 2023-11-30 MED FILL — OXYCODONE-ACETAMINOPHEN 5-325 TABS: 5-325 5-325 MG | ORAL | 7 days supply | Qty: 42 | Fill #0 | Status: AC

## 2023-11-30 NOTE — Anesthesia Postprocedure Evaluation (Signed)
"  Department of Anesthesiology  Postprocedure Note    Patient: Katie Juarez  MRN: [id number]  Birthdate: 1940-06-18  Date of evaluation: 11/30/2023    Procedure Summary       Date: 11/30/23 Room / Location: [facility] OR 03 / [facility]    Anesthesia Start: 1204 Anesthesia Stop: 1401    Procedure: L3-4 LUMBAR LAMINECTOMY DECOMPRESSION POSTERIOR Diagnosis:       Spinal stenosis of lumbar region without neurogenic claudication      (Spinal stenosis of lumbar region without neurogenic claudication [M48.061])    Surgeons: [name], MD Responsible Provider: Landa Lakes, MD    Anesthesia Type: general ASA Status: 3            Anesthesia Type: No value filed.    Aldrete Phase I: Aldrete Score: 10    Aldrete Phase II:      Anesthesia Post Evaluation    Patient location during evaluation: PACU  Patient participation: complete - patient participated  Level of consciousness: awake and alert  Airway patency: patent  Nausea & Vomiting: no nausea and no vomiting  Cardiovascular status: hemodynamically stable  Respiratory status: room air and spontaneous ventilation  Hydration status: euvolemic  Multimodal analgesia pain management approach  Pain management: adequate      No notable events documented.  "

## 2023-11-30 NOTE — Progress Notes (Signed)
"  Spiritual Health Progress Note  [facility]       Room # STVZP OR POOL RM/NONE    Name: Katie Juarez           Age: 83 y.o.    Gender: female          MRN: [id number]  Religion: Catholic       Preferred Language: English      Date: 11/30/23  Visit Time: Begin Time: (P) 1425 End Time : (P) 1440  Complexity of Encounter: (P) High      Visit Summary: Chaplain visited Pt. In OR pre op. Pt. Was anxious and family was present during visit. Family was open to conversation and friendly. Chaplain provided support and pastoral care. Empathic listening and comfort was provided.  Chaplain offered  strength and calmness. Will follow up.    Referral/Consult From: (P) Rounding  Encounter Overview/Reason: (P) Spiritual/Emotional Needs, Initial Encounter  Encounter Code:     Crisis (if applicable): Type: (P) Emotional distress  Service Provided For: (P) Patient, Family     Patient was available.    Faith, Belief, Meaning:   Patient has beliefs or practices that help with coping during difficult times  Family/Friends have beliefs or practices that help with coping during difficult times  Rituals (if applicable)      Importance and Influence:  Patient has spiritual/personal beliefs that influence decisions regarding their health  Family/Friends has spiritual/personal beliefs that influence decisions regarding the patient's health    Community:  Patient   indicated that they feel well-supported  Family/Friends   indicated that they feel well-supported    Assessment and Plan of Care:   Emotions Expressed by Patient/Family/Friends:   Assessment: (P) Anxious, Coping    Interventions by Chaplain:   Intervention: (P) Prayer (assurance of)/Blessing, Sustaining Presence/Ministry of presence, Nurtured Hope, Active listening, Discussed meaning/purpose, Discussed belief system/religious practices/faith     Result/ Response by Patient/Family/Friends:   Outcome: (P) Comfort, Coping, Encouraged, Engaged in conversation, Expressed  Gratitude    Plan of Care:   Plan and Referrals  Plan/Referrals: (P) Continue to visit, (comment), Continue Support (comment)     Electronically signed by REGINAL VERIA Kerry, on 11/30/2023 at 3:44 PM.  Spiritual Health   [facility]   [phone]    11/30/23 1541   Encounter Summary   Encounter Overview/Reason Spiritual/Emotional Needs;Initial Encounter   Service Provided For Patient;Family   Referral/Consult From Rounding   Support System Children   Last Encounter  11/30/23   Complexity of Encounter High   Begin Time 1425   End Time  1440   Total Time Calculated 15 min   Crisis   Type Emotional distress   Spiritual/Emotional needs   Type Spiritual Support;Emotional Distress   Grief, Loss, and Adjustments   Type Life Adjustments;Adjustment to illness;Anticipatory Grief   Assessment/Intervention/Outcome   Assessment Anxious;Coping   Intervention Prayer (assurance of)/Blessing;Sustaining Presence/Ministry of presence;Nurtured Hope;Active listening;Discussed meaning/purpose;Discussed belief system/religious practices/faith   Outcome Comfort;Coping;Encouraged;Engaged in conversation;Expressed Gratitude   Plan and Referrals   Plan/Referrals Continue to visit, (comment);Continue Support (comment)       "

## 2023-11-30 NOTE — Anesthesia Pre Procedure (Signed)
 "Department of Anesthesiology  Preprocedure Note       Name:  Katie Juarez   Age:  83 y.o.  DOB:  Mar 24, 1940                                          MRN:  [id number]         Date:  11/30/2023      Surgeon: Clotilde):  [name], MD    Procedure: Procedure(s):  L3-4 LUMBAR LAMINECTOMY DECOMPRESSION POSTERIOR WITH COFLEX STABILIZATION    Medications prior to admission:   Prior to Admission medications   Medication Sig Start Date End Date Taking? Authorizing Provider   sulfamethoxazole -trimethoprim  (BACTRIM  DS;SEPTRA  DS) 800-160 MG per tablet Take 1 tablet by mouth 2 times daily for 5 days 11/27/23 12/02/23 Yes Black, Karin K, DO   omeprazole  (PRILOSEC) 40 MG delayed release capsule Take 1 capsule by mouth daily   Yes [provider]   ibuprofen  (ADVIL ;MOTRIN ) 200 MG tablet Take 1 tablet by mouth every 6 hours as needed for Pain   Yes [provider]   atorvastatin  (LIPITOR ) 20 MG tablet Take 1 tablet by mouth daily 11/03/23  Yes Black, Karin K, DO   citalopram  (CELEXA ) 20 MG tablet Take 1 tablet by mouth daily 11/03/23  Yes Black, Karin K, DO   LORazepam  (ATIVAN ) 0.5 MG tablet Take 1 tablet by mouth nightly as needed for Anxiety for up to 90 days. Max Daily Amount: 0.5 mg 11/03/23 02/01/24 Yes Black, Karin K, DO   acetaminophen  (TYLENOL ) 325 MG tablet Take 4 tablets by mouth in the morning and at bedtime   Yes [provider]   fluticasone  (ARNUITY ELLIPTA ) 100 MCG/ACT AEPB Inhale 1 puff into the lungs daily 11/03/23  Yes Black, Karin K, DO   INCRUSE ELLIPTA  62.5 MCG/ACT inhaler Inhale 1 puff into the lungs daily 11/03/23  Yes Black, Karin K, DO   donepezil  (ARICEPT ) 10 MG tablet Take 1 tablet by mouth nightly  Patient taking differently: Take 0.5 tablets by mouth nightly 11/03/23  Yes Black, Karin K, DO   famotidine  (PEPCID ) 40 MG tablet Take 1 tablet by mouth 2 times daily 10/26/23  Yes Black, Karin K, DO   docusate sodium  (STOOL SOFTENER) 100 MG capsule Take 1 capsule by mouth  as needed   Yes [provider]   EQ 8HR ARTHRITIS PAIN RELIEF 650 MG extended release tablet  08/05/21  Yes [provider]   latanoprost  (XALATAN ) 0.005 % ophthalmic solution  08/09/21  Yes [provider]   polyethylene glycol (GLYCOLAX ) 17 GM/SCOOP powder Take 17 g by mouth daily As needed.   Yes [provider]   Multiple Vitamins-Minerals (THERAPEUTIC MULTIVITAMIN-MINERALS) tablet Take 1 tablet by mouth daily   Yes [provider]   polyvinyl alcohol-povidone (HYPOTEARS) 1.4-0.6 % ophthalmic solution Place 1-2 drops into both eyes as needed   Yes [provider]   gabapentin  (NEURONTIN ) 100 MG capsule Take 1 capsule by mouth at bedtime for 90 days.  Patient taking differently: Take 1 capsule by mouth at bedtime. 2 tabs 11/03/23 02/01/24  Black, Karin K, DO   Handicap Placard MISC by Does not apply route Issue parking placard for person with disability; Applicant meets the qualifying disability criteria. Length of time expected to have disability__X___Lifetime. 05/03/23   Black, Karin K, DO  Current medications:    Current Facility-Administered Medications   Medication Dose Route Frequency Provider Last Rate Last Admin    lidocaine  1 % injection 1 mL  1 mL IntraDERmal Once PRN Stark, Carilyn, MD        lactated ringers  infusion   IntraVENous Continuous Stark, Carilyn, MD        sodium chloride  flush 0.9 % injection 5-40 mL  5-40 mL IntraVENous 2 times per day Stark, Carilyn, MD        sodium chloride  flush 0.9 % injection 5-40 mL  5-40 mL IntraVENous PRN Stark, Carilyn, MD        0.9 % sodium chloride  infusion   IntraVENous PRN Stark, Carilyn, MD           Allergies:    Allergies   Allergen Reactions    Morphine Other (See Comments)     Night sweats,felt loopy.    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    Pain of both sacroiliac joints M53.3    Piriformis syndrome of left side G57.02    Lumbar radicular pain M54.16    Neural foraminal stenosis of lumbar spine M48.061    Chronic low back pain M54.50, G89.29    Spinal stenosis, lumbar M48.061    Acquired absence of other specified parts of digestive tract Z90.49    Arthrodesis status Z98.1    Asymptomatic menopausal state Z78.0    Extravasation of urine R39.0    Gastro-esophageal reflux disease without esophagitis K21.9    Hyperlipidemia, unspecified E78.5    Closed compression fracture of body of L1 vertebra (HCC) S32.010A    Lumbar burst fracture, open, initial encounter (HCC) S32.001B    Pharyngoesophageal dysphagia R13.14    Anxiety F41.9    Benign essential tremor G25.0    Dizziness R42    Memory problem R41.3    Balance problems R26.89    Chronic cerebral ischemia I67.82    Neck stiffness M43.6    Tingling sensation R20.2    Left arm weakness R29.898    Dysphagia R13.10    Brain dysfunction G93.89    Carotid stenosis, asymptomatic, bilateral I65.23    Knee pain M25.569    Fatigue R53.83    Osteoarthrosis M19.90    Alzheimer's disease, unspecified G30.9    Chronic obstructive pulmonary disease, unspecified J44.9    Hypertensive urgency I16.0    Bradycardia R00.1    Frequent falls R29.6    Thrush, oral B37.0    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    Diverticulosis K57.90    Coronary artery disease involving native heart without angina pectoris I25.10    Essential hypertension I10    Senile osteoporosis M81.0       Past Medical History:        Diagnosis Date    Arthritis     Basal cell carcinoma (BCC) of face 10/2018    excised by Dr Bethel     Breast cancer [facility]) 1999    in remission    Chronic pain     Constipation 12/27/2018    COPD (chronic obstructive pulmonary disease) (HCC)     COVID-19  11/06/2020  DDD (degenerative disc disease)     Diverticulitis 06/28/2018    GERD (gastroesophageal reflux disease)     Ileus (HCC) 08/07/2021    Large bowel obstruction (HCC) 02/2015    [place]    LBP (low back pain)     Osteoporosis     per DEXA 05/26/22    Pharyngoesophageal dysphagia     SS (spinal stenosis)        Past Surgical History:        Procedure Laterality Date    APPENDECTOMY  1956    BLADDER SUSPENSION  1990    BREAST LUMPECTOMY Right 1999    CARDIAC CATHETERIZATION  12/21/2020    Dr Glady    CHOLECYSTECTOMY  1990    COLONOSCOPY      EXCISION/BIOPSY Left 11/01/2018    Dr Bethel / [facility], for [facility]    EYE SURGERY Bilateral     cataracts    FEMORAL HERNIA REPAIR Left 02/20/2015    strangulated with small bowel resection - [place] ,  [facility], [id number]    HYSTERECTOMY (CERVIX STATUS UNKNOWN)  1980    Done for endometriosis; done in [place]    LUMBAR SPINE SURGERY  01/03/2019    VERTEBRAL AUMENTATION  L1      OTHER SURGICAL HISTORY  11/14/13, 06/23/11, 06/30/11, 07/12/11 12/29/11    L4/L5 IESI    OTHER SURGICAL HISTORY  12/17/2013    L5/S1 IESI    OTHER SURGICAL HISTORY Bilateral 05/30/2014    SIJ and piriformis    OTHER SURGICAL HISTORY Bilateral 09/16/2014    L3, L4, L5 Diagnostic Medial Branch Block    OTHER SURGICAL HISTORY Bilateral 09/26/2014    L5 TFE    OTHER SURGICAL HISTORY  10/10/2014    bil L5 TFE    OTHER SURGICAL HISTORY  10/31/2014    caudal    SMALL INTESTINE SURGERY  02/20/2015    strangulated left femoral hernia and wound healing    SPINE SURGERY N/A 01/03/2019    VERTEBRAL AUMENTATION  L1  (C-ARM X2, performed by Debby KANDICE Mitts, MD at [facility] OR    SPINE SURGERY N/A 10/13/2023    SACROILIAC JOINT FUSION POSTERIOR WITH SI BONE performed by Lynnea Prentice PEDLAR, DO at [facility] OR    TONSILLECTOMY  1946    UPPER GASTROINTESTINAL ENDOSCOPY N/A 04/24/2019    EGD BIOPSY performed by Reyes DELENA Griffin, MD at [facility] OR       Social  History:    Social History     Tobacco Use    Smoking status: Former     Current packs/day: 0.00     Average packs/day: 1 pack/day for 40.0 years (40.0 ttl pk-yrs)     Types: Cigarettes     Start date: 01/10/1953     Quit date: 01/11/1988     Years since quitting: 35.9     Passive exposure: Past    Smokeless tobacco: Never    Tobacco comments:     quit 25 years ago 1990   Substance Use Topics    Alcohol use: No     Alcohol/week: 0.0 standard drinks of alcohol                                Counseling given: Not Answered  Tobacco comments: quit 25 years ago 1990      Vital Signs (Current):   Vitals:    11/30/23 1013  BP: (!) 184/78   Pulse: 61   Resp: 20   Temp: 98.7 F (37.1 C)   TempSrc: Infrared   SpO2: 94%   Weight: 56.4 kg (124 lb 6.4 oz)   Height: 1.626 m (5' 4)                                              BP Readings from Last 3 Encounters:   11/30/23 (!) 184/78   11/21/23 (!) 160/81   11/03/23 136/72       NPO Status: Time of last liquid consumption: 1900                        Time of last solid consumption: 1830                        Date of last liquid consumption: 11/29/23                        Date of last solid food consumption: 11/29/23    BMI:   Wt Readings from Last 3 Encounters:   11/30/23 56.4 kg (124 lb 6.4 oz)   11/21/23 55.8 kg (123 lb)   11/03/23 56.4 kg (124 lb 6.4 oz)     Body mass index is 21.35 kg/m.    CBC:   Lab Results   Component Value Date/Time    WBC 11.7 11/02/2023 10:37 AM    RBC 4.04 11/02/2023 10:37 AM    RBC 4.29 08/07/2021 01:10 PM    HGB 13.0 11/02/2023 10:37 AM    HCT 41.1 11/02/2023 10:37 AM    MCV 101.7 11/02/2023 10:37 AM    RDW 13.9 11/02/2023 10:37 AM    PLT 247 11/02/2023 10:37 AM       CMP:   Lab Results   Component Value Date/Time    NA 142 10/11/2023 08:48 AM    K 3.8 10/11/2023 08:48 AM    CL 104 10/11/2023 08:48 AM    CO2 26 10/11/2023 08:48 AM    BUN 25 10/11/2023 08:48 AM    CREATININE 1.0 10/11/2023 08:48 AM    GFRAA >60 06/15/2020 12:01 PM    AGRATIO 1.4  09/27/2017 08:50 AM    LABGLOM 56 10/11/2023 08:48 AM    LABGLOM >60 12/28/2021 02:10 PM    GLUCOSE 104 10/11/2023 08:48 AM    GLUCOSE 88 08/07/2021 01:10 PM    CALCIUM  9.3 10/11/2023 08:48 AM    BILITOT 0.3 05/03/2023 01:28 PM    ALKPHOS 84 05/03/2023 01:28 PM    AST 18 05/03/2023 01:28 PM    ALT 9 05/03/2023 01:28 PM       POC Tests: No results for input(s): POCGLU, POCNA, POCK, POCCL, POCBUN, POCHEMO, POCHCT in the last 72 hours.    Coags:   Lab Results   Component Value Date/Time    PROTIME 10.2 01/02/2019 07:11 PM    INR 1.0 01/02/2019 07:11 PM       HCG (If Applicable): No results found for: PREGTESTUR, PREGSERUM, HCG, HCGQUANT     ABGs: No results found for: PHART, PO2ART, PCO2ART, HCO3ART, BEART, O2SATART     Type & Screen (If Applicable):  No results found for: ABORH, LABANTI    Drug/Infectious Status (If Applicable):  No results found for: HIV,  HEPCAB    COVID-19 Screening (If Applicable):   Lab Results   Component Value Date/Time    COVID19 Negative 01/11/2023 12:33 PM    COVID19 Not Detected 04/19/2019 09:38 AM    COVID19 Not Detected 01/02/2019 09:32 AM           Anesthesia Evaluation    Patient summary reviewed and Nursing notes reviewed      Airway:  Mallampati: II  TM distance: >3 FB   Neck ROM: full    Mouth opening: > = 3 FB   Dental:       Comment: -MISSING ONE UPPER RIGHT TOOTH    Pulmonary:   normal exam     (+)    COPD:                                ROS comment: -QUIT SMOKING 1991   Cardiovascular:       (+)       pulmonary hypertension:      CAD:                                       ROS comment: -CAROTID ARTERY STENOSIS - ASYMPTOMATIC  -EKG - SR @ 69  -ECHO 2025 - EF-60%; LVH; MILD AORTIC REGURG; MODERATE TRICUSPID REGURG; PULM HTN     Neuro/Psych:    (+)               neurocognitive disorder or dementia       ROS comment: -GLAUCOMA  -ALZHEIMER'S DISEASE  -TREMOR  -SPINAL STENOSIS  -DDD  -OSTEOPOROSIS        GI/Hepatic/Renal:    (+)   GERD:                 Endo/Other:     (+)   :  :  arthritis:    malignancy/cancer            ROS comment: -SKIN CANCER  -BREAST CANCER  -NPO AFTER MIDNIGHT  -ALLERGIES - MORPHINE, PCN, ZITHROMAX       Abdominal:  normal exam            Vascular:  negative vascular ROS.       Other Findings:            Anesthesia Plan      general     ASA 3     (GETA; PRONE)  Induction: intravenous.    MIPS: Postoperative opioids intended and Prophylactic antiemetics administered.  Anesthetic plan and risks discussed with patient.      Plan discussed with CRNA.    Attending anesthesiologist reviewed and agrees with Preprocedure content              Katie BOTTS, MD   11/30/2023            "

## 2023-11-30 NOTE — Op Note (Signed)
 "            [facility]              [address] [place], [place] [address]                            OPERATIVE REPORT      PATIENT NAME: Katie Juarez, Katie Juarez             DOB: 01-Jan-1941  MED REC NO: [id number]                         ROOM: STVZP OR POOL RM  ACCOUNT NO: [id number]                       ADMIT DATE: 11/30/2023  PROVIDER: Debby KANDICE Mitts, MD      DATE OF PROCEDURE:  11/30/2023    SURGEON:  Debby KANDICE Mitts, MD    ASSISTANT:  Jayson Penny, DO.    PREOPERATIVE DIAGNOSES:    1. Scoliosis, L3-L4.  2. Lumbar fusion, L4-L5.  3. Spinal stenosis, L3-L4.    POSTOPERATIVE DIAGNOSES:    1. Scoliosis, L3-L4.  2. Lumbar fusion, L4-L5.  3. Spinal stenosis, L3-L4.    PROCEDURES:  Revision laminectomy, medial facetectomy, and foraminotomy, right L3-L4.    INDICATIONS:  The patient is an 83 year old lady, presents for lumbar decompression due to stenosis, collapse and foraminal changes across the L3-L4 segment.  Due to her pain and symptoms, she was counseled on surgical treatment.  The surgical procedure, risks, benefits, and complications were discussed with good comprehension and informed consent obtained.    DESCRIPTION OF PROCEDURE:  The patient was brought to the operating room, placed under appropriate general anesthesia, transferred to the operating room table in the prone position on the Wilson frame on the [place] table.  Bony prominences, axillae, and eyes were all protected.  Perioperative antibiotics were given prior to incision time, and VTE prophylaxis done intraoperatively through SCD cuffs.  The back was prepped and draped in the usual fashion.  Time-out was performed.    We utilized the old incision, dissected down through the skin and subcu tissues, and the interval was then opened.  No definitive significant bleeding, we dissected along the interval.  We got down to the right side, x-ray confirmed our interval, and we dissected along the border of the lamina of  proximal and the facet.  There was a significant gapping, inferior facet fracture with unstable segment.  There was a significant void throughout that lateral facet interval.  The left side was stable.    Once we had that opened, we proximally did a revision laminectomy proximally, freed up that, and got that opened.  Distally, we had good interval with some scarring, but no nerve compression.    So, on the right side, we undercut it and got a scar down facet capsule at the dura.  We had a small dural leak, repaired that with 6-0 Prolene, and got a good closure.    The wound appeared watertight.  We then was able to finish up our undercutting, medial facetectomy and then foraminotomy, tracing out the L4 nerve which had been quite stuck and scarred.  Once that was freed, it was mobile now around the pedicle, with no other impingement.    The foramina at 3 was good.  There was no evidence of a leak at this point.  We did place DuraSeal across that, we then closed the tissues back to midline using #1 Vicryl interrupted sutures, 0 and 2-0 in subcutaneous tissues, and a subcuticular stitch in the skin.  Dressing was applied.  The patient was transferred to bed, awoken from anesthesia, and brought to recovery room in satisfactory condition.  EBL 25 ml.  No specimen.          DEBBY KANDICE MITTS, MD      D:  11/30/2023 17:02:40     T:  11/30/2023 23:48:15     TGA/AQS  Job #:  [id number]     Doc#:  [id number]    CC:   Debby KANDICE Mitts, MD  "

## 2023-11-30 NOTE — H&P (Signed)
 "Surgical History and Physical Exam    Reason for surgery:  The patient is a 83 y.o. female who presents for L3-4 LUMBAR LAMINECTOMY DECOMPRESSION POSTERIOR WITH COFLEX STABILIZATION.         Past Medical History:    Past Medical History:   Diagnosis Date    Arthritis     Basal cell carcinoma (BCC) of face 10/2018    excised by Dr Bethel     Breast cancer [facility]) 1999    in remission    Chronic pain     Constipation 12/27/2018    COPD (chronic obstructive pulmonary disease) (HCC)     COVID-19 11/06/2020    DDD (degenerative disc disease)     Diverticulitis 06/28/2018    GERD (gastroesophageal reflux disease)     Ileus (HCC) 08/07/2021    Large bowel obstruction (HCC) 02/2015    [place]    LBP (low back pain)     Osteoporosis     per DEXA 05/26/22    Pharyngoesophageal dysphagia     SS (spinal stenosis)      Past Surgical History:    Past Surgical History:   Procedure Laterality Date    APPENDECTOMY  1956    BLADDER SUSPENSION  1990    BREAST LUMPECTOMY Right 1999    CARDIAC CATHETERIZATION  12/21/2020    Dr Glady    CHOLECYSTECTOMY  1990    COLONOSCOPY      EXCISION/BIOPSY Left 11/01/2018    Dr Bethel / [facility], for [facility]    EYE SURGERY Bilateral     cataracts    FEMORAL HERNIA REPAIR Left 02/20/2015    strangulated with small bowel resection - [place] ,  [facility], [id number]    HYSTERECTOMY (CERVIX STATUS UNKNOWN)  1980    Done for endometriosis; done in [place]    LUMBAR SPINE SURGERY  01/03/2019    VERTEBRAL AUMENTATION  L1      OTHER SURGICAL HISTORY  11/14/13, 06/23/11, 06/30/11, 07/12/11 12/29/11    L4/L5 IESI    OTHER SURGICAL HISTORY  12/17/2013    L5/S1 IESI    OTHER SURGICAL HISTORY Bilateral 05/30/2014    SIJ and piriformis    OTHER SURGICAL HISTORY Bilateral 09/16/2014    L3, L4, L5 Diagnostic Medial Branch Block    OTHER SURGICAL HISTORY Bilateral 09/26/2014    L5 TFE    OTHER SURGICAL HISTORY  10/10/2014    bil L5 TFE    OTHER SURGICAL HISTORY  10/31/2014    caudal    SMALL INTESTINE  SURGERY  02/20/2015    strangulated left femoral hernia and wound healing    SPINE SURGERY N/A 01/03/2019    VERTEBRAL AUMENTATION  L1  (C-ARM X2, performed by Debby KANDICE Mitts, MD at [facility] OR    SPINE SURGERY N/A 10/13/2023    SACROILIAC JOINT FUSION POSTERIOR WITH SI BONE performed by Lynnea Prentice PEDLAR, DO at [facility] OR    TONSILLECTOMY  1946    UPPER GASTROINTESTINAL ENDOSCOPY N/A 04/24/2019    EGD BIOPSY performed by Reyes DELENA Griffin, MD at [facility] OR     Medications Prior to Admission:   Prior to Admission medications   Medication Sig Start Date End Date Taking? Authorizing Provider   sulfamethoxazole -trimethoprim  (BACTRIM  DS;SEPTRA  DS) 800-160 MG per tablet Take 1 tablet by mouth 2 times daily for 5 days 11/27/23 12/02/23 Yes Black, Karin K, DO   omeprazole  (PRILOSEC) 40 MG delayed release capsule Take 1 capsule by mouth daily  Yes [provider]   ibuprofen  (ADVIL ;MOTRIN ) 200 MG tablet Take 1 tablet by mouth every 6 hours as needed for Pain   Yes [provider]   atorvastatin  (LIPITOR ) 20 MG tablet Take 1 tablet by mouth daily 11/03/23  Yes Black, Karin K, DO   citalopram  (CELEXA ) 20 MG tablet Take 1 tablet by mouth daily 11/03/23  Yes Black, Karin K, DO   LORazepam  (ATIVAN ) 0.5 MG tablet Take 1 tablet by mouth nightly as needed for Anxiety for up to 90 days. Max Daily Amount: 0.5 mg 11/03/23 02/01/24 Yes Black, Karin K, DO   acetaminophen  (TYLENOL ) 325 MG tablet Take 4 tablets by mouth in the morning and at bedtime   Yes [provider]   fluticasone  (ARNUITY ELLIPTA ) 100 MCG/ACT AEPB Inhale 1 puff into the lungs daily 11/03/23  Yes Black, Karin K, DO   INCRUSE ELLIPTA  62.5 MCG/ACT inhaler Inhale 1 puff into the lungs daily 11/03/23  Yes Black, Karin K, DO   donepezil  (ARICEPT ) 10 MG tablet Take 1 tablet by mouth nightly  Patient taking differently: Take 0.5 tablets by mouth nightly 11/03/23  Yes Black, Karin K, DO   famotidine  (PEPCID ) 40 MG tablet Take 1 tablet by  mouth 2 times daily 10/26/23  Yes Black, Karin K, DO   docusate sodium  (STOOL SOFTENER) 100 MG capsule Take 1 capsule by mouth as needed   Yes [provider]   EQ 8HR ARTHRITIS PAIN RELIEF 650 MG extended release tablet  08/05/21  Yes [provider]   latanoprost  (XALATAN ) 0.005 % ophthalmic solution  08/09/21  Yes [provider]   polyethylene glycol (GLYCOLAX ) 17 GM/SCOOP powder Take 17 g by mouth daily As needed.   Yes [provider]   Multiple Vitamins-Minerals (THERAPEUTIC MULTIVITAMIN-MINERALS) tablet Take 1 tablet by mouth daily   Yes [provider]   polyvinyl alcohol-povidone (HYPOTEARS) 1.4-0.6 % ophthalmic solution Place 1-2 drops into both eyes as needed   Yes [provider]   gabapentin  (NEURONTIN ) 100 MG capsule Take 1 capsule by mouth at bedtime for 90 days.  Patient taking differently: Take 1 capsule by mouth at bedtime. 2 tabs 11/03/23 02/01/24  Black, Karin K, DO   Handicap Placard MISC by Does not apply route Issue parking placard for person with disability; Applicant meets the qualifying disability criteria. Length of time expected to have disability__X___Lifetime. 05/03/23   Black, Karin K, DO     Allergies:    Morphine, Pcn [penicillins], Zithromax [azithromycin], and Adhesive tape  Social History:   Social History     Socioeconomic History    Marital status: Widowed     Spouse name: None    Number of children: None    Years of education: None    Highest education level: None   Tobacco Use    Smoking status: Former     Current packs/day: 0.00     Average packs/day: 1 pack/day for 40.0 years (40.0 ttl pk-yrs)     Types: Cigarettes     Start date: 01/10/1953     Quit date: 01/11/1988     Years since quitting: 35.9     Passive exposure: Past    Smokeless tobacco: Never    Tobacco comments:     quit 25 years ago 1990   Vaping Use    Vaping status: Never Used   Substance and Sexual Activity    Alcohol use: No     Alcohol/week: 0.0 standard drinks  of alcohol  Drug use: Never    Sexual activity: Not Currently     Social Drivers of Health     Financial Resource Strain: Low Risk  (05/18/2022)    Overall Financial Resource Strain (CARDIA)     Difficulty of Paying Living Expenses: Not hard at all   Food Insecurity: No Food Insecurity (03/20/2023)    Hunger Vital Sign     Worried About Running Out of Food in the Last Year: Never true     Ran Out of Food in the Last Year: Never true   Transportation Needs: No Transportation Needs (03/20/2023)    PRAPARE - Therapist, Art (Medical): No     Lack of Transportation (Non-Medical): No   Physical Activity: Inactive (12/27/2022)    Exercise Vital Sign     Days of Exercise per Week: 0 days     Minutes of Exercise per Session: 0 min   Housing Stability: Low Risk  (03/20/2023)    Housing Stability Vital Sign     Unable to Pay for Housing in the Last Year: No     Number of Times Moved in the Last Year: 0     Homeless in the Last Year: No     Family History:  Family History   Problem Relation Age of Onset    Other Mother         ALS - diagnosed at age 17    Stroke Father         age 61    Cancer Sister         pt thinks it was liver    Cancer Brother         leukemia    Diabetes Maternal Grandfather     Other Brother         drowned    Other Brother         died at age 57; think due to spinal meningitis    Lupus Sister     Alzheimer's Disease Sister     Dementia Sister     Mult Sclerosis Nephew     Mult Sclerosis Daughter        REVIEW OF SYSTEMS:  Constitutional: Negative for fever and chills.   HENT: Negative for congestion or drainage   Eyes: Negative for blurred vision and double vision.   Respiratory: Negative for cough, shortness of breath and wheezing.   Cardiovascular: Negative for chest pain and palpitations.   Gastrointestinal: Negative for nausea. Negative for vomiting.   Musculoskeletal: Positive for myalgias and joint pain.   Skin: Negative for itching and rash.   Neurological: Negative for  dizziness, sensory change and headaches.   Psychiatric/Behavioral: Negative for depression and suicidal ideas.        PHYSICAL EXAM:  Blood pressure (!) 184/78, pulse 61, temperature 98.7 F (37.1 C), temperature source Infrared, resp. rate 20, height 1.626 m (5' 4), weight 56.4 kg (124 lb 6.4 oz), SpO2 94%, not currently breastfeeding.  Gen: alert and oriented, NAD, cooperative  Head: normocephalic atraumatic   Cardiovascular: Regular rate, no dependent edema, distal pulses 2+  Respiratory: Chest symmetric, no accessory muscle use, normal respirations  MSK: Skin intact. No step offs.     A/P: 83 y.o. female  was evaluated and after discussion surgical and non surgical options, the patient has decided to undergo L3-4 LUMBAR LAMINECTOMY DECOMPRESSION POSTERIOR WITH COFLEX STABILIZATION. Consent obtained and in chart. All questions answered appropriately. Surgical risks including but not  limited to: bleeding, blood clots, infection, damage to surrounding tissues/nerves/vessels, failure of fixation, failure of wounds to heal, loss of motion, stiffness, dislocation, postoperative pain, recurrence of symptoms, need for future surgery,  risks of anesthesia, loss of limb and loss of life were all discussed with the patient. Knowing these risks, the patient wishes to proceed with surgery.   -Abx OCTOR  -Site marked, Consent in chart  -AC held  -NPO since midnight  -All questions answered.    Jayson Penny, DO  Orthopedic Surgery, PGY-1  [facility]. [facility], [place]               &  "

## 2023-11-30 NOTE — Discharge Instructions (Addendum)
"  Orthopedic Spine Discharge Instructions:  -Mobilize as tolerated but take it easy for 1-2 weeks. Avoid excessive Bending, Lifting, and Twisting (BLT).  -Maintain surgical dressing in place until follow-up if possible. If dressing comes off with activity and hygiene, may leave uncovered if no drainage. Otherwise may replace with simple gauze and tape dressing.  -Ice (20 minutes on and off 1 hour) as needed for swelling/pain.  -Drink plenty of fluids.  -Call the office or come to Emergency Room if signs of infection appear (hot, swollen, red, draining pus, fever).  -Take medications as prescribed.  -Wean off narcotics (Percocet ) as soon as possible. Do not take Tylenol  at the same time as percocet  as percocet  contains tylenol .  -No alcoholic beverages or driving/operating machinery while on narcotics  -Follow up with Dr. Markham in his office in 10-14 days after surgery/discharge. Call [phone] to schedule.       Activity  You have had anesthesia today  Do not drive, operate heavy equipment, consume alcoholic beverages, or make any important decisions  for 24 hours   If you are taking pain medication: Do not drive or consume alcohol.  Take your time changing positions today. You may feel light headed or dizzy if you move too quickly.   Continue your home medications as ordered by your physician.  Diet   You can eat your normal diet when you feel well. You should start off with bland foods like chicken soup, toast, or yogurt. Then advance as tolerated.  Drink plenty of fluids (unless your doctor tells you not to). Your urine should be very lightly colored without a strong odor.     "

## 2023-11-30 NOTE — Brief Op Note (Signed)
"  Brief Postoperative Note      Patient: Katie Juarez  Date of Birth: 31-Aug-1940  MRN: [id number]    Date of Procedure: 12-17-2023    Pre-Op Diagnosis Codes:   Lumbar spinal stenosis     Post-Op Diagnosis:   Lumbar spinal stenosis        Procedure(s):  L3-4 LUMBAR LAMINECTOMY     Surgeon(s):  [name], MD    Assistant:  Resident: [name], DO    Anesthesia: General    Estimated Blood Loss (mL): 30 mL     Fluids (mL): 1100 mL crystalloids     Complications: None    Specimens:   * No specimens in log *    Implants:  Implant Name Type Inv. Item Serial No. Manufacturer Lot No. LRB No. Used Action   SYSTEM SEAL SPINE DURA FOR CRAN SURG DURASEAL [id number]  SYSTEM SEAL SPINE DURA FOR CRAN SURG DURASEAL  [employer] [id number] N/A 1 Implanted       Hemostatic Agents/Irrigation Solution (excluding Saline & Sterile Water  Irrigation):  No hemostatic agent or irrigation used        Drains: * No LDAs found *    Findings:  Present At Time Of Surgery (PATOS) (choose all levels that have infection present):  No infection present  Other Findings: Lumbar spinal stenosis     Electronically signed by SAVANT KEARNEY, DO on 17-Dec-2023 at 1:56 PM  "

## 2023-12-11 ENCOUNTER — Encounter: Payer: MEDICARE | Attending: Internal Medicine | Primary: Family Medicine

## 2023-12-12 ENCOUNTER — Encounter

## 2023-12-12 NOTE — Telephone Encounter (Signed)
"    Katie Juarez called requesting a refill of the below medication which has been pended for you:     Requested Prescriptions     Pending Prescriptions Disp Refills    donepezil  (ARICEPT ) 10 MG tablet [Pharmacy Med Name: Donepezil  HCl 10 MG Oral Tablet] 90 tablet 0     Sig: Take 1 tablet by mouth nightly       Last Appointment Date: 11/03/2023  Next Appointment Date: 12/27/2023    Allergies   Allergen Reactions    Morphine Other (See Comments)     Night sweats,felt loopy.    Pcn [Penicillins] Hives    Zithromax [Azithromycin] Hives    Adhesive Tape Rash     "

## 2023-12-14 MED ORDER — DONEPEZIL HCL 10 MG PO TABS
10 | ORAL_TABLET | Freq: Every evening | ORAL | 1 refills | 70.00000 days | Status: AC
Start: 2023-12-14 — End: ?

## 2023-12-14 NOTE — Telephone Encounter (Signed)
"  Patient had been taking the whole 10mg  tablet instead of 5mg  so is now needing new script. Would like to stay on 10mg  dose.     Katie Juarez called requesting a refill of the below medication which has been pended for you:     Requested Prescriptions     Pending Prescriptions Disp Refills    donepezil  (ARICEPT ) 10 MG tablet [Pharmacy Med Name: Donepezil  HCl 10 MG Oral Tablet] 90 tablet 0     Sig: Take 1 tablet by mouth nightly       Last Appointment Date: 11/03/2023  Next Appointment Date: 12/27/2023    Allergies   Allergen Reactions    Morphine Other (See Comments)     Night sweats,felt loopy.    Pcn [Penicillins] Hives    Zithromax [Azithromycin] Hives    Adhesive Tape Rash     "

## 2023-12-14 NOTE — Telephone Encounter (Signed)
"  Please confirm pt's current dose - my last Rx was for 10 mg daily but there was recently a new Rx sent in for her for 5 mg (1/2 of a 10 mg tab) after a recent back procedure last month.  "

## 2023-12-18 ENCOUNTER — Ambulatory Visit: Admit: 2023-12-18 | Discharge: 2023-12-18 | Payer: MEDICARE | Attending: Internal Medicine | Primary: Family Medicine

## 2023-12-18 VITALS — BP 146/90 | HR 59

## 2023-12-18 DIAGNOSIS — I2510 Atherosclerotic heart disease of native coronary artery without angina pectoris: Principal | ICD-10-CM

## 2023-12-18 NOTE — Progress Notes (Signed)
 "      Cardiology Consultation/Follow Up.  [facility]    Katie Juarez  [birth year]  [id number]    Today: 12/18/23    CC: Patient is here for follow up for HTN    HPI:   Katie Juarez is here for follow up for HTN.   In wheelchair today.   Seen with daughter.   Has back brace in place.     BP has been stable on their BP checks.   She states she has no CP.   She has no SOB.   She has no LE edema.   She has no dizziness or syncope.       Past Medical:  Past Medical History:   Diagnosis Date    Arthritis     Basal cell carcinoma (BCC) of face 10/2018    excised by Dr Bethel     Breast cancer [facility]) 1999    in remission    Chronic pain     Constipation 12/27/2018    COPD (chronic obstructive pulmonary disease) (HCC)     COVID-19 11/06/2020    DDD (degenerative disc disease)     Diverticulitis 06/28/2018    GERD (gastroesophageal reflux disease)     Ileus (HCC) 08/07/2021    Large bowel obstruction (HCC) 02/2015    [place]    LBP (low back pain)     Osteoporosis     per DEXA 05/26/22    Pharyngoesophageal dysphagia     SS (spinal stenosis)          Past Surgical:  Past Surgical History:   Procedure Laterality Date    APPENDECTOMY  1956    BLADDER SUSPENSION  1990    BREAST LUMPECTOMY Right 1999    CARDIAC CATHETERIZATION  12/21/2020    Dr Glady    CHOLECYSTECTOMY  1990    COLONOSCOPY      EXCISION/BIOPSY Left 11/01/2018    Dr Bethel / Venancio, for [facility]    EYE SURGERY Bilateral     cataracts    FEMORAL HERNIA REPAIR Left 02/20/2015    strangulated with small bowel resection - [place] ,  [facility], [address]    HYSTERECTOMY (CERVIX STATUS UNKNOWN)  1980    Done for endometriosis; done in [place]    LUMBAR SPINE SURGERY  01/03/2019    VERTEBRAL AUMENTATION  L1      LUMBAR SPINE SURGERY N/A 11/30/2023    L3-4 LUMBAR LAMINECTOMY DECOMPRESSION POSTERIOR performed by [name], MD at [facility] OR    OTHER SURGICAL HISTORY  11/14/13, 06/23/11, 06/30/11, 07/12/11 12/29/11    L4/L5 IESI     OTHER SURGICAL HISTORY  12/17/2013    L5/S1 IESI    OTHER SURGICAL HISTORY Bilateral 05/30/2014    SIJ and piriformis    OTHER SURGICAL HISTORY Bilateral 09/16/2014    L3, L4, L5 Diagnostic Medial Branch Block    OTHER SURGICAL HISTORY Bilateral 09/26/2014    L5 TFE    OTHER SURGICAL HISTORY  10/10/2014    bil L5 TFE    OTHER SURGICAL HISTORY  10/31/2014    caudal    SMALL INTESTINE SURGERY  02/20/2015    strangulated left femoral hernia and wound healing    SPINE SURGERY N/A 01/03/2019    VERTEBRAL AUMENTATION  L1  (C-ARM X2, performed by Debby MATSU Markham, MD at [facility] OR    SPINE SURGERY N/A 10/13/2023    SACROILIAC JOINT FUSION POSTERIOR WITH SI BONE  performed by Lynnea Prentice PEDLAR, DO at [facility] OR    TONSILLECTOMY  1946    UPPER GASTROINTESTINAL ENDOSCOPY N/A 04/24/2019    EGD BIOPSY performed by Reyes DELENA Griffin, MD at [facility] OR         Family History:  Family History   Problem Relation Age of Onset    Other Mother         ALS - diagnosed at age 59    Stroke Father         age 53    Cancer Sister         pt thinks it was liver    Cancer Brother         leukemia    Diabetes Maternal Grandfather     Other Brother         drowned    Other Brother         died at age 73; think due to spinal meningitis    Lupus Sister     Alzheimer's Disease Sister     Dementia Sister     Mult Sclerosis Nephew     Mult Sclerosis Daughter        Social History:  Social History     Socioeconomic History    Marital status: Widowed     Spouse name: Not on file    Number of children: Not on file    Years of education: Not on file    Highest education level: Not on file   Occupational History    Not on file   Tobacco Use    Smoking status: Former     Current packs/day: 0.00     Average packs/day: 1 pack/day for 40.0 years (40.0 ttl pk-yrs)     Types: Cigarettes     Start date: 01/10/1953     Quit date: 01/11/1988     Years since quitting: 35.9     Passive exposure: Past    Smokeless tobacco: Never    Tobacco comments:     quit 25  years ago 1990   Vaping Use    Vaping status: Never Used   Substance and Sexual Activity    Alcohol use: No     Alcohol/week: 0.0 standard drinks of alcohol    Drug use: Never    Sexual activity: Not Currently   Other Topics Concern    Not on file   Social History Narrative    Not on file     Social Drivers of Health     Financial Resource Strain: Low Risk  (05/18/2022)    Overall Financial Resource Strain (CARDIA)     Difficulty of Paying Living Expenses: Not hard at all   Food Insecurity: No Food Insecurity (03/20/2023)    Hunger Vital Sign     Worried About Running Out of Food in the Last Year: Never true     Ran Out of Food in the Last Year: Never true   Transportation Needs: No Transportation Needs (03/20/2023)    PRAPARE - Therapist, Art (Medical): No     Lack of Transportation (Non-Medical): No   Physical Activity: Inactive (12/27/2022)    Exercise Vital Sign     Days of Exercise per Week: 0 days     Minutes of Exercise per Session: 0 min   Stress: Not on file   Social Connections: Not on file   Intimate Partner Violence: Not on file   Housing Stability: Low  Risk  (03/20/2023)    Housing Stability Vital Sign     Unable to Pay for Housing in the Last Year: No     Number of Times Moved in the Last Year: 0     Homeless in the Last Year: No        REVIEW OF SYSTEMS:    Constitutional: there has been no unanticipated weight loss. There's been No change in energy level, No change in activity level.     Eyes: No visual changes or diplopia. No scleral icterus.  ENT: No Headaches, hearing loss or vertigo. No mouth sores or sore throat.  Cardiovascular: AS HPI  Respiratory: AS HPI  Gastrointestinal: No abdominal pain, appetite loss, blood in stools. No change in bowel or bladder habits.  Genitourinary: No dysuria, trouble voiding, or hematuria.  Musculoskeletal:  No gait disturbance, No weakness or joint complaints.  Integumentary: No rash or pruritis.  Neurological: No headache, diplopia, change  in muscle strength, numbness or tingling. No change in gait, balance, coordination, mood, affect, memory, mentation, behavior.  Psychiatric: No new anxiety or depression.  Endocrine: No temperature intolerance. No excessive thirst, fluid intake, or urination. No tremor.  Hematologic/Lymphatic: No abnormal bruising or bleeding, blood clots or swollen lymph nodes.  Allergic/Immunologic: No nasal congestion or hives.    Medications:    Current Outpatient Medications:     donepezil  (ARICEPT ) 10 MG tablet, Take 1 tablet by mouth nightly, Disp: 90 tablet, Rfl: 1    docusate sodium  (COLACE) 100 MG capsule, Take 1 capsule by mouth 2 times daily as needed for Constipation, Disp: 20 capsule, Rfl: 0    acetaminophen  (TYLENOL ) 325 MG tablet, Take 1.5 tablets by mouth every 6 hours as needed for Pain, Disp: 42 tablet, Rfl: 0    omeprazole  (PRILOSEC) 40 MG delayed release capsule, Take 1 capsule by mouth daily, Disp: , Rfl:     ibuprofen  (ADVIL ;MOTRIN ) 200 MG tablet, Take 1 tablet by mouth every 6 hours as needed for Pain, Disp: , Rfl:     atorvastatin  (LIPITOR ) 20 MG tablet, Take 1 tablet by mouth daily, Disp: 90 tablet, Rfl: 3    citalopram  (CELEXA ) 20 MG tablet, Take 1 tablet by mouth daily, Disp: 90 tablet, Rfl: 3    LORazepam  (ATIVAN ) 0.5 MG tablet, Take 1 tablet by mouth nightly as needed for Anxiety for up to 90 days. Max Daily Amount: 0.5 mg, Disp: 90 tablet, Rfl: 0    acetaminophen  (TYLENOL ) 325 MG tablet, Take 4 tablets by mouth in the morning and at bedtime, Disp: , Rfl:     fluticasone  (ARNUITY ELLIPTA ) 100 MCG/ACT AEPB, Inhale 1 puff into the lungs daily, Disp: 90 each, Rfl: 3    INCRUSE ELLIPTA  62.5 MCG/ACT inhaler, Inhale 1 puff into the lungs daily, Disp: 90 each, Rfl: 3    gabapentin  (NEURONTIN ) 100 MG capsule, Take 1 capsule by mouth at bedtime for 90 days. (Patient taking differently: Take 1 capsule by mouth at bedtime. 2 tabs), Disp: 30 capsule, Rfl: 2    famotidine  (PEPCID ) 40 MG tablet, Take 1 tablet by  mouth 2 times daily, Disp: 180 tablet, Rfl: 1    Handicap Placard MISC, by Does not apply route Issue parking placard for person with disability; Applicant meets the qualifying disability criteria. Length of time expected to have disability__X___Lifetime., Disp: 1 each, Rfl: 0    EQ 8HR ARTHRITIS PAIN RELIEF 650 MG extended release tablet, , Disp: , Rfl:     latanoprost  (XALATAN ) 0.005 %  ophthalmic solution, , Disp: , Rfl:     polyethylene glycol (GLYCOLAX ) 17 GM/SCOOP powder, Take 17 g by mouth daily As needed., Disp: , Rfl:     Multiple Vitamins-Minerals (THERAPEUTIC MULTIVITAMIN-MINERALS) tablet, Take 1 tablet by mouth daily, Disp: , Rfl:     polyvinyl alcohol-povidone (HYPOTEARS) 1.4-0.6 % ophthalmic solution, Place 1-2 drops into both eyes as needed, Disp: , Rfl:     docusate sodium  (STOOL SOFTENER) 100 MG capsule, Take 1 capsule by mouth as needed, Disp: , Rfl:      Physical Exam:   Vitals: BP (!) 146/90   Pulse 59   LMP  (LMP Unknown)   SpO2 96%   General appearance: alert and cooperative with exam  HEENT: Head: Normocephalic, no lesions, without obvious abnormality.  Neck: no carotid bruit, no JVD  Lungs: clear to auscultation bilaterally  Heart:  regular rate and rhythm, S1, S2 normal, no Murmur  Abdomen: soft, non-tender; bowel sounds normal; no masses,  no organomegaly  Extremities: no site injection hematoma, extremities normal, atraumatic, no cyanosis. no edema  Neurologic: Mental status: Alert, oriented, thought content appropriate    Labs:  Lab Results   Component Value Date    CHOL 203 (H) 12/26/2022    TRIG 343 (H) 12/26/2022    HDL 53 12/26/2022    VLDL 69 (H) 12/26/2022    CHOLHDLRATIO 3.8 12/26/2022       Lab Results   Component Value Date    NA 142 10/11/2023    K 3.8 10/11/2023    CL 104 10/11/2023    CO2 26 10/11/2023    BUN 25 (H) 10/11/2023    CREATININE 1.0 (H) 10/11/2023    GLUCOSE 104 (H) 10/11/2023    CALCIUM  9.3 10/11/2023    BILITOT 0.3 05/03/2023    ALKPHOS 84 05/03/2023    AST 18  05/03/2023    ALT 9 (L) 05/03/2023    LABGLOM 56 (L) 10/11/2023    GFRAA >60 06/15/2020    AGRATIO 1.4 09/27/2017    GLOB 2.8 08/07/2021       EKG: Sinus Rhythm   -Old anteroseptal infarct.    Cardiac Cath  12/2020    Minimal CAD   D1: proximal 70% but small branch   Preserved LV systolic function    Echo 08/23/21    Left Ventricle: Normal left ventricular systolic function with a visually estimated EF of 60 - 65%. EF by 2D Simpsons Biplane is 65%. Left ventricle size is normal. Mildly increased wall thickness. Diastolic dysfunction present with normal LV EF. Normal left ventricular filling pressure.    Aortic Valve: Trileaflet valve. Mild sclerosis of the aortic valve cusp. Mild regurgitation with a centrally directed jet.    Mitral Valve: Valve structure is normal. Mild annular calcification of the mitral valve. Mild regurgitation with an anterior directed jet.    Tricuspid Valve: Mild regurgitation with a centrally directed jet. The estimated RVSP is 41 mmHg. Mildly elevated RVSP.    Echo 3/25:  Nml LVEF, 60-65%, mild LVH, mild AI, trivial-mild MR, Mild-mod TR, RVSP 49.     Past Medical and Surgical History, Problem List, Allergies, Medications, Labs, Imaging, all reviewed extensively in EMR and with the patient.    Assessment:  - HTN-acceptable control today.  Had some higher readings in the 160s and 170s in November.  - Echo on 3/25 from [facility] [place]  revealed a EF of 60-65%, mild LVH, mild AI, trivial to mild MR, mild to moderate TR, RVSP was  elevated at 49  - CP - resolved  - Non obs CAD Cath 12/22 with D1- 70% but small  - SOB- resolved     Plan:  - We again discussed the possibility of starting small dose antihypertensives with both the patient as well as her daughter.  They would both like to hold off.  They will notify us  if they are getting regular readings in the 160s  - continue ASA, statin    My clinic will serve as the continuing focal point for all needed health care services and/or medical care  services that are part of the ongoing cardiology care to patients singe, serious, or complex cardiology conditions with the following diagnoses: CAD, HTN, MR, TR, AI     The patient was consulted on signs and symptoms of CVD/CHF/ACS and notified when to call office. The patient is to continue heart healthy diet, weight loss and exercise as tolerated. Patient's medications and side effects were discussed. Medication refills were provided if needed. Follow up appointment timing was discussed. All questions and concerns were addressed to patient's satisfaction.     The patient is to follow up in  6 months or sooner if necessary.     Thank you for allowing me to participate in the care of this patient, please do not hesitate to call if you have any questions.    Blondell Gourd, DO, FACC, FACOI, RPVI, FASE, [facility]  [facility]  [email or web address]  [phone]    This note was created with the assistance of a speech-recognition program.  Although the intention is to generate a document that actually reflects the content of the visit, no guarantees can be provided that every mistake has been identified and corrected by editing.   "

## 2023-12-27 ENCOUNTER — Ambulatory Visit: Admit: 2023-12-27 | Discharge: 2023-12-27 | Payer: MEDICARE | Attending: Family Medicine | Primary: Family Medicine

## 2023-12-27 VITALS — BP 134/82 | HR 82 | Temp 97.80000°F | Resp 16 | Ht 64.0 in | Wt 121.2 lb

## 2023-12-27 DIAGNOSIS — R413 Other amnesia: Principal | ICD-10-CM

## 2023-12-27 MED ORDER — TRAMADOL HCL 50 MG PO TABS
50 | ORAL_TABLET | Freq: Two times a day (BID) | ORAL | 0 refills | 7.00000 days | Status: AC | PRN
Start: 2023-12-27 — End: 2024-01-11

## 2023-12-27 MED ORDER — GABAPENTIN 100 MG PO CAPS
100 | ORAL_CAPSULE | Freq: Every evening | ORAL | 0 refills | 30.00000 days | Status: AC
Start: 2023-12-27 — End: 2024-03-26

## 2023-12-27 MED ORDER — PREDNISONE 20 MG PO TABS
20 | ORAL_TABLET | ORAL | 0 refills | 5.00000 days | Status: AC
Start: 2023-12-27 — End: ?

## 2023-12-27 NOTE — Progress Notes (Signed)
 [facility]  [address]  [place], [place] [address]  [phone]      Katie Juarez is a 83 y.o. female who presents today for her medical conditions/complaints as noted below.  Katie Juarez is c/o of f/u meds and f/u breathing      HPI:     History of Present Illness    Pt still having back pain - taking alternating regimen of Tylenol  & Ibuprofen  through the day and night.  Still taking Gabapentin  as well but wondering about anything else she can use for pain.  Will wait to do any further procedures until she returns from spending the winter in [place] .     Still taking Celexa  20 mg daily and Ativan  0.5 mg nightly for anxiety - stable.          Past Medical History:   Diagnosis Date    Arthritis     Basal cell carcinoma (BCC) of face 10/2018    excised by Dr Bethel     Breast cancer [facility]) 1999    in remission    Chronic pain     Constipation 12/27/2018    COPD (chronic obstructive pulmonary disease) (HCC)     COVID-19 11/06/2020    DDD (degenerative disc disease)     Diverticulitis 06/28/2018    GERD (gastroesophageal reflux disease)     Ileus (HCC) 08/07/2021    Large bowel obstruction (HCC) 02/2015    [place]    LBP (low back pain)     Osteoporosis     per DEXA 05/26/22    Pharyngoesophageal dysphagia     SS (spinal stenosis)       Past Surgical History:   Procedure Laterality Date    APPENDECTOMY  1956    BLADDER SUSPENSION  1990    BREAST LUMPECTOMY Right 1999    CARDIAC CATHETERIZATION  12/21/2020    Dr Glady    CHOLECYSTECTOMY  1990    COLONOSCOPY      EXCISION/BIOPSY Left 11/01/2018    Dr Bethel / Venancio, for [facility]    EYE SURGERY Bilateral     cataracts    FEMORAL HERNIA REPAIR Left 02/20/2015    strangulated with small bowel resection - [place] ,  [facility], [address]    HYSTERECTOMY (CERVIX STATUS UNKNOWN)  1980    Done for endometriosis; done in [place]    LUMBAR SPINE SURGERY  01/03/2019    VERTEBRAL AUMENTATION  L1      LUMBAR SPINE  SURGERY N/A 11/30/2023    L3-4 LUMBAR LAMINECTOMY DECOMPRESSION POSTERIOR performed by [name], MD at [facility] OR    OTHER SURGICAL HISTORY  11/14/13, 06/23/11, 06/30/11, 07/12/11 12/29/11    L4/L5 IESI    OTHER SURGICAL HISTORY  12/17/2013    L5/S1 IESI    OTHER SURGICAL HISTORY Bilateral 05/30/2014    SIJ and piriformis    OTHER SURGICAL HISTORY Bilateral 09/16/2014    L3, L4, L5 Diagnostic Medial Branch Block    OTHER SURGICAL HISTORY Bilateral 09/26/2014    L5 TFE    OTHER SURGICAL HISTORY  10/10/2014    bil L5 TFE    OTHER SURGICAL HISTORY  10/31/2014    caudal    SMALL INTESTINE SURGERY  02/20/2015    strangulated left femoral hernia and wound healing    SPINE SURGERY N/A 01/03/2019    VERTEBRAL AUMENTATION  L1  (C-ARM X2, performed by Debby MATSU Markham, MD at  [facility] OR    SPINE SURGERY N/A 10/13/2023    SACROILIAC JOINT FUSION POSTERIOR WITH SI BONE performed by Lynnea Prentice PEDLAR, DO at [facility] OR    TONSILLECTOMY  1946    UPPER GASTROINTESTINAL ENDOSCOPY N/A 04/24/2019    EGD BIOPSY performed by Reyes DELENA Griffin, MD at [facility] OR     Family History   Problem Relation Age of Onset    Other Mother         ALS - diagnosed at age 14    Stroke Father         age 55    Cancer Sister         pt thinks it was liver    Cancer Brother         leukemia    Diabetes Maternal Grandfather     Other Brother         drowned    Other Brother         died at age 5; think due to spinal meningitis    Lupus Sister     Alzheimer's Disease Sister     Dementia Sister     Mult Sclerosis Nephew     Mult Sclerosis Daughter      Social History     Tobacco Use    Smoking status: Former     Current packs/day: 0.00     Average packs/day: 1 pack/day for 40.0 years (40.0 ttl pk-yrs)     Types: Cigarettes     Start date: 01/10/1953     Quit date: 01/11/1988     Years since quitting: 36.0     Passive exposure: Past    Smokeless tobacco: Never    Tobacco comments:     quit 25 years ago 1990   Substance Use Topics    Alcohol  use: Never      Current Outpatient Medications   Medication Sig Dispense Refill    gabapentin  (NEURONTIN ) 100 MG capsule Take 1 capsule by mouth at bedtime for 90 days. 90 capsule 0    predniSONE  (DELTASONE ) 20 MG tablet Take 2 tabs by mouth daily x 5 days, then 1 tab daily x 3 days. 13 tablet 0    donepezil  (ARICEPT ) 10 MG tablet Take 1 tablet by mouth nightly 90 tablet 1    docusate sodium  (COLACE) 100 MG capsule Take 1 capsule by mouth 2 times daily as needed for Constipation 20 capsule 0    acetaminophen  (TYLENOL ) 325 MG tablet Take 1.5 tablets by mouth every 6 hours as needed for Pain 42 tablet 0    ibuprofen  (ADVIL ;MOTRIN ) 200 MG tablet Take 1 tablet by mouth every 6 hours as needed for Pain (Patient taking differently: Take 2 tablets by mouth 2 times daily)      atorvastatin  (LIPITOR ) 20 MG tablet Take 1 tablet by mouth daily 90 tablet 3    citalopram  (CELEXA ) 20 MG tablet Take 1 tablet by mouth daily 90 tablet 3    LORazepam  (ATIVAN ) 0.5 MG tablet Take 1 tablet by mouth nightly as needed for Anxiety for up to 90 days. Max Daily Amount: 0.5 mg 90 tablet 0    fluticasone  (ARNUITY ELLIPTA ) 100 MCG/ACT AEPB Inhale 1 puff into the lungs daily 90 each 3    INCRUSE ELLIPTA  62.5 MCG/ACT inhaler Inhale 1 puff into the lungs daily 90 each 3    famotidine  (PEPCID ) 40 MG tablet Take 1 tablet by mouth 2 times daily 180 tablet 1  Handicap Placard MISC by Does not apply route Issue parking placard for person with disability; Applicant meets the qualifying disability criteria. Length of time expected to have disability__X___Lifetime. 1 each 0    latanoprost  (XALATAN ) 0.005 % ophthalmic solution       polyethylene glycol (GLYCOLAX ) 17 GM/SCOOP powder Take 17 g by mouth daily As needed.      Multiple Vitamins-Minerals (THERAPEUTIC MULTIVITAMIN-MINERALS) tablet Take 1 tablet by mouth daily      polyvinyl alcohol-povidone (HYPOTEARS) 1.4-0.6 % ophthalmic solution Place 1-2 drops into both eyes as needed       No current  facility-administered medications for this visit.     Allergies   Allergen Reactions    Latex Other (See Comments)    Morphine Other (See Comments)     Night sweats,felt loopy.    Pcn [Penicillins] Hives    Zithromax [Azithromycin] Hives    Adhesive Tape Rash       Health Maintenance   Topic Date Due    Lipids  12/26/2023    COVID-19 Vaccine (9 - 2024-25 season) 12/26/2024 (Originally 09/11/2023)    Depression Screen  12/28/2024    Annual Wellness Visit (Medicare)  12/29/2024    DTaP/Tdap/Td vaccine (2 - Td or Tdap) 09/28/2027    DEXA (modify frequency per FRAX score)  Completed    Flu vaccine  Completed    Shingles vaccine  Completed    Pneumococcal 50+ years Vaccine  Completed    Respiratory Syncytial Virus (RSV) Pregnant or age 27 yrs+  Completed    Hepatitis A vaccine  Aged Out    Hepatitis B vaccine  Aged Out    Hib vaccine  Aged Out    Polio vaccine  Aged Out    Meningococcal (ACWY) vaccine  Aged Out    Meningococcal B vaccine  Aged Out       Subjective:      Review of Systems   Genitourinary:  Negative for dysuria and hematuria.   Musculoskeletal:  Positive for back pain.   Psychiatric/Behavioral:  The patient is nervous/anxious (chronic, stable).        Objective:     Vitals:    12/27/23 1129   BP: 134/82   BP Site: Right Upper Arm   Patient Position: Sitting   Pulse: 82   Resp: 16   Temp: 97.8 F (36.6 C)   TempSrc: Temporal   SpO2: 97%   Weight: 55 kg (121 lb 3.2 oz)   Height: 1.626 m (5' 4)     Physical Exam  Vitals and nursing note reviewed.   Constitutional:       General: She is not in acute distress.     Appearance: Normal appearance.   HENT:      Head: Normocephalic and atraumatic.   Eyes:      Conjunctiva/sclera: Conjunctivae normal.   Cardiovascular:      Rate and Rhythm: Normal rate and regular rhythm.      Heart sounds: Normal heart sounds.   Pulmonary:      Effort: Pulmonary effort is normal. No respiratory distress.      Breath sounds: Normal breath sounds.   Abdominal:      General: Bowel  sounds are normal. There is no distension.      Palpations: Abdomen is soft.      Tenderness: There is no abdominal tenderness.   Musculoskeletal:      Lumbar back: Tenderness present.   Skin:     General: Skin  is warm and dry.   Neurological:      General: No focal deficit present.      Mental Status: She is alert and oriented to person, place, and time.   Psychiatric:         Mood and Affect: Mood normal.         Assessment:      1. Memory problem  2. Acute right-sided low back pain without sciatica  -     gabapentin  (NEURONTIN ) 100 MG capsule; Take 1 capsule by mouth at bedtime for 90 days., Disp-90 capsule, R-0Normal  -     traMADol  (ULTRAM ) 50 MG tablet; Take 0.5-1 tablets by mouth 2 times daily as needed for Pain for up to 15 days. Take lowest dose possible to manage pain Max Daily Amount: 100 mg, Disp-30 tablet, R-0Normal  -     predniSONE  (DELTASONE ) 20 MG tablet; Take 2 tabs by mouth daily x 5 days, then 1 tab daily x 3 days., Disp-13 tablet, R-0Normal  3. Anxiety         Plan:      Assessment & Plan          Return in about 4 months (around 05/09/2024) for f/u meds, back pain, HLD, labs.    Orders Placed This Encounter   Medications    gabapentin  (NEURONTIN ) 100 MG capsule     Sig: Take 1 capsule by mouth at bedtime for 90 days.     Dispense:  90 capsule     Refill:  0    traMADol  (ULTRAM ) 50 MG tablet     Sig: Take 0.5-1 tablets by mouth 2 times daily as needed for Pain for up to 15 days. Take lowest dose possible to manage pain Max Daily Amount: 100 mg     Dispense:  30 tablet     Refill:  0    predniSONE  (DELTASONE ) 20 MG tablet     Sig: Take 2 tabs by mouth daily x 5 days, then 1 tab daily x 3 days.     Dispense:  13 tablet     Refill:  0           Discussed use, benefit, and side effects of prescribed medications.  All patient questions answered.  Pt voiced understanding.  Reviewed health maintenance.    The patient (or guardian, if applicable) and other individuals in attendance with the patient were  advised that Artificial Intelligence will be utilized during this visit to record, process the conversation to generate a clinical note, and support improvement of the AI technology. The patient (or guardian, if applicable) and other individuals in attendance at the appointment consented to the use of AI, including the recording.            Electronically signed by Wonda MARLA Ka, DO, DO on 01/29/2024 at 11:55 PM

## 2023-12-29 ENCOUNTER — Telehealth: Admit: 2023-12-29 | Discharge: 2023-12-29 | Payer: MEDICARE | Attending: Family Medicine | Primary: Family Medicine

## 2023-12-29 DIAGNOSIS — Z0000 Encounter for general adult medical examination without abnormal findings: Principal | ICD-10-CM

## 2023-12-29 NOTE — Progress Notes (Signed)
 "  Medicare Annual Wellness Visit    Katie Juarez is here for Medicare AWV    Assessment & Plan   Medicare annual wellness visit, subsequent       No follow-ups on file.     Subjective     Patient's complete Health Risk Assessment and screening values have been reviewed and are found in Flowsheets. The following problems were reviewed today and where indicated follow up appointments were made and/or referrals ordered.    Positive Risk Factor Screenings with Interventions:          Controlled Medication Review:    Today's Pain Level: No data recorded   Opioid Risk: (Low risk score <55) Opioid risk score: 28    Patient is low risk for opioid use disorder or overdose.    Last PDMP [place] as Reviewed:  Review User Review Instant Review Result   [name] 12/27/2023 12:36 PM     Reviewed PDMP [1]     Last Controlled Substance Monitoring Documentation      Flowsheet Row Refill from 07/31/2023 in [facility] A department of [facility]   Periodic Controlled Substance Monitoring No signs of potential drug abuse or diversion identified. filed at 08/03/2023 1545   Chronic Pain > 80 MEDD Obtained or confirmed Medication Contract on file. filed at 08/03/2023 1545      Self-assessment of health:  In general, how would you say your health is?: (!) Poor    Interventions:  Patient declines any further evaluation or treatment.      Inactivity:  On average, how many days per week do you engage in moderate to strenuous exercise (like a brisk walk)?: 0 days (!) Abnormal  On average, how many minutes do you engage in exercise at this level?: 0 min  Interventions:  Patient declined any further interventions or treatment - cannot exercise due to back pain.       Hearing Screen:  Do you or your family notice any trouble with your hearing that hasn't been managed with hearing aids?: No    Interventions:  Patient comments: Patient wears hearing aids daily        ADL's:   Patient reports needing help  with:  Select all that apply: (!) Housekeeping, Shopping, Presenter, Broadcasting, Transportation  Interventions:  Patient comments: Daughter comes over to clean the house, take patient shopping, help make food, and will also transport patient where she needs to go.               Objective    Patient-Reported Vitals  No data recorded              Allergies   Allergen Reactions    Latex Other (See Comments)    Morphine Other (See Comments)     Night sweats,felt loopy.    Pcn [Penicillins] Hives    Zithromax [Azithromycin] Hives    Adhesive Tape Rash     Prior to Visit Medications   Medication Sig Taking? Authorizing Provider   gabapentin  (NEURONTIN ) 100 MG capsule Take 1 capsule by mouth at bedtime for 90 days.  [name], DO   traMADol  (ULTRAM ) 50 MG tablet Take 0.5-1 tablets by mouth 2 times daily as needed for Pain for up to 15 days. Take lowest dose possible to manage pain Max Daily Amount: 100 mg  [name], DO   predniSONE  (DELTASONE ) 20 MG tablet Take 2 tabs by mouth daily x 5 days,  then 1 tab daily x 3 days.  [name], DO   donepezil  (ARICEPT ) 10 MG tablet Take 1 tablet by mouth nightly  [name], DO   docusate sodium  (COLACE) 100 MG capsule Take 1 capsule by mouth 2 times daily as needed for Constipation  [name], DO   acetaminophen  (TYLENOL ) 325 MG tablet Take 1.5 tablets by mouth every 6 hours as needed for Pain  [name], DO   ibuprofen  (ADVIL ;MOTRIN ) 200 MG tablet Take 1 tablet by mouth every 6 hours as needed for Pain  Patient taking differently: Take 2 tablets by mouth 2 times daily  [provider]   atorvastatin  (LIPITOR ) 20 MG tablet Take 1 tablet by mouth daily  [name], DO   citalopram  (CELEXA ) 20 MG tablet Take 1 tablet by mouth daily  [name], DO   LORazepam  (ATIVAN ) 0.5 MG tablet Take 1 tablet by mouth nightly as needed for Anxiety for up to 90 days. Max Daily Amount: 0.5 mg  [name], DO   fluticasone  (ARNUITY ELLIPTA ) 100 MCG/ACT AEPB  Inhale 1 puff into the lungs daily  [name], DO   INCRUSE ELLIPTA  62.5 MCG/ACT inhaler Inhale 1 puff into the lungs daily  [name], DO   famotidine  (PEPCID ) 40 MG tablet Take 1 tablet by mouth 2 times daily  [name], DO   Handicap Placard MISC by Does not apply route Issue parking placard for person with disability; Applicant meets the qualifying disability criteria. Length of time expected to have disability__X___Lifetime.  [name], DO   latanoprost  (XALATAN ) 0.005 % ophthalmic solution   [provider]   polyethylene glycol (GLYCOLAX ) 17 GM/SCOOP powder Take 17 g by mouth daily As needed.  [provider]   Multiple Vitamins-Minerals (THERAPEUTIC MULTIVITAMIN-MINERALS) tablet Take 1 tablet by mouth daily  [provider]   polyvinyl alcohol-povidone (HYPOTEARS) 1.4-0.6 % ophthalmic solution Place 1-2 drops into both eyes as needed  [provider]       CareTeam (Including outside providers/suppliers regularly involved in providing care):   Patient Care Team:  [name], DO as PCP - General (Family Medicine)  Netta, Wonda POUR, DO as PCP - Empaneled Provider  [name], MD as Consulting Physician (Orthopedic Surgery)     Recommendations for Preventive Services Due: see orders and patient instructions/AVS.  Recommended screening schedule for the next 5-10 years is provided to the patient in written form: see Patient Instructions/AVS.     Reviewed and updated this visit:  Tobacco  Allergies  Meds  Med Hx  Surg Hx  Fam Hx              [name], was evaluated through a synchronous (real-time) audio-video encounter. The patient (or guardian if applicable) is aware that this is a billable service, which includes applicable co-pays. This Virtual Visit was conducted with patient's (and/or legal guardian's) consent. Patient identification was verified, and a caregiver was present when appropriate.     Katie Juarez, was evaluated  through a synchronous (real-time) audio-video encounter. The patient (or guardian if applicable) is aware that this is a billable service, which includes applicable co-pays. This Virtual Visit was conducted with patient's (and/or legal guardian's) consent. Patient identification was verified, and a caregiver was present when appropriate.   The patient was located at Home: [address]  [place] [address]  Provider was located at Facility (Appt Dept): [address]  [place],  [place] [address]  Confirm you are appropriately licensed, registered, or certified to deliver care in the state where the patient is located as indicated above. If you are not or unsure, please re-schedule the visit: Yes, I confirm.      Total time spent for this encounter: 10 minutes.       This encounter was performed under my, Wonda MARLA Ka, DOs, direct supervision, 12/29/2023.      --Wonda MARLA Ka, DO on 01/02/2024 at 2:05 AM    An electronic signature was used to authenticate this note.      "

## 2023-12-29 NOTE — Patient Instructions (Signed)
 "     Learning About Emotional Support  When do you need emotional support?     You might find getting support from others helpful when you have a long-term health problem. Often people feel alone, confused, or scared when coping with an illness. But you aren't alone. Other people are going through the same thing you are and know how you feel.  Talking with others about your feelings can help you feel better.  Your family and friends can give you support. So can your doctor, a support group, or a church. If you have a support network, you will not feel as alone. You will learn new ways to deal with your situation, and you may try harder to overcome it.  Where you can get support  Family and friends: They can help you cope by giving you comfort and encouragement.  Counseling: Professional counseling can help you cope with situations that interfere with your life and cause stress. Counseling can help you understand and deal with your illness.  Your doctor: Find a doctor you trust and feel comfortable with. Be open and honest about your fears and concerns. Your doctor can help you get the right medical treatments, including counseling.  Spiritual or religious groups: They can provide comfort and may be able to help you find counseling or other social support services.  Social groups: They can help you meet new people and get involved in activities you enjoy.  Community support groups: In a support group, you can talk to others who have dealt with the same problems or illness as you. You can encourage one another and learn ways to cope with tough emotions.  How can you find a support group?  Finding a support group that works for you may take time. There are many options. Some groups have a group leader who helps lead discussions or shares information. Others are less formal. Some meet in person, while others meet online.  Try using these resources to help you find the best support group for you.  Your doctor, health  care team, or counselor.  People with the same health concern.  Your local church, mosque, synagogue, or other religious group.  A city, state, or national group that provides support for your health concern. Check your local library or community center for a list of these groups. Or look for information online.  Your local community, friends, and family.  Supportive relationships  A supportive relationship includes emotional support such as love, trust, and understanding, as well as advice and concrete help, such as help managing your time.  Reach out to others  Family and friends can help you. Ask them to:  Listen to you and give you encouragement. This can keep you from feeling hopeless or alone.  Help with small daily tasks or with bigger problems. A helping hand can keep you from feeling overwhelmed.  Help you manage a health problem. For example, ask them to go to doctor visits with you. Your loved ones can offer support by being involved in your medical care.  Respect your relationships  A good relationship is also a two-way street. You count on help from others, but they also count on you.  Know your friends' limits. You don't have to see or call your friends every day. If you are going through a rough patch, ask friends if you can contact them outside of the usual boundaries.  Don't always complain or talk about yourself. Know when it's time to  stop talking and listen or just enjoy your friend's company.  Know that good friends can be a bad influence. For example, if a friend encourages you to drink when you know it will harm you, you may want to end the friendship.  Where can you learn more?  Go to [employer] and enter G092 to learn more about Learning About Emotional Support.  Current as of: August 10, 2022  Content Version: 14.6   2024-2025 [place], [place].   Care instructions adapted under license by [facility]. If you have questions about a medical condition or this  instruction, always ask your healthcare professional. Romayne Alderman, [facility], disclaims any warranty or liability for your use of this information.         Learning About Being Active as an Older Adult  Why is being active important as you get older?     Being active is one of the best things you can do for your health. And it's never too late to start. Being active--or getting active, if you aren't already--has definite benefits. It can:  Give you more energy,  Keep your mind sharp.  Improve balance to reduce your risk of falls.  Help you manage chronic illness with fewer medicines.  No matter how old you are, how fit you are, or what health problems you have, there is a form of activity that will work for you. And the more physical activity you can do, the better your overall health will be.  What kinds of activity can help you stay healthy?  Being more active will make your daily activities easier. Physical activity includes planned exercise and things you do in daily life. There are four types of activity:  Aerobic.  Doing aerobic activity makes your heart and lungs strong.  Includes walking, dancing, and gardening.  Aim for at least 2 hours spread throughout the week.  It improves your energy and can help you sleep better.  Muscle-strengthening.  This type of activity can help maintain muscle and strengthen bones.  Includes climbing stairs, using resistance bands, and lifting or carrying heavy loads.  Aim for at least twice a week.  It can help protect the knees and other joints.  Stretching.  Stretching gives you better range of motion in joints and muscles.  Includes upper arm stretches, calf stretches, and gentle yoga.  Aim for at least twice a week, preferably after your muscles are warmed up from other activities.  It can help you function better in daily life.  Balancing.  This helps you stay coordinated and have good posture.  Includes heel-to-toe walking, tai chi, and certain types of yoga.  Aim for at  least 3 days a week.  It can reduce your risk of falling.  Even if you have a hard time meeting the recommendations, it's better to be more active than less active. All activity done in each category counts toward your weekly total. You'd be surprised how daily things like carrying groceries, keeping up with grandchildren, and taking the stairs can add up.  What keeps you from being active?  If you've had a hard time being more active, you're not alone. Maybe you remember being able to do more. Or maybe you've never thought of yourself as being active. It's frustrating when you can't do the things you want. Being more active can help. What's holding you back?  Getting started.  Have a goal, but break it into easy tasks. Small steps build into big accomplishments.  Staying motivated.  If you feel like skipping your activity, remember your goal. Maybe you want to move better and stay independent. Every activity gets you one step closer.  Not feeling your best.  Start with 5 minutes of an activity you enjoy. Prove to yourself you can do it. As you get comfortable, increase your time.  You may not be where you want to be. But you're in the process of getting there. Everyone starts somewhere.  How can you find safe ways to stay active?  Talk with your doctor about any physical challenges you're facing. Make a plan with your doctor if you have a health problem or aren't sure how to get started with activity.  If you're already active, ask your doctor if there is anything you should change to stay safe as your body and health change.  If you tend to feel dizzy after you take medicine, avoid activity at that time. Try being active before you take your medicine. This will reduce your risk of falls.  If you plan to be active at home, make sure to clear your space before you get started. Remove things like TV cords, coffee tables, and throw rugs. It's safest to have plenty of space to move freely.  The key to getting more  active is to take it slow and steady. Try to improve only a little bit at a time. Pick just one area to improve on at first. And if an activity hurts, stop and talk to your doctor.  Where can you learn more?  Go to [employer] and enter P600 to learn more about Learning About Being Active as an Older Adult.  Current as of: August 10, 2022  Content Version: 14.6   2024-2025 [place], [place].   Care instructions adapted under license by [facility]. If you have questions about a medical condition or this instruction, always ask your healthcare professional. Romayne Alderman, [facility], disclaims any warranty or liability for your use of this information.         Hearing Loss: Care Instructions  Overview     Hearing loss is a sudden or slow decrease in how well you hear. It can range from slight to profound. Permanent hearing loss can occur with aging. It also can happen when you are exposed long-term to loud noise. Examples include listening to loud music, riding motorcycles, or being around other loud machines.  Hearing loss can affect your work and home life. It can make you feel lonely or depressed. You may feel that you have lost your independence. But hearing aids and other devices can help you hear better and feel connected to others.  Follow-up care is a key part of your treatment and safety. Be sure to make and go to all appointments, and call your doctor if you are having problems. It's also a good idea to know your test results and keep a list of the medicines you take.  How can you care for yourself at home?  Avoid loud noises whenever possible. This helps keep your hearing from getting worse.  Always wear hearing protection around loud noises.  Wear a hearing aid as directed.  A professional can help you pick a hearing aid that will work best for you.  You can also get hearing aids over the counter for mild to moderate hearing loss.  Have hearing tests as your doctor suggests.  They can show whether your hearing has changed. Your hearing aid may need to be adjusted.  Use other devices as needed. These may include:  Telephone amplifiers and hearing aids that can connect to a television, stereo, radio, or microphone.  Devices that use lights or vibrations. These alert you to the doorbell, a ringing telephone, or a baby monitor.  Television closed-captioning. This shows the words at the bottom of the screen. Most new TVs can do this.  TTY (text telephone). This lets you type messages back and forth on the telephone instead of talking or listening. These devices are also called TDD. When messages are typed on the keyboard, they are sent over the phone line to a receiving TTY. The message is shown on a monitor.  Use text messaging, social media, and email if it is hard for you to communicate by telephone.  Try to learn a listening technique called speechreading. It is not lipreading. You pay attention to people's gestures, expressions, posture, and tone of voice. These clues can help you understand what a person is saying. Face the person you are talking to, and have them face you. Make sure the lighting is good. You need to see the other person's face clearly.  Think about counseling if you need help to adjust to your hearing loss.  When should you call for help?  Watch closely for changes in your health, and be sure to contact your doctor if:    You think your hearing is getting worse.     You have new symptoms, such as dizziness or nausea.   Where can you learn more?  Go to [employer] and enter R798 to learn more about Hearing Loss: Care Instructions.  Current as of: November 06, 2022  Content Version: 14.6   2024-2025 [place], [place].   Care instructions adapted under license by [facility]. If you have questions about a medical condition or this instruction, always ask your healthcare professional. Romayne Alderman, [facility], disclaims any warranty or  liability for your use of this information.         Learning About Activities of Daily Living  What are activities of daily living?     Activities of daily living (ADLs) are the basic self-care tasks you do every day. These include eating, bathing, dressing, and moving around.  As you age, and if you have health problems, you may find that it's harder to do some of these tasks. If so, your doctor can suggest ideas that may help.  To measure what kind of help you may need, your doctor will ask how well you are able to do ADLs. Let your doctor know if there are any tasks that you are having trouble doing. This is an important first step to getting help. And when you have the help you need, you can stay as independent as possible.  How will a doctor assess your ADLs?  Asking about ADLs is part of a routine health checkup your doctor will likely do as you age. Your health check might be done in a doctor's office, in your home, or at a hospital. The goal is to find out if you are having any problems that could make it hard to care for yourself or that make it unsafe for you to be on your own.  To measure your ADLs, your doctor will ask how hard it is for you to do routine tasks. Your doctor may also want to know if you have changed the way you do a task because of a health problem. Your doctor may watch how you:  Walk back and forth.  Keep your balance while you stand or walk.  Move from sitting to standing or from a bed to a chair.  Button or unbutton a civil service fast streamer.  Remove and put on your shoes.  It's common to feel a little worried or anxious if you find you can't do all the things you used to be able to do. Talking with your doctor about ADLs is a way to make sure you're as safe as possible and able to care for yourself as well as you can. You may want to bring a caregiver, friend, or family member to your checkup. They can help you talk to your doctor.  Follow-up care is a key part of your treatment and safety.  Be sure to make and go to all appointments, and call your doctor if you are having problems. It's also a good idea to know your test results and keep a list of the medicines you take.  Current as of: November 03, 2022  Content Version: 14.6   2024-2025 [place], [place].   Care instructions adapted under license by [facility]. If you have questions about a medical condition or this instruction, always ask your healthcare professional. Romayne Alderman, [facility], disclaims any warranty or liability for your use of this information.         A Healthy Heart: Care Instructions  Overview    Coronary artery disease, also called heart disease, occurs when a substance called plaque builds up in the vessels that supply oxygen-rich blood to your heart muscle. This can narrow the blood vessels and reduce blood flow. A heart attack happens when blood flow is completely blocked. A high-fat diet, smoking, and other factors increase the risk of heart disease.  Your doctor has found that you have a chance of having heart disease. A heart-healthy lifestyle can help keep your heart healthy and prevent heart disease. This lifestyle includes eating healthy, being active, staying at a weight that's healthy for you, and not smoking, vaping, or using other tobacco or nicotine  products. It also includes taking medicines as directed, managing other health conditions, and trying to get a healthy amount of sleep.  Follow-up care is a key part of your treatment and safety. Be sure to make and go to all appointments, and contact your doctor if you are having problems. It's also a good idea to know your test results and keep a list of the medicines you take.  How can you care for yourself at home?  Diet  Use less salt when you cook and eat. This helps lower your blood pressure. Taste food before salting. Add only a little salt when you think you need it. With time, your taste buds will adjust to less salt.  Eat fewer snack items, fast foods,  canned soups, and other high-salt, high-fat, processed foods.  Read food labels and try to avoid saturated and trans fats. They increase your risk of heart disease by raising cholesterol levels.  Limit the amount of solid fat--butter, margarine, and shortening--you eat. Use olive, peanut, or canola oil when you cook. Bake, broil, and steam foods instead of frying them.  Eat a variety of fruit and vegetables every day. Dark green, deep orange, red, or yellow fruits and vegetables are especially good for you. Examples include spinach, carrots, peaches, and berries.  Foods high in fiber can reduce your cholesterol and provide important vitamins and minerals. High-fiber foods include whole-grain cereals and breads, oatmeal, beans, brown rice,  citrus fruits, and apples.  Eat lean proteins. Heart-healthy proteins include seafood, lean meats and poultry, eggs, beans, peas, nuts, seeds, and soy products.  Limit drinks and foods with added sugar. These include candy, desserts, and soda pop.  Heart-healthy lifestyle  If your doctor recommends it, get more exercise. For many people, walking is a good choice. Or you may want to swim, bike, or do other activities. Bit by bit, increase the time you're active every day. Try for at least 30 minutes on most days of the week.  If you smoke, vape, or use other tobacco or nicotine  products, try to quit. If you cant quit, cut back as much as you can. If you need help quitting, talk to your doctor about quit programs and medicines. Quitting is one of the most important things you can do to protect your heart. Also avoid secondhand smoke and the aerosol mist from vaping.  Stay at a weight that's healthy for you. Talk to your doctor if you need help losing weight.  Try to get 7 to 9 hours of sleep each night.  Limit alcohol to 2 drinks a day for men and 1 drink a day for women. Too much alcohol can cause health problems.  Manage other health problems such as diabetes, high blood pressure,  and high cholesterol. If you think you may have a problem with alcohol or drug use, talk to your doctor.  Medicines  Take your medicines exactly as prescribed. Contact your doctor if you think you are having a problem with your medicine.  When should you call for help?  Call 911 if you have symptoms of a heart attack. These may include:  Chest pain or pressure, or a strange feeling in the chest.  Sweating.  Shortness of breath.  Pain, pressure, or a strange feeling in the back, neck, jaw, or upper belly or in one or both shoulders or arms.  Lightheadedness or sudden weakness.  A fast or irregular heartbeat.  After you call 911, the operator may tell you to chew 1 adult-strength or 2 to 4 low-dose aspirin . Wait for an ambulance. Do not try to drive yourself.  Watch closely for changes in your health, and be sure to contact your doctor if you have any problems.  Where can you learn more?  Go to [employer] and enter F075 to learn more about A Healthy Heart: Care Instructions.  Current as of: August 10, 2022  Content Version: 14.6   2024-2025 [place], [place].   Care instructions adapted under license by [facility]. If you have questions about a medical condition or this instruction, always ask your healthcare professional. Romayne Alderman, [facility], disclaims any warranty or liability for your use of this information.    Personalized Preventive Plan for Katie Juarez - 12/29/2023  Medicare offers a range of preventive health benefits. Some of the tests and screenings are paid in full while other may be subject to a deductible, co-insurance, and/or copay.  Some of these benefits include a comprehensive review of your medical history including lifestyle, illnesses that may run in your family, and various assessments and screenings as appropriate.  After reviewing your medical record and screening and assessments performed today your provider may have ordered immunizations, labs,  imaging, and/or referrals for you.  A list of these orders (if applicable) as well as your Preventive Care list are included within your After Visit Summary for your review.      "

## 2024-01-09 ENCOUNTER — Telehealth

## 2024-01-09 ENCOUNTER — Inpatient Hospital Stay: Payer: MEDICARE | Primary: Family Medicine

## 2024-01-09 ENCOUNTER — Telehealth: Admit: 2024-01-09 | Discharge: 2024-01-09 | Payer: MEDICARE | Attending: Family Medicine | Primary: Family Medicine

## 2024-01-09 DIAGNOSIS — R350 Frequency of micturition: Principal | ICD-10-CM

## 2024-01-09 DIAGNOSIS — N3001 Acute cystitis with hematuria: Principal | ICD-10-CM

## 2024-01-09 LAB — URINALYSIS WITH REFLEX TO CULTURE
Bilirubin, Urine: NEGATIVE
Glucose, Ur: NEGATIVE mg/dL
Nitrite, Urine: POSITIVE — AB
Specific Gravity, UA: 1.025 (ref 1.010–1.025)
Urobilinogen, Urine: NORMAL EU/dL (ref 0.0–1.0)
pH, Urine: 5.5 (ref 5.0–6.0)

## 2024-01-09 LAB — MICROSCOPIC URINALYSIS
Epithelial Cells, UA: 2 /HPF (ref 0–5)
RBC, UA: 2 /HPF (ref 0–4)
WBC, UA: 5 /HPF (ref 0–4)

## 2024-01-09 MED ORDER — CEPHALEXIN 500 MG PO CAPS
500 | ORAL_CAPSULE | Freq: Three times a day (TID) | ORAL | 0 refills | 7.00000 days | Status: AC
Start: 2024-01-09 — End: 2024-01-16

## 2024-01-09 NOTE — Progress Notes (Signed)
 Katie Juarez is a 83 y.o. female evaluated via telephone on 01/09/2024 for Urinary Urgency and Urinary Frequency.    Pt here today for UTI sx's.    Pt states she has been having urinary frequency and then only void a small amount x past 2-3 days; would still feel a bearing down pressure sensation even after she was done voiding.  Denies dysuria, hematuria, or urinary odor.    Has a h/o recurrent UTI's.      Review of Systems   Constitutional:  Negative for fever.   Genitourinary:  Negative for dysuria, flank pain and hematuria.           UA shows 1+ protein, + nitrites, 2+ LE, 5-10 WBC's, 2-5 RBC's and moderate bacteria - sent for culture.     Will start Keflex  500 mg TID x 7 days for presumed UTI; will await final culture results.          [name] was evaluated through a synchronous (real-time) audio encounter. Patient identification was verified at the start of the visit. She (or guardian if applicable) is aware that this is a billable service, which includes applicable co-pays. This visit was conducted with the patient's (and/or legal guardian's) verbal consent. She has not had a related appointment within my department in the past 7 days or scheduled within the next 24 hours.   The patient was located at Home: [address]  [place] [phone].  The provider was located at [facility] (Appt Dept): [address]  [place],  [place] [address].  Confirm you are appropriately licensed, registered, or certified to deliver care in the state where the patient is located as indicated above. If you are not or unsure, please re-schedule the visit: Yes, I confirm.     Note: not billable if this call serves to triage the patient into an appointment for the relevant concern          Wonda MARLA Ka, DO

## 2024-01-09 NOTE — Telephone Encounter (Signed)
"  Unable to reset MyChart password. Scheduled for VV today to discuss.   "

## 2024-01-09 NOTE — Telephone Encounter (Signed)
"  Patient's daughter is calling stating that they think Mom has a UTI and would like an order for a Urine to be sent to the lab. Please call and advise Kaylon at [phone]  "

## 2024-01-09 NOTE — Telephone Encounter (Signed)
"  Pt sx are urgency, frequency, feels like she needs to bear down. OK to order UA and culture per KB. Done now.    Pt and dtr are in waiting room. Writer will assist with submitting an eVisit for documentation.   "

## 2024-01-11 LAB — CULTURE, URINE

## 2024-02-06 ENCOUNTER — Encounter

## 2024-02-06 NOTE — Telephone Encounter (Signed)
 Please send to pended pharmacy in [place] 

## 2024-02-06 NOTE — Telephone Encounter (Signed)
 Per [place], last fill 10/24, quantity 90 for 90 days.     Amisadai called requesting a refill of the below medication which has been pended for you:     Requested Prescriptions     Pending Prescriptions Disp Refills    LORazepam  (ATIVAN ) 0.5 MG tablet 90 tablet 0     Sig: Take 1 tablet by mouth nightly as needed for Anxiety for up to 90 days. Max Daily Amount: 0.5 mg       Last Appointment Date: 01/09/2024  Next Appointment Date: 05/10/2024    Allergies   Allergen Reactions    Latex Other (See Comments)    Morphine Other (See Comments)     Night sweats,felt loopy.    Pcn [Penicillins] Hives    Zithromax [Azithromycin] Hives    Adhesive Tape Rash

## 2024-02-08 MED ORDER — LORAZEPAM 0.5 MG PO TABS
0.5 | ORAL_TABLET | Freq: Every evening | ORAL | 0 refills | Status: AC | PRN
Start: 2024-02-08 — End: 2024-05-08

## 2024-02-08 NOTE — Telephone Encounter (Signed)
 Controlled Substance Monitoring:    Acute and Chronic Pain Monitoring:   RX Monitoring Periodic Controlled Substance Monitoring   02/08/2024  11:26 AM No signs of potential drug abuse or diversion identified.
